# Patient Record
Sex: Male | Born: 1955 | Race: White | Hispanic: No | Smoking: Never smoker
Health system: Southern US, Community
[De-identification: ages and names within clinical notes are randomized; demographics above are authoritative.]

## PROBLEM LIST (undated history)

## (undated) DIAGNOSIS — N401 Enlarged prostate with lower urinary tract symptoms: Secondary | ICD-10-CM

## (undated) DIAGNOSIS — I2510 Atherosclerotic heart disease of native coronary artery without angina pectoris: Secondary | ICD-10-CM

## (undated) DIAGNOSIS — Z87898 Personal history of other specified conditions: Secondary | ICD-10-CM

## (undated) DIAGNOSIS — E119 Type 2 diabetes mellitus without complications: Secondary | ICD-10-CM

## (undated) DIAGNOSIS — I240 Acute coronary thrombosis not resulting in myocardial infarction: Secondary | ICD-10-CM

## (undated) DIAGNOSIS — E785 Hyperlipidemia, unspecified: Secondary | ICD-10-CM

## (undated) DIAGNOSIS — I3139 Other pericardial effusion (noninflammatory): Secondary | ICD-10-CM

## (undated) DIAGNOSIS — D649 Anemia, unspecified: Secondary | ICD-10-CM

## (undated) DIAGNOSIS — Z973 Presence of spectacles and contact lenses: Secondary | ICD-10-CM

## (undated) DIAGNOSIS — I2699 Other pulmonary embolism without acute cor pulmonale: Secondary | ICD-10-CM

## (undated) DIAGNOSIS — R351 Nocturia: Secondary | ICD-10-CM

## (undated) DIAGNOSIS — N189 Chronic kidney disease, unspecified: Secondary | ICD-10-CM

## (undated) DIAGNOSIS — I313 Pericardial effusion (noninflammatory): Secondary | ICD-10-CM

## (undated) DIAGNOSIS — I4891 Unspecified atrial fibrillation: Secondary | ICD-10-CM

## (undated) DIAGNOSIS — K900 Celiac disease: Secondary | ICD-10-CM

## (undated) DIAGNOSIS — I82409 Acute embolism and thrombosis of unspecified deep veins of unspecified lower extremity: Secondary | ICD-10-CM

## (undated) DIAGNOSIS — I10 Essential (primary) hypertension: Secondary | ICD-10-CM

## (undated) DIAGNOSIS — G4730 Sleep apnea, unspecified: Secondary | ICD-10-CM

## (undated) DIAGNOSIS — D689 Coagulation defect, unspecified: Secondary | ICD-10-CM

## (undated) DIAGNOSIS — N289 Disorder of kidney and ureter, unspecified: Secondary | ICD-10-CM

## (undated) DIAGNOSIS — K589 Irritable bowel syndrome without diarrhea: Secondary | ICD-10-CM

## (undated) DIAGNOSIS — E7800 Pure hypercholesterolemia, unspecified: Secondary | ICD-10-CM

## (undated) DIAGNOSIS — I480 Paroxysmal atrial fibrillation: Secondary | ICD-10-CM

## (undated) HISTORY — DX: Type 2 diabetes mellitus without complications: E11.9

## (undated) HISTORY — PX: BYPASS GRAFT: SHX909

## (undated) HISTORY — DX: Coagulation defect, unspecified: D68.9

## (undated) HISTORY — DX: Pure hypercholesterolemia, unspecified: E78.00

## (undated) HISTORY — PX: MELANOMA EXCISION: SHX5266

## (undated) HISTORY — DX: Essential (primary) hypertension: I10

## (undated) HISTORY — DX: Paroxysmal atrial fibrillation: I48.0

## (undated) HISTORY — DX: Anemia, unspecified: D64.9

## (undated) HISTORY — PX: HX HERNIA REPAIR: SHX51

## (undated) HISTORY — PX: HX LIPOMA RESECTION: SHX23

## (undated) HISTORY — DX: Pericardial effusion (noninflammatory): I31.3

## (undated) HISTORY — DX: Unspecified atrial fibrillation: I48.91

## (undated) HISTORY — PX: HX HEART CATHETERIZATION: SHX148

## (undated) HISTORY — DX: Benign prostatic hyperplasia with lower urinary tract symptoms: R35.1

## (undated) HISTORY — PX: HX CORONARY ARTERY BYPASS GRAFT: SHX141

## (undated) HISTORY — DX: Celiac disease: K90.0

## (undated) HISTORY — DX: Sleep apnea, unspecified: G47.30

## (undated) HISTORY — DX: Acute embolism and thrombosis of unspecified deep veins of unspecified lower extremity: I82.409

## (undated) HISTORY — DX: Other pericardial effusion (noninflammatory): I31.39

## (undated) HISTORY — DX: Hyperlipidemia, unspecified: E78.5

## (undated) HISTORY — DX: Other pulmonary embolism without acute cor pulmonale: I26.99

## (undated) HISTORY — DX: Chronic kidney disease, unspecified: N18.9

## (undated) HISTORY — DX: Nocturia: N40.1

## (undated) HISTORY — DX: Acute coronary thrombosis not resulting in myocardial infarction: I24.0

## (undated) HISTORY — PX: HX OTHER: 2100001105

## (undated) HISTORY — PX: HX GALL BLADDER SURGERY/CHOLE: SHX55

## (undated) HISTORY — PX: CORONARY ARTERY BYPASS GRAFT: SHX141

## (undated) HISTORY — PX: CHOLECYSTECTOMY: SHX55

## (undated) SURGERY — CORONARY ANGIOGRAPHY W/LEFT HEART CATH W/WO LVG

---

## 2009-04-21 ENCOUNTER — Other Ambulatory Visit: Payer: Self-pay

## 2009-04-22 LAB — HISTORICAL SURGICAL PATHOLOGY SPECIMEN

## 2011-07-22 ENCOUNTER — Ambulatory Visit (HOSPITAL_COMMUNITY): Payer: Self-pay | Admitting: Family Medicine

## 2011-07-22 ENCOUNTER — Other Ambulatory Visit: Payer: Self-pay

## 2011-07-26 LAB — HISTORICAL SURGICAL PATHOLOGY SPECIMEN

## 2012-10-10 ENCOUNTER — Other Ambulatory Visit: Payer: Self-pay

## 2012-10-13 LAB — HISTORICAL SURGICAL PATHOLOGY SPECIMEN

## 2013-06-21 HISTORY — PX: SKIN CANCER EXCISION: SHX779

## 2015-12-16 HISTORY — PX: HX HEART SURGERY: 2100001149

## 2015-12-26 DIAGNOSIS — I2510 Atherosclerotic heart disease of native coronary artery without angina pectoris: Secondary | ICD-10-CM | POA: Insufficient documentation

## 2015-12-26 DIAGNOSIS — I8290 Acute embolism and thrombosis of unspecified vein: Secondary | ICD-10-CM | POA: Insufficient documentation

## 2015-12-26 DIAGNOSIS — K900 Celiac disease: Secondary | ICD-10-CM | POA: Insufficient documentation

## 2015-12-30 DIAGNOSIS — G8918 Other acute postprocedural pain: Secondary | ICD-10-CM | POA: Insufficient documentation

## 2015-12-30 HISTORY — PX: CORONARY ARTERY BYPASS GRAFT: SHX141

## 2015-12-30 HISTORY — DX: Other acute postprocedural pain: G89.18

## 2015-12-31 DIAGNOSIS — J9811 Atelectasis: Secondary | ICD-10-CM

## 2015-12-31 HISTORY — DX: Atelectasis: J98.11

## 2016-01-02 DIAGNOSIS — M1A9XX0 Chronic gout, unspecified, without tophus (tophi): Secondary | ICD-10-CM | POA: Insufficient documentation

## 2016-01-02 DIAGNOSIS — N400 Enlarged prostate without lower urinary tract symptoms: Secondary | ICD-10-CM | POA: Insufficient documentation

## 2016-01-02 HISTORY — DX: Chronic gout, unspecified, without tophus (tophi): M1A.9XX0

## 2016-01-13 ENCOUNTER — Inpatient Hospital Stay (EMERGENCY_DEPARTMENT_HOSPITAL)
Admission: EM | Admit: 2016-01-13 | Discharge: 2016-01-13 | Disposition: A | Discharge status: Home / Self Care | Payer: BC Managed Care – PPO

## 2016-01-13 DIAGNOSIS — I10 Essential (primary) hypertension: Secondary | ICD-10-CM

## 2016-01-13 DIAGNOSIS — R42 Dizziness and giddiness: Secondary | ICD-10-CM

## 2016-01-13 DIAGNOSIS — E860 Dehydration: Secondary | ICD-10-CM

## 2016-01-13 HISTORY — PX: OTHER SURGICAL HISTORY: SHX170

## 2016-01-16 DIAGNOSIS — I313 Pericardial effusion (noninflammatory): Secondary | ICD-10-CM | POA: Insufficient documentation

## 2016-01-16 DIAGNOSIS — I3139 Other pericardial effusion (noninflammatory): Secondary | ICD-10-CM | POA: Insufficient documentation

## 2016-01-16 DIAGNOSIS — I10 Essential (primary) hypertension: Secondary | ICD-10-CM | POA: Insufficient documentation

## 2016-09-19 ENCOUNTER — Ambulatory Visit
Admission: RE | Admit: 2016-09-19 | Discharge: 2016-09-19 | Disposition: A | Discharge status: Home / Self Care | Payer: BC Managed Care – PPO | Source: Ambulatory Visit | Attending: Cardiovascular Disease | Admitting: Cardiovascular Disease

## 2016-09-19 ENCOUNTER — Ambulatory Visit (HOSPITAL_BASED_OUTPATIENT_CLINIC_OR_DEPARTMENT_OTHER): Payer: BC Managed Care – PPO

## 2016-09-19 ENCOUNTER — Ambulatory Visit (INDEPENDENT_AMBULATORY_CARE_PROVIDER_SITE_OTHER): Payer: Self-pay | Admitting: Cardiovascular Disease

## 2016-09-19 DIAGNOSIS — R0602 Shortness of breath: Secondary | ICD-10-CM | POA: Insufficient documentation

## 2016-09-19 DIAGNOSIS — R5383 Other fatigue: Secondary | ICD-10-CM

## 2016-09-19 DIAGNOSIS — Z86711 Personal history of pulmonary embolism: Secondary | ICD-10-CM

## 2016-09-19 DIAGNOSIS — E611 Iron deficiency: Secondary | ICD-10-CM

## 2016-09-19 LAB — CBC
HCT: 44.5 % (ref 42.0–52.0)
HGB: 14.9 g/dL (ref 14.0–18.0)
MCH: 27.8 pg (ref 27.0–31.0)
MCHC: 33.5 g/dL (ref 33.0–37.0)
MCV: 83 fL (ref 80.0–94.0)
PLATELETS: 167 x10ˆ3/uL (ref 130–400)
RBC: 5.37 10*6/uL (ref 4.70–6.10)
RDW: 20.5 % — ABNORMAL HIGH (ref 11.5–14.5)
WBC: 5.6 x10ˆ3/uL (ref 4.8–10.8)

## 2016-09-19 LAB — COMPREHENSIVE METABOLIC PANEL, NON-FASTING
ALBUMIN: 4.3 g/dL (ref 3.6–4.8)
ALKALINE PHOSPHATASE: 59 U/L (ref 38–126)
ALT (SGPT): 45 U/L (ref 3–45)
ANION GAP: 12 mmol/L
AST (SGOT): 31 U/L (ref 7–56)
AST (SGOT): 31 U/L (ref 7–56)
BILIRUBIN TOTAL: <0.1 mg/dL — ABNORMAL LOW (ref 0.2–1.3)
BUN/CREA RATIO: 13
BUN: 18 mg/dL (ref 9–21)
CALCIUM: 9.7 mg/dL (ref 8.5–10.3)
CHLORIDE: 107 mmol/L (ref 101–111)
CO2 TOTAL: 26 mmol/L (ref 22–31)
CREATININE: 1.4 mg/dL (ref 0.70–1.40)
ESTIMATED GFR: 52 mL/min/1.73mˆ2 — ABNORMAL LOW (ref >=60)
GLUCOSE: 93 mg/dL (ref 68–99)
POTASSIUM: 4.8 mmol/L (ref 3.6–5.0)
PROTEIN TOTAL: 6.9 g/dL (ref 6.2–8.0)
SODIUM: 145 mmol/L (ref 137–145)

## 2016-09-19 LAB — IRON: IRON: 84 ug/dL (ref 50–181)

## 2016-09-19 LAB — D-DIMER: D-DIMER: >200 ng/mL DDU

## 2016-09-19 LAB — MAGNESIUM: MAGNESIUM: 1.9 mg/dL (ref 1.7–2.2)

## 2016-09-19 NOTE — Telephone Encounter (Signed)
Spouse called states pt has increased shortness of breath, fatigue, and exhaustion. He mowed the neighbor's yard and could not finish. That is new for him.  I spoke with Dr Cornelia Copa. He gave me orders for labs and chest x-ray. Spouse notified.

## 2016-09-20 ENCOUNTER — Ambulatory Visit (INDEPENDENT_AMBULATORY_CARE_PROVIDER_SITE_OTHER): Payer: BC Managed Care – PPO

## 2016-09-20 DIAGNOSIS — I2699 Other pulmonary embolism without acute cor pulmonale: Secondary | ICD-10-CM

## 2016-09-20 NOTE — Progress Notes (Signed)
[facility]  [address]  [place] [address]  Phone: [phone]  Fax: [phone]      Name: Dustin Webster  MRN: [id number]  DOB: Aug 20, 1956      Today's Date:09/20/2016    FlowSheet Documentation:    Anticoag Indication: Peripheral Artery embolism       Date INR drawn: 09/20/16   Current warfarin dosage: 10 mg qd x 8 mg  tu-th-sat       PT: XS   INR: 2.9   New warfarin dosage: SAME       Follow up/ Next visit: 1 MONTH   Initials: [facility]/LPN   Goal INR: 2.0 - 3.0                 Comments: LAB RESULTS REVIEWS WITH LEE ANN RN. [place], LPN

## 2016-10-20 ENCOUNTER — Ambulatory Visit (INDEPENDENT_AMBULATORY_CARE_PROVIDER_SITE_OTHER): Payer: BC Managed Care – PPO

## 2016-10-20 DIAGNOSIS — I739 Peripheral vascular disease, unspecified: Secondary | ICD-10-CM

## 2016-10-20 NOTE — Progress Notes (Signed)
[facility]  [address]  [place] [address]  Phone: [phone]  Fax: [phone]      Name: ARCHIE SHEA  MRN: [id number]  DOB: 1956-06-21      Today's Date:10/20/2016    FlowSheet Documentation:    Anticoag Indication: Peripheral Artery embolism       Date INR drawn: 10/20/16   Current warfarin dosage: 10 mg qd x 8 mg tu-th-sat           INR: 3.6   New warfarin dosage: 2 mg   Comments: WILL GO BACK TO 500 MG   Follow up/ Next visit: 3 weeks   Initials: [facility]/LPN   Goal INR: 2.0 -3.0                 Comments:       Vicente Serene, LPN

## 2016-11-10 ENCOUNTER — Ambulatory Visit (INDEPENDENT_AMBULATORY_CARE_PROVIDER_SITE_OTHER): Payer: BC Managed Care – PPO

## 2016-11-10 DIAGNOSIS — Z7901 Long term (current) use of anticoagulants: Secondary | ICD-10-CM

## 2016-11-10 DIAGNOSIS — I82629 Acute embolism and thrombosis of deep veins of unspecified upper extremity: Secondary | ICD-10-CM

## 2016-11-10 DIAGNOSIS — I8290 Acute embolism and thrombosis of unspecified vein: Secondary | ICD-10-CM

## 2016-12-12 ENCOUNTER — Ambulatory Visit (INDEPENDENT_AMBULATORY_CARE_PROVIDER_SITE_OTHER): Payer: BC Managed Care – PPO

## 2016-12-12 DIAGNOSIS — I739 Peripheral vascular disease, unspecified: Secondary | ICD-10-CM

## 2016-12-12 NOTE — Progress Notes (Signed)
[facility]  [address]  [place] [address]  Phone: [phone]  Fax: [phone]      Name: Dustin Webster  MRN: [id number]  DOB: 05-23-1956      Today's Date:12/12/2016    FlowSheet Documentation:    Anticoag Indication: Peripheral Artery embolism       Date INR drawn: 12/12/16   Current warfarin dosage: 10 MG QD X 8 MG TU-TH-SAT           INR: 3.0   New warfarin dosage: SAME       Follow up/ Next visit: 1 MONTH   Initials: [facility]/LPN   Goal INR: 4.6-9.9                 Comments:       Vicente Serene, LPN

## 2017-01-10 ENCOUNTER — Encounter (INDEPENDENT_AMBULATORY_CARE_PROVIDER_SITE_OTHER): Payer: Self-pay

## 2017-01-12 ENCOUNTER — Encounter (INDEPENDENT_AMBULATORY_CARE_PROVIDER_SITE_OTHER): Payer: BC Managed Care – PPO | Admitting: Cardiovascular Disease

## 2017-01-14 ENCOUNTER — Encounter (INDEPENDENT_AMBULATORY_CARE_PROVIDER_SITE_OTHER): Payer: Self-pay | Admitting: Cardiovascular Disease

## 2017-01-14 DIAGNOSIS — I2699 Other pulmonary embolism without acute cor pulmonale: Secondary | ICD-10-CM

## 2017-01-14 DIAGNOSIS — I2510 Atherosclerotic heart disease of native coronary artery without angina pectoris: Secondary | ICD-10-CM

## 2017-01-14 DIAGNOSIS — I313 Pericardial effusion (noninflammatory): Secondary | ICD-10-CM

## 2017-01-14 DIAGNOSIS — I4891 Unspecified atrial fibrillation: Secondary | ICD-10-CM

## 2017-01-14 DIAGNOSIS — I3139 Other pericardial effusion (noninflammatory): Secondary | ICD-10-CM

## 2017-01-14 DIAGNOSIS — I959 Hypotension, unspecified: Secondary | ICD-10-CM

## 2017-01-14 DIAGNOSIS — I10 Essential (primary) hypertension: Secondary | ICD-10-CM

## 2017-01-14 DIAGNOSIS — E782 Mixed hyperlipidemia: Secondary | ICD-10-CM | POA: Insufficient documentation

## 2017-01-16 ENCOUNTER — Other Ambulatory Visit (HOSPITAL_BASED_OUTPATIENT_CLINIC_OR_DEPARTMENT_OTHER): Payer: Self-pay | Admitting: UROLOGY

## 2017-01-16 ENCOUNTER — Encounter (INDEPENDENT_AMBULATORY_CARE_PROVIDER_SITE_OTHER): Payer: Self-pay | Admitting: Cardiovascular Disease

## 2017-01-16 ENCOUNTER — Ambulatory Visit (INDEPENDENT_AMBULATORY_CARE_PROVIDER_SITE_OTHER): Payer: BC Managed Care – PPO | Admitting: Cardiovascular Disease

## 2017-01-16 ENCOUNTER — Encounter (HOSPITAL_BASED_OUTPATIENT_CLINIC_OR_DEPARTMENT_OTHER): Payer: Self-pay | Admitting: UROLOGY

## 2017-01-16 ENCOUNTER — Other Ambulatory Visit (INDEPENDENT_AMBULATORY_CARE_PROVIDER_SITE_OTHER): Payer: Self-pay | Admitting: Cardiovascular Disease

## 2017-01-16 ENCOUNTER — Other Ambulatory Visit (INDEPENDENT_AMBULATORY_CARE_PROVIDER_SITE_OTHER): Payer: Self-pay

## 2017-01-16 VITALS — BP 122/90 | HR 72 | Resp 18 | Ht 75.0 in | Wt 245.0 lb

## 2017-01-16 DIAGNOSIS — I2699 Other pulmonary embolism without acute cor pulmonale: Secondary | ICD-10-CM

## 2017-01-16 DIAGNOSIS — Z6830 Body mass index (BMI) 30.0-30.9, adult: Secondary | ICD-10-CM

## 2017-01-16 DIAGNOSIS — I119 Hypertensive heart disease without heart failure: Secondary | ICD-10-CM

## 2017-01-16 DIAGNOSIS — I2510 Atherosclerotic heart disease of native coronary artery without angina pectoris: Secondary | ICD-10-CM

## 2017-01-16 DIAGNOSIS — N401 Enlarged prostate with lower urinary tract symptoms: Principal | ICD-10-CM

## 2017-01-16 DIAGNOSIS — E782 Mixed hyperlipidemia: Secondary | ICD-10-CM

## 2017-01-16 DIAGNOSIS — I10 Essential (primary) hypertension: Secondary | ICD-10-CM

## 2017-01-16 DIAGNOSIS — R351 Nocturia: Secondary | ICD-10-CM

## 2017-01-16 DIAGNOSIS — Z01818 Encounter for other preprocedural examination: Secondary | ICD-10-CM

## 2017-01-16 MED ORDER — AMLODIPINE 5 MG TABLET
5.0000 mg | ORAL_TABLET | Freq: Every day | ORAL | 4 refills | Status: DC
Start: 2017-01-16 — End: 2017-01-18

## 2017-01-16 NOTE — Progress Notes (Signed)
[facility]  [address]  [place] [address]  Phone: [phone]  Fax: [phone]    Encounter Date: 01/16/2017    Patient ID:  Dustin Webster  HUO:[id number]    DOB: [birth year]  Age: 61 y.o. male    Subjective:     Chief Complaint   Patient presents with   . Other     HPI:  History provided by patient.  This is a 61 year old white male who has a history of coronary artery disease status post coronary artery bypass grafting.  Following the surgery the patient was not doing well and had an echocardiogram in our office.  The patient was found to have a large hematoma that was impinging on his left atrium.  Patient was sent back to [facility] where he had surgery and had to have evacuation of the hematoma.  Patient's Coumadin was held at that time.  Patient then developed a pulmonary embolism.  Patient had an IVC filter placed by Dr. Hardie Lora.  The patient has been doing fairly well recently.  He does complain of increased fatigue.  He states this is actually been present since surgery was performed.  He does deny any recent angina, shortness of breath, palpitations, orthopnea, or PND.  Patient has declined statin therapy.  He states he may be open to injectable lipid-lowering agent.                                                              Current Outpatient Prescriptions   Medication Sig   . allopurinol (ZYLOPRIM) 100 mg Oral Tablet Once a day   . ascorbic acid, vitamin C, (VITAMIN C) 500 mg Oral Tablet 0.5 Tabs Twice daily   . aspirin (ECOTRIN) 81 mg Oral Tablet, Delayed Release (E.C.) Once a day   . Calcium Carbonate (OS-CAL) 650 mg calcium (1,625 mg) Oral Tablet Once a day   . ferrous sulfate (IRON) 325 mg (65 mg iron) Oral Tablet Twice daily   . KRILL OIL ORAL Once a day   . LUTEIN ORAL Once a day   . Magnesium 250 mg Oral Tablet Once a day   . metoprolol (LOPRESSOR) 25 mg Oral Tablet Twice daily   . multivitamin Oral Tablet Once a day   . pantoprazole (PROTONIX)  40 mg Oral Tablet, Delayed Release (E.C.) Once a day   . UBIDECARENONE (COQ-10 ORAL) Once a day   . warfarin (COUMADIN) 5 mg Oral Tablet Once a day As directed     Allergies   Allergen Reactions   . Flomax [Tamsulosin]    . Statins-Hmg-Coa Reductase Inhibitors      Past Medical History:   Diagnosis Date   . Anemia    . Atrial fibrillation ([place])    . Hypercholesterolemia    . Hypertension    . Pericardial effusion    . Pulmonary emboli [facility])                Family Medical History     None            Social History   Substance Use Topics   . Smoking status: Never Smoker   . Smokeless tobacco: Never Used   . Alcohol use Yes      Comment: rare  Review of Systems   Constitutional: Positive for fatigue. Negative for activity change, appetite change, chills and unexpected weight change.   HENT: Negative for ear pain, hearing loss and tinnitus.    Eyes: Negative for pain, redness and visual disturbance.   Respiratory: Negative for cough, chest tightness, shortness of breath, wheezing and stridor.    Cardiovascular: Negative for chest pain, palpitations and leg swelling.   Gastrointestinal: Negative for abdominal distention, abdominal pain, blood in stool, constipation, diarrhea, nausea and vomiting.   Endocrine: Negative for cold intolerance, heat intolerance, polydipsia, polyphagia and polyuria.   Genitourinary: Negative for difficulty urinating, dysuria, frequency and hematuria.   Musculoskeletal: Negative for arthralgias, back pain, myalgias and neck pain.   Neurological: Negative for dizziness, tremors, seizures, syncope, speech difficulty, light-headedness, numbness and headaches.   Psychiatric/Behavioral: Negative for agitation, confusion, dysphoric mood, hallucinations and sleep disturbance. The patient is not nervous/anxious and is not hyperactive.      Objective:   Vitals: BP 122/90 Comment: right  Pulse 72  Resp 18  Ht 1.905 m (6' 3" )  Wt 111.1 kg (245 lb)  BMI 30.62 kg/m2    Physical Exam    Constitutional: He is oriented to person, place, and time. He appears well-developed and well-nourished. No distress.   HENT:   Head: Normocephalic and atraumatic.   Right Ear: External ear normal.   Left Ear: External ear normal.   Eyes: Pupils are equal, round, and reactive to light. No scleral icterus.   Neck: Normal range of motion. Neck supple. No JVD present. No tracheal deviation present. No thyromegaly present.   Cardiovascular: Normal rate, regular rhythm, normal heart sounds and intact distal pulses.  Exam reveals no gallop and no friction rub.    No murmur heard.  Pulmonary/Chest: Effort normal and breath sounds normal. No stridor. No respiratory distress. He has no wheezes. He has no rales. He exhibits no tenderness.   Abdominal: Soft. He exhibits no distension and no mass. There is no tenderness. There is no rebound and no guarding.   Musculoskeletal: Normal range of motion. He exhibits no edema, tenderness or deformity.   Lymphadenopathy:     He has no cervical adenopathy.   Neurological: He is alert and oriented to person, place, and time. He has normal reflexes. No cranial nerve deficit. He exhibits normal muscle tone. Coordination normal.   Skin: Skin is warm and dry. No rash noted. He is not diaphoretic. No erythema. No pallor.   Psychiatric: He has a normal mood and affect.     Assessment & Plan:     ENCOUNTER DIAGNOSES     ICD-10-CM   1. Atherosclerosis of native coronary artery of native heart without angina pectoris I25.10   2. Essential hypertension I10   3. Mixed hyperlipidemia E78.2   4. Pulmonary emboli (HCC) I26.99       1.  Coronary artery disease status post coronary artery bypass graft:  The patient is doing well from this standpoint.  Patient does have a significant amount of fatigue which she has had present since surgery.  He was placed on beta-blocker at that time.  I am going to wean him off of his beta-blocker and we will place him on calcium channel blocker with Norvasc 5 mg  daily I have given him instructions on weaning off of the beta-blocker.  The patient declined statin therapy.  He states he may be open to a injectable lipid lowering agent if his insurance will pay for this.  Going  to have Leann look into this for him.    2.  History of pulmonary emboli:  Patient does have an IVC filter.  I discussed this with Dr. Hardie Lora.  Patient will have the IVC filter removed tomorrow.  The patient will hold his Coumadin tonight.  We have given him a box of Eliquis that he can take tomorrow evening for protection.  He will then take a booster dose of his Coumadin.    3.  Hyperlipidemia:  Again patient is open to possibly taking injectable lipid-lowering agent.  We are going to check with his insurance.    4.  Hypertension:  Patient will be weaned off of beta-blocker and we placed on Norvasc 5 mg daily.    We will see him back in 6 months sooner if needed.        Landry Corporal, MD

## 2017-01-17 ENCOUNTER — Encounter (HOSPITAL_COMMUNITY)
Admission: RE | Disposition: A | Discharge status: Home / Self Care | Payer: Self-pay | Source: Ambulatory Visit | Attending: Interventional Cardiology

## 2017-01-17 ENCOUNTER — Encounter (HOSPITAL_COMMUNITY): Payer: BC Managed Care – PPO | Admitting: Interventional Cardiology

## 2017-01-17 ENCOUNTER — Other Ambulatory Visit (INDEPENDENT_AMBULATORY_CARE_PROVIDER_SITE_OTHER): Payer: Self-pay

## 2017-01-17 ENCOUNTER — Inpatient Hospital Stay
Admission: RE | Admit: 2017-01-17 | Discharge: 2017-01-17 | Disposition: A | Discharge status: Home / Self Care | Payer: BC Managed Care – PPO | Source: Ambulatory Visit | Attending: Interventional Cardiology | Admitting: Interventional Cardiology

## 2017-01-17 DIAGNOSIS — Z95828 Presence of other vascular implants and grafts: Secondary | ICD-10-CM

## 2017-01-17 DIAGNOSIS — Z7982 Long term (current) use of aspirin: Secondary | ICD-10-CM | POA: Insufficient documentation

## 2017-01-17 DIAGNOSIS — I2782 Chronic pulmonary embolism: Secondary | ICD-10-CM

## 2017-01-17 DIAGNOSIS — I2510 Atherosclerotic heart disease of native coronary artery without angina pectoris: Secondary | ICD-10-CM | POA: Insufficient documentation

## 2017-01-17 DIAGNOSIS — Z951 Presence of aortocoronary bypass graft: Secondary | ICD-10-CM | POA: Insufficient documentation

## 2017-01-17 DIAGNOSIS — T790XXA Air embolism (traumatic), initial encounter: Secondary | ICD-10-CM

## 2017-01-17 DIAGNOSIS — E782 Mixed hyperlipidemia: Secondary | ICD-10-CM | POA: Insufficient documentation

## 2017-01-17 DIAGNOSIS — Z86711 Personal history of pulmonary embolism: Secondary | ICD-10-CM | POA: Insufficient documentation

## 2017-01-17 DIAGNOSIS — Z7901 Long term (current) use of anticoagulants: Secondary | ICD-10-CM | POA: Insufficient documentation

## 2017-01-17 DIAGNOSIS — I4891 Unspecified atrial fibrillation: Secondary | ICD-10-CM | POA: Insufficient documentation

## 2017-01-17 DIAGNOSIS — D649 Anemia, unspecified: Secondary | ICD-10-CM | POA: Insufficient documentation

## 2017-01-17 DIAGNOSIS — E119 Type 2 diabetes mellitus without complications: Secondary | ICD-10-CM | POA: Insufficient documentation

## 2017-01-17 DIAGNOSIS — Z4589 Encounter for adjustment and management of other implanted devices: Secondary | ICD-10-CM | POA: Insufficient documentation

## 2017-01-17 LAB — URINALYSIS, MACROSCOPIC
BILIRUBIN: NOT DETECTED mg/dL
BLOOD: NOT DETECTED mg/dL
GLUCOSE: NOT DETECTED mg/dL
KETONES: NOT DETECTED mg/dL
KETONES: NOT DETECTED mg/dL
LEUKOCYTES: NOT DETECTED WBCs/uL
NITRITE: NOT DETECTED
PH: 5 — ABNORMAL LOW (ref 5.0–8.0)
PROTEIN: NOT DETECTED mg/dL
SPECIFIC GRAVITY: 1.021 (ref 1.005–1.030)

## 2017-01-17 LAB — BASIC METABOLIC PANEL
ANION GAP: 11 mmol/L
BUN/CREA RATIO: 12
BUN: 13 mg/dL (ref 9–21)
CALCIUM: 9.9 mg/dL (ref 8.5–10.3)
CHLORIDE: 111 mmol/L (ref 101–111)
CO2 TOTAL: 24 mmol/L (ref 22–31)
CREATININE: 1.1 mg/dL (ref 0.70–1.40)
ESTIMATED GFR: 68 mL/min/1.73m?2 (ref >=60)
GLUCOSE: 114 mg/dL — ABNORMAL HIGH (ref 68–99)
POTASSIUM: 4.6 mmol/L (ref 3.6–5.0)
SODIUM: 146 mmol/L — ABNORMAL HIGH (ref 137–145)

## 2017-01-17 LAB — CBC
HCT: 47.7 % (ref 42.0–52.0)
HGB: 15.9 g/dL (ref 14.0–18.0)
MCH: 29.1 pg (ref 27.0–31.0)
MCHC: 33.3 g/dL (ref 33.0–37.0)
MCV: 87.1 fL (ref 80.0–94.0)
PLATELETS: 189 x10ˆ3/uL (ref 130–400)
RBC: 5.47 x10ˆ6/uL (ref 4.70–6.10)
RDW: 14.5 % (ref 11.5–14.5)
WBC: 6.8 x10ˆ3/uL (ref 4.8–10.8)

## 2017-01-17 LAB — PT/INR
INR: 1.58 (ref <=4.00)
PROTHROMBIN TIME: 18.2 s — ABNORMAL HIGH (ref 9.5–12.9)

## 2017-01-17 LAB — PTT (PARTIAL THROMBOPLASTIN TIME): APTT: 35.5 s (ref 26.0–39.0)

## 2017-01-17 SURGERY — CARD IVC FILTER REMOVAL

## 2017-01-17 MED ORDER — LIDOCAINE HCL 20 MG/ML (2 %) INJECTION SOLUTION
Freq: Once | INTRAMUSCULAR | Status: DC | PRN
Start: 2017-01-17 — End: 2017-01-17
  Administered 2017-01-17 (×2): 20 mL via INTRADERMAL

## 2017-01-17 MED ORDER — MIDAZOLAM 1 MG/ML INJECTION SOLUTION
INTRAMUSCULAR | Status: AC
Start: 2017-01-17 — End: 2017-01-17
  Filled 2017-01-17: qty 2

## 2017-01-17 MED ORDER — MIDAZOLAM 1 MG/ML INJECTION SOLUTION
Freq: Once | INTRAMUSCULAR | Status: DC | PRN
Start: 2017-01-17 — End: 2017-01-17
  Administered 2017-01-17: 1 mg via INTRAVENOUS
  Administered 2017-01-17: 2 mg via INTRAVENOUS

## 2017-01-17 MED ORDER — LIDOCAINE HCL 20 MG/ML (2 %) INJECTION SOLUTION
INTRAMUSCULAR | Status: AC
Start: 2017-01-17 — End: 2017-01-17
  Filled 2017-01-17: qty 20

## 2017-01-17 MED ORDER — HEPARIN (PORCINE) (PF) 2,000 UNIT/1,000 ML IN 0.9 % SODIUM CHLORIDE IV
Freq: Once | INTRAVENOUS | Status: DC | PRN
Start: 2017-01-17 — End: 2017-01-17
  Administered 2017-01-17: 2000 [IU]

## 2017-01-17 MED ORDER — SODIUM CHLORIDE 0.9 % INTRAVENOUS SOLUTION
INTRAVENOUS | Status: DC | PRN
Start: 2017-01-17 — End: 2017-01-17
  Administered 2017-01-17: 75 mL/h via INTRAVENOUS

## 2017-01-17 MED ORDER — HEPARIN (PORCINE) (PF) 2,000 UNIT/1,000 ML IN 0.9 % SODIUM CHLORIDE IV
INTRAVENOUS | Status: AC
Start: 2017-01-17 — End: 2017-01-17
  Filled 2017-01-17: qty 1000

## 2017-01-17 MED ORDER — IOPAMIDOL 370 MG IODINE/ML (76 %) INTRAVENOUS SOLUTION
Freq: Once | INTRAVENOUS | Status: DC | PRN
Start: 2017-01-17 — End: 2017-01-17
  Administered 2017-01-17: 11:00:00 15 mL

## 2017-01-17 SURGICAL SUPPLY — 19 items
20MM SNARE LOOP ×2 IMPLANT
CONV USE ITEM 338050 - PACK SURG CSTM CTH LB LF (CUSTOM TRAYS & PACK) ×1
GW INQWR .035IN 150CM FIX COR FINGER STRG PTFE VAS 3MM RAD STD J STRL LF (WIRE) ×1 IMPLANT
GW INQWR .035IN 150CM FIX COR_NGR STRG STD SHFT PTFE VAS 3 (WIRE) ×1
KIT SNARE BARD 20MM 2 SHEATH RTRVL KINK RST RADOPQ NITINOL (BALLOON) ×1 IMPLANT
KIT SNARE BARD 20MM 2 SHTH RTR_VL KINK RST RADOPQ NITINOL (BALLOON) ×1
NEEDLE 7CM 18GA WO BASEPLATE P_ROC PERC DISP TW VAS ACCESS (NEEDLES & SYRINGE SUPPLIES) ×2 IMPLANT
PACK HEART CATH_DYNJ38683A 6EA/CS (CUSTOM TRAYS & PACK) ×2
PACK SURG CSTM CTH LB LF (CUSTOM TRAYS & PACK) ×1 IMPLANT
SHEATH GD 10CM 8FR PINN .038IN COR SS SNAPON DIL LOCK KINK RST SMOOTH TRNS SPRG COIL GW 2.5CM (GUIDING) ×1 IMPLANT
SHEATH INTROD 10CM 6FR PINN COR SS HDRPH PTFE SNAPON DIL LOCK KINK RST SMOOTH TRNS 2.5CM ACPT .038IN (GUIDING) ×1 IMPLANT
SHEATH SUPER 6FR 11CM .035_RSS602 10EA/BX (GUIDING) ×1
SHEATH SUPER 8FR 11CM .035_RSS802 10EA/BX (GUIDING) ×1
SHIELD RAD 17X12IN X-DRP .25MM ABS XRY CTOUT LEADFREE OVRWRAP 35X35IN STRL DISP (PROTECTIVE PRODUCTS/GARMENTS) ×1 IMPLANT
SHIELD RAD 17X12IN X-DRP .25MM_ABS XRY CTOUT LEADFREE (PROTECTIVE PRODUCTS/GARMENTS) ×1
SOL IV 0.9% NACL 500ML PLASTIC CONTAINR VIAFLEX LF (SOLUTIONS) ×1 IMPLANT
SOLUTION IV NS INJ 500CC_2B1323Q 24/CS (SOLUTIONS) ×1
STOPCOCK ANGIO 400 PSI NAMIC 3W ROT ADPR LFT PORT STRL LF (WIRE) ×1 IMPLANT
STOPCOCK ANGIO 400 PSI NAMIC 3_W ROT ADPR LFT PORT STRL (WIRE) ×1

## 2017-01-17 NOTE — Nurses Notes (Signed)
Patient received back to cath lab holding; dressing to right side of neck intact and free of signs of bleeding; vital signs stable; patient drinking fluids; no c/o pain/needs at this time; will continue to monitor

## 2017-01-17 NOTE — Nurses Notes (Signed)
Blood drawn and collected, sent to Lab.

## 2017-01-17 NOTE — Nurses Notes (Signed)
Patient's IV removed and patient walked out to car by Ephriam Jenkins, RN  01/17/2017, 12:06

## 2017-01-17 NOTE — Nurses Notes (Signed)
Patient given discharge instructions by K Zaccheus Edmister RN.

## 2017-01-17 NOTE — Nurses Notes (Signed)
Pulses bilateral DP & PT 2+

## 2017-01-18 MED ORDER — AMLODIPINE 5 MG TABLET
5.0000 mg | ORAL_TABLET | Freq: Every day | ORAL | 4 refills | Status: DC
Start: 2017-01-18 — End: 2017-01-20

## 2017-01-20 ENCOUNTER — Other Ambulatory Visit (INDEPENDENT_AMBULATORY_CARE_PROVIDER_SITE_OTHER): Payer: Self-pay

## 2017-01-20 MED ORDER — AMLODIPINE 5 MG TABLET
5.0000 mg | ORAL_TABLET | Freq: Every day | ORAL | 3 refills | Status: DC
Start: 2017-01-20 — End: 2017-02-20

## 2017-01-23 ENCOUNTER — Ambulatory Visit (INDEPENDENT_AMBULATORY_CARE_PROVIDER_SITE_OTHER): Payer: BC Managed Care – PPO

## 2017-01-23 DIAGNOSIS — Z7901 Long term (current) use of anticoagulants: Secondary | ICD-10-CM

## 2017-01-23 DIAGNOSIS — I2699 Other pulmonary embolism without acute cor pulmonale: Secondary | ICD-10-CM

## 2017-01-24 ENCOUNTER — Encounter (INDEPENDENT_AMBULATORY_CARE_PROVIDER_SITE_OTHER): Payer: BC Managed Care – PPO

## 2017-01-26 ENCOUNTER — Ambulatory Visit (INDEPENDENT_AMBULATORY_CARE_PROVIDER_SITE_OTHER): Payer: Self-pay

## 2017-01-26 NOTE — Telephone Encounter (Signed)
Faxed records to company

## 2017-01-30 ENCOUNTER — Ambulatory Visit (INDEPENDENT_AMBULATORY_CARE_PROVIDER_SITE_OTHER): Payer: BC Managed Care – PPO

## 2017-01-30 DIAGNOSIS — Z7901 Long term (current) use of anticoagulants: Secondary | ICD-10-CM

## 2017-01-30 DIAGNOSIS — I739 Peripheral vascular disease, unspecified: Secondary | ICD-10-CM

## 2017-01-31 ENCOUNTER — Encounter (HOSPITAL_BASED_OUTPATIENT_CLINIC_OR_DEPARTMENT_OTHER): Payer: Self-pay | Admitting: UROLOGY

## 2017-02-20 ENCOUNTER — Ambulatory Visit (INDEPENDENT_AMBULATORY_CARE_PROVIDER_SITE_OTHER): Payer: Self-pay

## 2017-02-20 DIAGNOSIS — I10 Essential (primary) hypertension: Secondary | ICD-10-CM

## 2017-02-20 NOTE — Telephone Encounter (Signed)
Pt called states b/p is higher since changing medication.  132/90  138/98  140/87.  I spoke with Dr Cornelia Copa. Increase Norvasc to 10 mg daily.  Verbalized understanding.

## 2017-02-22 ENCOUNTER — Other Ambulatory Visit (INDEPENDENT_AMBULATORY_CARE_PROVIDER_SITE_OTHER): Payer: Self-pay

## 2017-02-22 DIAGNOSIS — I10 Essential (primary) hypertension: Secondary | ICD-10-CM

## 2017-02-22 MED ORDER — AMLODIPINE 10 MG TABLET
10.0000 mg | ORAL_TABLET | Freq: Every day | ORAL | 4 refills | Status: DC
Start: 2017-02-22 — End: 2018-05-11

## 2017-02-23 ENCOUNTER — Ambulatory Visit (INDEPENDENT_AMBULATORY_CARE_PROVIDER_SITE_OTHER): Payer: BC Managed Care – PPO

## 2017-02-23 DIAGNOSIS — Z7901 Long term (current) use of anticoagulants: Secondary | ICD-10-CM

## 2017-02-23 DIAGNOSIS — I2699 Other pulmonary embolism without acute cor pulmonale: Secondary | ICD-10-CM

## 2017-02-24 ENCOUNTER — Encounter (INDEPENDENT_AMBULATORY_CARE_PROVIDER_SITE_OTHER): Payer: BC Managed Care – PPO

## 2017-03-13 ENCOUNTER — Ambulatory Visit (INDEPENDENT_AMBULATORY_CARE_PROVIDER_SITE_OTHER): Payer: BC Managed Care – PPO

## 2017-03-13 DIAGNOSIS — Z7901 Long term (current) use of anticoagulants: Secondary | ICD-10-CM

## 2017-03-13 DIAGNOSIS — I2699 Other pulmonary embolism without acute cor pulmonale: Secondary | ICD-10-CM

## 2017-04-03 ENCOUNTER — Ambulatory Visit (INDEPENDENT_AMBULATORY_CARE_PROVIDER_SITE_OTHER): Payer: BC Managed Care – PPO

## 2017-04-03 DIAGNOSIS — I2699 Other pulmonary embolism without acute cor pulmonale: Secondary | ICD-10-CM

## 2017-04-03 DIAGNOSIS — Z7901 Long term (current) use of anticoagulants: Secondary | ICD-10-CM

## 2017-05-04 ENCOUNTER — Ambulatory Visit (INDEPENDENT_AMBULATORY_CARE_PROVIDER_SITE_OTHER): Payer: BC Managed Care – PPO

## 2017-05-04 DIAGNOSIS — I2699 Other pulmonary embolism without acute cor pulmonale: Secondary | ICD-10-CM

## 2017-05-04 DIAGNOSIS — Z7901 Long term (current) use of anticoagulants: Secondary | ICD-10-CM

## 2017-05-19 ENCOUNTER — Ambulatory Visit (INDEPENDENT_AMBULATORY_CARE_PROVIDER_SITE_OTHER): Payer: BC Managed Care – PPO

## 2017-05-19 DIAGNOSIS — I2699 Other pulmonary embolism without acute cor pulmonale: Secondary | ICD-10-CM

## 2017-05-19 DIAGNOSIS — Z7901 Long term (current) use of anticoagulants: Secondary | ICD-10-CM

## 2017-05-26 ENCOUNTER — Ambulatory Visit (INDEPENDENT_AMBULATORY_CARE_PROVIDER_SITE_OTHER): Payer: BC Managed Care – PPO

## 2017-05-26 DIAGNOSIS — Z7901 Long term (current) use of anticoagulants: Secondary | ICD-10-CM

## 2017-05-26 DIAGNOSIS — I2699 Other pulmonary embolism without acute cor pulmonale: Secondary | ICD-10-CM

## 2017-06-16 ENCOUNTER — Encounter (INDEPENDENT_AMBULATORY_CARE_PROVIDER_SITE_OTHER): Payer: Self-pay

## 2017-06-21 ENCOUNTER — Encounter (INDEPENDENT_AMBULATORY_CARE_PROVIDER_SITE_OTHER): Payer: Self-pay

## 2017-06-26 ENCOUNTER — Ambulatory Visit (INDEPENDENT_AMBULATORY_CARE_PROVIDER_SITE_OTHER): Payer: BC Managed Care – PPO

## 2017-06-26 DIAGNOSIS — I2699 Other pulmonary embolism without acute cor pulmonale: Secondary | ICD-10-CM

## 2017-06-26 DIAGNOSIS — Z7901 Long term (current) use of anticoagulants: Secondary | ICD-10-CM

## 2017-07-21 ENCOUNTER — Encounter (INDEPENDENT_AMBULATORY_CARE_PROVIDER_SITE_OTHER): Payer: Self-pay | Admitting: Cardiovascular Disease

## 2017-08-09 ENCOUNTER — Encounter (INDEPENDENT_AMBULATORY_CARE_PROVIDER_SITE_OTHER): Payer: Self-pay | Admitting: Cardiovascular Disease

## 2017-08-10 ENCOUNTER — Ambulatory Visit (INDEPENDENT_AMBULATORY_CARE_PROVIDER_SITE_OTHER): Payer: BC Managed Care – PPO | Admitting: Cardiovascular Disease

## 2017-08-10 ENCOUNTER — Encounter (INDEPENDENT_AMBULATORY_CARE_PROVIDER_SITE_OTHER): Payer: Self-pay | Admitting: Cardiovascular Disease

## 2017-08-10 VITALS — BP 138/88 | HR 68 | Ht 74.6 in | Wt 234.2 lb

## 2017-08-10 DIAGNOSIS — I10 Essential (primary) hypertension: Secondary | ICD-10-CM

## 2017-08-10 DIAGNOSIS — E782 Mixed hyperlipidemia: Secondary | ICD-10-CM

## 2017-08-10 DIAGNOSIS — I2699 Other pulmonary embolism without acute cor pulmonale: Secondary | ICD-10-CM

## 2017-08-10 DIAGNOSIS — Z6829 Body mass index (BMI) 29.0-29.9, adult: Secondary | ICD-10-CM

## 2017-08-10 DIAGNOSIS — I2510 Atherosclerotic heart disease of native coronary artery without angina pectoris: Principal | ICD-10-CM

## 2017-08-10 MED ORDER — TAMSULOSIN 0.4 MG CAPSULE
0.4000 mg | ORAL_CAPSULE | Freq: Every evening | ORAL | 5 refills | Status: DC
Start: 2017-08-10 — End: 2017-08-11

## 2017-08-10 MED ORDER — FENOFIBRATE NANOCRYSTALLIZED 145 MG TABLET
145.0000 mg | ORAL_TABLET | Freq: Every morning | ORAL | 5 refills | Status: DC
Start: 2017-08-10 — End: 2017-08-11

## 2017-08-10 NOTE — Progress Notes (Signed)
[facility]  [place] [address]  Phone: [phone]  Fax: [phone]    Encounter Date: 08/10/2017    Patient ID:  Dustin Webster  HYI:[id number]    DOB: Mar 13, 1956  Age: 61 y.o. male    Subjective:     Chief Complaint   Patient presents with   . Annual Exam     HPI:  History provided by patient.  This is a 61 year old white male who I follow in the office.  Patient has a history of coronary artery disease status post coronary artery bypass grafting.  The patient following this had a complication of a hematoma impinging on the left atrium.  Patient was sent back to [facility] and had urgent surgery.  The patient's Coumadin was stopped and the patient developed a pulmonary embolism.  He did have a IVC filter placed.  This has since been removed and the patient is on Coumadin currently.  He does deny any bleeding with the Coumadin.  The patient is having a lot of issues with have any tamponade Miller night to urinate.  He was on Flomax prior to his surgical procedure was asking go back on this.  I had stopped his beta-blocker due to overwhelming fatigue any feels great improvement after stopping this.  The patient does deny any chest pain, shortness of breath, palpitations, orthopnea, or PND.      Current Outpatient Prescriptions   Medication Sig   . allopurinol (ZYLOPRIM) 100 mg Oral Tablet Once a day   . amLODIPine (NORVASC) 10 mg Oral Tablet Take 1 Tab (10 mg total) by mouth Once a day   . ascorbic acid, vitamin C, (VITAMIN C) 500 mg Oral Tablet 0.5 Tabs Twice daily   . aspirin (ECOTRIN) 81 mg Oral Tablet, Delayed Release (E.C.) Once a day   . Calcium Carbonate (OS-CAL) 650 mg calcium (1,625 mg) Oral Tablet Once a day   . ferrous sulfate (IRON) 325 mg (65 mg iron) Oral Tablet Twice daily   . Fluocinonide (LIDEX) 0.05 % Gel As directed   . hydrocortisone 2.5 % Cream As directed   . KRILL OIL ORAL Once a day   . LUTEIN ORAL Once a day   .  Magnesium 250 mg Oral Tablet Once a day   . melatonin 1 mg Oral Tablet As directed   . multivitamin Oral Tablet Once a day   . pantoprazole (PROTONIX) 40 mg Oral Tablet, Delayed Release (E.C.) Once a day   . UBIDECARENONE (COQ-10 ORAL) Once a day   . warfarin (COUMADIN) 5 mg Oral Tablet Once a day As directed     Allergies   Allergen Reactions   . Flomax [Tamsulosin]    . Statins-Hmg-Coa Reductase Inhibitors      Past Medical History:   Diagnosis Date   . Acute lower UTI    . Anemia    . Anemia    . Atrial fibrillation (CMS HCC)    . BPH associated with nocturia    . CKD (chronic kidney disease)    . DVT (deep venous thrombosis) (CMS HCC)    . Hypercholesterolemia    . Hyperlipidemia    . Hypertension    . Hypertrophy of prostate    . Nocturia    . Pericardial effusion    . Pulmonary emboli (CMS [facility])          Past Surgical History:   Procedure Laterality Date   . HX CHOLECYSTECTOMY     .  HX HEART SURGERY  12/2015   . HX HERNIA REPAIR           Family Medical History:     Problem Relation (Age of Onset)    Emphysema Father    Heart Attack General Family Hx    Heart Disease Mother    Hypertension Mother    Stroke Mother            Social History   Substance Use Topics   . Smoking status: Never Smoker   . Smokeless tobacco: Never Used   . Alcohol use Yes      Comment: rare       Review of Systems   Constitutional: Negative for activity change, appetite change, chills, fatigue and unexpected weight change.   HENT: Negative for ear pain, hearing loss and tinnitus.    Eyes: Negative for pain, redness and visual disturbance.   Respiratory: Negative for cough, chest tightness, shortness of breath, wheezing and stridor.    Cardiovascular: Negative for chest pain, palpitations and leg swelling.   Gastrointestinal: Negative for abdominal distention, abdominal pain, blood in stool, constipation, diarrhea, nausea and vomiting.   Endocrine: Positive for polyuria. Negative for cold intolerance, heat intolerance, polydipsia and  polyphagia.   Genitourinary: Negative for difficulty urinating, dysuria, frequency and hematuria.   Musculoskeletal: Negative for arthralgias, back pain, myalgias and neck pain.   Neurological: Negative for dizziness, tremors, seizures, syncope, speech difficulty, light-headedness, numbness and headaches.   Psychiatric/Behavioral: Negative for agitation, confusion, dysphoric mood, hallucinations and sleep disturbance. The patient is not nervous/anxious and is not hyperactive.      Objective:   Vitals: BP 138/88 Comment: right arm  Pulse 68  Ht 1.895 m (6' 2.6")  Wt 106.2 kg (234 lb 3.2 oz)  HC 18 cm (7.09")  BMI 29.59 kg/m2    Physical Exam   Constitutional: He is oriented to person, place, and time. He appears well-developed and well-nourished. No distress.   HENT:   Head: Normocephalic and atraumatic.   Right Ear: External ear normal.   Left Ear: External ear normal.   Eyes: Pupils are equal, round, and reactive to light. No scleral icterus.   Neck: Normal range of motion. Neck supple. No JVD present. No tracheal deviation present. No thyromegaly present.   Cardiovascular: Normal rate, regular rhythm, normal heart sounds and intact distal pulses.  Exam reveals no gallop and no friction rub.    No murmur heard.  Pulmonary/Chest: Effort normal and breath sounds normal. No stridor. No respiratory distress. He has no wheezes. He has no rales. He exhibits no tenderness.   Abdominal: Soft. He exhibits no distension and no mass. There is no tenderness. There is no rebound and no guarding.   Musculoskeletal: Normal range of motion. He exhibits no edema, tenderness or deformity.   Lymphadenopathy:     He has no cervical adenopathy.   Neurological: He is alert and oriented to person, place, and time. He has normal reflexes. No cranial nerve deficit. He exhibits normal muscle tone. Coordination normal.   Skin: Skin is warm and dry. No rash noted. He is not diaphoretic. No erythema. No pallor.   Psychiatric: He has a  normal mood and affect.     Assessment & Plan:     ENCOUNTER DIAGNOSES     ICD-10-CM   1. Atherosclerosis of native coronary artery of native heart without angina pectoris I25.10   2. Pulmonary emboli (CMS HCC) I26.99   3. Mixed hyperlipidemia E78.2  4. Essential hypertension I10       1.  Coronary artery disease status post coronary artery bypass graft:  The patient is doing well at this time with no significant angina.  He will continue with risk factor modification and medical therapy.  The patient has had elevation in his liver function test the past and also is unable to tolerate statins.  We had attempted to get him injectable medication but have been unsuccessful.  He does have elevated triglycerides and is willing to try tricor.  We will check his Coumadin in approximately 1 week due to the interaction with the Coumadin.    2.  History of pulmonary emboli:  Patient continue on Coumadin with a goal INR of 2-3.    3.  Hyperlipidemia:  As above    4.  Hypertension:  Patient continue on amlodipine I will decrease this down to 5 mg daily.  He would like to restart his Flomax 0.4 mg daily.    We will see the patient back in 6 months sooner if needed.        Landry Corporal, MD

## 2017-08-11 ENCOUNTER — Other Ambulatory Visit (INDEPENDENT_AMBULATORY_CARE_PROVIDER_SITE_OTHER): Payer: Self-pay

## 2017-08-11 MED ORDER — FENOFIBRATE NANOCRYSTALLIZED 145 MG TABLET
145.0000 mg | ORAL_TABLET | Freq: Every morning | ORAL | 3 refills | Status: DC
Start: 2017-08-11 — End: 2018-06-11

## 2017-08-11 MED ORDER — TAMSULOSIN 0.4 MG CAPSULE
0.4000 mg | ORAL_CAPSULE | Freq: Every evening | ORAL | 3 refills | Status: DC
Start: 2017-08-11 — End: 2017-08-17

## 2017-08-14 ENCOUNTER — Ambulatory Visit (INDEPENDENT_AMBULATORY_CARE_PROVIDER_SITE_OTHER): Payer: Self-pay

## 2017-08-17 ENCOUNTER — Other Ambulatory Visit (INDEPENDENT_AMBULATORY_CARE_PROVIDER_SITE_OTHER): Payer: Self-pay | Admitting: Cardiovascular Disease

## 2017-08-18 MED ORDER — TAMSULOSIN 0.4 MG CAPSULE
0.4000 mg | ORAL_CAPSULE | Freq: Every evening | ORAL | 3 refills | Status: DC
Start: 2017-08-18 — End: 2018-01-08

## 2017-08-22 ENCOUNTER — Ambulatory Visit (INDEPENDENT_AMBULATORY_CARE_PROVIDER_SITE_OTHER): Payer: BC Managed Care – PPO

## 2017-08-22 DIAGNOSIS — I2699 Other pulmonary embolism without acute cor pulmonale: Secondary | ICD-10-CM

## 2017-08-22 DIAGNOSIS — Z7901 Long term (current) use of anticoagulants: Secondary | ICD-10-CM

## 2017-08-22 NOTE — Nurses Notes (Signed)
FLOMAX 0.4 MG 1 PO QD 30 WITH 2 REFILLS CALLED TO LOCAL PHARMACY. THIS IS EXPERIMENTAL, PT HAS AGREED TO TRY. [facility]N

## 2017-09-11 ENCOUNTER — Encounter (INDEPENDENT_AMBULATORY_CARE_PROVIDER_SITE_OTHER): Payer: Self-pay

## 2017-09-22 ENCOUNTER — Ambulatory Visit (INDEPENDENT_AMBULATORY_CARE_PROVIDER_SITE_OTHER): Payer: BC Managed Care – PPO

## 2017-09-22 DIAGNOSIS — I2699 Other pulmonary embolism without acute cor pulmonale: Secondary | ICD-10-CM

## 2017-09-22 DIAGNOSIS — Z7901 Long term (current) use of anticoagulants: Secondary | ICD-10-CM

## 2017-10-04 ENCOUNTER — Ambulatory Visit (INDEPENDENT_AMBULATORY_CARE_PROVIDER_SITE_OTHER): Payer: BC Managed Care – PPO

## 2017-10-04 DIAGNOSIS — Z86711 Personal history of pulmonary embolism: Principal | ICD-10-CM

## 2017-10-04 DIAGNOSIS — Z7901 Long term (current) use of anticoagulants: Secondary | ICD-10-CM

## 2017-11-01 ENCOUNTER — Ambulatory Visit (INDEPENDENT_AMBULATORY_CARE_PROVIDER_SITE_OTHER): Payer: BC Managed Care – PPO

## 2017-11-01 DIAGNOSIS — I4891 Unspecified atrial fibrillation: Secondary | ICD-10-CM

## 2017-11-01 DIAGNOSIS — Z7901 Long term (current) use of anticoagulants: Secondary | ICD-10-CM

## 2017-11-16 ENCOUNTER — Ambulatory Visit (INDEPENDENT_AMBULATORY_CARE_PROVIDER_SITE_OTHER): Payer: BC Managed Care – PPO

## 2017-11-16 DIAGNOSIS — I2699 Other pulmonary embolism without acute cor pulmonale: Secondary | ICD-10-CM

## 2017-11-16 DIAGNOSIS — Z7901 Long term (current) use of anticoagulants: Secondary | ICD-10-CM

## 2017-11-24 ENCOUNTER — Ambulatory Visit (INDEPENDENT_AMBULATORY_CARE_PROVIDER_SITE_OTHER): Payer: BC Managed Care – PPO

## 2017-11-24 DIAGNOSIS — Z7901 Long term (current) use of anticoagulants: Secondary | ICD-10-CM

## 2017-11-24 DIAGNOSIS — Z0000 Encounter for general adult medical examination without abnormal findings: Secondary | ICD-10-CM

## 2017-12-07 ENCOUNTER — Ambulatory Visit (INDEPENDENT_AMBULATORY_CARE_PROVIDER_SITE_OTHER): Payer: BC Managed Care – PPO

## 2017-12-07 DIAGNOSIS — I2699 Other pulmonary embolism without acute cor pulmonale: Secondary | ICD-10-CM

## 2017-12-28 ENCOUNTER — Encounter (HOSPITAL_BASED_OUTPATIENT_CLINIC_OR_DEPARTMENT_OTHER): Payer: Self-pay

## 2017-12-29 ENCOUNTER — Ambulatory Visit (INDEPENDENT_AMBULATORY_CARE_PROVIDER_SITE_OTHER): Payer: BC Managed Care – PPO

## 2017-12-29 DIAGNOSIS — I2699 Other pulmonary embolism without acute cor pulmonale: Secondary | ICD-10-CM

## 2018-01-08 ENCOUNTER — Other Ambulatory Visit (HOSPITAL_BASED_OUTPATIENT_CLINIC_OR_DEPARTMENT_OTHER): Payer: Self-pay | Admitting: Cardiovascular Disease

## 2018-01-09 MED ORDER — TAMSULOSIN 0.4 MG CAPSULE
0.4000 mg | ORAL_CAPSULE | Freq: Every evening | ORAL | 3 refills | Status: DC
Start: 2018-01-09 — End: 2018-06-28

## 2018-01-26 ENCOUNTER — Encounter (HOSPITAL_BASED_OUTPATIENT_CLINIC_OR_DEPARTMENT_OTHER): Payer: Self-pay

## 2018-02-09 ENCOUNTER — Encounter (HOSPITAL_BASED_OUTPATIENT_CLINIC_OR_DEPARTMENT_OTHER): Payer: Self-pay | Admitting: Nurse Practitioner

## 2018-02-09 ENCOUNTER — Ambulatory Visit (INDEPENDENT_AMBULATORY_CARE_PROVIDER_SITE_OTHER): Payer: BC Managed Care – PPO | Admitting: Nurse Practitioner

## 2018-02-09 ENCOUNTER — Encounter (HOSPITAL_BASED_OUTPATIENT_CLINIC_OR_DEPARTMENT_OTHER): Payer: Self-pay | Admitting: Cardiovascular Disease

## 2018-02-09 VITALS — BP 120/84 | HR 76 | Resp 12 | Ht 74.75 in | Wt 231.8 lb

## 2018-02-09 DIAGNOSIS — I2510 Atherosclerotic heart disease of native coronary artery without angina pectoris: Secondary | ICD-10-CM

## 2018-02-09 DIAGNOSIS — R5383 Other fatigue: Secondary | ICD-10-CM

## 2018-02-09 DIAGNOSIS — I2699 Other pulmonary embolism without acute cor pulmonale: Secondary | ICD-10-CM

## 2018-02-09 DIAGNOSIS — I10 Essential (primary) hypertension: Secondary | ICD-10-CM

## 2018-02-09 DIAGNOSIS — I4891 Unspecified atrial fibrillation: Secondary | ICD-10-CM

## 2018-02-09 DIAGNOSIS — E782 Mixed hyperlipidemia: Secondary | ICD-10-CM

## 2018-02-09 DIAGNOSIS — I313 Pericardial effusion (noninflammatory): Secondary | ICD-10-CM

## 2018-02-09 DIAGNOSIS — I3139 Other pericardial effusion (noninflammatory): Secondary | ICD-10-CM

## 2018-02-09 MED ORDER — NITROGLYCERIN 0.4 MG SUBLINGUAL TABLET
0.4000 mg | SUBLINGUAL_TABLET | SUBLINGUAL | 3 refills | Status: DC | PRN
Start: 2018-02-09 — End: 2018-02-12

## 2018-02-09 NOTE — Patient Instructions (Addendum)
Continue same meds    NTG as needed for chest pain    If recurrent cp and no relief with ntg go to ED    If cp and relieves with NTG then contact office discuss    Consider renal artery ultrasound    Pt to forward lipid profile for review  If LDL still high will again try approval for [place]

## 2018-02-09 NOTE — Progress Notes (Signed)
[place]  [address] [place]  [place] [address]  [phone]    Date:   02/09/2018  Name: Dustin Webster  Age: 62 y.o.    REASON FOR VISIT:   Annual Exam    HISTORY OF PRESENTING ILLNESS:    Dustin Webster is a 62 y.o. male with history of coronary artery disease status post CABG x4 approximately 2 years ago.  He did develop postoperative atrial fibrillation was placed on anticoagulation and then postoperatively the patient developed a pericardial effusion creating tamponade symptoms and return to [facility] for evacuation of the pericardial fluid.  Unfortunately thereafter the patient was found to have PE and placed back on anticoagulation and remains on Coumadin.  He additionally has a history of hypertension and hyperlipidemia.  He was last seen here in the office in September of 2018 at which time he was stable from a cardiovascular standpoint.  He has been unable to take statin therapy in the past due to intolerance and myalgias.  He was previously denied Repatha.  Recently he has been seen in [place] for acute renal insufficiency undergoing a series of tests.  He question today if his amlodipine could be contributing to his renal insufficiency.  He recently became more active around his home and while he was working outside developed 1 episode of left chest discomfort which resolved spontaneously.  The patient states that this was un similar to his prior anginal symptoms which he described as a midsternal throbbing sensation prior to his CABG.  He did not admit to any relieving factors to the pain.        Current Outpatient Medications   Medication Sig   . allopurinol (ZYLOPRIM) 100 mg Oral Tablet Once a day   . amLODIPine (NORVASC) 10 mg Oral Tablet Take 1 Tab (10 mg total) by mouth Once a day   . ascorbic acid, vitamin C, (VITAMIN C) 500 mg Oral Tablet 0.5 Tabs Twice daily   . aspirin (ECOTRIN) 81 mg Oral Tablet, Delayed Release (E.C.) Once a day   . Calcium  Carbonate (OS-CAL) 650 mg calcium (1,625 mg) Oral Tablet Once a day   . citalopram (CELEXA) 10 mg Oral Tablet Take 10 mg by mouth Once a day   . fenofibrate nanocrystallized (TRICOR) 145 mg Oral Tablet Take 1 Tab (145 mg total) by mouth Every morning with breakfast (Patient not taking: Reported on 02/09/2018)   . ferrous sulfate (IRON) 325 mg (65 mg iron) Oral Tablet Twice daily   . Fluocinonide (LIDEX) 0.05 % Gel As directed   . hydrocortisone 2.5 % Cream As directed   . KRILL OIL ORAL Once a day   . LUTEIN ORAL Once a day   . Magnesium 250 mg Oral Tablet Once a day   . melatonin 1 mg Oral Tablet As directed   . multivitamin Oral Tablet Once a day   . nitroGLYCERIN (NITROSTAT) 0.4 mg Sublingual Tablet, Sublingual 1 Tab (0.4 mg total) by Sublingual route Every 5 minutes as needed for Chest pain for 3 doses over 15 minutes   . raNITIdine (ZANTAC) 150 mg Oral Tablet Take 150 mg by mouth Twice daily   . tamsulosin (FLOMAX) 0.4 mg Oral Capsule Take 1 Cap (0.4 mg total) by mouth Every evening after dinner   . UBIDECARENONE (COQ-10 ORAL) Once a day   . warfarin (COUMADIN) 5 mg Oral Tablet Once a day As directed     Allergies   Allergen Reactions   .  Flomax [Tamsulosin]    . Statins-Hmg-Coa Reductase Inhibitors      Past Medical History:   Diagnosis Date   . Acute lower UTI    . Anemia    . Anemia    . Atrial fibrillation (CMS HCC)    . BPH associated with nocturia    . CKD (chronic kidney disease)    . DVT (deep venous thrombosis) (CMS HCC)    . Hypercholesterolemia    . Hyperlipidemia    . Hypertension    . Hypertrophy of prostate    . Nocturia    . Pericardial effusion    . Pulmonary emboli (CMS [facility])          Past Surgical History:   Procedure Laterality Date   . HX CHOLECYSTECTOMY     . HX HEART SURGERY  12/2015   . HX HERNIA REPAIR           Family Medical History:     Problem Relation (Age of Onset)    Emphysema Father    Heart Attack General Family Hx    Heart Disease Mother    Hypertension Mother    Stroke Mother             Social History     Socioeconomic History   . Marital status: Married     Spouse name: Not on file   . Number of children: Not on file   . Years of education: Not on file   . Highest education level: Not on file   Occupational History   . Not on file   Social Needs   . Financial resource strain: Not on file   . Food insecurity:     Worry: Not on file     Inability: Not on file   . Transportation needs:     Medical: Not on file     Non-medical: Not on file   Tobacco Use   . Smoking status: Never Smoker   . Smokeless tobacco: Never Used   Substance and Sexual Activity   . Alcohol use: Yes     Comment: rare   . Drug use: Not on file   . Sexual activity: Not on file   Lifestyle   . Physical activity:     Days per week: Not on file     Minutes per session: Not on file   . Stress: Not on file   Relationships   . Social connections:     Talks on phone: Not on file     Gets together: Not on file     Attends religious service: Not on file     Active member of club or organization: Not on file     Attends meetings of clubs or organizations: Not on file     Relationship status: Not on file   . Intimate partner violence:     Fear of current or ex partner: Not on file     Emotionally abused: Not on file     Physically abused: Not on file     Forced sexual activity: Not on file   Other Topics Concern   . Not on file   Social History Narrative   . Not on file       REVIEW OF SYSTEMS:  Constitutional: No fevers, chills, malaise or abnormal weight loss.    HENT: No tinnitus, vertigo, hearing loss, ear pain, sinus drainage, nasal congestion or throat pain.  Eyes: No vision changes, diplopia, drainage,  pain  Respiratory: No cough, shortness of breath, wheezing  Cardiovascular: No chest pain, PND, orthopnea, palpitations, lower extremity edema.    Gastrointestinal: No abdominal pain, nausea, vomiting, diarrhea, constipation, melana or hematochezia   Endocrine: No polyuria, polydipsia, heat or cold intolerance.  Genitourinary: No  dysuria, urgency, frequency, hematuria, nocturia.  Musculoskeletal: No myalgias, arthralgias.  Skin: No rashes, suspicious lesions.  Neurological: No headaches, weakness, paresthesias  Hematological:  No bleeding, spontaneous bruising.  Psychiatric/Behavioral: No confusion, agitation, hallucinations, paranoia, delusions.    All other systems reviewed and are negative except as noted in [place].    PHYSICAL EXAMINATION:     Vitals reviewed. BP 120/84   Pulse 76   Resp 12   Ht 1.899 m (6' 2.75")   Wt 105.1 kg (231 lb 12.8 oz)   SpO2 98%   BMI 29.17 kg/m       Vitals Filed       02/09/2018 1138             BP:  120/84    Pulse:  76    Resp:  12          Constitutional: He is oriented to person, place, and time.  He appears well-developed and well-nourished.   HEENT:  Normocephalic, atraumatic.  TM's clear and intact bilaterally.  Canals clear.  Nose clear with drainage.  Normal appearing nasal mucosa.  Posterior pharynx clear without lesions or exudate. Mucous membranes moist without lesions.   Neck: Supple. Thyroid normal without enlargement or palpable nodules.  Trachea midline.  Cardiovascular: Normal rate and rhythm without murmurs, rubs or gallops.  No JVD.  No bruits.  No peripheral edema is noted.  Pulmonary/Chest: Lungs clear without wheezes, rales or rhonchi.  No chest wall tenderness or deformity.   Abdominal: Soft. Nontender, nondistended with normal bowel sounds in all quadrants. No hepatosplenomegaly. No guarding, rebound , abnormal masses.  Rectal exam deferred.  Musculoskeletal: Full and painless ROM in all joint groups.  Neurological: Alert and oriented x 4.  Cranial nerves 2-12 intact without defecits.  BUE's/BLE's.  Strength and sensation intact in all extremeties.   Skin: Skin is warm and dry. No rash or suspicious skin lesions noted.  Psychiatric:  Normal mood and affect. Speech is normal and behavior is normal. Judgment and thought content normal. Cognition and memory are normal.  Vascular:  Peripheral lower extremity pulses well felt.    ASSESSMENT:    ENCOUNTER DIAGNOSES     ICD-10-CM   1. Pulmonary emboli (CMS HCC) I26.99   2. Atrial fibrillation, unspecified type (CMS HCC) I48.91   3. Atherosclerosis of native coronary artery of native heart without angina pectoris I25.10   4. Mixed hyperlipidemia E78.2   5. Essential hypertension I10   6. Pericardial effusion I31.3   7. Fatigue, unspecified type R53.83         PLAN:  Mr. Thum is a 62 year old white male with history of CABG at [facility] followed by pericardial effusion requiring evacuation and thereafter developing PE requiring anticoagulation.    CAD-no recurrent anginal symptoms.  Patient recently did have 1 episode of left chest discomfort which was un similar to his prior angina which was felt to be more musculoskeletal in nature after working outside.  He was given nitroglycerin sublingual for recurrent anginal symptoms.  He was advised if his anginal symptoms occur to take nitroglycerin as prescribed and if unrelieved return to the emergency department.  He was also advised that should he develop any anginal  symptoms requiring nitroglycerin periodically to contact our office at which time ischemic evaluation ie.  Stress test would be considered.    Hypertension-controlled    Hyperlipidemia-continue tricor though currently being held due to renal testing due to acute renal insufficiency.  Patient intolerant of statin therapy in the past.  Advise patient to forward most recent lipid profile and attempt to get approval for Repatha would be reconsidered.    Acute renal insufficiency-follows in [place].  Patient was advised to possibly undergo a renal artery duplex to evaluate for renal artery stenosis given his history of coronary disease.    Paroxysmal AFib-maintaining sinus rhythm by clinical exam.  Continue Coumadin and report any signs or symptoms of bleeding to the office.    History of pulmonary emboli continue  anticoagulation.  Orders Placed This Encounter   . nitroGLYCERIN (NITROSTAT) 0.4 mg Sublingual Tablet, Sublingual       Return in about 3 months (around 05/12/2018) for Dr Cornelia Copa.    Electronically signed by     Cristal Deer, APRN  02/09/2018, 16:44

## 2018-02-12 ENCOUNTER — Other Ambulatory Visit (HOSPITAL_BASED_OUTPATIENT_CLINIC_OR_DEPARTMENT_OTHER): Payer: Self-pay | Admitting: Interventional Cardiology

## 2018-02-13 MED ORDER — NITROGLYCERIN 0.4 MG SUBLINGUAL TABLET
0.4000 mg | SUBLINGUAL_TABLET | SUBLINGUAL | 3 refills | Status: DC | PRN
Start: 2018-02-13 — End: 2020-07-23

## 2018-03-06 ENCOUNTER — Ambulatory Visit (INDEPENDENT_AMBULATORY_CARE_PROVIDER_SITE_OTHER): Payer: BC Managed Care – PPO

## 2018-03-06 DIAGNOSIS — I4891 Unspecified atrial fibrillation: Secondary | ICD-10-CM

## 2018-03-06 DIAGNOSIS — I2699 Other pulmonary embolism without acute cor pulmonale: Secondary | ICD-10-CM

## 2018-03-07 ENCOUNTER — Other Ambulatory Visit (HOSPITAL_BASED_OUTPATIENT_CLINIC_OR_DEPARTMENT_OTHER): Payer: BC Managed Care – PPO

## 2018-03-07 ENCOUNTER — Ambulatory Visit: Payer: BC Managed Care – PPO | Attending: UROLOGY | Admitting: UROLOGY

## 2018-03-07 ENCOUNTER — Encounter (HOSPITAL_BASED_OUTPATIENT_CLINIC_OR_DEPARTMENT_OTHER): Payer: Self-pay | Admitting: UROLOGY

## 2018-03-07 VITALS — BP 126/68 | Ht 75.0 in | Wt 228.8 lb

## 2018-03-07 DIAGNOSIS — N401 Enlarged prostate with lower urinary tract symptoms: Principal | ICD-10-CM

## 2018-03-07 DIAGNOSIS — I2699 Other pulmonary embolism without acute cor pulmonale: Secondary | ICD-10-CM

## 2018-03-07 DIAGNOSIS — R351 Nocturia: Secondary | ICD-10-CM

## 2018-03-07 DIAGNOSIS — N138 Other obstructive and reflux uropathy: Secondary | ICD-10-CM

## 2018-03-07 LAB — URINALYSIS, MACROSCOPIC
BILIRUBIN: NOT DETECTED mg/dL
BLOOD: NOT DETECTED mg/dL
GLUCOSE: NOT DETECTED mg/dL
KETONES: NOT DETECTED mg/dL
LEUKOCYTES: NOT DETECTED WBCs/uL
NITRITE: NOT DETECTED
PH: 6 (ref 5.0–8.0)
PROTEIN: NOT DETECTED mg/dL
SPECIFIC GRAVITY: 1.02 (ref 1.005–1.030)
UROBILINOGEN: 0.2 mg/dL

## 2018-03-07 NOTE — H&P (Signed)
[facility]  [address]  [place] [address]  [phone]    Date: 03/07/2018  Name: Dustin Webster  Age: 62 y.o.      Chief of Complaint:   Chief Complaint   Patient presents with   . Benign Prostatic Hypertrophy     Patient presents in office today for BPH. PSA was 0.220 on 11/29/17. Patient states he has trouble with dribbling frequency and urgency. He has some trouble with weak stream, nocturia x4, he had trouble at one time with flomax causing low BP but is taking it now and is not very effective.        Dustin Webster is a 62 y.o. male  who presents to the practice today for follow-up on his history of BPH and prostatism.  Since I saw him last he has had multiple medical problems including open heart surgery, pulmonary embolism, and a prolonged hospitalization.  He now complains of increasing prostatism with nocturia 4-6 times per night.  He also complains of weak stream hesitancy.  He is on Flomax but continues to have significant symptoms.        Patient Active Problem List    Diagnosis Date Noted   . BPH with obstruction/lower urinary tract symptoms 03/07/2018   . Pulmonary emboli (CMS HCC) 01/14/2017   . Mixed hyperlipidemia 01/14/2017   . Essential hypertension 01/14/2017   . Atherosclerotic heart disease of native coronary artery without angina pectoris 01/14/2017   . Pericardial effusion 01/14/2017   . Hypotension 01/14/2017         Past Medical History:   Diagnosis Date   . Acute lower UTI    . Anemia    . Anemia    . Atrial fibrillation (CMS HCC)    . BPH associated with nocturia    . CKD (chronic kidney disease)    . DVT (deep venous thrombosis) (CMS HCC)    . Hypercholesterolemia    . Hyperlipidemia    . Hypertension    . Hypertrophy of prostate    . Nocturia    . Pericardial effusion    . Pulmonary emboli (CMS [facility])          Past Surgical History:   Procedure Laterality Date   . HX CHOLECYSTECTOMY     . HX HEART SURGERY  12/2015   . HX HERNIA REPAIR                    Allergies   Allergen Reactions   . Statins-Hmg-Coa Reductase Inhibitors      Current Outpatient Medications   Medication Sig Dispense Refill   . allopurinol (ZYLOPRIM) 100 mg Oral Tablet Once a day     . amLODIPine (NORVASC) 10 mg Oral Tablet Take 1 Tab (10 mg total) by mouth Once a day 90 Tab 4   . ascorbic acid, vitamin C, (VITAMIN C) 500 mg Oral Tablet 0.5 Tabs Twice daily     . aspirin (ECOTRIN) 81 mg Oral Tablet, Delayed Release (E.C.) Once a day     . Calcium Carbonate (OS-CAL) 650 mg calcium (1,625 mg) Oral Tablet Once a day     . citalopram (CELEXA) 10 mg Oral Tablet Take 10 mg by mouth Once a day     . fenofibrate nanocrystallized (TRICOR) 145 mg Oral Tablet Take 1 Tab (145 mg total) by mouth Every morning with breakfast 90 Tab 3   . ferrous sulfate (IRON) 325 mg (65 mg iron)  Oral Tablet Twice daily     . Fluocinonide (LIDEX) 0.05 % Gel As directed     . hydrocortisone 2.5 % Cream As directed     . KRILL OIL ORAL Once a day     . krill-om-3-dha-epa-phospho-ast (KRILL OIL) 1,000-170-50-80 mg Oral Capsule Take by mouth     . LUTEIN ORAL Once a day     . Magnesium 250 mg Oral Tablet Once a day     . melatonin 1 mg Oral Tablet As directed     . multivitamin (CENTRUM SILVER) 400-250 mcg Oral Tablet, Chewable Take 1 Tab by mouth Once a day     . multivitamin Oral Tablet Once a day     . nitroGLYCERIN (NITROSTAT) 0.4 mg Sublingual Tablet, Sublingual 1 Tab (0.4 mg total) by Sublingual route Every 5 minutes as needed for Chest pain for 3 doses over 15 minutes 25 Tab 3   . pantoprazole (PROTONIX) 20 mg Oral Tablet, Delayed Release (E.C.) Take 40 mg by mouth Every morning before breakfast     . tamsulosin (FLOMAX) 0.4 mg Oral Capsule Take 1 Cap (0.4 mg total) by mouth Every evening after dinner 90 Cap 3   . UBIDECARENONE (COQ-10 ORAL) Once a day     . warfarin (COUMADIN) 5 mg Oral Tablet Once a day As directed       No current facility-administered medications for this visit.       Family Medical History:      Problem Relation (Age of Onset)    Emphysema Father    Heart Attack General Family Hx    Heart Disease Mother    Hypertension (High Blood Pressure) Mother    Stroke Mother            Social History     Socioeconomic History   . Marital status: Married     Spouse name: Not on file   . Number of children: Not on file   . Years of education: Not on file   . Highest education level: Not on file   Occupational History   . Not on file   Social Needs   . Financial resource strain: Not on file   . Food insecurity:     Worry: Not on file     Inability: Not on file   . Transportation needs:     Medical: Not on file     Non-medical: Not on file   Tobacco Use   . Smoking status: Never Smoker   . Smokeless tobacco: Never Used   Substance and Sexual Activity   . Alcohol use: Not Currently     Comment: rare   . Drug use: Not on file   . Sexual activity: Not on file   Lifestyle   . Physical activity:     Days per week: Not on file     Minutes per session: Not on file   . Stress: Not on file   Relationships   . Social connections:     Talks on phone: Not on file     Gets together: Not on file     Attends religious service: Not on file     Active member of club or organization: Not on file     Attends meetings of clubs or organizations: Not on file     Relationship status: Not on file   . Intimate partner violence:     Fear of current or ex partner: Not on file  Emotionally abused: Not on file     Physically abused: Not on file     Forced sexual activity: Not on file   Other Topics Concern   . Not on file   Social History Narrative   . Not on file        ROS: A 10 point review of systems is negative except for the HPI.    OBJECTIVE:   BP 126/68   Ht 1.905 m (6\' 3" )   Wt 103.8 kg (228 lb 12.8 oz)   BMI 28.60 kg/m        Body mass index is 28.6 kg/m.     Exam:    GENERAL: Alert and Oriented, No Apparent Distress  SKIN: Clear  HEENT: NC/AT  NECK: Supple  ABDOMEN: Soft, NT, Non-distended, No Masses  EXTERNAL GENITALIA: Both  testicles appear normal with no masses, Penis has no lesions  ANORECTAL EXAM:  Prostate 2+ symmetric nontender no nodules  EXTREMITIES: No clubbing, cyanosis, or edema    Lab Results   Component Value Date/Time    COLOR Yellow 03/07/2018 07:51 AM    SPECGRAVUR 1.020 03/07/2018 07:51 AM    PHURINE 6.0 03/07/2018 07:51 AM    PROTEIN Not Detected 03/07/2018 07:51 AM    GLUCOSE Not Detected 03/07/2018 07:51 AM    KETONES Not Detected 03/07/2018 07:51 AM    UROBILINOGEN 0.2  03/07/2018 07:51 AM    LEUKOCYTES Not Detected 03/07/2018 07:51 AM    NITRITE Not Detected 03/07/2018 07:51 AM    WBC 6.8 01/17/2017 08:57 AM    RBC 5.47 01/17/2017 08:57 AM    BILIRUBIN Not Detected 03/07/2018 07:51 AM    HGBURINE Not Detected 03/07/2018 07:51 AM          ASSESSMENT & PLAN:       ICD-10-CM    1. Nocturia R35.1    2. BPH with obstruction/lower urinary tract symptoms N40.1     N13.8    3. Pulmonary emboli (CMS HCC) I26.99      No follow-ups on file.    Schedule flexible cystoscopy-local.  Please give the patient a handout on TUMT.         This note may have been fully or partially generated using MModal Fluency Direct system, and there may be some incorrect words, spellings, and punctuation that were not noted in checking the note before saving.    Graylon Gunning, MD  03/07/2018, 08:48

## 2018-03-07 NOTE — Nursing Note (Signed)
Flex Cysto, Local scheduled for 03/12/18 @ [facility]. Preop instructions explained and given to pt. Instructed pt to please call if he has any question or concern. Pt v/u.--Deatra Canter

## 2018-03-12 DIAGNOSIS — N138 Other obstructive and reflux uropathy: Secondary | ICD-10-CM

## 2018-03-12 DIAGNOSIS — N3289 Other specified disorders of bladder: Secondary | ICD-10-CM

## 2018-03-12 DIAGNOSIS — N401 Enlarged prostate with lower urinary tract symptoms: Secondary | ICD-10-CM

## 2018-03-13 ENCOUNTER — Ambulatory Visit (HOSPITAL_BASED_OUTPATIENT_CLINIC_OR_DEPARTMENT_OTHER): Payer: Self-pay | Admitting: UROLOGY

## 2018-03-13 NOTE — Telephone Encounter (Signed)
Pam- Please schedule patient for TUMT -- 4.0 cm per Dr Leandro Reasoner. Thanks!! -- Ignacia Palma, RN

## 2018-03-21 ENCOUNTER — Ambulatory Visit (HOSPITAL_BASED_OUTPATIENT_CLINIC_OR_DEPARTMENT_OTHER): Payer: Self-pay | Admitting: UROLOGY

## 2018-03-21 DIAGNOSIS — N401 Enlarged prostate with lower urinary tract symptoms: Principal | ICD-10-CM

## 2018-03-21 DIAGNOSIS — N138 Other obstructive and reflux uropathy: Secondary | ICD-10-CM

## 2018-03-21 NOTE — Nursing Note (Signed)
Patient wife called our office regarding [facility]. Scheduled 05/24/18 @ 8:30am ( requested July). Appointment for instructions and Rx's scheduled 05/10/18@11am . 3-5 cm catheter in stock. No precert needed / office procedure. Voices understanding  Orie Fisherman, LPN.

## 2018-03-21 NOTE — Telephone Encounter (Signed)
Please sign order for [facility] / TUMT scheduled 05/24/18. Thank you  Orie Fisherman, LPN.

## 2018-04-05 ENCOUNTER — Ambulatory Visit (INDEPENDENT_AMBULATORY_CARE_PROVIDER_SITE_OTHER): Payer: BC Managed Care – PPO

## 2018-04-05 DIAGNOSIS — I2699 Other pulmonary embolism without acute cor pulmonale: Secondary | ICD-10-CM

## 2018-04-26 ENCOUNTER — Encounter (HOSPITAL_BASED_OUTPATIENT_CLINIC_OR_DEPARTMENT_OTHER): Payer: Self-pay

## 2018-04-30 ENCOUNTER — Ambulatory Visit (INDEPENDENT_AMBULATORY_CARE_PROVIDER_SITE_OTHER): Payer: BC Managed Care – PPO

## 2018-04-30 DIAGNOSIS — I2699 Other pulmonary embolism without acute cor pulmonale: Secondary | ICD-10-CM

## 2018-05-03 ENCOUNTER — Encounter (INDEPENDENT_AMBULATORY_CARE_PROVIDER_SITE_OTHER): Payer: Self-pay | Admitting: Family Medicine

## 2018-05-04 ENCOUNTER — Other Ambulatory Visit (INDEPENDENT_AMBULATORY_CARE_PROVIDER_SITE_OTHER): Payer: Self-pay

## 2018-05-04 ENCOUNTER — Ambulatory Visit (HOSPITAL_BASED_OUTPATIENT_CLINIC_OR_DEPARTMENT_OTHER): Payer: Self-pay | Admitting: Cardiovascular Disease

## 2018-05-04 ENCOUNTER — Ambulatory Visit (INDEPENDENT_AMBULATORY_CARE_PROVIDER_SITE_OTHER): Payer: BC Managed Care – PPO | Admitting: Family Medicine

## 2018-05-04 ENCOUNTER — Other Ambulatory Visit (INDEPENDENT_AMBULATORY_CARE_PROVIDER_SITE_OTHER): Payer: BC Managed Care – PPO

## 2018-05-04 ENCOUNTER — Encounter (INDEPENDENT_AMBULATORY_CARE_PROVIDER_SITE_OTHER): Payer: Self-pay | Admitting: Family Medicine

## 2018-05-04 VITALS — BP 122/66 | HR 76 | Temp 98.1°F | Resp 20 | Ht 75.0 in | Wt 225.0 lb

## 2018-05-04 DIAGNOSIS — I2699 Other pulmonary embolism without acute cor pulmonale: Secondary | ICD-10-CM

## 2018-05-04 DIAGNOSIS — R079 Chest pain, unspecified: Secondary | ICD-10-CM

## 2018-05-04 DIAGNOSIS — I10 Essential (primary) hypertension: Secondary | ICD-10-CM

## 2018-05-04 DIAGNOSIS — I2510 Atherosclerotic heart disease of native coronary artery without angina pectoris: Secondary | ICD-10-CM

## 2018-05-04 DIAGNOSIS — E782 Mixed hyperlipidemia: Secondary | ICD-10-CM

## 2018-05-04 DIAGNOSIS — K900 Celiac disease: Secondary | ICD-10-CM

## 2018-05-04 DIAGNOSIS — Z6828 Body mass index (BMI) 28.0-28.9, adult: Secondary | ICD-10-CM

## 2018-05-04 DIAGNOSIS — M1A9XX0 Chronic gout, unspecified, without tophus (tophi): Secondary | ICD-10-CM

## 2018-05-04 LAB — COMPREHENSIVE METABOLIC PNL, FASTING
ALBUMIN: 4.8 g/dL (ref 3.5–5.0)
ALKALINE PHOSPHATASE: 47 U/L (ref 38–126)
ALT (SGPT): 28 U/L (ref 21–72)
ANION GAP: 11 mmol/L
AST (SGOT): 33 U/L (ref 17–59)
BILIRUBIN TOTAL: 0.5 mg/dL (ref 0.2–5.0)
BUN/CREA RATIO: 17
BUN: 25 mg/dL — ABNORMAL HIGH (ref 9–20)
CALCIUM: 9.9 mg/dL (ref 8.4–10.2)
CHLORIDE: 105 mmol/L (ref 98–107)
CO2 TOTAL: 25 mmol/L (ref 22–30)
CREATININE: 1.48 mg/dL — ABNORMAL HIGH (ref 0.66–1.25)
ESTIMATED GFR: 48 mL/min/1.73mˆ2 — ABNORMAL LOW (ref >60)
GLUCOSE: 101 mg/dL (ref 74–106)
POTASSIUM: 4.8 mmol/L (ref 3.5–5.1)
PROTEIN TOTAL: 7.5 g/dL (ref 6.3–8.2)
PROTEIN TOTAL: 7.5 g/dL (ref 6.3–8.2)
SODIUM: 141 mmol/L (ref 137–145)

## 2018-05-04 LAB — CBC WITH DIFF
BASOPHIL #: 0.06 x10ˆ3/uL (ref 0.00–0.10)
BASOPHIL %: 2 % — ABNORMAL HIGH (ref 0–1)
EOSINOPHIL #: 0.23 x10ˆ3/uL (ref 0.00–0.40)
EOSINOPHIL %: 6 % (ref 0–6)
HCT: 41.5 % (ref 40.7–52.3)
HGB: 13.3 g/dL (ref 12.5–16.9)
HGB: 13.3 g/dL (ref 12.5–16.9)
LYMPHOCYTE #: 1.5 x10ˆ3/uL (ref 1.00–3.70)
LYMPHOCYTE %: 37 % (ref 14–45)
MCH: 27.9 pg (ref 27.0–33.4)
MCHC: 32 g/dL (ref 31.0–37.0)
MCV: 87 fL (ref 84.1–99.3)
MONOCYTE #: 0.34 x10ˆ3/uL (ref 0.00–0.70)
MONOCYTE %: 8 % (ref 0–14)
NEUTROPHIL #: 1.98 x10ˆ3/uL (ref 1.50–7.00)
NEUTROPHIL %: 48 % (ref 43–80)
PLATELETS: 219 x10?3/uL (ref 129–381)
RBC: 4.77 x10ˆ6/uL (ref 4.09–5.89)
RDW: 15.2 % (ref 10.8–15.2)
RDW: 15.2 % (ref 10.8–15.2)
WBC: 4.1 x10?3/uL (ref 2.6–9.8)

## 2018-05-04 LAB — MAGNESIUM: MAGNESIUM: 2.2 mg/dL (ref 1.9–2.3)

## 2018-05-04 LAB — LIPID PANEL
CHOLESTEROL: 164 mg/dL (ref 0–199)
HDL CHOL: 43 mg/dL (ref 40–60)
LDL CALC: 98 mg/dL (ref 0–130)
TRIGLYCERIDES: 115 mg/dL (ref 0–149)
VLDL CALC: 23 mg/dL

## 2018-05-04 LAB — CREATINE KINASE (CK), TOTAL, SERUM OR PLASMA: CREATINE KINASE: 225 U/L — ABNORMAL HIGH (ref 55–170)

## 2018-05-04 LAB — THYROID STIMULATING HORMONE WITH FREE T4 REFLEX: TSH: 2.2 u[IU]/mL (ref 0.465–4.680)

## 2018-05-04 NOTE — Telephone Encounter (Addendum)
Returned call to wife, had left message asking for call to discuss patient. Reviewed chart- patient did see Dr. Julien Girt today with complaints of consistent chest pain. Was unable to speak directly to wife, but left voice message. Instructed on use of nitroglycerin, to go to ER for continued chest pain after 3 nitro tablets. Will order stress test d/t continued chest pain & patient's history of CAD. Patient does have follow up with Dr. Cornelia Copa on 6/27. Wife to call back with any further questions or concerns.     Pt returned call and states he is able to walk on treadmill for stress test. Dionisio Paschal, RN  05/04/2018, 15:58

## 2018-05-04 NOTE — Progress Notes (Signed)
Subjective:     Patient ID:  Dustin Webster is an 62 y.o. male   Chief Complaint:    Chief Complaint   Patient presents with   . Epigastric Pain     Pt reports new chest pain.  Onset 2 weeks ago.  More belching than usual and indigestion.  Has cardiology appt next week.     Patient here for acute visit.  Patient has been seen in the last 3 years    States 2-3 weeks ago was working on some outside products.  Began having chest pain in states has been pretty consistent since.  No real heartburn with the but more bloating and gas.  Belching relieves the pain.  Known kidney disease and will be seen nephrologist in [place] again soon.  Known celiac disease as well.  Had been on Protonix for some time but this stopped and replaced with Zantac due to kidney disease.  States can tell not nearly as effective as Protonix but understands why he cannot take.  Continues on anticoagulation.  However also history of coronary artery disease and bypass surgery with complications after.  Also history of a PE but remains on anticoagulation.  States chest feel sore sometimes sore to the touch sometimes on the left side sometimes on the right.  Not worsening with eating and has had his gallbladder out in the past.  However states mowing the grass can make it worse at times as well and rest makes better.  At beginning of visit is very nonchalant about this but near end of visit admits this has made him an easy.  Has routine visit with Cardiology next week had not thought about calling them.  Had thought about going to the ER but then things really most likely GI in nature.  Pain and discomfort not as severe today as it was a few days ago.  Does continue to watch his gluten free diet very closely.  Blood pressure has been controlled.  No cough or hemoptysis.  Again history of PE but remains on anticoagulation.  Is fasting if labs are needed today.  Denies any vomiting melena or frank rectal bleeding.  HPI  Past Medical History:      Diagnosis Date   . Anemia    . Atrial fibrillation (CMS HCC)    . BPH associated with nocturia    . CKD (chronic kidney disease)    . DVT (deep venous thrombosis) (CMS HCC)    . Nocturia    . Pericardial effusion    . Pulmonary emboli (CMS [facility])      Past Surgical History:   Procedure Laterality Date   . CARD INFERIOR VENA CAVA FILTER REMOVAL N/A 01/17/2017    Performed by [name], MD at [facility] [place]   . HX CHOLECYSTECTOMY     . HX HEART SURGERY  12/2015   . HX HERNIA REPAIR     . SKIN CANCER EXCISION      melanoma/ear/[facility]     Current Outpatient Medications   Medication Sig   . allopurinol (ZYLOPRIM) 100 mg Oral Tablet Once a day   . amLODIPine (NORVASC) 10 mg Oral Tablet Take 1 Tab (10 mg total) by mouth Once a day   . ascorbic acid, vitamin C, (VITAMIN C) 500 mg Oral Tablet 0.5 Tabs Twice daily   . aspirin (ECOTRIN) 81 mg Oral Tablet, Delayed Release (E.C.) Once a day   . Calcium Carbonate (OS-CAL) 650 mg calcium (1,625 mg) Oral  Tablet Once a day   . citalopram (CELEXA) 10 mg Oral Tablet Take 10 mg by mouth Once a day   . fenofibrate nanocrystallized (TRICOR) 145 mg Oral Tablet Take 1 Tab (145 mg total) by mouth Every morning with breakfast   . ferrous sulfate (IRON) 325 mg (65 mg iron) Oral Tablet Twice daily   . JANTOVEN 4 mg Oral Tablet Take 1 Tab by mouth Once a day   . KRILL OIL ORAL Once a day   . LUTEIN ORAL Once a day   . Magnesium 250 mg Oral Tablet Once a day   . melatonin 1 mg Oral Tablet As directed   . multivitamin (CENTRUM SILVER) 400-250 mcg Oral Tablet, Chewable Take 1 Tab by mouth Once a day   . multivitamin Oral Tablet Once a day   . nitroGLYCERIN (NITROSTAT) 0.4 mg Sublingual Tablet, Sublingual 1 Tab (0.4 mg total) by Sublingual route Every 5 minutes as needed for Chest pain for 3 doses over 15 minutes   . pantoprazole (PROTONIX) 20 mg Oral Tablet, Delayed Release (E.C.) Take 40 mg by mouth Every morning before breakfast   . raNITIdine (ZANTAC) 150 mg Oral Tablet Take 1 Tab by  mouth Twice daily   . tamsulosin (FLOMAX) 0.4 mg Oral Capsule Take 1 Cap (0.4 mg total) by mouth Every evening after dinner   . UBIDECARENONE (COQ-10 ORAL) Once a day   . warfarin (COUMADIN) 5 mg Oral Tablet Once a day As directed     Allergies   Allergen Reactions   . Gluten      Celiac disease     Family Medical History:     Problem Relation (Age of Onset)    Emphysema Father    Heart Attack General Family Hx    Heart Disease Mother    Hypertension (High Blood Pressure) Mother    Stroke Mother          Review of Systems   Constitutional: Positive for diaphoresis and fatigue. Negative for fever and unexpected weight change.   Respiratory: Positive for chest tightness and shortness of breath. Negative for apnea and wheezing.    Cardiovascular: Positive for chest pain. Negative for palpitations and leg swelling.   Gastrointestinal: Positive for abdominal pain and nausea.   Genitourinary: Negative for dysuria.   Musculoskeletal: Negative for myalgias.   Neurological: Negative for syncope, light-headedness and headaches.   Psychiatric/Behavioral: Negative for dysphoric mood.     Objective:   Physical Exam   Constitutional: He is oriented to person, place, and time. No distress.   Neck: Normal range of motion. Neck supple. No JVD present. No thyromegaly present.   Cardiovascular: Normal rate and regular rhythm.   No murmur heard.  Pulmonary/Chest: Effort normal and breath sounds normal. He exhibits tenderness.   Abdominal: Soft. Bowel sounds are normal. There is tenderness.   More lower abd not midepigastric    Musculoskeletal: He exhibits no edema or tenderness.   Lymphadenopathy:     He has no cervical adenopathy.   Neurological: He is alert and oriented to person, place, and time. Coordination normal.   Psychiatric: He has a normal mood and affect.   Vitals reviewed.    BP 122/66 (Site: Left, Patient Position: Sitting, Cuff Size: Adult)   Pulse 76   Temp 36.7 C (98.1 F) (Tympanic)   Resp 20   Ht 1.905 m (6\' 3" )    Wt 102.1 kg (225 lb)   SpO2 98%   BMI 28.12 kg/m  Assessment & Plan:         ICD-10-CM    1. Hypertension, unspecified type I10    2. Mixed hyperlipidemia E78.2 LIPID PANEL     COMPREHENSIVE METABOLIC PNL, FASTING     CREATINE KINASE (CK), TOTAL, SERUM     THYROID STIMULATING HORMONE WITH FREE T4 REFLEX   3. Celiac disease K90.0    4. Chronic gout M1A.9XX0    5. BMI 28.0-28.9,adult Z68.28    6. Chest pain, unspecified type R07.9 CBC/DIFF     MAGNESIUM     ECG w Interp (Clinic Only)   7. CAD (coronary artery disease), native coronary artery I25.10    8. Pulmonary emboli (CMS HCC) I26.99      Chest pain uncertain etiology multiple concerns EKG reviewed home overall unremarkable without any acute ischemic changes.  May be GI related with change of Protonix to Zantac however with severity of previous coronary artery disease did discuss emergency room.  Patient unsure if he will go.  Check fasting labs.  Patient will call Cardiology as well if does not go to emergency room.  If worsens agrees to definitely go to ER.  If cardiac negative discussed even with renal disease may have to reconsider Protonix even at lower doses or every other day for GI protection.  Continue on anticoagulation with history of PE and continue gluten free diet for celiac.  Follow-up after either hospitalization or cardiology visit.

## 2018-05-07 ENCOUNTER — Ambulatory Visit (HOSPITAL_BASED_OUTPATIENT_CLINIC_OR_DEPARTMENT_OTHER): Payer: Self-pay

## 2018-05-07 ENCOUNTER — Other Ambulatory Visit: Payer: Self-pay

## 2018-05-07 ENCOUNTER — Observation Stay (HOSPITAL_COMMUNITY): Payer: BC Managed Care – PPO

## 2018-05-07 ENCOUNTER — Inpatient Hospital Stay
Admission: EM | Admit: 2018-05-07 | Discharge: 2018-05-11 | DRG: 287 | Disposition: A | Discharge status: Home / Self Care | Payer: BC Managed Care – PPO | Attending: Internal Medicine | Admitting: Internal Medicine

## 2018-05-07 ENCOUNTER — Encounter (HOSPITAL_COMMUNITY): Payer: Self-pay | Admitting: NURSE PRACTITIONER

## 2018-05-07 ENCOUNTER — Emergency Department (HOSPITAL_COMMUNITY): Payer: BC Managed Care – PPO | Admitting: Radiology

## 2018-05-07 ENCOUNTER — Inpatient Hospital Stay (EMERGENCY_DEPARTMENT_HOSPITAL): Payer: BC Managed Care – PPO | Admitting: Internal Medicine

## 2018-05-07 DIAGNOSIS — M1A9XX0 Chronic gout, unspecified, without tophus (tophi): Secondary | ICD-10-CM | POA: Diagnosis present

## 2018-05-07 DIAGNOSIS — R351 Nocturia: Secondary | ICD-10-CM | POA: Diagnosis present

## 2018-05-07 DIAGNOSIS — R079 Chest pain, unspecified: Secondary | ICD-10-CM

## 2018-05-07 DIAGNOSIS — Z79899 Other long term (current) drug therapy: Secondary | ICD-10-CM

## 2018-05-07 DIAGNOSIS — I081 Rheumatic disorders of both mitral and tricuspid valves: Secondary | ICD-10-CM | POA: Diagnosis present

## 2018-05-07 DIAGNOSIS — Z85820 Personal history of malignant melanoma of skin: Secondary | ICD-10-CM

## 2018-05-07 DIAGNOSIS — I959 Hypotension, unspecified: Secondary | ICD-10-CM | POA: Diagnosis present

## 2018-05-07 DIAGNOSIS — I200 Unstable angina: Secondary | ICD-10-CM | POA: Diagnosis present

## 2018-05-07 DIAGNOSIS — I129 Hypertensive chronic kidney disease with stage 1 through stage 4 chronic kidney disease, or unspecified chronic kidney disease: Secondary | ICD-10-CM | POA: Diagnosis present

## 2018-05-07 DIAGNOSIS — N183 Chronic kidney disease, stage 3 (moderate): Secondary | ICD-10-CM | POA: Diagnosis present

## 2018-05-07 DIAGNOSIS — Z7982 Long term (current) use of aspirin: Secondary | ICD-10-CM

## 2018-05-07 DIAGNOSIS — I25110 Atherosclerotic heart disease of native coronary artery with unstable angina pectoris: Secondary | ICD-10-CM

## 2018-05-07 DIAGNOSIS — R0789 Other chest pain: Secondary | ICD-10-CM

## 2018-05-07 DIAGNOSIS — I10 Essential (primary) hypertension: Secondary | ICD-10-CM | POA: Diagnosis present

## 2018-05-07 DIAGNOSIS — Z86711 Personal history of pulmonary embolism: Secondary | ICD-10-CM

## 2018-05-07 DIAGNOSIS — Z7901 Long term (current) use of anticoagulants: Secondary | ICD-10-CM

## 2018-05-07 DIAGNOSIS — I82409 Acute embolism and thrombosis of unspecified deep veins of unspecified lower extremity: Secondary | ICD-10-CM

## 2018-05-07 DIAGNOSIS — I2510 Atherosclerotic heart disease of native coronary artery without angina pectoris: Principal | ICD-10-CM

## 2018-05-07 DIAGNOSIS — I2699 Other pulmonary embolism without acute cor pulmonale: Secondary | ICD-10-CM | POA: Diagnosis present

## 2018-05-07 DIAGNOSIS — Z538 Procedure and treatment not carried out for other reasons: Secondary | ICD-10-CM | POA: Diagnosis not present

## 2018-05-07 DIAGNOSIS — D649 Anemia, unspecified: Secondary | ICD-10-CM | POA: Diagnosis present

## 2018-05-07 DIAGNOSIS — E782 Mixed hyperlipidemia: Secondary | ICD-10-CM | POA: Diagnosis present

## 2018-05-07 DIAGNOSIS — Z951 Presence of aortocoronary bypass graft: Secondary | ICD-10-CM

## 2018-05-07 DIAGNOSIS — D6489 Other specified anemias: Secondary | ICD-10-CM | POA: Diagnosis present

## 2018-05-07 DIAGNOSIS — N400 Enlarged prostate without lower urinary tract symptoms: Secondary | ICD-10-CM | POA: Diagnosis present

## 2018-05-07 DIAGNOSIS — K900 Celiac disease: Secondary | ICD-10-CM | POA: Diagnosis present

## 2018-05-07 DIAGNOSIS — Z86718 Personal history of other venous thrombosis and embolism: Secondary | ICD-10-CM

## 2018-05-07 DIAGNOSIS — K219 Gastro-esophageal reflux disease without esophagitis: Secondary | ICD-10-CM | POA: Diagnosis present

## 2018-05-07 DIAGNOSIS — I4891 Unspecified atrial fibrillation: Secondary | ICD-10-CM | POA: Diagnosis present

## 2018-05-07 DIAGNOSIS — Z9049 Acquired absence of other specified parts of digestive tract: Secondary | ICD-10-CM

## 2018-05-07 DIAGNOSIS — N401 Enlarged prostate with lower urinary tract symptoms: Secondary | ICD-10-CM | POA: Diagnosis present

## 2018-05-07 DIAGNOSIS — B348 Other viral infections of unspecified site: Secondary | ICD-10-CM | POA: Diagnosis present

## 2018-05-07 DIAGNOSIS — R0600 Dyspnea, unspecified: Secondary | ICD-10-CM

## 2018-05-07 DIAGNOSIS — E785 Hyperlipidemia, unspecified: Secondary | ICD-10-CM | POA: Diagnosis present

## 2018-05-07 HISTORY — DX: Chest pain, unspecified: R07.9

## 2018-05-07 HISTORY — DX: Atherosclerotic heart disease of native coronary artery without angina pectoris: I25.10

## 2018-05-07 LAB — CBC WITH DIFF
BASOPHIL #: 0.1 x10?3/uL (ref 0.00–1.00)
BASOPHIL #: 0.1 x10ˆ3/uL (ref 0.00–1.00)
BASOPHIL %: 1 % (ref 0–1)
EOSINOPHIL #: 0.3 x10ˆ3/uL (ref 0.00–0.40)
EOSINOPHIL %: 6 % — ABNORMAL HIGH (ref 1–5)
HCT: 41.7 % — ABNORMAL LOW (ref 42.0–52.0)
HGB: 13.5 g/dL — ABNORMAL LOW (ref 14.0–18.0)
LYMPHOCYTE #: 1.5 x10ˆ3/uL (ref 1.00–4.00)
LYMPHOCYTE %: 28 % (ref 25–40)
LYMPHOCYTE %: 28 % (ref 25–40)
MCH: 28.6 pg (ref 27.0–31.0)
MCHC: 32.3 g/dL — ABNORMAL LOW (ref 33.0–37.0)
MCHC: 32.3 g/dL — ABNORMAL LOW (ref 33.0–37.0)
MCV: 88.6 fL (ref 80.0–94.0)
MONOCYTE #: 0.5 x10ˆ3/uL (ref 0.10–0.80)
MONOCYTE %: 9 % (ref 4–10)
NEUTROPHIL #: 2.9 x10ˆ3/uL (ref 1.80–7.00)
NEUTROPHIL %: 55 % (ref 50–73)
PLATELETS: 210 x10ˆ3/uL (ref 130–400)
RBC: 4.71 x10ˆ6/uL (ref 4.70–6.10)
RDW: 15.6 % — ABNORMAL HIGH (ref 11.5–14.5)
WBC: 5.2 x10?3/uL (ref 4.8–10.8)
WBC: 5.2 x10ˆ3/uL (ref 4.8–10.8)

## 2018-05-07 LAB — PT/INR
INR: 2.22 (ref <=4.00)
PROTHROMBIN TIME: 25.7 s — ABNORMAL HIGH (ref 9.7–13.6)

## 2018-05-07 LAB — ECG 12 LEAD
Atrial Rate: 81 {beats}/min
Atrial Rate: 55 {beats}/min
Calculated P Axis: 24 degrees
Calculated R Axis: -11 degrees
Calculated R Axis: -14 degrees
Calculated T Axis: 55 degrees
Calculated T Axis: 53 degrees
PR Interval: 166 ms
PR Interval: 188 ms
PR Interval: 188 ms
QRS Duration: 86 ms
QRS Duration: 86 ms
QT Interval: 370 ms
QT Interval: 420 ms
QTC Calculation: 429 ms
QTC Calculation: 401 ms
Ventricular rate: 81 {beats}/min
Ventricular rate: 55 {beats}/min

## 2018-05-07 LAB — COMPREHENSIVE METABOLIC PANEL, NON-FASTING
ALBUMIN: 4.8 g/dL (ref 3.6–4.8)
ALKALINE PHOSPHATASE: 45 U/L (ref 38–126)
ALT (SGPT): 26 U/L (ref 3–45)
ANION GAP: 12 mmol/L
AST (SGOT): 29 U/L (ref 7–56)
BILIRUBIN TOTAL: 0.4 mg/dL (ref 0.2–1.3)
BUN/CREA RATIO: 13
BUN: 20 mg/dL (ref 9–21)
CALCIUM: 10 mg/dL (ref 8.5–10.3)
CHLORIDE: 109 mmol/L (ref 101–111)
CO2 TOTAL: 24 mmol/L (ref 22–31)
CREATININE: 1.5 mg/dL — ABNORMAL HIGH (ref 0.70–1.40)
CREATININE: 1.5 mg/dL — ABNORMAL HIGH (ref 0.70–1.40)
ESTIMATED GFR: 47 mL/min/1.73mˆ2 — ABNORMAL LOW (ref >=60)
GLUCOSE: 87 mg/dL (ref 68–99)
POTASSIUM: 4.7 mmol/L (ref 3.6–5.0)
PROTEIN TOTAL: 7.8 g/dL (ref 6.2–8.0)
SODIUM: 145 mmol/L (ref 137–145)

## 2018-05-07 LAB — TROPONIN-I
TROPONIN I: <0.01 ng/mL (ref 0.00–0.03)
TROPONIN I: <0.01 ng/mL (ref 0.00–0.03)
TROPONIN I: <0.01 ng/mL (ref 0.00–0.03)

## 2018-05-07 LAB — LDL CHOLESTEROL, DIRECT: LDL DIRECT: 91 mg/dL (ref 60–129)

## 2018-05-07 LAB — PTT (PARTIAL THROMBOPLASTIN TIME): APTT: 44.8 s — ABNORMAL HIGH (ref 26.0–39.0)

## 2018-05-07 LAB — D-DIMER: D-DIMER: <200 ng/mL

## 2018-05-07 MED ORDER — FERROUS SULFATE 325 MG (65 MG IRON) TABLET
325.00 mg | ORAL_TABLET | Freq: Two times a day (BID) | ORAL | Status: DC
Start: 2018-05-07 — End: 2018-05-11
  Administered 2018-05-07 – 2018-05-11 (×8): 325 mg via ORAL
  Filled 2018-05-07 (×8): qty 1

## 2018-05-07 MED ORDER — FAMOTIDINE 20 MG TABLET
20.00 mg | ORAL_TABLET | Freq: Two times a day (BID) | ORAL | Status: DC
Start: 2018-05-07 — End: 2018-05-07

## 2018-05-07 MED ORDER — WARFARIN 5 MG TABLET
10.00 mg | ORAL_TABLET | Freq: Once | ORAL | Status: AC
Start: 2018-05-07 — End: 2018-05-07
  Administered 2018-05-07: 10 mg via ORAL
  Filled 2018-05-07: qty 2

## 2018-05-07 MED ORDER — TAMSULOSIN 0.4 MG CAPSULE
0.40 mg | ORAL_CAPSULE | Freq: Every evening | ORAL | Status: DC
Start: 2018-05-07 — End: 2018-05-11
  Administered 2018-05-07 – 2018-05-10 (×4): 0.4 mg via ORAL
  Filled 2018-05-07 (×4): qty 1

## 2018-05-07 MED ORDER — ASPIRIN 81 MG TABLET,DELAYED RELEASE
81.0000 mg | DELAYED_RELEASE_TABLET | Freq: Every day | ORAL | Status: DC
Start: 2018-05-07 — End: 2018-05-11
  Administered 2018-05-07: 17:00:00 0 mg via ORAL
  Administered 2018-05-09 – 2018-05-11 (×3): 81 mg via ORAL
  Filled 2018-05-07 (×5): qty 1

## 2018-05-07 MED ORDER — CITALOPRAM 10 MG TABLET
10.0000 mg | ORAL_TABLET | Freq: Every day | ORAL | Status: DC
Start: 2018-05-07 — End: 2018-05-11
  Administered 2018-05-07: 0 mg via ORAL
  Administered 2018-05-08 – 2018-05-11 (×4): 10 mg via ORAL
  Filled 2018-05-07 (×5): qty 1

## 2018-05-07 MED ORDER — MAGNESIUM HYDROXIDE 400 MG/5 ML ORAL SUSPENSION
15.0000 mL | Freq: Every day | ORAL | Status: DC | PRN
Start: 2018-05-07 — End: 2018-05-11

## 2018-05-07 MED ORDER — ONDANSETRON HCL (PF) 4 MG/2 ML INJECTION SOLUTION
4.0000 mg | Freq: Four times a day (QID) | INTRAMUSCULAR | Status: DC | PRN
Start: 2018-05-07 — End: 2018-05-11

## 2018-05-07 MED ORDER — SODIUM CHLORIDE 0.9 % (FLUSH) INJECTION SYRINGE
3.0000 mL | INJECTION | INTRAMUSCULAR | Status: DC | PRN
Start: 2018-05-07 — End: 2018-05-11

## 2018-05-07 MED ORDER — WARFARIN 5 MG TABLET
5.00 mg | ORAL_TABLET | Freq: Every evening | ORAL | Status: DC
Start: 2018-05-07 — End: 2018-05-07

## 2018-05-07 MED ORDER — SODIUM CHLORIDE 0.9 % (FLUSH) INJECTION SYRINGE
3.0000 mL | INJECTION | INTRAMUSCULAR | Status: DC | PRN
Start: 2018-05-07 — End: 2018-05-07

## 2018-05-07 MED ORDER — SODIUM CHLORIDE 0.9 % (FLUSH) INJECTION SYRINGE
3.0000 mL | INJECTION | Freq: Three times a day (TID) | INTRAMUSCULAR | Status: DC
Start: 2018-05-07 — End: 2018-05-11
  Administered 2018-05-07 (×2): 0 mL
  Administered 2018-05-08: 3 mL
  Administered 2018-05-08: 0 mL
  Administered 2018-05-09: 3 mL
  Administered 2018-05-09 – 2018-05-11 (×6): 0 mL

## 2018-05-07 MED ORDER — MULTIVITAMIN-IRON 9 MG-FOLIC ACID 400 MCG-CALCIUM AND MINERALS TABLET
1.0000 | ORAL_TABLET | Freq: Every day | ORAL | Status: DC
Start: 2018-05-07 — End: 2018-05-11
  Administered 2018-05-07: 0 via ORAL
  Administered 2018-05-08 – 2018-05-11 (×4): 1 via ORAL
  Filled 2018-05-07 (×5): qty 1

## 2018-05-07 MED ORDER — NITROGLYCERIN 0.4 MG SUBLINGUAL TABLET
0.40 mg | SUBLINGUAL_TABLET | SUBLINGUAL | Status: DC | PRN
Start: 2018-05-07 — End: 2018-05-11

## 2018-05-07 MED ORDER — FAMOTIDINE 20 MG TABLET
20.00 mg | ORAL_TABLET | Freq: Two times a day (BID) | ORAL | Status: DC
Start: 2018-05-07 — End: 2018-05-11
  Administered 2018-05-07 – 2018-05-10 (×5): 20 mg via ORAL
  Filled 2018-05-07 (×8): qty 1

## 2018-05-07 MED ORDER — SODIUM CHLORIDE 0.9 % (FLUSH) INJECTION SYRINGE
3.0000 mL | INJECTION | Freq: Three times a day (TID) | INTRAMUSCULAR | Status: DC
Start: 2018-05-07 — End: 2018-05-07
  Administered 2018-05-07: 0 mL

## 2018-05-07 MED ORDER — IPRATROPIUM 0.5 MG-ALBUTEROL 3 MG (2.5 MG BASE)/3 ML NEBULIZATION SOLN
3.0000 mL | INHALATION_SOLUTION | RESPIRATORY_TRACT | Status: DC | PRN
Start: 2018-05-07 — End: 2018-05-11

## 2018-05-07 MED ORDER — FENOFIBRATE MICRONIZED 134 MG CAPSULE
134.0000 mg | ORAL_CAPSULE | Freq: Every day | ORAL | Status: DC
Start: 2018-05-07 — End: 2018-05-11
  Administered 2018-05-07: 0 mg via ORAL
  Administered 2018-05-08 – 2018-05-11 (×4): 134 mg via ORAL
  Filled 2018-05-07 (×5): qty 1

## 2018-05-07 MED ORDER — WARFARIN - PHARMACIST TO DOSE PER PROTOCOL
Freq: Every day | Status: DC | PRN
Start: 2018-05-07 — End: 2018-05-08

## 2018-05-07 MED ORDER — CALCIUM 600 MG (AS CALCIUM CARBONATE 1,500 MG) TABLET
600.0000 mg | ORAL_TABLET | Freq: Every day | ORAL | Status: DC
Start: 2018-05-07 — End: 2018-05-11
  Administered 2018-05-07 – 2018-05-11 (×5): 600 mg via ORAL
  Filled 2018-05-07 (×5): qty 1

## 2018-05-07 MED ORDER — ACETAMINOPHEN 500 MG TABLET
500.0000 mg | ORAL_TABLET | Freq: Three times a day (TID) | ORAL | Status: DC
Start: 2018-05-07 — End: 2018-05-11
  Administered 2018-05-07: 500 mg via ORAL
  Administered 2018-05-07 – 2018-05-08 (×3): 0 mg via ORAL
  Administered 2018-05-08 – 2018-05-10 (×7): 500 mg via ORAL
  Administered 2018-05-11 (×2): 0 mg via ORAL
  Filled 2018-05-07 (×12): qty 1

## 2018-05-07 MED ORDER — AMLODIPINE 10 MG TABLET
10.0000 mg | ORAL_TABLET | Freq: Every day | ORAL | Status: DC
Start: 2018-05-07 — End: 2018-05-08
  Administered 2018-05-07: 0 mg via ORAL
  Administered 2018-05-08: 10 mg via ORAL
  Filled 2018-05-07 (×2): qty 1

## 2018-05-07 MED ORDER — ASPIRIN 81 MG CHEWABLE TABLET
324.0000 mg | CHEWABLE_TABLET | ORAL | Status: AC
Start: 2018-05-07 — End: 2018-05-07
  Administered 2018-05-07: 243 mg via ORAL
  Filled 2018-05-07: qty 4

## 2018-05-07 NOTE — ED Triage Notes (Signed)
Hx of cabg. Pt reports recent mid sternal CP x "few weeks". Called physician and was told to take nitro x 3 for relief and pt pain did not subside completely. Pt advised to follow up in ED. Recent medication change for GERD. Pt reports nonradiating pain at this time but does report recent exacerbation of GERD s/s. Aox3 in triage. Denies SOB.

## 2018-05-07 NOTE — Care Plan (Signed)
Problem: Adult Inpatient Plan of Care  Goal: Plan of Care Review  Outcome: Ongoing (see interventions/notes)  Goal: Patient-Specific Goal (Individualization)  Outcome: Ongoing (see interventions/notes)  Goal: Absence of Hospital-Acquired Illness or Injury  Outcome: Ongoing (see interventions/notes)  Goal: Optimal Comfort and Wellbeing  Outcome: Ongoing (see interventions/notes)  Goal: Rounds/Family Conference  Outcome: Ongoing (see interventions/notes)     Problem: Adjustment to Illness (Acute Coronary Syndrome)  Goal: Optimal Adaptation to Illness  Outcome: Ongoing (see interventions/notes)     Problem: Arrhythmia (Acute Coronary Syndrome)  Goal: Normalized Cardiac Rhythm  Outcome: Ongoing (see interventions/notes)     Problem: Pain (Acute Coronary Syndrome)  Goal: Absence of Cardiac-Related Pain  Outcome: Ongoing (see interventions/notes)     Problem: Tissue Perfusion (Acute Coronary Syndrome)  Goal: Adequate Tissue Perfusion  Outcome: Ongoing (see interventions/notes)     Problem: Pain Acute  Goal: Optimal Pain Control  Outcome: Ongoing (see interventions/notes)

## 2018-05-07 NOTE — Nurses Notes (Signed)
Sarah, NP notified that Coumadin was the only medication that was not correct in the home med list. Correct dose and frequency now listed under the home med list. She states she will take a look at it.

## 2018-05-07 NOTE — Nurses Notes (Signed)
Pt is sitting up in bed alert and oriented x4, respirations easy on room air. Describes mid chest pain 2/10 that's not really gone away since coming in to the ED. States pain is tolerable and denies any other symptoms at this time. For further details please see flow sheets.

## 2018-05-07 NOTE — H&P (Signed)
Dustin Webster  H&P    [name] 62 y.o. male A11/A11   Date of Service: 05/07/2018    Date of Admission:  05/07/2018   PCP: [name], MD Code Status:No Order       Chief Complaint:  Chest pain  HPI: Dustin Webster is a 62 y.o., White male who presents with PMH significant for CAD/CABG, HTN, HLD, CKD III, PE/DVT on warfarin, chronic anemia.  He presented to ER with complaints of chest pain.  Patient states he has had persistent chest pain for about 2-3 weeks.  Initially thought it was GI related in nature.  His protonix was changed to Zantac 3 months ago by his nephrologist.  He saw his PCP 05/04/2018 with complaints of chest and epigastric pain and increased belching.  Patient states last 2 days he has taken 3 nitroglycerin each day with no relief of his chest pain.  Pain is worse with activity and some better with rest however is constant in nature.  States pain increases and decreases in intensity.  For the most part pain level is 3/10  and described as a pressure feeling in the substernal chest area.  Pain does radiate to the left and right side of his chest.  Denies radiation to arm, neck, back or jaw.  Denies associated shortness of breath or diaphoresis however patient's wife states he seems more short of breath with activity.  He reports dry cough but no fever or chills.  Denies nausea, vomiting, melena.  Reports chronic diarrhea in relation to his celiac disease.  Patient does have history of PE/DVT and takes warfarin. First occurrence 10 years ago with recurrence 2.5 years ago after bypass surgery.  He has had IVC filter in the past that was removed 01/2017.  In ER patient had EKG that was normal sinus rhythm.  First troponin was negative.  He was admitted to hospitalist service for further workup and management.      ROS:   A 10 point organ ROS obtained and negative except for pertinent positives noted in the HPI.      PMHx:    Past Medical History:   Diagnosis Date   . Anemia       . Atrial fibrillation (CMS HCC)    . BPH associated with nocturia    . CAD (coronary artery disease)    . CKD (chronic kidney disease)    . DVT (deep venous thrombosis) (CMS HCC)    . Nocturia    . Pericardial effusion    . Pulmonary emboli (CMS [facility])         PSHx:   Past Surgical History:   Procedure Laterality Date   . HX CHOLECYSTECTOMY     . HX CORONARY ARTERY BYPASS GRAFT     . HX HEART SURGERY  12/2015   . HX HERNIA REPAIR     . SKIN CANCER EXCISION      melanoma/ear/[facility]          Allergies:    Allergies   Allergen Reactions   . Gluten      Celiac disease    Social History  Social History     Tobacco Use   . Smoking status: Never Smoker   . Smokeless tobacco: Never Used   Substance Use Topics   . Alcohol use: Not Currently     Comment: rare   . Drug use: Not on file       Family History  Family Medical  History:     Problem Relation (Age of Onset)    Emphysema Father    Heart Attack General Family Hx    Heart Disease Mother    Hypertension (High Blood Pressure) Mother    Stroke Mother               Home Meds:      Prior to Admission medications    Medication Sig Start Date End Date Taking? Authorizing Provider   allopurinol (ZYLOPRIM) 100 mg Oral Tablet Once a day    Provider, Historical   amLODIPine (NORVASC) 10 mg Oral Tablet Take 1 Tab (10 mg total) by mouth Once a day 02/22/17   Landry Corporal, MD   ascorbic acid, vitamin C, (VITAMIN C) 500 mg Oral Tablet 0.5 Tabs Twice daily    Provider, Historical   aspirin (ECOTRIN) 81 mg Oral Tablet, Delayed Release (E.C.) Once a day    Provider, Historical   Calcium Carbonate (OS-CAL) 650 mg calcium (1,625 mg) Oral Tablet Once a day    Provider, Historical   citalopram (CELEXA) 10 mg Oral Tablet Take 10 mg by mouth Once a day    Provider, Historical   fenofibrate nanocrystallized (TRICOR) 145 mg Oral Tablet Take 1 Tab (145 mg total) by mouth Every morning with breakfast 08/11/17   Landry Corporal, MD   ferrous sulfate (IRON) 325 mg (65 mg iron) Oral Tablet Twice daily     Provider, Historical   JANTOVEN 4 mg Oral Tablet Take 1 Tab by mouth Once a day 03/30/18   Provider, Historical   KRILL OIL ORAL Once a day    Provider, Historical   LUTEIN ORAL Once a day    Provider, Historical   Magnesium 250 mg Oral Tablet Once a day    Provider, Historical   melatonin 1 mg Oral Tablet As directed    Provider, Historical   multivitamin (CENTRUM SILVER) 400-250 mcg Oral Tablet, Chewable Take 1 Tab by mouth Once a day    Provider, Historical   multivitamin Oral Tablet Once a day    Provider, Historical   nitroGLYCERIN (NITROSTAT) 0.4 mg Sublingual Tablet, Sublingual 1 Tab (0.4 mg total) by Sublingual route Every 5 minutes as needed for Chest pain for 3 doses over 15 minutes 02/13/18 02/13/19  [name], MD   pantoprazole (PROTONIX) 20 mg Oral Tablet, Delayed Release (E.C.) Take 40 mg by mouth Every morning before breakfast    Provider, Historical   raNITIdine (ZANTAC) 150 mg Oral Tablet Take 1 Tab by mouth Twice daily 03/12/18   Provider, Historical   tamsulosin (FLOMAX) 0.4 mg Oral Capsule Take 1 Cap (0.4 mg total) by mouth Every evening after dinner 01/09/18   Landry Corporal, MD   UBIDECARENONE (COQ-10 ORAL) Once a day    Provider, Historical   warfarin (COUMADIN) 5 mg Oral Tablet Once a day As directed    Provider, Historical            CBC Results Differential Results   Recent Labs     05/07/18  1303   WBC 5.2   HGB 13.5*   HCT 41.7*   PLTCNT 210      Recent Labs     05/07/18  1303   LYMPHOCYTES 28   MONOCYTES 9   EOSINOPHIL 6*   PMNABS 2.90   LYMPHSABS 1.50   MONOSABS 0.50   EOSABS 0.30          BMP Results Other Chemistries Results  Recent Labs     05/07/18  1303   SODIUM 145   POTASSIUM 4.7   CHLORIDE 109   CO2 24   BUN 20   CREATININE 1.50*   GFR 47*   GLUCOSENF 87        Recent Labs     05/07/18  1303   CALCIUM 10.0   ALBUMIN 4.8        Liver/Pancreas Enzyme Results ABG results   Recent Labs     05/07/18  1303   TOTBILIRUBIN 0.4   AST 29   ALT 26   ALKPHOS 45   TOTALPROTEIN 7.8   ALBUMIN 4.8         No results found for this encounter   Cardiac Results    Coag Results   Recent Labs     05/07/18  1228   UHCEASTTROPI <0.01      Recent Labs     05/07/18  1303   PROTHROMTME 25.7*   INR 2.22   APTT 44.8*          Lipid Panel Toxicology   No results for input(s): CHOLESTEROL, TRIG, HDLCHOL, LDLCHOL in the last 72 hours. No results for input(s): ETHANOL, UAMPTN, UBARBN, [place], [place], [place], [place], [place], [place], PROPOXYPHEUR in the last 72 hours.         UA Cultures   No results for input(s): CHARACTER, COLOR, SPECGRAVUR, PHURINE, GLUCOSE, KETONES, UROBILINOGEN, LEUKOCYTES, NITRITE, EPITHCEL, BACTERIA, MUCOUS, AMORPCRY, AMORPHSED, CASTS, BLOOD in the last 72 hours.    Invalid input(s): PROTIEN     No results found for any visits on 05/07/18 (from the past 96 hour(s)).       Other Micro pathology       No results found for this visit on 05/07/18.                      Imaging:  Xr Ap Mobile Chest    Result Date: 05/07/2018  Male, 62 years old. XR AP MOBILE CHEST performed on 05/07/2018 1:26 PM. REASON FOR EXAM:  CHEST PAIN TECHNIQUE: 1 views/1 images submitted for interpretation. COMPARISON:  09/19/2016 FINDINGS:  The patient is status post sternotomy. The heart is top normal as to size. The pulmonary vascularity is within normal limits. The lungs are expanded and no focal consolidative process is seen in the lungs. No pleural effusion. No pneumothorax.     1. No acute cardiopulmonary process is seen. Radiologist location ID: [id number]         Inpatient Medications:    Current Facility-Administered Medications:  NS flush syringe 3 mL Intracatheter Q8HRS   NS flush syringe 3 mL Intracatheter Q1H PRN      ______________________________________________________________________    PHYSICAL EXAM:  VITALS: BP 120/78   Pulse 65   Temp 36.8 C (98.2 F)   Resp 15   Ht 1.905 m (6\' 3" )   Wt 101.6 kg (224 lb)   SpO2 96%   BMI 28.00 kg/m         GENERAL:   Pt is a pleasant, well-nourished, well-developed 62 y.o. male  who is resting comfortably in bed in NAD. Appears stated age  HENT:  head normocephalic, symmetrical facies. No rhinorrhea. Oropharyngeal mucous membranes are moist w/o erythema/exudates   Eyes: EOM intact. PERRLA. Sclera non-icteric, non-injected.  NECK: supple. No masses  CV: RRR. No murmurs, rubs, gallops. No BLE edema,  LUNGS: CTAB, No rhonchi, rales, wheezes.  GI: (+) BS in all 4 quadrants. soft,  NT/ND. No rigidity/ guarding/ rebound.   MSK: full AROM. No joint effusions/ swelling/ deformities. Gait not assessed at this time.  SKIN: No significant lesions, rashes, ecchymoses noted. Chest pain reproducible over the substernal area  NEURO: CN grossly intact. Sensation intact b/l. No focal deficits.  PSYCH: A&Ox4. Mood, behavior and affect normal w/ Intact judgement & insight       Assessments:  1. Chest pain, atypical  2. Hx CAD/CABG  3. 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 CKD stage III at baseline  5. GERD  6. Chronic anemia, stable    Plan:   Patient will be admitted to hospitalists service for c/o CP. Will trend troponin/EKG.  Pain worse with exertion. Not relieved with  Nitroglycerin.  Seems atypical in nature.  Pain is reproducible over the sternal area.  Will check D-dimer and V/Q scan to rule out acute PE.  INR therapeutic.  Consult Cardiology for further evaluation and recommendations.   Cont ASA, tricor. Unable to tolerate statin in past d/t myalgias. Not on beta blocker 2/2 fatigue per cardiology.   Cont coumadin. INR 2.22. Monitor daily INR.   Creatinine 1.5 which is at baseline.  Monitor daily BMP    Other chronic medical problems stable. Cont home medications    DVT prophylaxis: Coumadin    All critical aspects of this history and physical and assessment/plan was discussed in detail with Dr. Shawn Stall, and she does agree with the stated above.   Meta Hatchet, APRN,NP-C    This is a 63 year old white male who comes in with chief complaint of chest pain that has been persistent and constant for 2-3 weeks.   It changes in intensity.  He states that recently his Protonix was changed to Zantac and since then he has had some increased reflux.  Because the pain was consistent he tried 3 sublingual nitroglycerin yesterday.  There was no change in the pain.  The pain is worse with ambulation.  It is substernal and radiates to both sides of his chest.  His chest is tender with palpation.  There is no nausea vomiting or shortness of breath associated with the chest pain.  He has had a dry cough for about a week and a sore throat that started yesterday.  He has no fever chills.  His chronic diarrhea secondary is celiac disease.  He states his last stress test was 2 and half years ago.  He sees Dr. Cornelia Copa.  His medications include aspirin Norvasc Celexa iron try cord Zantac Flomax and Coumadin.  He has a past medical history of coronary artery disease with CABG and he has had 2 PEs and 1 DVT.  He had an IVC filter and this has since been removed.  His chronic kidney disease and anemia.  Labs were reviewed.  We did a stat D-dimer and it was less than 200.  He has no calf swelling.  INR is 2.22.  BUN and creatinine 20 and 1.5 and this appears to be stable.  LFTs within normal limits troponins negative x2 CK is 225 EKG x2 by my view shows no acute ST changes.  Lungs are clear to auscultation bilaterally heart is regular rate and rhythm without any murmurs rubs or gallops.  Chest is tender in the lower sternum and the left anterior chest wall centrally in the right anterior chest wall more so centrally and anteriorly.  Abdomen is soft nontender nondistended.  Extremities no edema.  No calf tenderness or swelling.  Assessment plan 1 coronary artery  disease with history of CABG to acute chest pain that is atypical 3 chronic kidney disease that is stable for history of DVT and PE.  This does not appear to be cardiac in origin since it seems quite atypical and has been going on for 2-3 weeks constant with 2-troponins and EKG negative x2.   Patient is concerned however about his heart and that is why he came here.  Apparently his primary care physician was going to get a stress test as an outpatient.  We did a stat D-dimer this was less than 200.  Will do ultrasounds of his lower extremities and if these are negative I doubt the patient has a PE.  Patient states this does not sound like his PE.  We will go ahead and order a stress test in the morning and ask cardiologist to see the patient.  We will draw 1 more set of troponins and EKGs.  Will place him on telemetry.The patient was seen in conjunction with the nurse Markus Daft, NP.  We discussed history, physical exam, assessment and plan. I reviewed her orders and agree with them as written.    Petra Kuba, MD  This note may have been partially generated using M[employer] Fluency Direct system, and there may be some incorrect words, spellings, and punctuation that were not noted in checking the note before saving.

## 2018-05-07 NOTE — Telephone Encounter (Signed)
Wife states pt's chest pressure/indigestion feeling persists even after 3 nitro tablets taken yesterday. Stress testing was ordered last week as stat order- awaiting prior authorization approval. Encouraged wife and patient to go to nearest ER for further evaluation as nitro was not effective in relief and unknown when stress test may be scheduled. Wife verbalizes understanding and is encouraged to call office with any further questions or concerns. Dionisio Paschal, RN  05/07/2018, 10:32

## 2018-05-07 NOTE — ED Resident Handoff Note (Signed)
Emergency Department  Provider Note  HPI - 05/07/2018    Name: Willeen Cass  Age and Gender: 62 y.o. male  Attending: Dr. Marvel Plan    PCP: [name], MD    HPI:  Dustin Webster is a 62 y.o. male  with a past medical history significant for CAD status post CABG 2 years ago, history of PEs on Coumadin, hyperlipidemia, hypertension who presents emergency department for chest pain of 2 weeks.  Patient states that he called his cardiologist's office suggested trying 3 nitros relieve the pain.  Patient states that did relieve pain ill when they called back at the cardiologist's office by September emergency department.  Patient states that it started while he was doing strenuous outside but bothers him even at rest now.  He describes the pain as burning chest pain that goes up to 10/10 on exertion with no radiation.  He states he has no additional symptoms but wife, however, states that he seems more short of breath.  He sees Dr. Cornelia Copa  as cardiologist and had his CABG done in [place]    History provided by:     Review of Systems:    Constitutional: No fever, chills  Skin: No rashes or lesions  HENT: No sore throat, ear pain, or difficulty swallowing  Eyes: No vision changes, redness, discharge  Cardio: No chest pain, palpitations   Respiratory: No cough, wheezing or SOB  GI:  No nausea or vomiting. No diarrhea or constipation. No abdominal pain  GU:  No dysuria, hematuria, polyuria  MSK: No joint pain.  No neck or back pain  Neuro: No numbness, tingling, or weakness.  No headache  All other systems reviewed and are negative, unless commented on in the HPI.       Below information reviewed with patient:   Current Outpatient Medications   Medication Sig   . allopurinol (ZYLOPRIM) 100 mg Oral Tablet Once a day   . amLODIPine (NORVASC) 10 mg Oral Tablet Take 1 Tab (10 mg total) by mouth Once a day   . ascorbic acid, vitamin C, (VITAMIN C) 500 mg Oral Tablet 0.5 Tabs Twice daily   . aspirin (ECOTRIN) 81 mg Oral  Tablet, Delayed Release (E.C.) Once a day   . Calcium Carbonate (OS-CAL) 650 mg calcium (1,625 mg) Oral Tablet Once a day   . citalopram (CELEXA) 10 mg Oral Tablet Take 10 mg by mouth Once a day   . fenofibrate nanocrystallized (TRICOR) 145 mg Oral Tablet Take 1 Tab (145 mg total) by mouth Every morning with breakfast   . ferrous sulfate (IRON) 325 mg (65 mg iron) Oral Tablet Twice daily   . JANTOVEN 4 mg Oral Tablet Take 1 Tab by mouth Once a day   . KRILL OIL ORAL Once a day   . LUTEIN ORAL Once a day   . Magnesium 250 mg Oral Tablet Once a day   . melatonin 1 mg Oral Tablet As directed   . multivitamin (CENTRUM SILVER) 400-250 mcg Oral Tablet, Chewable Take 1 Tab by mouth Once a day   . multivitamin Oral Tablet Once a day   . nitroGLYCERIN (NITROSTAT) 0.4 mg Sublingual Tablet, Sublingual 1 Tab (0.4 mg total) by Sublingual route Every 5 minutes as needed for Chest pain for 3 doses over 15 minutes   . pantoprazole (PROTONIX) 20 mg Oral Tablet, Delayed Release (E.C.) Take 40 mg by mouth Every morning before breakfast   . raNITIdine (ZANTAC) 150 mg Oral Tablet Take  1 Tab by mouth Twice daily   . tamsulosin (FLOMAX) 0.4 mg Oral Capsule Take 1 Cap (0.4 mg total) by mouth Every evening after dinner   . UBIDECARENONE (COQ-10 ORAL) Once a day   . warfarin (COUMADIN) 5 mg Oral Tablet Once a day As directed       Allergies   Allergen Reactions   . Gluten      Celiac disease          Past Medical History:  Past Medical History:   Diagnosis Date   . Anemia    . Atrial fibrillation (CMS HCC)    . BPH associated with nocturia    . CKD (chronic kidney disease)    . DVT (deep venous thrombosis) (CMS HCC)    . Nocturia    . Pericardial effusion    . Pulmonary emboli (CMS [facility])        Past Surgical History:  Past Surgical History:   Procedure Laterality Date   . Hx cholecystectomy     . Hx heart surgery  12/2015   . Hx hernia repair     . Skin cancer excision         Social History:  Social History     Tobacco Use   . Smoking  status: Never Smoker   . Smokeless tobacco: Never Used   Substance Use Topics   . Alcohol use: Not Currently     Comment: rare   . Drug use: Not on file     Social History     Substance and Sexual Activity   Drug Use Not on file       Family History:  Family History   Problem Relation Age of Onset   . Heart Disease Mother    . Hypertension (High Blood Pressure) Mother    . Stroke Mother    . Emphysema Father    . Heart Attack General Family Hx          Old records reviewed.      Objective:  Nursing notes reviewed    Filed Vitals:    05/07/18 1245 05/07/18 1300 05/07/18 1301 05/07/18 1330   BP: 125/83 118/73  120/78   Pulse: 75 72 71 65   Resp: 14 15 15 15    Temp:       SpO2: 100% 96% 99% 96%       Physical Exam  Nursing note and vitals reviewed.  Vital signs reviewed as above.     Constitutional: Pt is well-developed and well-nourished.   Head: Normocephalic and atraumatic.   Eyes: Conjunctivae are normal. Pupils are equal, round, and reactive to light. EOM are intact  Neck: Soft, supple, full range of motion.  Cardiovascular: RRR.  No Murmurs/rubs/gallops. Distal pulses present and equal bilaterally.  Pulmonary/Chest: Normal BS BL with no distress. No audible wheezes or crackles are noted.  Abdominal: Soft, nontender, nondistended.  No rebound, guarding, or masses.  Musculoskeletal: Normal range of motion. No deformities.  Exhibits no edema and no tenderness.   Neurological: CNs 2-12 grossly intact.  No focal deficits noted.  Skin: Warm and dry. No rash or lesions  Psychiatric: Patient has a normal mood and affect.     Work-up:  Orders Placed This Encounter   . XR AP MOBILE CHEST   . TROPONIN-I (SERIAL TROPONINS)   . CBC/DIFF   . COMPREHENSIVE METABOLIC PANEL, NON-FASTING   . LDL CHOLESTEROL, DIRECT   . PT/INR   . PTT (PARTIAL THROMBOPLASTIN TIME)   .  CBC WITH DIFF   . OXYGEN PROTOCOL   . ECG 12 LEAD- TIME WITH TROPONINS   . INSERT & MAINTAIN PERIPHERAL IV ACCESS   . NS flush syringe   . NS flush syringe   .  aspirin chewable tablet 324 mg        Labs:  Results for orders placed or performed during the hospital encounter of 05/07/18 (from the past 24 hour(s))   TROPONIN-I (SERIAL TROPONINS)   Result Value Ref Range    TROPONIN I <0.01 0.00 - 0.03 ng/mL   CBC/DIFF    Narrative    The following orders were created for panel order CBC/DIFF.  Procedure                               Abnormality         Status                     ---------                               -----------         ------                     CBC WITH JKKX[[id number]]                Abnormal            Final result                 Please view results for these tests on the individual orders.   COMPREHENSIVE METABOLIC PANEL, NON-FASTING   Result Value Ref Range    SODIUM 145 137 - 145 mmol/L    POTASSIUM 4.7 3.6 - 5.0 mmol/L    CHLORIDE 109 101 - 111 mmol/L    CO2 TOTAL 24 22 - 31 mmol/L    ANION GAP 12 mmol/L    BUN 20 9 - 21 mg/dL    CREATININE 1.50 (H) 0.70 - 1.40 mg/dL    BUN/CREA RATIO 13     ESTIMATED GFR 47 (L) >=60 mL/min/1.48m^2    ALBUMIN 4.8 3.6 - 4.8 g/dL    CALCIUM 10.0 8.5 - 10.3 mg/dL    GLUCOSE 87 68 - 99 mg/dL    ALKALINE PHOSPHATASE 45 38 - 126 U/L    ALT (SGPT) 26 3 - 45 U/L    AST (SGOT) 29 7 - 56 U/L    BILIRUBIN TOTAL 0.4 0.2 - 1.3 mg/dL    PROTEIN TOTAL 7.8 6.2 - 8.0 g/dL   LDL CHOLESTEROL, DIRECT   Result Value Ref Range    LDL DIRECT 91 60 - 129 mg/dL   PT/INR   Result Value Ref Range    PROTHROMBIN TIME 25.7 (H) 9.7 - 13.6 seconds    INR 2.22 <=4.00   PTT (PARTIAL THROMBOPLASTIN TIME)   Result Value Ref Range    APTT 44.8 (H) 26.0 - 39.0 seconds   CBC WITH DIFF   Result Value Ref Range    WBC 5.2 4.8 - 10.8 x10^3/uL    RBC 4.71 4.70 - 6.10 x10^6/uL    HGB 13.5 (L) 14.0 - 18.0 g/dL    HCT 41.7 (L) 42.0 - 52.0 %    MCV 88.6 80.0 - 94.0 fL    MCH 28.6 27.0 - 31.0 pg  MCHC 32.3 (L) 33.0 - 37.0 g/dL    RDW 15.6 (H) 11.5 - 14.5 %    PLATELETS 210 130 - 400 x10^3/uL    NEUTROPHIL % 55 50 - 73 %    LYMPHOCYTE % 28 25 - 40 %    MONOCYTE  % 9 4 - 10 %    EOSINOPHIL % 6 (H) 1 - 5 %    BASOPHIL % 1 0 - 1 %    NEUTROPHIL # 2.90 1.80 - 7.00 x10^3/uL    LYMPHOCYTE # 1.50 1.00 - 4.00 x10^3/uL    MONOCYTE # 0.50 0.10 - 0.80 x10^3/uL    EOSINOPHIL # 0.30 0.00 - 0.40 x10^3/uL    BASOPHIL # 0.10 0.00 - 1.00 x10^3/uL       Imaging:     Results for orders placed or performed during the hospital encounter of 05/07/18 (from the past 72 hour(s))   XR AP MOBILE CHEST     Status: None    Narrative    Male, 62 years old.    XR AP MOBILE CHEST performed on 05/07/2018 1:26 PM.    REASON FOR EXAM:  CHEST PAIN    TECHNIQUE: 1 views/1 images submitted for interpretation.    COMPARISON:  09/19/2016    FINDINGS:  The patient is status post sternotomy. The heart is top normal  as to size. The pulmonary vascularity is within normal limits.    The lungs are expanded and no focal consolidative process is seen in the  lungs. No pleural effusion. No pneumothorax.      Impression    1. No acute cardiopulmonary process is seen.        Radiologist location ID: [id number]         Abnormal Lab results:  Labs Reviewed   COMPREHENSIVE METABOLIC PANEL, NON-FASTING - Abnormal; Notable for the following components:       Result Value    CREATININE 1.50 (*)     ESTIMATED GFR 47 (*)     All other components within normal limits   PT/INR - Abnormal; Notable for the following components:    PROTHROMBIN TIME 25.7 (*)     All other components within normal limits   PTT (PARTIAL THROMBOPLASTIN TIME) - Abnormal; Notable for the following components:    APTT 44.8 (*)     All other components within normal limits   CBC WITH DIFF - Abnormal; Notable for the following components:    HGB 13.5 (*)     HCT 41.7 (*)     MCHC 32.3 (*)     RDW 15.6 (*)     EOSINOPHIL % 6 (*)     All other components within normal limits   TROPONIN-I - Normal   LDL CHOLESTEROL, DIRECT - Normal   CBC/DIFF    Narrative:     The following orders were created for panel order CBC/DIFF.  Procedure                                Abnormality         Status                     ---------                               -----------         ------  CBC WITH FAOZ[[id number]]                Abnormal            Final result                 Please view results for these tests on the individual orders.   TROPONIN-I   TROPONIN-I         Date/Time ECG Read: 05/07/28  Was sinus rhythm with evidence of acute ischemia    Plan: Appropriate labs and imaging ordered. Medical Records reviewed.    MDM:   During the patient's stay in the emergency department, the above listed imaging and/or labs were performed to assist with medical decision making and were reviewed by myself when available for review.          Pt remained stable throughout the emergency department course.      Consults:     Dr. Shawn Stall was consulted agreed to accept the patient under her service.    Impression: Chest pain  Disposition: Admitted     Patient will be admitted to the hospitalist service for further evaluation and management.     14:04: Care of patient will be transferred to Dr. Shawn Stall at this time.  They were made aware of history/physical, relevant labs/imaging and pending studies. The following is still pending at time of Transfer of Care:   serial EKGs and troponin    Christella Noa, MD  05/07/2018, 14:04    Has seen the patient in conjunction with the resident and agree with above-listed findings.  At this time patient is hemodynamically stable does have a long cardiac history will be admitted for further testing and evaluation.    Karie Kirks, MD  05/11/2018, 08:58

## 2018-05-08 ENCOUNTER — Observation Stay (HOSPITAL_COMMUNITY): Payer: BC Managed Care – PPO

## 2018-05-08 ENCOUNTER — Encounter (HOSPITAL_COMMUNITY)
Admission: EM | Disposition: A | Discharge status: Home / Self Care | Payer: Self-pay | Source: Home / Self Care | Attending: Internal Medicine

## 2018-05-08 ENCOUNTER — Inpatient Hospital Stay (HOSPITAL_COMMUNITY): Payer: BC Managed Care – PPO

## 2018-05-08 ENCOUNTER — Observation Stay (HOSPITAL_BASED_OUTPATIENT_CLINIC_OR_DEPARTMENT_OTHER): Payer: BC Managed Care – PPO

## 2018-05-08 DIAGNOSIS — I2510 Atherosclerotic heart disease of native coronary artery without angina pectoris: Secondary | ICD-10-CM

## 2018-05-08 DIAGNOSIS — R0600 Dyspnea, unspecified: Secondary | ICD-10-CM

## 2018-05-08 DIAGNOSIS — I361 Nonrheumatic tricuspid (valve) insufficiency: Secondary | ICD-10-CM

## 2018-05-08 DIAGNOSIS — I340 Nonrheumatic mitral (valve) insufficiency: Secondary | ICD-10-CM

## 2018-05-08 DIAGNOSIS — R6889 Other general symptoms and signs: Secondary | ICD-10-CM

## 2018-05-08 DIAGNOSIS — R079 Chest pain, unspecified: Secondary | ICD-10-CM

## 2018-05-08 DIAGNOSIS — I10 Essential (primary) hypertension: Secondary | ICD-10-CM

## 2018-05-08 DIAGNOSIS — Z951 Presence of aortocoronary bypass graft: Secondary | ICD-10-CM

## 2018-05-08 LAB — CBC WITH DIFF
BASOPHIL #: 0.1 x10ˆ3/uL (ref 0.00–1.00)
BASOPHIL %: 1 % (ref 0–1)
EOSINOPHIL #: 0.3 x10ˆ3/uL (ref 0.00–0.40)
EOSINOPHIL %: 6 % — ABNORMAL HIGH (ref 1–5)
HCT: 39.2 % — ABNORMAL LOW (ref 42.0–52.0)
HGB: 12.8 g/dL — ABNORMAL LOW (ref 14.0–18.0)
LYMPHOCYTE #: 1.9 x10ˆ3/uL (ref 1.00–4.00)
LYMPHOCYTE %: 38 % (ref 25–40)
MCH: 28.9 pg (ref 27.0–31.0)
MCHC: 32.7 g/dL — ABNORMAL LOW (ref 33.0–37.0)
MCV: 88.3 fL (ref 80.0–94.0)
MONOCYTE #: 0.6 x10ˆ3/uL (ref 0.10–0.80)
MONOCYTE %: 12 % — ABNORMAL HIGH (ref 4–10)
NEUTROPHIL #: 2.1 x10ˆ3/uL (ref 1.80–7.00)
NEUTROPHIL %: 42 % — ABNORMAL LOW (ref 50–73)
PLATELETS: 186 x10ˆ3/uL (ref 130–400)
RBC: 4.43 x10ˆ6/uL — ABNORMAL LOW (ref 4.70–6.10)
RDW: 15.7 % — ABNORMAL HIGH (ref 11.5–14.5)
WBC: 5 x10ˆ3/uL (ref 4.8–10.8)

## 2018-05-08 LAB — COMPREHENSIVE METABOLIC PANEL, NON-FASTING
ALBUMIN: 4.3 g/dL (ref 3.6–4.8)
ALKALINE PHOSPHATASE: 39 U/L (ref 38–126)
ALT (SGPT): 26 U/L (ref 3–45)
ANION GAP: 6 mmol/L
AST (SGOT): 27 U/L (ref 7–56)
BILIRUBIN TOTAL: 0.5 mg/dL (ref 0.2–1.3)
BUN/CREA RATIO: 11
BUN: 18 mg/dL (ref 9–21)
CALCIUM: 9.9 mg/dL (ref 8.5–10.3)
CHLORIDE: 108 mmol/L (ref 101–111)
CO2 TOTAL: 28 mmol/L (ref 22–31)
CREATININE: 1.6 mg/dL — ABNORMAL HIGH (ref 0.70–1.40)
ESTIMATED GFR: 44 mL/min/1.73mˆ2 — ABNORMAL LOW (ref >=60)
GLUCOSE: 95 mg/dL (ref 68–99)
POTASSIUM: 4.4 mmol/L (ref 3.6–5.0)
PROTEIN TOTAL: 7 g/dL (ref 6.2–8.0)
SODIUM: 142 mmol/L (ref 137–145)

## 2018-05-08 LAB — MAGNESIUM: MAGNESIUM: 2 mg/dL (ref 1.7–2.2)

## 2018-05-08 LAB — PT/INR
INR: 2.56 (ref <=4.00)
PROTHROMBIN TIME: 29.7 s — ABNORMAL HIGH (ref 9.7–13.6)

## 2018-05-08 SURGERY — CORS & LHC W/BYPASS GRAFTS W/WO LVG

## 2018-05-08 MED ORDER — LIDOCAINE HCL 20 MG/ML (2 %) INJECTION SOLUTION
INTRAMUSCULAR | Status: AC
Start: 2018-05-08 — End: 2018-05-08
  Filled 2018-05-08: qty 20

## 2018-05-08 MED ORDER — HEPARIN (PORCINE) (PF) 2,000 UNIT/1,000 ML IN 0.9 % SODIUM CHLORIDE IV
INTRAVENOUS | Status: AC
Start: 2018-05-08 — End: 2018-05-08
  Filled 2018-05-08: qty 2000

## 2018-05-08 MED ORDER — PHENYLEPHRINE 10 MG/ML INJECTION SOLUTION
INTRAMUSCULAR | Status: AC
Start: 2018-05-08 — End: 2018-05-08
  Filled 2018-05-08: qty 1

## 2018-05-08 MED ORDER — SODIUM CHLORIDE 0.9 % INJECTION SOLUTION
2.0000 mL | INTRAVENOUS | Status: AC
Start: 2018-05-08 — End: 2018-05-08
  Administered 2018-05-08: 2 mL via INTRAVENOUS

## 2018-05-08 MED ORDER — WARFARIN 5 MG TABLET
8.00 mg | ORAL_TABLET | Freq: Once | ORAL | Status: DC
Start: 2018-05-08 — End: 2018-05-08

## 2018-05-08 MED ORDER — SODIUM CHLORIDE 0.9 % INTRAVENOUS SOLUTION
INTRAVENOUS | Status: DC
Start: 2018-05-08 — End: 2018-05-09

## 2018-05-08 MED ADMIN — bacitracin 500 unit/gram topical ointment: ORAL | @ 11:00:00 | NDC 00168001135

## 2018-05-08 MED ADMIN — THROMBIN 5,000 UNITS IN NS: ORAL | @ 11:00:00 | NDC 43825060641

## 2018-05-08 MED ADMIN — sodium chloride 0.9 % (flush) injection syringe: ORAL | @ 11:00:00

## 2018-05-08 MED ADMIN — heparin (porcine) 1,000 unit/mL injection solution: @ 06:00:00

## 2018-05-08 NOTE — Consults (Signed)
Cardiology Consult Note  [facility]    Name: Dustin Webster                       Date of Birth: 1956-09-27   MRN:  [id number]                         Date of visit: 05/07/2018     PCP: [name], MD     Reason for Consult: Chest Pain      HOPI:  Dustin Webster is a 62 y.o. year old male who presents with complaints of chest pain.  The patient states he has noticed off and on chest pain for the last 2-3 weeks.  He feels that this is a little bit worse whenever he is exerting himself.  The pain is in the center of his chest with no significant radiation.  The patient states that he feels a pressure in his chest constantly but again appears a little worse when he exerts himself.  The patient denies any increased shortness of breath.  The patient does have a history of coronary artery disease status post coronary artery bypass grafting.  He had a LIMA to the LAD, saphenous vein graft to the OM/ramus/PDA.  Following the bypass the patient had a large hematoma and was urgently transferred to [facility] where he had this extracted.  The patient's anticoagulation was on hold and during this time he developed another pulmonary embolism.  The patient has been stable for some time.  The patient underwent a exercise treadmill stress test today which is blood pressure decreased significantly.  The patient was lightheaded and dizzy with this.  He did not pass out completely.  He did developed 6/10 chest pain at peak exercise.        Past Medical History:   Diagnosis Date   . Anemia    . Atrial fibrillation (CMS HCC)    . BPH associated with nocturia    . CAD (coronary artery disease)    . CKD (chronic kidney disease)    . DVT (deep venous thrombosis) (CMS HCC)    . Nocturia    . Pericardial effusion    . Pulmonary emboli (CMS [facility])            Past Surgical History:   Procedure Laterality Date   . HX CHOLECYSTECTOMY     . HX CORONARY ARTERY BYPASS GRAFT     . HX HEART SURGERY   12/2015   . HX HERNIA REPAIR     . SKIN CANCER EXCISION      melanoma/ear/[facility]           No current outpatient medications on file.       Allergies   Allergen Reactions   . Gluten      Celiac disease       Family Medical History:     Problem Relation (Age of Onset)    Emphysema Father    Heart Attack General Family Hx    Heart Disease Mother    Hypertension (High Blood Pressure) Mother    Stroke Mother              Social History     Tobacco Use   . Smoking status: Never Smoker   . Smokeless tobacco: Never Used   Substance Use Topics   . Alcohol use: Not Currently     Comment: rare  REVIEW OF SYSTEMS:   Constitutional: No fevers, chills, malaise or abnormal weight loss.    HENT: No tinnitus, vertigo, hearing loss, ear pain, sinus drainage, nasal congestion or throat pain.  Eyes: No vision changes, diplopia, drainage, pain  Cardiac:  + chest pain, no shortness of breath, no palpitations, no orthopnea, or PND  Respiratory: No cough, shortness of breath, wheezing  Gastrointestinal: No abdominal pain, nausea, vomiting, diarrhea, constipation, melana or hematochezia   Endocrine: No polyuria, polydipsia, heat or cold intolerance.  Genitourinary: No dysuria, urgency, frequency, hematuria, nocturia.  Musculoskeletal: No myalgias, arthralgias.  Skin: No rashes, suspicious lesions.  Neurological: No headaches, weakness, paresthesias  Hematological:  No bleeding, spontaneous bruising.  Psychiatric/Behavioral: No confusion, agitation, hallucinations, paranoia, delusions.    All other systems reviewed and are negative.    General Examination:     Vitals reviewed. BP 124/80   Pulse 80   Temp 36.7 C (98 F)   Resp 18   Ht 1.905 m (6' 3" )   Wt 99.5 kg (219 lb 6.4 oz)   SpO2 98%   BMI 27.42 kg/m       Constitutional: He is oriented to person, place, and time.  He appears well-developed and well-nourished.   HEENT:  Normocephalic, atraumatic.  TM's clear and intact bilaterally.  Canals clear.  Nose clear with drainage.   Normal appearing nasal mucosa.  Posterior pharynx clear without lesions or exudate. Mucous membranes moist without lesions.   Neck: Supple. Thyroid normal without enlargement or palpable nodules.  Trachea midline.  Cardiovascular: Normal rate and rhythm without murmurs, rubs or gallops.  No JVD.  No bruits.  No peripheral edema is noted.  Pulmonary/Chest: Lungs clear without wheezes, rales or rhonchi.  No chest wall tenderness or deformity.   Abdominal: Soft. Nontender, nondistended with normal bowel sounds in all quadrants. No hepatosplenomegaly. No guarding, rebound , abnormal masses.  Rectal exam deferred.  Musculoskeletal: Full and painless ROM in all joint groups.  Neurological: Alert and oriented x 4.  Cranial nerves 2-12 intact without defecits.  BUE's/BLE's.  Strength and sensation intact in all extremeties.   Skin: Skin is warm and dry. No rash or suspicious skin lesions noted.  Psychiatric:  Normal mood and affect. Speech is normal and behavior is normal. Judgment and thought content normal. Cognition and memory are normal.     Labs:     Results for orders placed or performed during the hospital encounter of 05/07/18 (from the past 24 hour(s))   TROPONIN-I (SERIAL TROPONINS)   Result Value Ref Range    TROPONIN I <0.01 0.00 - 0.03 ng/mL   TROPONIN-I (SERIAL TROPONINS)   Result Value Ref Range    TROPONIN I <0.01 0.00 - 0.03 ng/mL   TROPONIN-I (SERIAL TROPONINS)   Result Value Ref Range    TROPONIN I <0.01 0.00 - 0.03 ng/mL   CBC/DIFF    Narrative    The following orders were created for panel order CBC/DIFF.  Procedure                               Abnormality         Status                     ---------                               -----------         ------  CBC WITH ESPQ[[id number]]                Abnormal            Final result                 Please view results for these tests on the individual orders.   COMPREHENSIVE METABOLIC PANEL, NON-FASTING   Result Value Ref Range    SODIUM  145 137 - 145 mmol/L    POTASSIUM 4.7 3.6 - 5.0 mmol/L    CHLORIDE 109 101 - 111 mmol/L    CO2 TOTAL 24 22 - 31 mmol/L    ANION GAP 12 mmol/L    BUN 20 9 - 21 mg/dL    CREATININE 1.50 (H) 0.70 - 1.40 mg/dL    BUN/CREA RATIO 13     ESTIMATED GFR 47 (L) >=60 mL/min/1.61m2    ALBUMIN 4.8 3.6 - 4.8 g/dL    CALCIUM 10.0 8.5 - 10.3 mg/dL    GLUCOSE 87 68 - 99 mg/dL    ALKALINE PHOSPHATASE 45 38 - 126 U/L    ALT (SGPT) 26 3 - 45 U/L    AST (SGOT) 29 7 - 56 U/L    BILIRUBIN TOTAL 0.4 0.2 - 1.3 mg/dL    PROTEIN TOTAL 7.8 6.2 - 8.0 g/dL   LDL CHOLESTEROL, DIRECT   Result Value Ref Range    LDL DIRECT 91 60 - 129 mg/dL   PT/INR   Result Value Ref Range    PROTHROMBIN TIME 25.7 (H) 9.7 - 13.6 seconds    INR 2.22 <=4.00   PTT (PARTIAL THROMBOPLASTIN TIME)   Result Value Ref Range    APTT 44.8 (H) 26.0 - 39.0 seconds   CBC WITH DIFF   Result Value Ref Range    WBC 5.2 4.8 - 10.8 x10^3/uL    RBC 4.71 4.70 - 6.10 x10^6/uL    HGB 13.5 (L) 14.0 - 18.0 g/dL    HCT 41.7 (L) 42.0 - 52.0 %    MCV 88.6 80.0 - 94.0 fL    MCH 28.6 27.0 - 31.0 pg    MCHC 32.3 (L) 33.0 - 37.0 g/dL    RDW 15.6 (H) 11.5 - 14.5 %    PLATELETS 210 130 - 400 x10^3/uL    NEUTROPHIL % 55 50 - 73 %    LYMPHOCYTE % 28 25 - 40 %    MONOCYTE % 9 4 - 10 %    EOSINOPHIL % 6 (H) 1 - 5 %    BASOPHIL % 1 0 - 1 %    NEUTROPHIL # 2.90 1.80 - 7.00 x10^3/uL    LYMPHOCYTE # 1.50 1.00 - 4.00 x10^3/uL    MONOCYTE # 0.50 0.10 - 0.80 x10^3/uL    EOSINOPHIL # 0.30 0.00 - 0.40 x10^3/uL    BASOPHIL # 0.10 0.00 - 1.00 x10^3/uL   D-DIMER   Result Value Ref Range    D-DIMER <200 ng/mL DDU   CBC/DIFF    Narrative    The following orders were created for panel order CBC/DIFF.  Procedure                               Abnormality         Status                     ---------                               -----------         ------  CBC WITH DIFF[[id number]]                Abnormal            Final result                 Please view results for these tests on the individual  orders.   COMPREHENSIVE METABOLIC PANEL, NON-FASTING   Result Value Ref Range    SODIUM 142 137 - 145 mmol/L    POTASSIUM 4.4 3.6 - 5.0 mmol/L    CHLORIDE 108 101 - 111 mmol/L    CO2 TOTAL 28 22 - 31 mmol/L    ANION GAP 6 mmol/L    BUN 18 9 - 21 mg/dL    CREATININE 1.60 (H) 0.70 - 1.40 mg/dL    BUN/CREA RATIO 11     ESTIMATED GFR 44 (L) >=60 mL/min/1.53m2    ALBUMIN 4.3 3.6 - 4.8 g/dL    CALCIUM 9.9 8.5 - 10.3 mg/dL    GLUCOSE 95 68 - 99 mg/dL    ALKALINE PHOSPHATASE 39 38 - 126 U/L    ALT (SGPT) 26 3 - 45 U/L    AST (SGOT) 27 7 - 56 U/L    BILIRUBIN TOTAL 0.5 0.2 - 1.3 mg/dL    PROTEIN TOTAL 7.0 6.2 - 8.0 g/dL   MAGNESIUM   Result Value Ref Range    MAGNESIUM 2.0 1.7 - 2.2 mg/dL   PT/INR   Result Value Ref Range    PROTHROMBIN TIME 29.7 (H) 9.7 - 13.6 seconds    INR 2.56 <=4.00   CBC WITH DIFF   Result Value Ref Range    WBC 5.0 4.8 - 10.8 x10^3/uL    RBC 4.43 (L) 4.70 - 6.10 x10^6/uL    HGB 12.8 (L) 14.0 - 18.0 g/dL    HCT 39.2 (L) 42.0 - 52.0 %    MCV 88.3 80.0 - 94.0 fL    MCH 28.9 27.0 - 31.0 pg    MCHC 32.7 (L) 33.0 - 37.0 g/dL    RDW 15.7 (H) 11.5 - 14.5 %    PLATELETS 186 130 - 400 x10^3/uL    NEUTROPHIL % 42 (L) 50 - 73 %    LYMPHOCYTE % 38 25 - 40 %    MONOCYTE % 12 (H) 4 - 10 %    EOSINOPHIL % 6 (H) 1 - 5 %    BASOPHIL % 1 0 - 1 %    NEUTROPHIL # 2.10 1.80 - 7.00 x10^3/uL    LYMPHOCYTE # 1.90 1.00 - 4.00 x10^3/uL    MONOCYTE # 0.60 0.10 - 0.80 x10^3/uL    EOSINOPHIL # 0.30 0.00 - 0.40 x10^3/uL    BASOPHIL # 0.10 0.00 - 1.00 x10^3/uL           Assessment:  1. Atypical chest pain  2. Coronary artery disease status post coronary artery bypass grafting  3. Abnormal blood pressure response to exercise    Plan:  I was going to take the patient urgently for left heart catheterization with graft study although the patient is on Coumadin.  At this time the patient has received a fluid bolus and his blood pressure improved from 60 systolic up to 1510Csystolic.  He is feeling improvement with no chest pain  currently.  At this point time we would like to wait until his INR is decreased.  I will hold his Coumadin and we will start heparin when his INR is less than 2. We  will do the catheterization when the patient's INR is less than 1.5.  Patient have 2D echocardiogram performed today we will go ahead get his nuclear images to get more information.  I will continue to follow along with you.      Thank you very much for involving me in the care of this patient.  Please feel free to call me if I can be of any further assistance.

## 2018-05-08 NOTE — Addendum Note (Signed)
Addended by: Dionisio Paschal E on: 05/08/2018 09:28 AM     Modules accepted: Orders

## 2018-05-08 NOTE — Progress Notes (Signed)
[facility]  HOSPITALIST PROGRESS NOTE      Dustin Webster                     62 y.o. male 319/A    Date of service: 05/08/2018      Date of Admission:  05/07/2018   Code Status: Full Code      Assessments:  1. Chest pain, atypical  2. Hypotensive episode  3. Upper respiratory symptoms  4. Hx CAD s/p CABG  5. 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 CKD stage III at baseline  7. GERD  8. Chronic anemia, stable      Plan:   Patient underwent cardiac stress test today then had significant hypotension with SBP into the 60s, came up with a 350 cc normal saline bolus while down in nuclear med.  Cardiology wanted to take patient for emergent heart catheterization, however due to patient having elevated INR 2/2 his Coumadin use, it was recommended to hold his warfarin and begin heparin drip once INR is below 2. Will continue monitoring PT/INR daily.  Continue aspirin, Lofibra, nitroglycerin p.r.n.    Hold patient's amlodipine 2/2 hypotensive episode.  Will begin normal saline fluids 75 cc/hour, vital signs q.4 hours.   Will check respiratory viral panel and throat culture.   All other comorbidities stable at this time.  Trend daily CBC, BMP, magnesium in INR per above.    Subjective / Interval history:  This is 62 y.o.  White male who presents with PMH significant for CAD/CABG, HTN, HLD, CKD III, PE/DVT on warfarin s/p IVC filter removal 01/2017, chronic anemia who presents for chest pain.  Of note, patient's Protonix was recently changed to Zantac by nephrologist and patient was seen by his PCP on 06/21 with complaints of chest and epigastric pain as well as increased belching.  Patient's symptoms continued therefore he came to hospital and was admitted for further workup and management.  Patient underwent cardio stress test on 05/08/2018 and Cardiology is following.    Patient seen and evaluated independently of Dr. Shawn Stall at bedside.  Patient explains the after his stress test, he began feeling extremely  dizzy and blood pressure was checked which was very low and he was given fluids.  He states currently his chest pain is located substernally, rated 1/10 and described as a pressure.  He also endorses a sore throat and dry cough for 2 and half weeks.  He denies rhinorrhea.  He further denies fever, chills, N/V/D, associated shortness of breath or abdominal pain.  He also states dizziness has subsided.       PHYSICAL EXAM:  VITALS: BP 111/68   Pulse 80   Temp 36.7 C (98 F)   Resp 17   Ht 1.905 m (6\' 3" )   Wt 99.5 kg (219 lb 6.4 oz)   SpO2 98%   BMI 27.42 kg/m     GENERAL:   Pt is a pleasant male who appears stated age and is sitting up in bed in no acute distress.   Constitutional: appears in good health.  Eyes: Conjunctiva clear.  ENT: ENMT without erythema or injection, mucous membranes moist.  Neck: no thyromegaly, trachea midline.   Respiratory: Clear to auscultation bilaterally.   Cardiovascular: regular rate and rhythm.  Gastrointestinal: Soft, non-tender.  Genitourinary: Deferred  Musculoskeletal: AROM WNL of bilateral UE and LE.  Integumentary:  Skin warm and dry.  Psych: A&Ox3      DVT prophylaxis:  Holding  2/2 upcoming heart catheterization, once INR is below 2, heparinize  Disposition planning:  Home      CBC Results Differential Results   Recent Labs     05/07/18  1303 05/08/18  0313   WBC 5.2 5.0   HGB 13.5* 12.8*   HCT 41.7* 39.2*   PLTCNT 210 186      Recent Labs     05/07/18  1303 05/08/18  0313   LYMPHOCYTES 28 38   MONOCYTES 9 12*   EOSINOPHIL 6* 6*   PMNABS 2.90 2.10   LYMPHSABS 1.50 1.90   MONOSABS 0.50 0.60   EOSABS 0.30 0.30          BMP Results Other Chemistries Results   Recent Labs     05/07/18  1303 05/08/18  0314   SODIUM 145 142   POTASSIUM 4.7 4.4   CHLORIDE 109 108   CO2 24 28   BUN 20 18   CREATININE 1.50* 1.60*   GFR 47* 44*   GLUCOSENF 87 95        Recent Labs     05/07/18  1303 05/08/18  0314   CALCIUM 10.0 9.9   ALBUMIN 4.8 4.3   MAGNESIUM  --  2.0        Liver/Pancreas  Enzyme Results ABG results   Recent Labs     05/08/18  0314   TOTBILIRUBIN 0.5   AST 27   ALT 26   ALKPHOS 39   TOTALPROTEIN 7.0   ALBUMIN 4.3        No results found for this encounter   Cardiac Results    Coag Results   Recent Labs     05/07/18  1228 05/07/18  1511 05/07/18  1914   UHCEASTTROPI <0.01 <0.01 <0.01      Recent Labs     05/07/18  1303 05/08/18  0313   PROTHROMTME 25.7* 29.7*   INR 2.22 2.56   APTT 44.8*  --           Lipid Panel Toxicology   No results for input(s): CHOLESTEROL, TRIG, HDLCHOL, LDLCHOL in the last 72 hours. No results for input(s): ETHANOL, UAMPTN, UBARBN, [place], [place], [place], [place], [place], [place], PROPOXYPHEUR in the last 72 hours.         UA Cultures   No results for input(s): CHARACTER, COLOR, SPECGRAVUR, PHURINE, GLUCOSE, KETONES, UROBILINOGEN, LEUKOCYTES, NITRITE, EPITHCEL, BACTERIA, MUCOUS, AMORPCRY, AMORPHSED, CASTS, BLOOD in the last 72 hours.    Invalid input(s): PROTIEN     No results found for any visits on 05/07/18 (from the past 96 hour(s)).       Other Micro pathology       No results found for this visit on 05/07/18.           Pending Labs and Procedures     Order Current Status    TRANSTHORACIC ECHOCARDIOGRAM - ADULT In process                 Imaging:  Results for orders placed or performed during the hospital encounter of 05/07/18 (from the past 72 hour(s))   XR AP MOBILE CHEST     Status: None    Narrative    Male, 62 years old.    XR AP MOBILE CHEST performed on 05/07/2018 1:26 PM.    REASON FOR EXAM:  CHEST PAIN    TECHNIQUE: 1 views/1 images submitted for interpretation.    COMPARISON:  09/19/2016    FINDINGS:  The patient is status post sternotomy. The heart is top normal  as to size. The pulmonary vascularity is within normal limits.    The lungs are expanded and no focal consolidative process is seen in the  lungs. No pleural effusion. No pneumothorax.      Impression    1. No acute cardiopulmonary process is seen.        Radiologist location ID: [id number]                Inpatient Medications:    Current Facility-Administered Medications:  acetaminophen (TYLENOL) tablet 500 mg Oral 3x/day   aspirin (ECOTRIN) enteric coated tablet 81 mg 81 mg Oral Daily   calcium carbonate 1500mg  (600mg  of elemental calcium) tablet 600 mg Oral Daily   citalopram (CELEXA) tablet 10 mg Oral Daily   famotidine (PEPCID) tablet 20 mg Oral 2x/day   fenofibrate micronized (LOFIBRA) capsule 134 mg Oral Daily   ferrous sulfate 325 mg (elemental IRON 65 mg) tablet 325 mg Oral 2x/day   ipratropium-albuterol 0.5 mg-3 mg(2.5 mg base)/3 mL Solution for Nebulization 3 mL Nebulization Q4H PRN   magnesium hydroxide (MILK OF MAGNESIA) 400mg  per 69mL oral liquid 15 mL Oral Daily PRN   multivitamin-iron-folic acid-calcium-minerals (THERA-M PLUS) tablet 1 Tab Oral Daily   nitroGLYCERIN (NITROSTAT) sublingual tablet 0.4 mg Sublingual Q5 Min PRN   NS flush syringe 3 mL Intracatheter Q8HRS   NS flush syringe 3 mL Intracatheter Q1H PRN   NS premix infusion  Intravenous Continuous   ondansetron (ZOFRAN) 2 mg/mL injection 4 mg Intravenous Q6H PRN   tamsulosin (FLOMAX) capsule 0.4 mg Oral Daily after Dinner      ______________________________________________________________________      Brock Bad, PA-C    This patient was seen and evaluated independently of the attending physician. All aspects of the care plan were discussed in detail with her/him and she/he is in agreement with the care plan as stated above.    This note may have been partially generated using MModal Fluency Direct system, and there may be some incorrect words, spellings, and punctuation that were not noted in checking the note before saving.    Events after the stress test noted.  After the stress test the patient's blood pressure dropped into the 60 see any got very lightheaded and dizzy.  He did not pass out.  He did receive a fluid bolus and his blood pressure improved.  Dr. Cornelia Copa is going to do a left heart catheterization once his INR is less  than 1.5.  We are holding his Coumadin.  We are holding his Norvasc.  Will start him on some IV fluids.  The patient did complain of a sore throat that he states he has had for a couple of days.  Will do culture and a viral panel.  When I saw him he did not have any chest pain or shortness of breath.  His chest was still tender with palpation anteriorly.  His wife states that he had been doing some heavy lifting with some pavers in his yd.  Lungs were clear to auscultation bilaterally.  Heart was regular rate and rhythm.  Will wait the left heart catheterization.  Patient states this feels like the same event before his CABG in the past.I have seen the patient in conjunction with the physician assistant, Brock Bad, [place].  We have discussed the history, physical exam, assessment and plan. I reviewed the orders and agree with them as written.    Petra Kuba, MD  This note may have been partially generated using MModal Fluency Direct system, and there may be some incorrect words, spellings, and punctuation that were not noted in checking the note before saving.

## 2018-05-08 NOTE — Care Plan (Signed)
Problem: Adult Inpatient Plan of Care  Goal: Plan of Care Review  Outcome: Ongoing (see interventions/notes)  Goal: Patient-Specific Goal (Individualization)  Outcome: Ongoing (see interventions/notes)  Goal: Absence of Hospital-Acquired Illness or Injury  Outcome: Ongoing (see interventions/notes)  Goal: Optimal Comfort and Wellbeing  Outcome: Ongoing (see interventions/notes)  Goal: Rounds/Family Conference  Outcome: Ongoing (see interventions/notes)     Problem: Adjustment to Illness (Acute Coronary Syndrome)  Goal: Optimal Adaptation to Illness  Outcome: Ongoing (see interventions/notes)     Problem: Arrhythmia (Acute Coronary Syndrome)  Goal: Normalized Cardiac Rhythm  Outcome: Ongoing (see interventions/notes)     Problem: Pain (Acute Coronary Syndrome)  Goal: Absence of Cardiac-Related Pain  Outcome: Ongoing (see interventions/notes)     Problem: Tissue Perfusion (Acute Coronary Syndrome)  Goal: Adequate Tissue Perfusion  Outcome: Ongoing (see interventions/notes)     Problem: Pain Acute  Goal: Optimal Pain Control  Outcome: Ongoing (see interventions/notes)    Patient remains alert and oriented.  Telemetry monitored.  Medication administered per order.  Patients vitals signs remain stable.  Skin dry.  Patient has been free of falls.  Patients mood has been appropriate.  Education done per patient care plan.

## 2018-05-08 NOTE — Care Plan (Signed)
At this time pt is lying in bed resting quietly with eyes closed, respirations easy on room air. No s/sx of pain or distress noted, will continue to monitor. For further details please see flow sheets.       Problem: Adult Inpatient Plan of Care  Goal: Plan of Care Review  Outcome: Ongoing (see interventions/notes)  Goal: Patient-Specific Goal (Individualization)  Outcome: Ongoing (see interventions/notes)  Goal: Absence of Hospital-Acquired Illness or Injury  Outcome: Ongoing (see interventions/notes)  Goal: Optimal Comfort and Wellbeing  Outcome: Ongoing (see interventions/notes)  Goal: Rounds/Family Conference  Outcome: Ongoing (see interventions/notes)     Problem: Adjustment to Illness (Acute Coronary Syndrome)  Goal: Optimal Adaptation to Illness  Outcome: Ongoing (see interventions/notes)     Problem: Arrhythmia (Acute Coronary Syndrome)  Goal: Normalized Cardiac Rhythm  Outcome: Ongoing (see interventions/notes)     Problem: Pain (Acute Coronary Syndrome)  Goal: Absence of Cardiac-Related Pain  Outcome: Ongoing (see interventions/notes)     Problem: Tissue Perfusion (Acute Coronary Syndrome)  Goal: Adequate Tissue Perfusion  Outcome: Ongoing (see interventions/notes)     Problem: Pain Acute  Goal: Optimal Pain Control  Outcome: Ongoing (see interventions/notes)

## 2018-05-09 DIAGNOSIS — I25810 Atherosclerosis of coronary artery bypass graft(s) without angina pectoris: Secondary | ICD-10-CM

## 2018-05-09 DIAGNOSIS — Z7901 Long term (current) use of anticoagulants: Secondary | ICD-10-CM

## 2018-05-09 DIAGNOSIS — R079 Chest pain, unspecified: Secondary | ICD-10-CM

## 2018-05-09 LAB — MAGNESIUM: MAGNESIUM: 2 mg/dL (ref 1.7–2.2)

## 2018-05-09 LAB — BASIC METABOLIC PANEL, FASTING
ANION GAP: 7 mmol/L
BUN/CREA RATIO: 16
BUN: 23 mg/dL — ABNORMAL HIGH (ref 9–21)
CALCIUM: 9.3 mg/dL (ref 8.5–10.3)
CHLORIDE: 109 mmol/L (ref 101–111)
CO2 TOTAL: 26 mmol/L (ref 22–31)
CREATININE: 1.4 mg/dL (ref 0.70–1.40)
ESTIMATED GFR: 51 mL/min/1.73mˆ2 — ABNORMAL LOW (ref >=60)
GLUCOSE: 92 mg/dL (ref 68–99)
POTASSIUM: 4.4 mmol/L (ref 3.6–5.0)
SODIUM: 142 mmol/L (ref 137–145)

## 2018-05-09 LAB — EXTENDED RESPIRATORY VIRUS PANEL
ADENOVIRUS ARRAY: NOT DETECTED
BORDETELLA PERTUSSIS ARRAY: NOT DETECTED
CORONAVIRUS HKU1: NOT DETECTED
CORONAVIRUS NL63: NOT DETECTED
INFLUENZA B ARRAY: NOT DETECTED
MYCOPLASMA PNEUMONIAE ARRAY: NOT DETECTED
RHINOVIRUS/ENTEROVIRUS ARRAY: DETECTED — AB
RSV ARRAY: NOT DETECTED

## 2018-05-09 LAB — RESPIRATORY VIRUS PANEL RAPID WITH REFLEX TO EXTENDED BIOFIRE PCR PANEL
INFLUENZA A ANTIGEN: NOT DETECTED
INFLUENZA B ANTIGEN: NOT DETECTED

## 2018-05-09 LAB — CBC WITH DIFF
BASOPHIL #: 0.1 x10ˆ3/uL (ref 0.00–1.00)
BASOPHIL %: 1 % (ref 0–1)
EOSINOPHIL #: 0.3 x10ˆ3/uL (ref 0.00–0.40)
EOSINOPHIL %: 6 % — ABNORMAL HIGH (ref 1–5)
HCT: 40 % — ABNORMAL LOW (ref 42.0–52.0)
HGB: 12.9 g/dL — ABNORMAL LOW (ref 14.0–18.0)
LYMPHOCYTE #: 1.7 x10ˆ3/uL (ref 1.00–4.00)
LYMPHOCYTE %: 34 % (ref 25–40)
MCH: 28.7 pg (ref 27.0–31.0)
MCH: 28.7 pg (ref 27.0–31.0)
MCHC: 32.2 g/dL — ABNORMAL LOW (ref 33.0–37.0)
MCV: 89 fL (ref 80.0–94.0)
MONOCYTE #: 0.6 x10ˆ3/uL (ref 0.10–0.80)
MONOCYTE %: 13 % — ABNORMAL HIGH (ref 4–10)
NEUTROPHIL #: 2.4 x10ˆ3/uL (ref 1.80–7.00)
NEUTROPHIL %: 47 % — ABNORMAL LOW (ref 50–73)
PLATELETS: 193 x10ˆ3/uL (ref 130–400)
RBC: 4.49 x10ˆ6/uL — ABNORMAL LOW (ref 4.70–6.10)
RDW: 15.3 % — ABNORMAL HIGH (ref 11.5–14.5)
WBC: 5.1 x10ˆ3/uL (ref 4.8–10.8)

## 2018-05-09 LAB — RESPIRATORY VIRUS PANEL
CHLAMYDOPHILA PNEUMONIAE ARRAY: NOT DETECTED
CORONAVIRUS 229E: NOT DETECTED
CORONAVIRUS OC43: NOT DETECTED
INFLUENZA A: NOT DETECTED
METAPNEUMOVIRUS ARRAY: NOT DETECTED
PARAINFLUENZA 1 ARRAY: NOT DETECTED
PARAINFLUENZA 2 ARRAY: NOT DETECTED
PARAINFLUENZA 3 ARRAY: NOT DETECTED
PARAINFLUENZA 4 ARRAY: NOT DETECTED

## 2018-05-09 LAB — RESPIRATORY VIRUS PANEL BY BIOFIRE FILM ARRAY
CORONAVIRUS NL63: NOT DETECTED
METAPNEUMOVIRUS ARRAY: NOT DETECTED

## 2018-05-09 LAB — PT/INR
INR: 2.47 (ref <=4.00)
PROTHROMBIN TIME: 28.7 s — ABNORMAL HIGH (ref 9.7–13.6)

## 2018-05-09 LAB — RAPID THROAT SCREEN, STREPTOCOCCUS, WITH REFLEX: THROAT RAPID SCREEN, STREPTOCOCCUS: NOT DETECTED

## 2018-05-09 MED ORDER — ALLOPURINOL 100 MG TABLET
100.0000 mg | ORAL_TABLET | Freq: Every day | ORAL | Status: DC
Start: 2018-05-09 — End: 2018-05-11
  Administered 2018-05-09 – 2018-05-11 (×4): 100 mg via ORAL
  Filled 2018-05-09 (×3): qty 1

## 2018-05-09 MED ORDER — PHENOL 1.4 % MUCOSAL AEROSOL SPRAY
1.00 | INHALATION_SPRAY | Status: DC | PRN
Start: 2018-05-09 — End: 2018-05-11
  Administered 2018-05-09 – 2018-05-10 (×2): 1 via OROMUCOSAL
  Filled 2018-05-09: qty 177

## 2018-05-09 MED ORDER — FLUTICASONE PROPIONATE 50 MCG/ACTUATION NASAL SPRAY,SUSPENSION
1.00 | Freq: Two times a day (BID) | NASAL | Status: DC
Start: 2018-05-09 — End: 2018-05-11
  Administered 2018-05-09 – 2018-05-11 (×6): 1 via NASAL
  Filled 2018-05-09: qty 16

## 2018-05-09 MED ORDER — LORATADINE 10 MG TABLET
10.0000 mg | ORAL_TABLET | Freq: Every day | ORAL | Status: DC
Start: 2018-05-09 — End: 2018-05-11
  Administered 2018-05-09 – 2018-05-11 (×3): 10 mg via ORAL
  Filled 2018-05-09 (×3): qty 1

## 2018-05-09 MED ORDER — SODIUM CHLORIDE 0.9 % INTRAVENOUS SOLUTION
INTRAVENOUS | Status: DC
Start: 2018-05-09 — End: 2018-05-11

## 2018-05-09 MED ADMIN — sulfamethoxazole 200 mg-trimethoprim 40 mg/5 mL oral suspension: NASAL

## 2018-05-09 MED ADMIN — dextrose 5 % and lactated ringers intravenous solution: ORAL | @ 10:00:00

## 2018-05-09 MED ADMIN — dextrose 5 % and 0.45 % sodium chloride intravenous solution: ORAL | @ 16:00:00 | NDC 00338008504

## 2018-05-09 NOTE — Nurses Notes (Signed)
ASSESSMENT COMPLETED VIA FLOWSHEET.  DENIES COMPLAINTS OR WANTS.  NO S/S OF DISTRESS NOTED.  PLEASANT.  ALERT AND ORIENTED X 4.  CALL LIGHT IN REACH.  MOVES ALL EXTREMITIES.  FAMILY IN ROOM.

## 2018-05-09 NOTE — Care Plan (Signed)
Problem: Adult Inpatient Plan of Care  Goal: Plan of Care Review  Outcome: Ongoing (see interventions/notes)  Goal: Patient-Specific Goal (Individualization)  Outcome: Ongoing (see interventions/notes)  Goal: Absence of Hospital-Acquired Illness or Injury  Outcome: Ongoing (see interventions/notes)  Goal: Optimal Comfort and Wellbeing  Outcome: Ongoing (see interventions/notes)  Goal: Rounds/Family Conference  Outcome: Ongoing (see interventions/notes)     Problem: Adjustment to Illness (Acute Coronary Syndrome)  Goal: Optimal Adaptation to Illness  Outcome: Ongoing (see interventions/notes)     Problem: Arrhythmia (Acute Coronary Syndrome)  Goal: Normalized Cardiac Rhythm  Outcome: Ongoing (see interventions/notes)     Problem: Pain (Acute Coronary Syndrome)  Goal: Absence of Cardiac-Related Pain  Outcome: Ongoing (see interventions/notes)     Problem: Tissue Perfusion (Acute Coronary Syndrome)  Goal: Adequate Tissue Perfusion  Outcome: Ongoing (see interventions/notes)     Problem: Pain Acute  Goal: Optimal Pain Control  Outcome: Ongoing (see interventions/notes)   LYING IN BED.  DENIES COMPLAINTS OR WANTS.  NO S/S OF DISTRESS NOTED.  CALL LIGHT IN REACH.  SIDERAILS UP X 2.  PT ALERT AND ORIENTED X 4.  EXPLAINED TO PT TO NOT EAT BREAKFAST IN A.M FOR THE POSSIBILITY OF CARDIAC CATH.  PT AND WIFE VERBALIZED UNDERSTANDING.

## 2018-05-09 NOTE — Care Plan (Signed)
At this time pt is lying in bed resting quietly with eyes closed, respirations easy on room air. No s/sx of pain or distress noted, will continue to monitor. For further details please see flow sheets.      Problem: Adult Inpatient Plan of Care  Goal: Plan of Care Review  Outcome: Ongoing (see interventions/notes)  Goal: Patient-Specific Goal (Individualization)  Outcome: Ongoing (see interventions/notes)  Goal: Absence of Hospital-Acquired Illness or Injury  Outcome: Ongoing (see interventions/notes)  Goal: Optimal Comfort and Wellbeing  Outcome: Ongoing (see interventions/notes)  Goal: Rounds/Family Conference  Outcome: Ongoing (see interventions/notes)     Problem: Adjustment to Illness (Acute Coronary Syndrome)  Goal: Optimal Adaptation to Illness  Outcome: Ongoing (see interventions/notes)     Problem: Arrhythmia (Acute Coronary Syndrome)  Goal: Normalized Cardiac Rhythm  Outcome: Ongoing (see interventions/notes)     Problem: Pain (Acute Coronary Syndrome)  Goal: Absence of Cardiac-Related Pain  Outcome: Ongoing (see interventions/notes)     Problem: Tissue Perfusion (Acute Coronary Syndrome)  Goal: Adequate Tissue Perfusion  Outcome: Ongoing (see interventions/notes)     Problem: Pain Acute  Goal: Optimal Pain Control  Outcome: Ongoing (see interventions/notes)

## 2018-05-09 NOTE — Nurses Notes (Signed)
Pt lying semi fowler sin bed alert and oriented x4, respirations easy on room air. Denies any pain. Pt given supplies and instructions for surgical bath, denies any needs at this time. For further details please see flow sheets.

## 2018-05-09 NOTE — Progress Notes (Signed)
[name]  Cardiology Inpatient Progress Note        Dustin Webster, Dustin Webster  Date of Admission:  05/07/2018  Date of service: 05/09/2018  Date of Birth:  05/25/1956  MRN:  [id number]    Hospital Day:  LOS: 1 day   Subjective:  This is a 62 year old white male who has a history of coronary artery disease and is status post coronary artery bypass grafting.  The patient did undergo stress testing yesterday in which she had a hypotensive response to exercise.  The patient is going to be having a left heart catheterization when his INR is decreased.    Objective:     Vital Signs:  Temp (24hrs) Max:37 C (98.6 F)      Temperature: 36.2 C (97.1 F) (05/09/18 0826)  BP (Non-Invasive): 101/67 (05/09/18 0826)  MAP (Non-Invasive): 87 mmHG (05/07/18 1330)  Heart Rate: 63 (05/09/18 0826)  Respiratory Rate: 18 (05/09/18 0826)  Pain Score (Numeric, Faces): 0 (05/09/18 0000)  SpO2: 98 % (05/09/18 0826)    Physical Exam:  General: no acute distress and alert  Eyes: Conjunctiva clear, PERRLA  HENT:ENT without erythema or injection, mucouse membranes moist., Nose without erythema.  No oral lesions.   Neck: No JVD, no carotid bruit. and no adenopathy  Lungs: clear to auscultation bilaterally.  and normal percussion bilaterally  Cardiovascular:    Heart regular rate and rhythm, S1, S2 normal and no murmurs  Abdomen: soft, non-tender, bowel sounds normal, non-distended, no hepatosplenomegaly and no masses  Extremities: No edema or cyanosis, no ulcers, gangrene or trophic changes  Skin: Skin warm and dry, No rashes and No lesions  Neurologic: grossly normal, normal mental status, sensory, motor, cranial nerves, reflexes, coordination, and gait and mental status: alert, oriented, thought content appropriate  Lymphatics: no lymphadenopathy  Psychiatric: normal affect, behavior, memory, thought content, judgement, and speech.                Current Medications:    Current Facility-Administered Medications:  acetaminophen  (TYLENOL) tablet 500 mg Oral 3x/day   aspirin (ECOTRIN) enteric coated tablet 81 mg 81 mg Oral Daily   calcium carbonate 1530m (6072mof elemental calcium) tablet 600 mg Oral Daily   citalopram (CELEXA) tablet 10 mg Oral Daily   famotidine (PEPCID) tablet 20 mg Oral 2x/day   fenofibrate micronized (LOFIBRA) capsule 134 mg Oral Daily   ferrous sulfate 325 mg (elemental IRON 65 mg) tablet 325 mg Oral 2x/day   fluticasone (FLONASE) 50 mcg per spray nasal spray 1 Spray Each Nostril 2x/day   ipratropium-albuterol 0.5 mg-3 mg(2.5 mg base)/3 mL Solution for Nebulization 3 mL Nebulization Q4H PRN   loratadine (CLARITIN) tablet 10 mg Oral Daily   magnesium hydroxide (MILK OF MAGNESIA) 40076mer 5mL85mal liquid 15 mL Oral Daily PRN   multivitamin-iron-folic acid-calcium-minerals (THERA-M PLUS) tablet 1 Tab Oral Daily   nitroGLYCERIN (NITROSTAT) sublingual tablet 0.4 mg Sublingual Q5 Min PRN   NS flush syringe 3 mL Intracatheter Q8HRS   NS flush syringe 3 mL Intracatheter Q1H PRN   NS premix infusion  Intravenous Continuous   ondansetron (ZOFRAN) 2 mg/mL injection 4 mg Intravenous Q6H PRN   tamsulosin (FLOMAX) capsule 0.4 mg Oral Daily after Dinner       Allergies   Allergen Reactions   . Gluten      Celiac disease         Assessment/ Plan:  1. Chest pain:  The patient did have a stress  test performed yesterday which she had a hypotensive response to exercise.  We are going to be doing a left heart catheterization prior to the patient being discharged.  The patient is currently off of his Coumadin and heparin will need to be started when the INR is less than 2.0.  We will consider doing the catheterization soon after this.  The patient will continue to be monitor closely on telemetry.              Landry Corporal, MD 05/09/2018, 09:50

## 2018-05-09 NOTE — Nurses Notes (Signed)
Pt reports a dry irritated throat causing him to cough frequently. Requesting throat lozenge. Notified Dr. Charolotte Capuchin, new order received.

## 2018-05-09 NOTE — Nurses Notes (Signed)
Pt sitting up in bed alert and oriented x4, respirations easy on room air. Denies any pain or needs at this time. For further details please see flow sheets.

## 2018-05-09 NOTE — Nurses Notes (Signed)
ASSESSMENT COMPLETED VIA FLOWSHEET.  DENIES COMPLAINTS OR WANTS.  NO S/S OF DISTRESS NOTED.  PLEASANT.  ALERT AND ORIENTED X 4.  CALL LIGHT IN REACH.  MOVES ALL EXTREMITIES.

## 2018-05-09 NOTE — Progress Notes (Signed)
[facility]  HOSPITALIST PROGRESS NOTE      Dustin Webster                     62 y.o. male 319/A    Date of service: 05/09/2018      Date of Admission:  05/07/2018   Code Status: Full Code      Assessments:  1. Chest pain, atypical  2. Hypotensive episode  3. Upper respiratory symptoms  4. Hx CAD s/p CABG  5. 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 CKD stage III at baseline  7. GERD  8. Chronic anemia, stable      Plan:   Patient underwent cardiac stress test on 06/25, however had significant hypotension with SBP into the 60s, came up with a 350 cc normal saline bolus while down in nuclear med.  Patient is awaiting INR to go below 2, then begin heparin drip, then heart catheterization thereafter.  Continue close monitoring on telemetry, daily PT INR.  Continue aspirin, Lofibra, nitroglycerin p.r.n.    Patient's home amlodipine being held, while on normal saline fluids 75 cc/hour, blood pressure has been stable and mildly soft.  Continue close monitoring with vital signs q.4 hours.   Awaiting respiratory viral panel and throat culture to be done.  Will begin Flonase and Claritin.   All other comorbidities stable at this time.  Trend daily CBC, BMP, magnesium in INR per above.    Subjective / Interval history:  This is 62 y.o.  White male who presents with PMH significant for CAD/CABG, HTN, HLD, CKD III, PE/DVT on warfarin s/p IVC filter removal 01/2017, chronic anemia who presents for chest pain.  Of note, patient's Protonix was recently changed to Zantac by nephrologist and patient was seen by his PCP on 06/21 with complaints of chest and epigastric pain as well as increased belching.  Patient's symptoms continued therefore he came to hospital and was admitted for further workup and management.  Patient underwent cardio stress test on 05/08/2018 where patient had significant hypotensive episode after stress test and cardiologist wished to take patient for emergent heart catheterization, however patient  has been on Coumadin and INR was above 2 therefore patient was brought to floor to await INR to trend down prior to heart catheterization.  Cardiology is following.    Patient seen and evaluated independently of Dr. Shawn Stall at bedside.  Patient states that he feels good today, chest pain has completely eased up he states.  Patient aware that they are waiting for his INR to go down.  He states his nose feels "stingy"and continued sore throat.  He further denies fever, chills, N/V/D, associated shortness of breath or abdominal pain.         PHYSICAL EXAM:  VITALS: BP 101/67   Pulse 63   Temp 36.2 C (97.1 F)   Resp 18   Ht 1.905 m (6\' 3" )   Wt 99.5 kg (219 lb 6.4 oz)   SpO2 98%   BMI 27.42 kg/m     GENERAL:   Pt is a pleasant male who appears stated age and is sitting up in bed in no acute distress.   Constitutional: appears in good health.  Eyes: Conjunctiva clear.  ENT: ENMT without erythema or injection, mucous membranes moist.  Neck: no thyromegaly, trachea midline.   Respiratory: Clear to auscultation bilaterally.   Cardiovascular: regular rate and rhythm.  Gastrointestinal: Soft, non-tender.  Genitourinary: Deferred  Musculoskeletal: AROM WNL of bilateral UE  and LE.  Integumentary:  Skin warm and dry.  Psych: A&Ox3      DVT prophylaxis:  Holding 2/2 upcoming heart catheterization, once INR is below 2, heparinize  Disposition planning:  Home      CBC Results Differential Results   Recent Labs     05/08/18  0313 05/09/18  0425   WBC 5.0 5.1   HGB 12.8* 12.9*   HCT 39.2* 40.0*   PLTCNT 186 193      Recent Labs     05/08/18  0313 05/09/18  0425   LYMPHOCYTES 38 34   MONOCYTES 12* 13*   EOSINOPHIL 6* 6*   PMNABS 2.10 2.40   LYMPHSABS 1.90 1.70   MONOSABS 0.60 0.60   EOSABS 0.30 0.30          BMP Results Other Chemistries Results   Recent Labs     05/08/18  0314 05/09/18  0425   SODIUM 142 142   POTASSIUM 4.4 4.4   CHLORIDE 108 109   CO2 28 26   BUN 18 23*   CREATININE 1.60* 1.40   GFR 44* 51*   GLUCOSENF 95  92        Recent Labs     05/07/18  1303 05/08/18  0314 05/09/18  0425   CALCIUM 10.0 9.9 9.3   ALBUMIN 4.8 4.3  --    MAGNESIUM  --  2.0 2.0        Liver/Pancreas Enzyme Results ABG results   Recent Labs     05/08/18  0314   TOTBILIRUBIN 0.5   AST 27   ALT 26   ALKPHOS 39   TOTALPROTEIN 7.0   ALBUMIN 4.3        No results found for this encounter   Cardiac Results    Coag Results   Recent Labs     05/07/18  1228 05/07/18  1511 05/07/18  1914   UHCEASTTROPI <0.01 <0.01 <0.01      Recent Labs     05/07/18  1303 05/08/18  0313 05/09/18  0425   PROTHROMTME 25.7* 29.7* 28.7*   INR 2.22 2.56 2.47   APTT 44.8*  --   --           Lipid Panel Toxicology   No results for input(s): CHOLESTEROL, TRIG, HDLCHOL, LDLCHOL in the last 72 hours. No results for input(s): ETHANOL, UAMPTN, UBARBN, [place], [place], [place], [place], [place], [place], PROPOXYPHEUR in the last 72 hours.         UA Cultures   No results for input(s): CHARACTER, COLOR, SPECGRAVUR, PHURINE, GLUCOSE, KETONES, UROBILINOGEN, LEUKOCYTES, NITRITE, EPITHCEL, BACTERIA, MUCOUS, AMORPCRY, AMORPHSED, CASTS, BLOOD in the last 72 hours.    Invalid input(s): PROTIEN     No results found for any visits on 05/07/18 (from the past 96 hour(s)).       Other Micro pathology       No results found for this visit on 05/07/18.           Pending Labs and Procedures     Order Current Status    TRANSTHORACIC ECHOCARDIOGRAM - ADULT In process                 Imaging:  Results for orders placed or performed during the hospital encounter of 05/07/18 (from the past 72 hour(s))   XR AP MOBILE CHEST     Status: None    Narrative    Male, 62 years old.    XR AP  MOBILE CHEST performed on 05/07/2018 1:26 PM.    REASON FOR EXAM:  CHEST PAIN    TECHNIQUE: 1 views/1 images submitted for interpretation.    COMPARISON:  09/19/2016    FINDINGS:  The patient is status post sternotomy. The heart is top normal  as to size. The pulmonary vascularity is within normal limits.    The lungs are expanded and no  focal consolidative process is seen in the  lungs. No pleural effusion. No pneumothorax.      Impression    1. No acute cardiopulmonary process is seen.        Radiologist location ID: [id number]           Inpatient Medications:    Current Facility-Administered Medications:  acetaminophen (TYLENOL) tablet 500 mg Oral 3x/day   aspirin (ECOTRIN) enteric coated tablet 81 mg 81 mg Oral Daily   calcium carbonate 1500mg  (600mg  of elemental calcium) tablet 600 mg Oral Daily   citalopram (CELEXA) tablet 10 mg Oral Daily   famotidine (PEPCID) tablet 20 mg Oral 2x/day   fenofibrate micronized (LOFIBRA) capsule 134 mg Oral Daily   ferrous sulfate 325 mg (elemental IRON 65 mg) tablet 325 mg Oral 2x/day   fluticasone (FLONASE) 50 mcg per spray nasal spray 1 Spray Each Nostril 2x/day   ipratropium-albuterol 0.5 mg-3 mg(2.5 mg base)/3 mL Solution for Nebulization 3 mL Nebulization Q4H PRN   loratadine (CLARITIN) tablet 10 mg Oral Daily   magnesium hydroxide (MILK OF MAGNESIA) 400mg  per 66mL oral liquid 15 mL Oral Daily PRN   multivitamin-iron-folic acid-calcium-minerals (THERA-M PLUS) tablet 1 Tab Oral Daily   nitroGLYCERIN (NITROSTAT) sublingual tablet 0.4 mg Sublingual Q5 Min PRN   NS flush syringe 3 mL Intracatheter Q8HRS   NS flush syringe 3 mL Intracatheter Q1H PRN   NS premix infusion  Intravenous Continuous   ondansetron (ZOFRAN) 2 mg/mL injection 4 mg Intravenous Q6H PRN   tamsulosin (FLOMAX) capsule 0.4 mg Oral Daily after Dinner      ______________________________________________________________________      Brock Bad, PA-C    This patient was seen and evaluated independently of the attending physician. All aspects of the care plan were discussed in detail with her/him and she/he is in agreement with the care plan as stated above.    This note may have been partially generated using MModal Fluency Direct system, and there may be some incorrect words, spellings, and punctuation that were not noted in checking the note  before saving.    Patient denies any chest pain or shortness of breath today.  Does complain of some nasal congestion and cough.  Denies any nausea or vomiting.  Blood pressures improved.  Creatinine is now at baseline.  Creatinine in November of 2027 was 1.4 and creatinine today is 1.4.  INR is 2.47.  Echo is pending.  Lungs were clear to auscultation bilaterally.  Heart was regular rate and rhythm.  Abdomen was soft nontender.  Holding Coumadin.  If INR less than 2 cardiology will do left heart catheterization tomorrow.

## 2018-05-10 ENCOUNTER — Encounter (HOSPITAL_BASED_OUTPATIENT_CLINIC_OR_DEPARTMENT_OTHER): Payer: Self-pay | Admitting: Cardiovascular Disease

## 2018-05-10 ENCOUNTER — Ambulatory Visit (HOSPITAL_BASED_OUTPATIENT_CLINIC_OR_DEPARTMENT_OTHER): Payer: Self-pay

## 2018-05-10 DIAGNOSIS — R9439 Abnormal result of other cardiovascular function study: Secondary | ICD-10-CM

## 2018-05-10 LAB — TYPE AND SCREEN
ABO/RH(D): A POS
ANTIBODY SCREEN: NEGATIVE

## 2018-05-10 LAB — PT/INR
INR: 1.79 (ref <=4.00)
INR: 1.66 (ref <=4.00)
INR: 1.65 (ref <=4.00)
PROTHROMBIN TIME: 20.8 s — ABNORMAL HIGH (ref 9.7–13.6)
PROTHROMBIN TIME: 19.3 s — ABNORMAL HIGH (ref 9.7–13.6)
PROTHROMBIN TIME: 19.1 s — ABNORMAL HIGH (ref 9.7–13.6)

## 2018-05-10 LAB — BASIC METABOLIC PANEL, FASTING
ANION GAP: 9 mmol/L
BUN/CREA RATIO: 15
BUN: 21 mg/dL (ref 9–21)
CALCIUM: 9.6 mg/dL (ref 8.5–10.3)
CHLORIDE: 109 mmol/L (ref 101–111)
CHLORIDE: 109 mmol/L (ref 101–111)
CO2 TOTAL: 25 mmol/L (ref 22–31)
CREATININE: 1.4 mg/dL (ref 0.70–1.40)
ESTIMATED GFR: 51 mL/min/1.73mˆ2 — ABNORMAL LOW (ref >=60)
GLUCOSE: 92 mg/dL (ref 68–99)
POTASSIUM: 4.5 mmol/L (ref 3.6–5.0)
SODIUM: 143 mmol/L (ref 137–145)

## 2018-05-10 LAB — CBC WITH DIFF
BASOPHIL #: 0 x10ˆ3/uL (ref 0.00–1.00)
BASOPHIL %: 1 % (ref 0–1)
EOSINOPHIL #: 0.4 x10ˆ3/uL (ref 0.00–0.40)
EOSINOPHIL %: 6 % — ABNORMAL HIGH (ref 1–5)
HCT: 38.7 % — ABNORMAL LOW (ref 42.0–52.0)
HGB: 12.5 g/dL — ABNORMAL LOW (ref 14.0–18.0)
LYMPHOCYTE #: 1.4 10*3/uL (ref 1.00–4.00)
LYMPHOCYTE %: 24 % — ABNORMAL LOW (ref 25–40)
MCH: 28.7 pg (ref 27.0–31.0)
MCHC: 32.3 g/dL — ABNORMAL LOW (ref 33.0–37.0)
MCV: 88.9 fL (ref 80.0–94.0)
MONOCYTE #: 0.6 10*3/uL (ref 0.10–0.80)
MONOCYTE %: 11 % — ABNORMAL HIGH (ref 4–10)
NEUTROPHIL #: 3.5 x10ˆ3/uL (ref 1.80–7.00)
NEUTROPHIL %: 59 % (ref 50–73)
PLATELETS: 194 x10ˆ3/uL (ref 130–400)
RBC: 4.36 x10ˆ6/uL — ABNORMAL LOW (ref 4.70–6.10)
RDW: 15.3 % — ABNORMAL HIGH (ref 11.5–14.5)
WBC: 5.9 10*3/uL (ref 4.8–10.8)

## 2018-05-10 LAB — PTT (PARTIAL THROMBOPLASTIN TIME)
APTT: 33.4 s (ref 26.0–39.0)
APTT: 106.2 s — ABNORMAL HIGH (ref 26.0–39.0)
APTT: 106.2 seconds — ABNORMAL HIGH (ref 26.0–39.0)

## 2018-05-10 LAB — MAGNESIUM: MAGNESIUM: 2 mg/dL (ref 1.7–2.2)

## 2018-05-10 MED ORDER — DEXTROMETHORPHAN-GUAIFENESIN 10 MG-100 MG/5 ML ORAL SYRUP
10.00 mL | ORAL_SOLUTION | ORAL | Status: DC | PRN
Start: 2018-05-10 — End: 2018-05-11
  Administered 2018-05-10: 10 mL via ORAL
  Filled 2018-05-10: qty 10

## 2018-05-10 MED ORDER — HEPARIN (PORCINE) 25,000 UNIT/250 ML (100 UNIT/ML) IN DEXTROSE 5 % IV
12.0000 [IU]/kg/h | INTRAVENOUS | Status: DC
Start: 2018-05-10 — End: 2018-05-10

## 2018-05-10 MED ORDER — GUAIFENESIN ER 600 MG TABLET, EXTENDED RELEASE 12 HR
600.00 mg | EXTENDED_RELEASE_TABLET | Freq: Two times a day (BID) | ORAL | Status: DC
Start: 2018-05-10 — End: 2018-05-11
  Administered 2018-05-10 – 2018-05-11 (×4): 600 mg via ORAL
  Filled 2018-05-10 (×3): qty 1

## 2018-05-10 MED ORDER — DIAZEPAM 5 MG TABLET
5.0000 mg | ORAL_TABLET | ORAL | Status: DC
Start: 2018-05-11 — End: 2018-05-11
  Administered 2018-05-11: 5 mg via ORAL
  Filled 2018-05-10: qty 1

## 2018-05-10 MED ORDER — HEPARIN (PORCINE) 5,000 UNITS/ML BOLUS
60.0000 [IU]/kg | Freq: Once | INTRAMUSCULAR | Status: DC
Start: 2018-05-10 — End: 2018-05-10

## 2018-05-10 MED ORDER — DIPHENHYDRAMINE 25 MG CAPSULE
25.0000 mg | ORAL_CAPSULE | ORAL | Status: DC
Start: 2018-05-11 — End: 2018-05-11
  Administered 2018-05-11: 25 mg via ORAL
  Filled 2018-05-10: qty 1

## 2018-05-10 MED ORDER — HEPARIN (PORCINE) 25,000 UNIT/250 ML (100 UNIT/ML) IN DEXTROSE 5 % IV
11.0500 [IU]/kg/h | INTRAVENOUS | Status: DC
Start: 2018-05-10 — End: 2018-05-11
  Administered 2018-05-10: 8.05 [IU]/kg/h via INTRAVENOUS
  Administered 2018-05-10: 11.05 [IU]/kg/h via INTRAVENOUS
  Administered 2018-05-11: 9.03 [IU]/kg/h via INTRAVENOUS
  Filled 2018-05-10 (×4): qty 250

## 2018-05-10 MED ORDER — SODIUM CHLORIDE 0.45 % INTRAVENOUS SOLUTION
INTRAVENOUS | Status: DC
Start: 2018-05-11 — End: 2018-05-11

## 2018-05-10 MED ORDER — SODIUM CHLORIDE 0.9 % (FLUSH) INJECTION SYRINGE
3.0000 mL | INJECTION | Freq: Three times a day (TID) | INTRAMUSCULAR | Status: DC
Start: 2018-05-10 — End: 2018-05-11
  Administered 2018-05-10 (×2): 0 mL

## 2018-05-10 MED ORDER — HEPARIN (PORCINE) 5,000 UNITS/ML BOLUS
55.0000 [IU]/kg | Freq: Once | INTRAMUSCULAR | Status: AC
Start: 2018-05-10 — End: 2018-05-10
  Administered 2018-05-10: 5000 [IU] via INTRAVENOUS
  Filled 2018-05-10: qty 1

## 2018-05-10 MED ORDER — SODIUM CHLORIDE 0.9 % (FLUSH) INJECTION SYRINGE
3.0000 mL | INJECTION | INTRAMUSCULAR | Status: DC | PRN
Start: 2018-05-10 — End: 2018-05-11

## 2018-05-10 MED ADMIN — cyanocobalamin (vit B-12) 1,000 mcg/mL injection solution: ORAL | @ 09:00:00

## 2018-05-10 MED ADMIN — sodium chloride 0.9 % intravenous solution: INTRAVENOUS | @ 16:00:00 | NDC 00338004904

## 2018-05-10 MED ADMIN — lactated Ringers intravenous solution: @ 22:00:00 | NDC 00264775000

## 2018-05-10 MED ADMIN — potassium chloride ER 20 mEq tablet,extended release(part/cryst): @ 22:00:00

## 2018-05-10 NOTE — Progress Notes (Signed)
[place]  Cardiology Inpatient Progress Note        Dustin Webster, Dustin Webster  Date of Admission:  05/07/2018  Date of service: 05/10/2018  Date of Birth:  [birth year]  MRN:  [id number]    Hospital Day:  LOS: 2 days   Subjective:  Patient had no significant issues overnight.  He denied chest pain, shortness of breath, palpitations, orthopnea, or PND.    Objective:     Vital Signs:  Temp (24hrs) Max:36.9 C (98.4 F)      Temperature: 36.4 C (97.6 F) (05/10/18 0740)  BP (Non-Invasive): 101/65 (05/10/18 0740)  MAP (Non-Invasive): 87 mmHG (05/07/18 1330)  Heart Rate: 61 (05/10/18 0740)  Respiratory Rate: 17 (05/10/18 0740)  Pain Score (Numeric, Faces): 0 (05/10/18 0815)  SpO2: 97 % (05/10/18 0740)    Physical Exam:  General: no acute distress and alert  Eyes: Conjunctiva clear, PERRLA  HENT:ENT without erythema or injection, mucouse membranes moist., Nose without erythema.  No oral lesions.   Neck: No JVD, no carotid bruit. and no adenopathy  Lungs: clear to auscultation bilaterally.  and normal percussion bilaterally  Cardiovascular:    Heart regular rate and rhythm, S1, S2 normal and no murmurs  Abdomen: soft, non-tender, bowel sounds normal, non-distended, no hepatosplenomegaly and no masses  Extremities: No edema or cyanosis, no ulcers, gangrene or trophic changes  Skin: Skin warm and dry, No rashes and No lesions  Neurologic: grossly normal, normal mental status, sensory, motor, cranial nerves, reflexes, coordination, and gait and mental status: alert, oriented, thought content appropriate  Lymphatics: no lymphadenopathy  Psychiatric: normal affect, behavior, memory, thought content, judgement, and speech.                Current Medications:    Current Facility-Administered Medications:  acetaminophen (TYLENOL) tablet 500 mg Oral 3x/day   allopurinol (ZYLOPRIM) tablet 100 mg Oral Daily   aspirin (ECOTRIN) enteric coated tablet 81 mg 81 mg Oral Daily   calcium carbonate 1568m (6036mof elemental  calcium) tablet 600 mg Oral Daily   citalopram (CELEXA) tablet 10 mg Oral Daily   dextromethorphan-guaiFENesin (ROBITUSSIN DM) 10-1001mer 5mL74mal liquid 10 mL Oral Q4H PRN   famotidine (PEPCID) tablet 20 mg Oral 2x/day   fenofibrate micronized (LOFIBRA) capsule 134 mg Oral Daily   ferrous sulfate 325 mg (elemental IRON 65 mg) tablet 325 mg Oral 2x/day   fluticasone (FLONASE) 50 mcg per spray nasal spray 1 Spray Each Nostril 2x/day   guaiFENesin (MUCINEX) extended release tablet 600 mg Oral 2x/day   heparin 25,000 units in D5W 250 mL infusion 11.05 Units/kg/hr (Adjusted) Intravenous Continuous   ipratropium-albuterol 0.5 mg-3 mg(2.5 mg base)/3 mL Solution for Nebulization 3 mL Nebulization Q4H PRN   loratadine (CLARITIN) tablet 10 mg Oral Daily   magnesium hydroxide (MILK OF MAGNESIA) 400mg75m 5mL o60m liquid 15 mL Oral Daily PRN   multivitamin-iron-folic acid-calcium-minerals (THERA-M PLUS) tablet 1 Tab Oral Daily   nitroGLYCERIN (NITROSTAT) sublingual tablet 0.4 mg Sublingual Q5 Min PRN   NS flush syringe 3 mL Intracatheter Q8HRS   NS flush syringe 3 mL Intracatheter Q1H PRN   NS premix infusion  Intravenous Continuous   ondansetron (ZOFRAN) 2 mg/mL injection 4 mg Intravenous Q6H PRN   phenol (CHLORASEPTIC) 1.4% oromucosal spray 1 Spray Mouth/Throat Q4H PRN   tamsulosin (FLOMAX) capsule 0.4 mg Oral Daily after Dinner       Allergies   Allergen Reactions   . Gluten  Celiac disease         Assessment/ Plan:  1. Abnormal blood pressure response to exercise with chest pain:  Patient will be scheduled for left heart catheterization.  Patient's INR was still to elevated today.  The patient was apprised of the risks and benefits of the procedure and is agreeable to proceed.  Further recommendations will be based on the catheterization findings.  Following the procedure the patient will need bridging with his anticoagulation.  Could consider Lovenox at discharge restart his Coumadin with close follow-up in the  Coumadin Clinic.              Landry Corporal, MD 05/10/2018, 13:32

## 2018-05-10 NOTE — Progress Notes (Signed)
[facility]  HOSPITALIST PROGRESS NOTE      Dustin Webster                     62 y.o. male 319/A    Date of service: 05/10/2018      Date of Admission:  05/07/2018   Code Status: Full Code      Assessments:  1. Chest pain, atypical  2. Hypotensive episode, resolved  3. Upper respiratory symptoms/positive for rhino virus  4. Hx CAD s/p CABG  5. 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 CKD stage III at baseline  7. GERD  8. Chronic anemia, stable  9. Abnormal stress test      Plan:   Patient underwent cardiac stress test on 06/25, however had significant hypotension with SBP into the 60s, came up with a 350 cc normal saline bolus while down in nuclear med.  Patient's INR is now 1.79.  He is on heparin drip. Continue close monitoring on telemetry, daily PT INR.  Continue aspirin, Lofibra, nitroglycerin p.r.n. Await left heart catheterization   Patient's home amlodipine being held, while on normal saline fluids 55 cc/hour, blood pressure has been stable and mildly soft.  Continue close monitoring with vital signs q.4 hours.   Throat culture negative.  Respiratory panel positive for rhino virus.  Continue  begin Flonase and Claritin.  Add Mucinex and Robitussin p.r.n.   All other comorbidities stable at this time.  Trend daily CBC, BMP, magnesium in INR per above.   Hemoglobin stable   Creatinine 1.4.  This is at his baseline.   Echo with low normal systolic function mild mitral regurg mild tricuspid regurg.    Subjective / Interval history:  This is 62 y.o.  White male who presents with PMH significant for CAD/CABG, HTN, HLD, CKD III, PE/DVT on warfarin s/p IVC filter removal 01/2017, chronic anemia who presents for chest pain.  Of note, patient's Protonix was recently changed to Zantac by nephrologist and patient was seen by his PCP on 06/21 with complaints of chest and epigastric pain as well as increased belching.  Patient's symptoms continued therefore he came to hospital and was admitted for further  workup and management.  Patient underwent cardio stress test on 05/08/2018 where patient had significant hypotensive episode after stress test and cardiologist wished to take patient for emergent heart catheterization, however patient has been on Coumadin and INR was above 2 therefore patient was brought to floor to await INR to trend down prior to heart catheterization.  Cardiology is following.    Today patient's INR is down to 1.79.  He denies any chest pain or shortness of breath.  He does complain of some sinus congestion as well as a cough.  Telemetry is normal sinus rhythm by my view.  He denies any nausea vomiting abdominal pain.  The patient had a bowel movement yesterday.      PHYSICAL EXAM:  VITALS: BP 101/65   Pulse 61   Temp 36.4 C (97.6 F)   Resp 17   Ht 1.905 m (6\' 3" )   Wt 99.5 kg (219 lb 6.4 oz)   SpO2 97%   BMI 27.42 kg/m     GENERAL:   Pt is a pleasant male who appears stated age and is sitting up in bed in no acute distress.   Constitutional: appears in good health.  Respiratory: Clear to auscultation bilaterally.   Cardiovascular: regular rate and rhythm.  Telemetry normal sinus rhythm  by my view  Gastrointestinal: Soft, non-tender.  Musculoskeletal: AROM WNL of bilateral UE and LE.  No lower extremity edema  Integumentary:  Skin warm and dry.  Psych: A&Ox3      DVT prophylaxis:  Now on heparin drip  Disposition planning:  Home      CBC Results Differential Results   Recent Labs     05/09/18  0425 05/10/18  0511   WBC 5.1 5.9   HGB 12.9* 12.5*   HCT 40.0* 38.7*   PLTCNT 193 194      Recent Labs     05/09/18  0425 05/10/18  0511   LYMPHOCYTES 34 24*   MONOCYTES 13* 11*   EOSINOPHIL 6* 6*   PMNABS 2.40 3.50   LYMPHSABS 1.70 1.40   MONOSABS 0.60 0.60   EOSABS 0.30 0.40          BMP Results Other Chemistries Results   Recent Labs     05/09/18  0425 05/10/18  0511   SODIUM 142 143   POTASSIUM 4.4 4.5   CHLORIDE 109 109   CO2 26 25   BUN 23* 21   CREATININE 1.40 1.40   GFR 51* 51*      GLUCOSENF 92 92        Recent Labs     05/07/18  1303  05/08/18  0314 05/09/18  0425 05/10/18  0511   CALCIUM 10.0  --  9.9 9.3 9.6   ALBUMIN 4.8  --  4.3  --   --    MAGNESIUM  --    < > 2.0 2.0 2.0    < > = values in this interval not displayed.        Liver/Pancreas Enzyme Results ABG results   Recent Labs     05/08/18  0314   TOTBILIRUBIN 0.5   AST 27   ALT 26   ALKPHOS 39   TOTALPROTEIN 7.0   ALBUMIN 4.3        No results found for this encounter   Cardiac Results    Coag Results   Recent Labs     05/07/18  1228 05/07/18  1511 05/07/18  1914   UHCEASTTROPI <0.01 <0.01 <0.01      Recent Labs     05/07/18  1303 05/08/18  0313 05/09/18  0425 05/10/18  0511 05/10/18  0628   PROTHROMTME 25.7* 29.7* 28.7* 20.8*  --    INR 2.22 2.56 2.47 1.79  --    APTT 44.8*  --   --   --  33.4          Lipid Panel Toxicology   No results for input(s): CHOLESTEROL, TRIG, HDLCHOL, LDLCHOL in the last 72 hours. No results for input(s): ETHANOL, UAMPTN, UBARBN, [place], [place], [place], [place], [place], [place], PROPOXYPHEUR in the last 72 hours.         UA Cultures   No results for input(s): CHARACTER, COLOR, SPECGRAVUR, PHURINE, GLUCOSE, KETONES, UROBILINOGEN, LEUKOCYTES, NITRITE, EPITHCEL, BACTERIA, MUCOUS, AMORPCRY, AMORPHSED, CASTS, BLOOD in the last 72 hours.    Invalid input(s): [facility] Encounter on 05/07/18 (from the past 96 hour(s))   RESPIRATORY VIRUS PANEL RAPID WITH REFLEX TO EXTENDED BIOFIRE PCR PANEL    Collection Time: 05/09/18  1:20 PM   Culture Result Status    INFLUENZA A ANTIGEN Not Detected Final    INFLUENZA B ANTIGEN Not Detected Final    RSV ANTIGEN Not Performed Final   RAPID THROAT  SCREEN, STREPTOCOCCUS, WITH REFLEX    Collection Time: 05/09/18  1:20 PM   Culture Result Status    THROAT RAPID SCREEN, STREPTOCOCCUS Not Detected Final   RESPIRATORY VIRUS PANEL BY BIOFIRE FILM ARRAY    Collection Time: 05/09/18  1:20 PM   Culture Result Status    ADENOVIRUS ARRAY Not Detected Final    CORONAVIRUS 229E Not  Detected Final    CORONAVIRUS HKU1 Not Detected Final    CORONAVIRUS NL63 Not Detected Final    CORONAVIRUS OC43 Not Detected Final    METAPNEUMOVIRUS ARRAY Not Detected Final    RHINOVIRUS/ENTEROVIRUS ARRAY Detected (A) Final    INFLUENZA A Not Detected Final    INFLUENZA B ARRAY Not Detected Final    PARAINFLUENZA 1 ARRAY Not Detected Final    PARAINFLUENZA 2 ARRAY Not Detected Final    PARAINFLUENZA 3 ARRAY Not Detected Final    PARAINFLUENZA 4 ARRAY Not Detected Final    RSV ARRAY Not Detected Final    BORDETELLA PERTUSSIS ARRAY Not Detected Final    CHLAMYDOPHILA PNEUMONIAE ARRAY Not Detected Final    MYCOPLASMA PNEUMONIAE ARRAY Not Detected Final          Other Micro pathology       No results found for this visit on 05/07/18.           Pending Labs and Procedures     Order Current Status    TRANSTHORACIC ECHOCARDIOGRAM - ADULT In process                 Imaging:  Results for orders placed or performed during the hospital encounter of 05/07/18 (from the past 72 hour(s))   XR AP MOBILE CHEST     Status: None    Narrative    Male, 62 years old.    XR AP MOBILE CHEST performed on 05/07/2018 1:26 PM.    REASON FOR EXAM:  CHEST PAIN    TECHNIQUE: 1 views/1 images submitted for interpretation.    COMPARISON:  09/19/2016    FINDINGS:  The patient is status post sternotomy. The heart is top normal  as to size. The pulmonary vascularity is within normal limits.    The lungs are expanded and no focal consolidative process is seen in the  lungs. No pleural effusion. No pneumothorax.      Impression    1. No acute cardiopulmonary process is seen.        Radiologist location ID: [id number]           Inpatient Medications:    Current Facility-Administered Medications:  acetaminophen (TYLENOL) tablet 500 mg Oral 3x/day   allopurinol (ZYLOPRIM) tablet 100 mg Oral Daily   aspirin (ECOTRIN) enteric coated tablet 81 mg 81 mg Oral Daily   calcium carbonate 1500mg  (600mg  of elemental calcium) tablet 600 mg Oral Daily   citalopram  (CELEXA) tablet 10 mg Oral Daily   famotidine (PEPCID) tablet 20 mg Oral 2x/day   fenofibrate micronized (LOFIBRA) capsule 134 mg Oral Daily   ferrous sulfate 325 mg (elemental IRON 65 mg) tablet 325 mg Oral 2x/day   fluticasone (FLONASE) 50 mcg per spray nasal spray 1 Spray Each Nostril 2x/day   heparin 25,000 units in D5W 250 mL infusion 11.05 Units/kg/hr (Adjusted) Intravenous Continuous   ipratropium-albuterol 0.5 mg-3 mg(2.5 mg base)/3 mL Solution for Nebulization 3 mL Nebulization Q4H PRN   loratadine (CLARITIN) tablet 10 mg Oral Daily   magnesium hydroxide (MILK OF MAGNESIA) 400mg  per 64mL oral liquid 15 mL  Oral Daily PRN   multivitamin-iron-folic acid-calcium-minerals (THERA-M PLUS) tablet 1 Tab Oral Daily   nitroGLYCERIN (NITROSTAT) sublingual tablet 0.4 mg Sublingual Q5 Min PRN   NS flush syringe 3 mL Intracatheter Q8HRS   NS flush syringe 3 mL Intracatheter Q1H PRN   NS premix infusion  Intravenous Continuous   ondansetron (ZOFRAN) 2 mg/mL injection 4 mg Intravenous Q6H PRN   phenol (CHLORASEPTIC) 1.4% oromucosal spray 1 Spray Mouth/Throat Q4H PRN   tamsulosin (FLOMAX) capsule 0.4 mg Oral Daily after Dinner      ______________________________________________________________________    Petra Kuba, MD

## 2018-05-10 NOTE — Nurses Notes (Signed)
MISSA CULVER P.A NOTIFIED OF RESPIRATORY VIRUS PANEL RESULTS.  NO ORDER OBTAINED.

## 2018-05-10 NOTE — Nurses Notes (Signed)
ASSESSMENT COMPLETED VIA FLOWSHEET.  DENIES COMPLAINTS OR WANTS.  NO S/S OF DISTRESS NOTED.  PLEASANT.  ALERT AND ORIENTED X 4.  CALL LIGHT IN REACH.  MOVES ALL EXTREMITIES.

## 2018-05-10 NOTE — Care Plan (Signed)
Problem: Adult Inpatient Plan of Care  Goal: Plan of Care Review  Outcome: Ongoing (see interventions/notes)  Goal: Patient-Specific Goal (Individualization)  Outcome: Ongoing (see interventions/notes)  Goal: Absence of Hospital-Acquired Illness or Injury  Outcome: Ongoing (see interventions/notes)  Goal: Optimal Comfort and Wellbeing  Outcome: Ongoing (see interventions/notes)  Goal: Rounds/Family Conference  Outcome: Ongoing (see interventions/notes)     Problem: Adjustment to Illness (Acute Coronary Syndrome)  Goal: Optimal Adaptation to Illness  Outcome: Ongoing (see interventions/notes)     Problem: Arrhythmia (Acute Coronary Syndrome)  Goal: Normalized Cardiac Rhythm  Outcome: Ongoing (see interventions/notes)     Problem: Pain (Acute Coronary Syndrome)  Goal: Absence of Cardiac-Related Pain  Outcome: Ongoing (see interventions/notes)     Problem: Tissue Perfusion (Acute Coronary Syndrome)  Goal: Adequate Tissue Perfusion  Outcome: Ongoing (see interventions/notes)     Problem: Pain Acute  Goal: Optimal Pain Control  Outcome: Ongoing (see interventions/notes)   LYING IN BED.  DENIES COMPLAINTS OR WANTS.  NO S/S OF DISTRESS NOTED.  CALL LIGHT IN REACH.  SIDERAILS UP X 2.  PT ALERT AND ORIENTED X 4.

## 2018-05-10 NOTE — Nurses Notes (Signed)
ASSESSMENT COMPLETED VIA FLOWSHEET.  DENIES COMPLAINTS OR WANTS.  NO S/S OF DISTRESS NOTED.  PLEASANT.  ALERT AND ORIENTED X 4.  CALL LIGHT IN REACH.  MOVES ALL EXTREMITIES.  PT WAITING TO FIND OUT IF HAVING CARDIAC CATH TODAY.

## 2018-05-10 NOTE — Care Plan (Signed)
At this time pt is lying in bed resting quietly with eyes closed, respirations easy on room air. No s/sx of pain or distress noted, will continue to monitor. For further details please see flow sheets.       Problem: Adult Inpatient Plan of Care  Goal: Plan of Care Review  Outcome: Ongoing (see interventions/notes)  Goal: Patient-Specific Goal (Individualization)  Outcome: Ongoing (see interventions/notes)  Goal: Absence of Hospital-Acquired Illness or Injury  Outcome: Ongoing (see interventions/notes)  Goal: Optimal Comfort and Wellbeing  Outcome: Ongoing (see interventions/notes)  Goal: Rounds/Family Conference  Outcome: Ongoing (see interventions/notes)     Problem: Adjustment to Illness (Acute Coronary Syndrome)  Goal: Optimal Adaptation to Illness  Outcome: Ongoing (see interventions/notes)     Problem: Arrhythmia (Acute Coronary Syndrome)  Goal: Normalized Cardiac Rhythm  Outcome: Ongoing (see interventions/notes)     Problem: Pain (Acute Coronary Syndrome)  Goal: Absence of Cardiac-Related Pain  Outcome: Ongoing (see interventions/notes)     Problem: Tissue Perfusion (Acute Coronary Syndrome)  Goal: Adequate Tissue Perfusion  Outcome: Ongoing (see interventions/notes)     Problem: Pain Acute  Goal: Optimal Pain Control  Outcome: Ongoing (see interventions/notes)

## 2018-05-10 NOTE — Nurses Notes (Signed)
Notified Dr. Cornelia Copa regarding this morning's PT/INR results. New order received.

## 2018-05-11 ENCOUNTER — Encounter (HOSPITAL_COMMUNITY)
Admission: EM | Disposition: A | Discharge status: Home / Self Care | Payer: Self-pay | Source: Home / Self Care | Attending: Internal Medicine

## 2018-05-11 ENCOUNTER — Telehealth (INDEPENDENT_AMBULATORY_CARE_PROVIDER_SITE_OTHER): Payer: Self-pay | Admitting: Family Medicine

## 2018-05-11 DIAGNOSIS — I959 Hypotension, unspecified: Secondary | ICD-10-CM

## 2018-05-11 DIAGNOSIS — Z86718 Personal history of other venous thrombosis and embolism: Secondary | ICD-10-CM

## 2018-05-11 DIAGNOSIS — J069 Acute upper respiratory infection, unspecified: Secondary | ICD-10-CM

## 2018-05-11 DIAGNOSIS — B348 Other viral infections of unspecified site: Secondary | ICD-10-CM

## 2018-05-11 LAB — CBC
HCT: 38.3 % — ABNORMAL LOW (ref 42.0–52.0)
HGB: 12.4 g/dL — ABNORMAL LOW (ref 14.0–18.0)
MCH: 28.7 pg (ref 27.0–31.0)
MCHC: 32.3 g/dL — ABNORMAL LOW (ref 33.0–37.0)
MCV: 88.9 fL (ref 80.0–94.0)
PLATELETS: 182 x10ˆ3/uL (ref 130–400)
RBC: 4.31 10*6/uL — ABNORMAL LOW (ref 4.70–6.10)
RDW: 15.5 % — ABNORMAL HIGH (ref 11.5–14.5)
WBC: 4.2 x10ˆ3/uL — ABNORMAL LOW (ref 4.8–10.8)

## 2018-05-11 LAB — BASIC METABOLIC PANEL, FASTING
ANION GAP: 9 mmol/L
BUN/CREA RATIO: 12
BUN: 18 mg/dL (ref 9–21)
CALCIUM: 9.6 mg/dL (ref 8.5–10.3)
CHLORIDE: 108 mmol/L (ref 101–111)
CO2 TOTAL: 26 mmol/L (ref 22–31)
CREATININE: 1.5 mg/dL — ABNORMAL HIGH (ref 0.70–1.40)
ESTIMATED GFR: 47 mL/min/1.73mˆ2 — ABNORMAL LOW (ref >=60)
GLUCOSE: 89 mg/dL (ref 68–99)
POTASSIUM: 4.2 mmol/L (ref 3.6–5.0)
SODIUM: 143 mmol/L (ref 137–145)

## 2018-05-11 LAB — MAGNESIUM: MAGNESIUM: 2 mg/dL (ref 1.7–2.2)

## 2018-05-11 LAB — PT/INR
INR: 1.41 (ref <=4.00)
PROTHROMBIN TIME: 16.3 s — ABNORMAL HIGH (ref 9.7–13.6)

## 2018-05-11 LAB — INVASIVE CARDIOLOGY PROCEDURE: Cath EF Estimated: 60 %

## 2018-05-11 LAB — PTT (PARTIAL THROMBOPLASTIN TIME): APTT: 46.9 s — ABNORMAL HIGH (ref 26.0–39.0)

## 2018-05-11 LAB — THROAT CULTURE, BETA HEMOLYTIC STREPTOCOCCUS

## 2018-05-11 SURGERY — CORS & LHC W/BYPASS GRAFTS W/WO LVG

## 2018-05-11 MED ORDER — SODIUM CHLORIDE 0.9 % (FLUSH) INJECTION SYRINGE
3.0000 mL | INJECTION | INTRAMUSCULAR | Status: DC | PRN
Start: 2018-05-11 — End: 2018-05-11

## 2018-05-11 MED ORDER — LIDOCAINE HCL 20 MG/ML (2 %) INJECTION SOLUTION
INTRAMUSCULAR | Status: AC
Start: 2018-05-11 — End: 2018-05-11
  Filled 2018-05-11: qty 20

## 2018-05-11 MED ORDER — PHENOL 1.4 % MUCOSAL AEROSOL SPRAY
1.00 | INHALATION_SPRAY | Status: DC | PRN
Start: 2018-05-11 — End: 2018-05-23

## 2018-05-11 MED ORDER — FLUTICASONE PROPIONATE 50 MCG/ACTUATION NASAL SPRAY,SUSPENSION
1.00 | Freq: Two times a day (BID) | NASAL | 0 refills | Status: DC
Start: 2018-05-11 — End: 2018-05-23

## 2018-05-11 MED ORDER — MIDAZOLAM 1 MG/ML INJECTION SOLUTION
INTRAMUSCULAR | Status: AC
Start: 2018-05-11 — End: 2018-05-11
  Filled 2018-05-11: qty 2

## 2018-05-11 MED ORDER — IOPAMIDOL 370 MG IODINE/ML (76 %) INTRAVENOUS SOLUTION
Freq: Once | INTRAVENOUS | Status: DC | PRN
Start: 2018-05-11 — End: 2018-05-11
  Administered 2018-05-11: 08:00:00 147 mL via INTRA_ARTERIAL

## 2018-05-11 MED ORDER — SODIUM CHLORIDE 0.9 % (FLUSH) INJECTION SYRINGE
3.0000 mL | INJECTION | Freq: Three times a day (TID) | INTRAMUSCULAR | Status: DC
Start: 2018-05-11 — End: 2018-05-11

## 2018-05-11 MED ORDER — HEPARIN (PORCINE) (PF) 2,000 UNIT/1,000 ML IN 0.9 % SODIUM CHLORIDE IV
Freq: Once | INTRAVENOUS | Status: DC | PRN
Start: 2018-05-11 — End: 2018-05-11
  Administered 2018-05-11: 2000 [IU]

## 2018-05-11 MED ORDER — ENOXAPARIN 100 MG/ML SUBCUTANEOUS SYRINGE
1.00 mg/kg | INJECTION | Freq: Two times a day (BID) | SUBCUTANEOUS | 0 refills | Status: AC
Start: 2018-05-11 — End: 2018-05-18

## 2018-05-11 MED ORDER — LIDOCAINE HCL 20 MG/ML (2 %) INJECTION SOLUTION
Freq: Once | INTRAMUSCULAR | Status: DC | PRN
Start: 2018-05-11 — End: 2018-05-11
  Administered 2018-05-11: 20 mL via INTRADERMAL

## 2018-05-11 MED ORDER — ENOXAPARIN 100 MG/ML SUBCUTANEOUS SYRINGE
1.00 mg/kg | INJECTION | Freq: Two times a day (BID) | SUBCUTANEOUS | Status: DC
Start: 2018-05-11 — End: 2018-05-11
  Administered 2018-05-11: 100 mg via SUBCUTANEOUS
  Filled 2018-05-11: qty 1

## 2018-05-11 MED ORDER — GUAIFENESIN ER 600 MG TABLET, EXTENDED RELEASE 12 HR
600.00 mg | EXTENDED_RELEASE_TABLET | Freq: Two times a day (BID) | ORAL | Status: DC
Start: 2018-05-11 — End: 2018-05-23

## 2018-05-11 MED ORDER — MIDAZOLAM 1 MG/ML INJECTION SOLUTION
Freq: Once | INTRAMUSCULAR | Status: DC | PRN
Start: 2018-05-11 — End: 2018-05-11
  Administered 2018-05-11 (×2): 1 mg via INTRAVENOUS

## 2018-05-11 MED ORDER — DEXTROMETHORPHAN-GUAIFENESIN 10 MG-100 MG/5 ML ORAL SYRUP
10.00 mL | ORAL_SOLUTION | ORAL | Status: DC | PRN
Start: 2018-05-11 — End: 2018-05-23

## 2018-05-11 MED ORDER — HEPARIN (PORCINE) IN NS (PF) 2 UNIT/ML IV
INTRAVENOUS | Status: AC | PRN
Start: 2018-05-11 — End: 2018-05-11
  Administered 2018-05-11: 3 mL/h via INTRA_ARTERIAL

## 2018-05-11 MED ORDER — HEPARIN (PORCINE) (PF) 2,000 UNIT/1,000 ML IN 0.9 % SODIUM CHLORIDE IV
INTRAVENOUS | Status: AC
Start: 2018-05-11 — End: 2018-05-11
  Filled 2018-05-11: qty 2000

## 2018-05-11 MED ADMIN — sodium chloride 0.9 % intravenous solution: INTRAVENOUS | @ 08:00:00 | NDC 00338004904

## 2018-05-11 SURGICAL SUPPLY — 28 items
CATH ANGIO 6FR 145D PGTL CURVE 110CM EXPO FULL LGTH WRE BRD RBST SHAFT PERI CARDIAC TRILON (VASCULAR) ×1 IMPLANT
CATH ANGIO 6FR 145D PGTL CURVE_110CM EXPO FULL LGTH WRE BRD (VASCULAR) ×1
CATH ANGIO 6FR AL1 CURVE 100CM EXPO WRE BRD RBST SHAFT PERI TRILON (DIAGNOSTIC) ×1 IMPLANT
CATH ANGIO 6FR AL1 CURVE 100CM_EXPO WRE BRD RBST SHFT PERI (DIAGNOSTIC) ×1
CATH ANGIO 6FR FL4 CURVE 100CM EXPO WRE BRD RBST SHAFT FEM TRILON (DIAGNOSTIC) ×1 IMPLANT
CATH ANGIO 6FR FL4 CURVE 100CM_EXPO WRE BRD RBST SHFT FEM (DIAGNOSTIC) ×1
CATH ANGIO 6FR FR4 CURVE 100CM EXPO FULL LGTH WRE BRD RBST SHAFT VENTRIC TRILON (DIAGNOSTIC) ×1 IMPLANT
CATH ANGIO 6FR FR4 CURVE 100CM_EXPO FULL LGTH WRE BRD RBST (DIAGNOSTIC) ×1
CATH ANGIO 6FR MPA1 CURVE 100CM EXPO WRE BRD RBST SHAFT PERI TRILON (DIAGNOSTIC) ×1 IMPLANT
CATH ANGIO 6FR MPA1 CURVE 100C_M EXPO WRE BRD RBST SHFT PERI (DIAGNOSTIC) ×1
CONV USE ITEM 338050 - PACK SURG CSTM CTH LB LF (CUSTOM TRAYS & PACK) ×1
DEVICE CLSR ANGIOSEAL VIP 6FR 70CM HMST BIOABS INSERTION SHEATH GW CO-PLMR CLGN VAS STRL LF  DISP ×1 IMPLANT
GW INQWR .035IN 150CM FIX COR FINGER STRG PTFE VAS 3MM RAD STD J STRL LF (WIRE) ×1 IMPLANT
GW INQWR .035IN 150CM FIX COR_NGR STRG STD SHFT PTFE VAS 3 (WIRE) ×1
KIT INTVN LFT HRT (GUIDING) ×1
KIT SNGL FLUSH LINE_K0801987 15EA/BX (GUIDING) ×2 IMPLANT
KIT SURG LFT HRT STRL DISP LF (GUIDING) ×1 IMPLANT
KIT SYRG MDRD FSTN FIL TUBE 15_0ML MRK5 PRVS STRL LF DISP (NEEDLES & SYRINGE SUPPLIES) ×1
KIT SYRG MEDRAD FASTURN FIL TUBE 150ML MRK5 PRVS STRL LF  DISP (NEEDLES & SYRINGE SUPPLIES) ×1 IMPLANT
NEEDLE 7CM 18GA WO BASEPLATE P_ROC PERC DISP TW VAS ACCESS (NEEDLES & SYRINGE SUPPLIES) ×2 IMPLANT
PACK HEART CATH_DYNJ38683A 6EA/CS (CUSTOM TRAYS & PACK) ×1
PACK SURG CSTM CTH LB LF (CUSTOM TRAYS & PACK) ×1 IMPLANT
SHEATH INTROD 10CM 6FR PINN COR SS HDRPH PTFE SNAPON DIL LOCK KINK RST SMOOTH TRNS 2.5CM ACPT .038IN (GUIDING) ×1 IMPLANT
SHEATH SUPER 6FR 11CM .035_RSS602 10EA/BX (GUIDING) ×1
SHIELD RAD 17X12IN X-DRP .25MM ABS XRY CTOUT LEADFREE OVRWRAP 35X35IN STRL DISP (PROTECTIVE PRODUCTS/GARMENTS) ×1 IMPLANT
SHIELD RAD 17X12IN X-DRP .25MM_ABS XRY CTOUT LEADFREE (PROTECTIVE PRODUCTS/GARMENTS) ×1
STOPCOCK ANGIO 400 PSI NAMIC 3W ROT ADPR LFT PORT STRL LF (WIRE) ×1 IMPLANT
STOPCOCK ANGIO 400 PSI NAMIC 3_W ROT ADPR LFT PORT STRL (WIRE) ×1

## 2018-05-11 NOTE — Care Management Notes (Signed)
Per [facility], lovenox is covered with zero co-pay.

## 2018-05-11 NOTE — Discharge Summary (Signed)
[facility] SUMMARY    Name: Dustin Webster    DOB: 05/02/1956 MRN/CSN: [id number]   Admission date: 05/07/2018 Discharge date:    PCP: [name], MD Code Status: Full Code       Discharge Diagnoses:    1. Chest pain, atypical  2. Hypotensive episode, resolved  3. Upper respiratory symptoms/positive for rhino virus  4. Hx CAD s/p CABG  5. 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 CKD stage III at baseline  7. GERD  8. Chronic anemia, stable  9. Abnormal stress test      Hospital Course:     This is 62 y.o. [place] presents [place] significant for CAD/CABG, HTN, HLD, CKD III, PE/DVT on warfarin s/p IVC filter removal 01/2017, chronic anemia who presents for chest pain.  Of note, patient's Protonix was recently changed to Zantac by nephrologist and patient was seen by his PCP on 06/21 with complaints of chest and epigastric pain as well as increased belching.  Patient's symptoms continued therefore he came to hospital and was admitted for further workup and management.  Patient underwent cardio stress test on 05/08/2018 where patient had significant hypotensive episode after stress test and cardiologist wished to take patient for emergent heart catheterization, however patient has been on Coumadin and INR was above 2 therefore patient was brought to floor to await INR to trend down prior to heart catheterization.  Left heart catheterization is as follows:   Normal left ventricular systolic function   Three-vessel coronary artery disease as below   Widely patent bypass graft x4 without significant disease and with normal flow   Successful Angio-Seal of the right common femoral artery       After the catheterization Dr. Cornelia Copa said the patient could be discharged home on bridging Lovenox if the patient was agreeable.  The patient wanted to go home and was agreeable to bridging Lovenox.  He was advised to restart his Coumadin at his home dose and then get his PT INR on Monday July 1st  then daily until his INR is greater than 2.0 then as directed by Dr. Cornelia Copa or his primary care physician.  Results of PT INR 2 Dr. Cornelia Copa.  He was going to be on Lovenox subcu 1 milligram/kilogram b.i.d. Until his INR was greater than 2.0 then he is to stop his Lovenox.  During this admission the patient's blood pressure was low and his amlodipine was stopped during this admission.  He did have some URI symptoms and a respiratory panel came back positive for rhino virus.  He was started on some Flonase Claritin Mucinex and Robitussin p.r.n..  His creatinine remained at baseline.  Also of note an echocardiogram was performed with low normal systolic function mild mitral regurgitation and mild tricuspid regurgitation.  Dr. Cornelia Copa felt the patient could be discharged home from his standpoint.  Condition at discharge stable.  Time spent on discharge 40 min.    GENERAL:   Pt is a pleasant male who appears stated age and is sitting up in bed in no acute distress.   Constitutional: appears in good health.  Respiratory: Clear to auscultation bilaterally.   Cardiovascular: regular rate and rhythm.  Telemetry normal sinus rhythm by my view  Gastrointestinal: Soft, non-tender.  Musculoskeletal: AROM.  No lower extremity edema  Integumentary: Skin warm and dry.  Psych: A&Ox3          CBC Results Differential Results   Recent Labs     05/10/18  3299 05/11/18  0547   WBC 5.9 4.2*   HGB 12.5* 12.4*   HCT 38.7* 38.3*   PLTCNT 194 182      Recent Labs     05/09/18  0425 05/10/18  0511   LYMPHOCYTES 34 24*   MONOCYTES 13* 11*   EOSINOPHIL 6* 6*   PMNABS 2.40 3.50   LYMPHSABS 1.70 1.40   MONOSABS 0.60 0.60   EOSABS 0.30 0.40          BMP Results Other Chemistries Results   Recent Labs     05/10/18  0511 05/11/18  0547   SODIUM 143 143   POTASSIUM 4.5 4.2   CHLORIDE 109 108   CO2 25 26   BUN 21 18   CREATININE 1.40 1.50*   GFR 51* 47*   GLUCOSENF 92 89        Recent Labs     05/10/18  0511 05/11/18  0547   CALCIUM 9.6 9.6   MAGNESIUM  2.0 2.0        Liver/Pancreas Enzyme Results ABG results   No results for input(s): TOTBILIRUBIN, AST, ALT, ALKPHOS, GAMMAGT, TOTALPROTEIN, ALBUMIN, PREALBUMIN, LDH, AMYLASE, LIPASE, AMMONIA in the last 72 hours.     No results found for this encounter   Cardiac Results    Coag Results   No results for input(s): UHCEASTTROPI, CKMB, MBINDEX, BNP in the last 72 hours.   Recent Labs     05/10/18  0628 05/10/18  1116 05/10/18  1413 05/10/18  2340 05/11/18  0547   PROTHROMTME  --  19.3* 19.1*  --  16.3*   INR  --  1.66 1.65  --  1.41   APTT 33.4 106.2*  --  46.9*  --           Lipid Panel Toxicology   No results for input(s): CHOLESTEROL, TRIG, HDLCHOL, LDLCHOL in the last 72 hours. No results for input(s): ETHANOL, UAMPTN, UBARBN, [place], [place], [place], [place], [place], [place], PROPOXYPHEUR in the last 72 hours.         UA Cultures   No results for input(s): CHARACTER, COLOR, SPECGRAVUR, PHURINE, GLUCOSE, KETONES, UROBILINOGEN, LEUKOCYTES, NITRITE, EPITHCEL, BACTERIA, MUCOUS, AMORPCRY, AMORPHSED, CASTS, BLOOD in the last 72 hours.    Invalid input(s): [facility] Encounter on 05/07/18 (from the past 96 hour(s))   RESPIRATORY VIRUS PANEL RAPID WITH REFLEX TO EXTENDED BIOFIRE PCR PANEL    Collection Time: 05/09/18  1:20 PM   Culture Result Status    INFLUENZA A ANTIGEN Not Detected Final    INFLUENZA B ANTIGEN Not Detected Final    RSV ANTIGEN Not Performed Final   RAPID THROAT SCREEN, STREPTOCOCCUS, WITH REFLEX    Collection Time: 05/09/18  1:20 PM   Culture Result Status    THROAT RAPID SCREEN, STREPTOCOCCUS Not Detected Final   RESPIRATORY VIRUS PANEL BY BIOFIRE FILM ARRAY    Collection Time: 05/09/18  1:20 PM   Culture Result Status    ADENOVIRUS ARRAY Not Detected Final    CORONAVIRUS 229E Not Detected Final    CORONAVIRUS HKU1 Not Detected Final    CORONAVIRUS NL63 Not Detected Final    CORONAVIRUS OC43 Not Detected Final    METAPNEUMOVIRUS ARRAY Not Detected Final    RHINOVIRUS/ENTEROVIRUS ARRAY Detected (A)  Final    INFLUENZA A Not Detected Final    INFLUENZA B ARRAY Not Detected Final    PARAINFLUENZA 1 ARRAY Not Detected Final    PARAINFLUENZA 2  ARRAY Not Detected Final    PARAINFLUENZA 3 ARRAY Not Detected Final    PARAINFLUENZA 4 ARRAY Not Detected Final    RSV ARRAY Not Detected Final    BORDETELLA PERTUSSIS ARRAY Not Detected Final    CHLAMYDOPHILA PNEUMONIAE ARRAY Not Detected Final    MYCOPLASMA PNEUMONIAE ARRAY Not Detected Final   THROAT CULTURE, BETA HEMOLYTIC STREPTOCOCCUS    Collection Time: 05/09/18  1:20 PM   Culture Result Status    THROAT CULTURE No Streptococcus pyogenens (Group A) Isolated Final          Other Micro pathology       No results found for this visit on 05/07/18.               Results for orders placed during the hospital encounter of 05/07/18   TRANSTHORACIC ECHOCARDIOGRAM - ADULT 05/09/2018 10:33 AM             Imaging:  Xr Ap Mobile Chest    Result Date: 05/07/2018  Male, 62 years old. XR AP MOBILE CHEST performed on 05/07/2018 1:26 PM. REASON FOR EXAM:  CHEST PAIN TECHNIQUE: 1 views/1 images submitted for interpretation. COMPARISON:  09/19/2016 FINDINGS:  The patient is status post sternotomy. The heart is top normal as to size. The pulmonary vascularity is within normal limits. The lungs are expanded and no focal consolidative process is seen in the lungs. No pleural effusion. No pneumothorax.     1. No acute cardiopulmonary process is seen. Radiologist location ID: [id number]         Discharge Medications:     Current Discharge Medication List      START taking these medications.      Details   dextromethorphan-guaiFENesin 10-100 mg/5 mL Syrup  Commonly known as:  ROBITUSSIN DM   10 mL, Oral, EVERY 4 HOURS PRN  Refills:  0     enoxaparin 100 mg/mL Syringe  Commonly known as:  LOVENOX   1 mg/kg, Subcutaneous, EVERY 12 HOURS  Qty:  14 mL  Refills:  0     fluticasone propionate 50 mcg/actuation Spray, Suspension  Commonly known as:  FLONASE   1 Spray, Each Nostril, 2 TIMES DAILY  Qty:  1  Inhaler  Refills:  0     guaiFENesin 600 mg Tablet Extended Release 12hr  Commonly known as:  MUCINEX   600 mg, Oral, 2 TIMES DAILY  Refills:  0     phenol 1.4% 1.4 % Aerosol, Spray  Commonly known as:  CHLORASEPTIC   1 Spray, Mouth/Throat, EVERY 4 HOURS PRN  Refills:  0        CONTINUE these medications - NO CHANGES were made during your visit.      Details   allopurinol 100 mg Tablet  Commonly known as:  ZYLOPRIM   DAILY  Refills:  0     aspirin 81 mg Tablet, Delayed Release (E.C.)  Commonly known as:  ECOTRIN   DAILY  Refills:  0     Calcium Carbonate 650 mg calcium (1,625 mg) Tablet  Commonly known as:  OS-CAL   EVERY EVENING  Refills:  0     citalopram 10 mg Tablet  Commonly known as:  CELEXA   10 mg, Oral, DAILY  Refills:  0     COQ-10 ORAL   DAILY  Refills:  0     fenofibrate nanocrystallized 145 mg Tablet  Commonly known as:  TRICOR   145 mg, Oral, EVERY MORNING WITH BREAKFAST  Qty:  90 Tab  Refills:  3     Iron 325 mg (65 mg iron) Tablet  Generic drug:  ferrous sulfate   2 TIMES DAILY  Refills:  0     melatonin 1 mg Tablet   As directed   Refills:  0     multivitamin 400-250 mcg Tablet, Chewable  Commonly known as:  CENTRUM SILVER   1 Tab, Oral, DAILY  Refills:  0     multivitamin Tablet   DAILY  Refills:  0     nitroGLYCERIN 0.4 mg Tablet, Sublingual  Commonly known as:  NITROSTAT   0.4 mg, Sublingual, EVERY 5 MIN PRN, for 3 doses over 15 minutes  Qty:  25 Tab  Refills:  3     raNITIdine 150 mg Tablet  Commonly known as:  ZANTAC   1 Tab, Oral, 2 TIMES DAILY  Refills:  0     tamsulosin 0.4 mg Capsule  Commonly known as:  FLOMAX   0.4 mg, Oral, EVERY EVENING AFTER DINNER  Qty:  90 Cap  Refills:  3     VITAMIN C 500 mg Tablet  Generic drug:  ascorbic acid (vitamin C)   0.5 Tabs, 2 TIMES DAILY  Refills:  0     * warfarin 10 mg Tablet  Commonly known as:  COUMADIN   10 mg, Oral, EVERY EVENING, Sunday Monday, Wednesday, Friday   Refills:  0     * Warfarin 4 mg Tablet  Commonly known as:  COUMADIN   8 mg, Oral,  EVERY EVENING, Tues, Thursday, and saturday   Refills:  0         * This list has 2 medication(s) that are the same as other medications prescribed for you. Read the directions carefully, and ask your doctor or other care provider to review them with you.            STOP taking these medications.    amLODIPine 10 mg Tablet  Commonly known as:  NORVASC     KRILL OIL ORAL     LUTEIN ORAL     Magnesium 250 mg Tablet            Discharge Instructions:     DISCHARGE INSTRUCTION - CARDIAC DIET     Diet: CARDIAC DIET      DISCHARGE INSTRUCTION - ACTIVITY AS TOLERATED     Activity: AS TOLERATED      DISCHARGE INSTRUCTION - MISC    Restart home dose of Coumadin.  Check PT INR on Monday July 1st and then daily until INR greater than 2.0.  Then as directed by Dr. Cornelia Copa or PCP.  Continue Lovenox 1 milligram/kilogram subcu b.i.d. until INR greater than 2.0, then stop Lovenox        Discussed with the Pt the use of DME after DC, including, but not limited to the following: oxygen, nebulizers, walker, wheelchair & the assistance of home health services if ordered.     Pt is high risk for readmission due to multiple comorbidities.     Follow-up Appointments:  Follow-up Information     Landry Corporal, MD In 2 weeks.    Specialty:  CARDIOLOGY  Why:  np  Contact information:  [address]  [place] [address]  [phone]             [name], MD In 1 week.    Specialty:  FAMILY MEDICINE  Contact information:  [address]  [place] [address]  [phone]                   Referring providers can utilize [email or web address] to access their referred [facility] patient's information.

## 2018-05-11 NOTE — Progress Notes (Signed)
[facility]  HOSPITALIST PROGRESS NOTE      Dustin Webster                     62 y.o. male 319/A    Date of service: 05/11/2018      Date of Admission:  05/07/2018   Code Status: Full Code      Assessments:  1. Chest pain, atypical  2. Hypotensive episode, resolved  3. Upper respiratory symptoms/positive for rhino virus  4. Hx CAD s/p CABG  5. 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 CKD stage III at baseline  7. GERD  8. Chronic anemia, stable  9. Abnormal stress test      Plan:   Patient underwent cardiac stress test on 06/25, however had significant hypotension with SBP into the 60s, came up with a 350 cc normal saline bolus while down in nuclear med.  Labs pending this morning.  He is on heparin drip. Continue close monitoring on telemetry, daily PT INR.  Continue aspirin, Lofibra, nitroglycerin p.r.n. Await left heart catheterization.  Patient's Coumadin will need to be restarted after catheterization.   Patient's home amlodipine being held, while on normal saline fluids 55 cc/hour, blood pressure has been stable and mildly soft, 119/70 this AM.  Continue close monitoring.   Throat culture negative.  Respiratory panel positive for rhino virus.  Continue  Flonase and Claritin,  Mucinex and Robitussin p.r.n.   All other comorbidities stable at this time.  Trend daily CBC, BMP, magnesium in INR per above.   Labs pending this morning   Creatinine 1.4.  yesterday This is at his baseline.  Will follow   Echo with low normal systolic function mild mitral regurg mild tricuspid regurg.    Subjective / Interval history:  This is 62 y.o.  White male who presents with [facility] significant for CAD/CABG, HTN, HLD, CKD III, PE/DVT on warfarin s/p IVC filter removal 01/2017, chronic anemia who presents for chest pain.  Of note, patient's Protonix was recently changed to Zantac by nephrologist and patient was seen by his PCP on 06/21 with complaints of chest and epigastric pain as well as increased belching.   Patient's symptoms continued therefore he came to hospital and was admitted for further workup and management.  Patient underwent cardio stress test on 05/08/2018 where patient had significant hypotensive episode after stress test and cardiologist wished to take patient for emergent heart catheterization, however patient has been on Coumadin and INR was above 2 therefore patient was brought to floor to await INR to trend down prior to heart catheterization.  Cardiology is following.    Awaiting on labs to see if patient can go for left heart catheterization today.  He denies any chest pain or shortness of breath.   Telemetry is normal sinus rhythm by my view.  He denies any nausea vomiting abdominal pain.  The patient had a bowel movement yesterday.      PHYSICAL EXAM:  VITALS: BP 119/70   Pulse 71   Temp 36.7 C (98 F)   Resp 19   Ht 1.905 m (6\' 3" )   Wt 99.5 kg (219 lb 6.4 oz)   SpO2 98%   BMI 27.42 kg/m     GENERAL:   Pt is a pleasant male who appears stated age and is sitting up in bed in no acute distress.   Constitutional: appears in good health.  Respiratory: Clear to auscultation bilaterally.   Cardiovascular: regular rate and  rhythm.  Telemetry normal sinus rhythm by my view  Gastrointestinal: Soft, non-tender.  Musculoskeletal: AROM.  No lower extremity edema  Integumentary:  Skin warm and dry.  Psych: A&Ox3      DVT prophylaxis:  Now on heparin drip  Disposition planning:  Home      CBC Results Differential Results   Recent Labs     05/09/18  0425 05/10/18  0511   WBC 5.1 5.9   HGB 12.9* 12.5*   HCT 40.0* 38.7*   PLTCNT 193 194      Recent Labs     05/09/18  0425 05/10/18  0511   LYMPHOCYTES 34 24*   MONOCYTES 13* 11*   EOSINOPHIL 6* 6*   PMNABS 2.40 3.50   LYMPHSABS 1.70 1.40   MONOSABS 0.60 0.60   EOSABS 0.30 0.40          BMP Results Other Chemistries Results   Recent Labs     05/09/18  0425 05/10/18  0511   SODIUM 142 143   POTASSIUM 4.4 4.5   CHLORIDE 109 109   CO2 26 25   BUN 23* 21      CREATININE 1.40 1.40   GFR 51* 51*   GLUCOSENF 92 92        Recent Labs     05/09/18  0425 05/10/18  0511   CALCIUM 9.3 9.6   MAGNESIUM 2.0 2.0        Liver/Pancreas Enzyme Results ABG results   No results for input(s): TOTBILIRUBIN, AST, ALT, ALKPHOS, GAMMAGT, TOTALPROTEIN, ALBUMIN, PREALBUMIN, LDH, AMYLASE, LIPASE, AMMONIA in the last 72 hours.     No results found for this encounter   Cardiac Results    Coag Results   No results for input(s): UHCEASTTROPI, CKMB, MBINDEX, BNP in the last 72 hours.   Recent Labs     05/10/18  0511 05/10/18  0628 05/10/18  1116 05/10/18  1413 05/10/18  2340   PROTHROMTME 20.8*  --  19.3* 19.1*  --    INR 1.79  --  1.66 1.65  --    APTT  --  33.4 106.2*  --  46.9*          Lipid Panel Toxicology   No results for input(s): CHOLESTEROL, TRIG, HDLCHOL, LDLCHOL in the last 72 hours. No results for input(s): ETHANOL, UAMPTN, UBARBN, [place], [place], [place], [place], [place], [place], PROPOXYPHEUR in the last 72 hours.         UA Cultures   No results for input(s): CHARACTER, COLOR, SPECGRAVUR, PHURINE, GLUCOSE, KETONES, UROBILINOGEN, LEUKOCYTES, NITRITE, EPITHCEL, BACTERIA, MUCOUS, AMORPCRY, AMORPHSED, CASTS, BLOOD in the last 72 hours.    Invalid input(s): [facility] Encounter on 05/07/18 (from the past 96 hour(s))   RESPIRATORY VIRUS PANEL RAPID WITH REFLEX TO EXTENDED BIOFIRE PCR PANEL    Collection Time: 05/09/18  1:20 PM   Culture Result Status    INFLUENZA A ANTIGEN Not Detected Final    INFLUENZA B ANTIGEN Not Detected Final    RSV ANTIGEN Not Performed Final   RAPID THROAT SCREEN, STREPTOCOCCUS, WITH REFLEX    Collection Time: 05/09/18  1:20 PM   Culture Result Status    THROAT RAPID SCREEN, STREPTOCOCCUS Not Detected Final   RESPIRATORY VIRUS PANEL BY BIOFIRE FILM ARRAY    Collection Time: 05/09/18  1:20 PM   Culture Result Status    ADENOVIRUS ARRAY Not Detected Final    CORONAVIRUS 229E Not Detected Final  CORONAVIRUS HKU1 Not Detected Final    CORONAVIRUS NL63 Not  Detected Final    CORONAVIRUS OC43 Not Detected Final    METAPNEUMOVIRUS ARRAY Not Detected Final    RHINOVIRUS/ENTEROVIRUS ARRAY Detected (A) Final    INFLUENZA A Not Detected Final    INFLUENZA B ARRAY Not Detected Final    PARAINFLUENZA 1 ARRAY Not Detected Final    PARAINFLUENZA 2 ARRAY Not Detected Final    PARAINFLUENZA 3 ARRAY Not Detected Final    PARAINFLUENZA 4 ARRAY Not Detected Final    RSV ARRAY Not Detected Final    BORDETELLA PERTUSSIS ARRAY Not Detected Final    CHLAMYDOPHILA PNEUMONIAE ARRAY Not Detected Final    MYCOPLASMA PNEUMONIAE ARRAY Not Detected Final          Other Micro pathology       No results found for this visit on 05/07/18.           Pending Labs and Procedures     Order Current Status    TRANSTHORACIC ECHOCARDIOGRAM - ADULT In process                 Imaging:         Inpatient Medications:    Current Facility-Administered Medications:  1/2 NS premix infusion  Intravenous Continuous   acetaminophen (TYLENOL) tablet 500 mg Oral 3x/day   allopurinol (ZYLOPRIM) tablet 100 mg Oral Daily   aspirin (ECOTRIN) enteric coated tablet 81 mg 81 mg Oral Daily   calcium carbonate 1500mg  (600mg  of elemental calcium) tablet 600 mg Oral Daily   citalopram (CELEXA) tablet 10 mg Oral Daily   dextromethorphan-guaiFENesin (ROBITUSSIN DM) 10-100mg  per 59mL oral liquid 10 mL Oral Q4H PRN   diazePAM (VALIUM) tablet 5 mg Oral See Comment   diphenhydrAMINE (BENADRYL) capsule 25 mg Oral See Comment   famotidine (PEPCID) tablet 20 mg Oral 2x/day   fenofibrate micronized (LOFIBRA) capsule 134 mg Oral Daily   ferrous sulfate 325 mg (elemental IRON 65 mg) tablet 325 mg Oral 2x/day   fluticasone (FLONASE) 50 mcg per spray nasal spray 1 Spray Each Nostril 2x/day   guaiFENesin (MUCINEX) extended release tablet 600 mg Oral 2x/day   heparin 25,000 units in D5W 250 mL infusion 11.05 Units/kg/hr (Adjusted) Intravenous Continuous   ipratropium-albuterol 0.5 mg-3 mg(2.5 mg base)/3 mL Solution for Nebulization 3 mL  Nebulization Q4H PRN   loratadine (CLARITIN) tablet 10 mg Oral Daily   magnesium hydroxide (MILK OF MAGNESIA) 400mg  per 2mL oral liquid 15 mL Oral Daily PRN   multivitamin-iron-folic acid-calcium-minerals (THERA-M PLUS) tablet 1 Tab Oral Daily   nitroGLYCERIN (NITROSTAT) sublingual tablet 0.4 mg Sublingual Q5 Min PRN   NS flush syringe 3 mL Intracatheter Q8HRS   NS flush syringe 3 mL Intracatheter Q1H PRN   NS flush syringe 3 mL Intracatheter Q8HRS   NS flush syringe 3 mL Intracatheter Q1H PRN   NS premix infusion  Intravenous Continuous   ondansetron (ZOFRAN) 2 mg/mL injection 4 mg Intravenous Q6H PRN   phenol (CHLORASEPTIC) 1.4% oromucosal spray 1 Spray Mouth/Throat Q4H PRN   tamsulosin (FLOMAX) capsule 0.4 mg Oral Daily after Dinner      ______________________________________________________________________    Petra Kuba, MD

## 2018-05-11 NOTE — Care Plan (Signed)
Pt has had no c/o chest pain all night.has been npo since midnight for cath in am. Heparincont to infuse w/o difficulty. Did not awaken at this hour  Problem: Adult Inpatient Plan of Care  Goal: Plan of Care Review  Outcome: Ongoing (see interventions/notes)  Goal: Patient-Specific Goal (Individualization)  Outcome: Ongoing (see interventions/notes)  Goal: Absence of Hospital-Acquired Illness or Injury  Outcome: Ongoing (see interventions/notes)  Goal: Optimal Comfort and Wellbeing  Outcome: Ongoing (see interventions/notes)  Goal: Rounds/Family Conference  Outcome: Ongoing (see interventions/notes)     Problem: Adjustment to Illness (Acute Coronary Syndrome)  Goal: Optimal Adaptation to Illness  Outcome: Ongoing (see interventions/notes)     Problem: Arrhythmia (Acute Coronary Syndrome)  Goal: Normalized Cardiac Rhythm  Outcome: Ongoing (see interventions/notes)     Problem: Pain (Acute Coronary Syndrome)  Goal: Absence of Cardiac-Related Pain  Outcome: Ongoing (see interventions/notes)     Problem: Tissue Perfusion (Acute Coronary Syndrome)  Goal: Adequate Tissue Perfusion  Outcome: Ongoing (see interventions/notes)     Problem: Pain Acute  Goal: Optimal Pain Control  Outcome: Ongoing (see interventions/notes)

## 2018-05-11 NOTE — Nurses Notes (Signed)
Patient discharged to home.AVS reviewed with patient/care giver. A written copy of the AVS and discharge instructions was given to the patient/care giver. Questions sufficiently answered as needed. Patient/care giver encouraged to follow up with PCP as indicated. In the event of an emergency, patient/care giver instructed to call 911 or go to the nearest emergency room.

## 2018-05-14 ENCOUNTER — Ambulatory Visit (INDEPENDENT_AMBULATORY_CARE_PROVIDER_SITE_OTHER): Payer: BC Managed Care – PPO

## 2018-05-14 DIAGNOSIS — I2699 Other pulmonary embolism without acute cor pulmonale: Secondary | ICD-10-CM

## 2018-05-15 ENCOUNTER — Ambulatory Visit (INDEPENDENT_AMBULATORY_CARE_PROVIDER_SITE_OTHER): Payer: BC Managed Care – PPO

## 2018-05-15 ENCOUNTER — Ambulatory Visit: Payer: BC Managed Care – PPO

## 2018-05-15 DIAGNOSIS — N401 Enlarged prostate with lower urinary tract symptoms: Principal | ICD-10-CM

## 2018-05-15 DIAGNOSIS — I2699 Other pulmonary embolism without acute cor pulmonale: Secondary | ICD-10-CM

## 2018-05-15 DIAGNOSIS — N138 Other obstructive and reflux uropathy: Secondary | ICD-10-CM

## 2018-05-15 NOTE — Nursing Note (Signed)
Patient of Dr Leandro Reasoner presents today for Instructions and Rx's for Urologix scheduled 05/24/18. 3-5 cm catheter in stock. Voices understanding to all instructions. Orie Fisherman, LPN.

## 2018-05-18 ENCOUNTER — Ambulatory Visit (INDEPENDENT_AMBULATORY_CARE_PROVIDER_SITE_OTHER): Payer: BC Managed Care – PPO

## 2018-05-18 DIAGNOSIS — I2699 Other pulmonary embolism without acute cor pulmonale: Secondary | ICD-10-CM

## 2018-05-23 ENCOUNTER — Encounter (INDEPENDENT_AMBULATORY_CARE_PROVIDER_SITE_OTHER): Payer: Self-pay | Admitting: Family Medicine

## 2018-05-23 ENCOUNTER — Ambulatory Visit (INDEPENDENT_AMBULATORY_CARE_PROVIDER_SITE_OTHER): Payer: BC Managed Care – PPO | Admitting: Family Medicine

## 2018-05-23 ENCOUNTER — Inpatient Hospital Stay (INDEPENDENT_AMBULATORY_CARE_PROVIDER_SITE_OTHER): Payer: Self-pay | Admitting: Family Medicine

## 2018-05-23 VITALS — BP 124/70 | HR 71 | Temp 98.1°F | Ht 75.0 in | Wt 225.0 lb

## 2018-05-23 DIAGNOSIS — F329 Major depressive disorder, single episode, unspecified: Secondary | ICD-10-CM | POA: Insufficient documentation

## 2018-05-23 DIAGNOSIS — Z09 Encounter for follow-up examination after completed treatment for conditions other than malignant neoplasm: Secondary | ICD-10-CM

## 2018-05-23 DIAGNOSIS — G4762 Sleep related leg cramps: Secondary | ICD-10-CM | POA: Insufficient documentation

## 2018-05-23 DIAGNOSIS — R079 Chest pain, unspecified: Secondary | ICD-10-CM

## 2018-05-23 DIAGNOSIS — I10 Essential (primary) hypertension: Secondary | ICD-10-CM

## 2018-05-23 DIAGNOSIS — C4321 Malignant melanoma of right ear and external auricular canal: Secondary | ICD-10-CM

## 2018-05-23 DIAGNOSIS — K644 Residual hemorrhoidal skin tags: Secondary | ICD-10-CM | POA: Insufficient documentation

## 2018-05-23 DIAGNOSIS — E119 Type 2 diabetes mellitus without complications: Secondary | ICD-10-CM

## 2018-05-23 DIAGNOSIS — G4700 Insomnia, unspecified: Secondary | ICD-10-CM

## 2018-05-23 DIAGNOSIS — K219 Gastro-esophageal reflux disease without esophagitis: Secondary | ICD-10-CM

## 2018-05-23 DIAGNOSIS — Z86718 Personal history of other venous thrombosis and embolism: Secondary | ICD-10-CM | POA: Insufficient documentation

## 2018-05-23 DIAGNOSIS — F32A Depression, unspecified: Secondary | ICD-10-CM

## 2018-05-23 DIAGNOSIS — G4701 Insomnia due to medical condition: Secondary | ICD-10-CM | POA: Insufficient documentation

## 2018-05-23 DIAGNOSIS — I25110 Atherosclerotic heart disease of native coronary artery with unstable angina pectoris: Secondary | ICD-10-CM

## 2018-05-23 DIAGNOSIS — E782 Mixed hyperlipidemia: Secondary | ICD-10-CM

## 2018-05-23 DIAGNOSIS — Z87898 Personal history of other specified conditions: Secondary | ICD-10-CM

## 2018-05-23 DIAGNOSIS — N400 Enlarged prostate without lower urinary tract symptoms: Secondary | ICD-10-CM | POA: Insufficient documentation

## 2018-05-23 DIAGNOSIS — I2699 Other pulmonary embolism without acute cor pulmonale: Secondary | ICD-10-CM

## 2018-05-23 DIAGNOSIS — J309 Allergic rhinitis, unspecified: Secondary | ICD-10-CM | POA: Insufficient documentation

## 2018-05-23 DIAGNOSIS — Z9889 Other specified postprocedural states: Secondary | ICD-10-CM | POA: Insufficient documentation

## 2018-05-23 DIAGNOSIS — F321 Major depressive disorder, single episode, moderate: Secondary | ICD-10-CM | POA: Insufficient documentation

## 2018-05-23 HISTORY — DX: Malignant melanoma of right ear and external auricular canal: C43.21

## 2018-05-23 HISTORY — DX: Sleep related leg cramps: G47.62

## 2018-05-23 HISTORY — DX: Depression, unspecified: F32.A

## 2018-05-23 HISTORY — DX: Type 2 diabetes mellitus without complications: E11.9

## 2018-05-23 HISTORY — DX: Insomnia, unspecified: G47.00

## 2018-05-23 HISTORY — DX: Gastro-esophageal reflux disease without esophagitis: K21.9

## 2018-05-24 ENCOUNTER — Ambulatory Visit: Payer: BC Managed Care – PPO

## 2018-05-24 DIAGNOSIS — N138 Other obstructive and reflux uropathy: Secondary | ICD-10-CM

## 2018-05-24 DIAGNOSIS — N401 Enlarged prostate with lower urinary tract symptoms: Secondary | ICD-10-CM

## 2018-05-24 HISTORY — PX: TRANSURETHRAL MICROWAVE THERAPY: SUR1394

## 2018-05-24 NOTE — Nursing Note (Signed)
Patient of Dr Leandro Reasoner presents today for Urologix / TUMT  Patient tolerated procedure with minimal c/o discomfort. Left our office with his wife. No distress noted  Orie Fisherman, LPN.

## 2018-05-24 NOTE — Progress Notes (Signed)
Subjective:     Patient ID:  Dustin Webster is an 62 y.o. male   Chief Complaint:    Chief Complaint   Patient presents with   . Hospital Discharge Transition     Patient recently admitted to [facility] due to chest pain. Patient was taken off amlodipine due to low BP.  Patient denies any additional episodes of chest pain. Patient continues taking Coumadin for 4-way bypass and history of PE. Patient continues following up with Cardiologist Dr. Cornelia Copa.   . Hypertension     Patient admits having low blood pressure mostly in the morning. He states he has orthostatic like lightheadedness/faint feeling. Patient states he gets "tinglingness".   . Benign Prostatic Hypertrophy     Dr. Leandro Reasoner has scheduled prostate procedure tomorrow.    Patient here for hospital follow-up.  See last visit.  States did go to the hospital from office.  Did speak with Cardiology and was told to try nitroglycerin.  Chest pain worsened few days later and was admitted with possible unstable angina.  Had stress testing in the hospital with hypotensive response.  Remained in the hospital while INR normalized then had heart catheterization.  Results noted bypass vessels open and previous disease stable.  Was discharged home.  Known GERD and Protonix stops due to renal disease.  Had been on Zantac but in hospital was placed on Pepcid and states this worked better.  Blood pressure has been stable since coming home.  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  Has BPH.  Some of these hypotensive episodes and orthostatic hypotension felt to be related to Flomax.  Having surgery soon in hopes can stop the Flomax.    HPI  Past Medical History:   Diagnosis Date   . Anemia    . Atrial fibrillation (CMS HCC)    . BPH associated with nocturia    . CAD (coronary artery disease)    . Chronic depression 05/23/2018   . CKD (chronic kidney disease)    . Diet-controlled diabetes mellitus (CMS [place]) 05/23/2018   . DVT (deep venous thrombosis) (CMS HCC)     . Gastroesophageal reflux disease without esophagitis 05/23/2018   . Insomnia 05/23/2018   . 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 Pericardial effusion    . Pulmonary emboli (CMS [facility])      Past Surgical History:   Procedure Laterality Date   . CARD INFERIOR VENA CAVA FILTER REMOVAL N/A 01/17/2017    Performed by [name], MD at [place]   . CORS & LHC W/BYPASS GRAFTS W/WO LVG N/A 05/11/2018    Performed by [name], MD at [place]   . HX CHOLECYSTECTOMY     . HX CORONARY ARTERY BYPASS GRAFT     . HX HEART SURGERY  12/2015   . HX HERNIA REPAIR      Inguinal    . HX LIPOMA RESECTION      Chest   . OTHER SURGICAL HISTORY Left 01/2016    Evacuation of hematoma -Left Atrium  at [facility]    . SKIN CANCER EXCISION  06/21/2013    melanoma/ear/[facility]     Current Outpatient Medications   Medication Sig   . allopurinol (ZYLOPRIM) 100 mg Oral Tablet Once a day   . ascorbic acid, vitamin C, (VITAMIN C) 500 mg Oral Tablet 0.5 Tabs Twice daily   . aspirin (ECOTRIN) 81 mg Oral Tablet, Delayed Release (  E.C.) Once a day   . Calcium Carbonate (OS-CAL) 650 mg calcium (1,625 mg) Oral Tablet Every evening    . ciprofloxacin HCl (CIPRO) 500 mg Oral Tablet TAKE 1 TABLET TWICE A DAY FOR FIVE DAYS START ON 05/23/18   . citalopram (CELEXA) 10 mg Oral Tablet Take 10 mg by mouth Once a day   . diazePAM (VALIUM) 10 mg Oral Tablet TAKE 1 TABLET AN HOUR PRIOR TO PROCEDURE ON 05/24/18 WITH FOOD   . fenofibrate nanocrystallized (TRICOR) 145 mg Oral Tablet Take 1 Tab (145 mg total) by mouth Every morning with breakfast   . HYDROcodone-acetaminophen (NORCO) 5-325 mg Oral Tablet TAKE 1 HOUR PRIOR TO PROCEDURE ON 05/24/18 WITH FOOD   . hyoscyamine sulfate (LEVSIN/SL) 0.125 mg Sublingual Tablet, Sublingual BRING MEDICATION TO THE OFFICE ON DAY OF PROCEDURE   . melatonin 1 mg Oral Tablet As directed   . multivitamin (CENTRUM SILVER) 400-250 mcg Oral Tablet, Chewable Take 1 Tab by mouth Once a day   . nitroGLYCERIN  (NITROSTAT) 0.4 mg Sublingual Tablet, Sublingual 1 Tab (0.4 mg total) by Sublingual route Every 5 minutes as needed for Chest pain for 3 doses over 15 minutes   . raNITIdine (ZANTAC) 150 mg Oral Tablet Take 1 Tab by mouth Twice daily   . tamsulosin (FLOMAX) 0.4 mg Oral Capsule Take 1 Cap (0.4 mg total) by mouth Every evening after dinner   . UBIDECARENONE (COQ-10 ORAL) Once a day   . warfarin (COUMADIN) 10 mg Oral Tablet Take 10 mg by mouth Every evening Sunday Monday, Wednesday, Friday   . Warfarin (COUMADIN) 4 mg Oral Tablet Take 8 mg by mouth Every evening Tues, Thursday, and saturday     Allergies   Allergen Reactions   . Gluten      Celiac disease     Family Medical History:     Problem Relation (Age of Onset)    Emphysema Father    Heart Attack General Family Hx    Heart Disease Mother    Hypertension (High Blood Pressure) Mother    Stroke Mother          Review of Systems   Constitutional: Negative for fatigue and unexpected weight change.   Respiratory: Negative for cough, chest tightness and shortness of breath.    Cardiovascular: Negative for palpitations and leg swelling.   Gastrointestinal: Negative for abdominal pain and blood in stool.   Musculoskeletal: Negative for arthralgias and myalgias.   Neurological: Negative for dizziness and numbness.   Psychiatric/Behavioral: Negative for dysphoric mood.     Objective:   Physical Exam   Constitutional: He is oriented to person, place, and time. No distress.   Neck: Neck supple. No JVD present. No thyromegaly present.   Cardiovascular: Normal rate and regular rhythm.   No murmur heard.  Pulmonary/Chest: Effort normal and breath sounds normal. He has no wheezes. He has no rales.   Abdominal: Soft. Bowel sounds are normal. He exhibits no mass. There is no tenderness.   Musculoskeletal: He exhibits no edema.   Neurological: He is alert and oriented to person, place, and time. Coordination normal.   Psychiatric: He has a normal mood and affect.   Vitals  reviewed.    BP 124/70 (Site: Right, Patient Position: Sitting, Cuff Size: Adult Large)   Pulse 71   Temp 36.7 C (98.1 F) (Tympanic)   Ht 1.905 m (6\' 3" )   Wt 102.1 kg (225 lb)   SpO2 97%   BMI 28.12 kg/m  Assessment & Plan:       ICD-10-CM    1. Hospital discharge follow-up Z09    2. Essential hypertension I10    3. Chest pain, unspecified type R07.9    4. Coronary artery disease involving native coronary artery of native heart with unstable angina pectoris (CMS HCC) I25.110    5. Pulmonary emboli (CMS HCC) I26.99    6. Mixed hyperlipidemia E78.2    7. Benign prostatic hyperplasia, unspecified whether lower urinary tract symptoms present N40.0        Chest pain resolved heart catheterization negative    Known coronary artery disease stable on recent catheterization continue with Cardiology and risk factor modification    Her current DVT and PE continue Coumadin    Hyperlipidemia LDL stable continue with high-dose statin    History of celiac disease doing well with gluten free diet.    GERD will switch to Pepcid and a monitor symptoms    BPH with concern significant hypotensive episodes of Flomax continue with Urology and upcoming procedure.

## 2018-05-24 NOTE — Procedures (Signed)
PROCEDURE: Signature verified on consent.  The patient was placed in the supine position. The microwave delivery system catheter was then introduced into the bladder per  Dr Leandro Reasoner ( TC [id number] ). Proper orientation of the rectal thermo sensing unit was noted and confirmed by proper temperature response. A catheter holder was used to maintain the microwave delivery system and rectal thermo sending unit in proper position. The units were connected to the system. The calibration process was satisfactorily completed.  Vital Signs monitored BP 110/78, P 72, R 14 with no significant change during procedure .  Levsin SL 0.125 mg 2 tabs given at start of procedure. The treatment was started and when the treatment parameters were met, timing was started and completed after 30 minutes with mds ranging 35-36. After a 5 minute cool down period, the microwave delivery system and the rectal thermo sensing unit were removed. An 9 French Foley Catheter was introduced into the bladder and retained. A drainage bag was attached. Patient instructed on foley care and removal 05/29/18 @ 7am . Patient to call our office or go to the E.R. if problems with urination occur. Voices understanding.     DISCHARGE INSTRUCTIONS:  The patient was instructed on activity resumption, the use of over the counter pain medications and was advised to call for hematuria, passing of clots, fever greater than 101, inability to void or an blockage of the catheter.    RETURN VISIT:  Patient instructed to return in a month - appointment scheduled 06/27/18 @ 3:30pm . Patient to continue all medications as directed. Voices understanding to all instructions .   Orie Fisherman, LPN.

## 2018-05-28 ENCOUNTER — Encounter (HOSPITAL_BASED_OUTPATIENT_CLINIC_OR_DEPARTMENT_OTHER): Payer: Self-pay

## 2018-05-29 ENCOUNTER — Encounter (HOSPITAL_BASED_OUTPATIENT_CLINIC_OR_DEPARTMENT_OTHER): Payer: Self-pay

## 2018-05-29 ENCOUNTER — Encounter (INDEPENDENT_AMBULATORY_CARE_PROVIDER_SITE_OTHER): Payer: Self-pay | Admitting: Family Medicine

## 2018-05-29 ENCOUNTER — Ambulatory Visit (INDEPENDENT_AMBULATORY_CARE_PROVIDER_SITE_OTHER): Payer: BC Managed Care – PPO

## 2018-05-29 DIAGNOSIS — I2699 Other pulmonary embolism without acute cor pulmonale: Secondary | ICD-10-CM

## 2018-06-11 ENCOUNTER — Other Ambulatory Visit (INDEPENDENT_AMBULATORY_CARE_PROVIDER_SITE_OTHER): Payer: Self-pay | Admitting: Cardiovascular Disease

## 2018-06-12 MED ORDER — FENOFIBRATE NANOCRYSTALLIZED 145 MG TABLET
145.0000 mg | ORAL_TABLET | Freq: Every morning | ORAL | 3 refills | Status: DC
Start: 2018-06-12 — End: 2018-06-28

## 2018-06-15 ENCOUNTER — Encounter (HOSPITAL_BASED_OUTPATIENT_CLINIC_OR_DEPARTMENT_OTHER): Payer: Self-pay

## 2018-06-19 ENCOUNTER — Other Ambulatory Visit (HOSPITAL_BASED_OUTPATIENT_CLINIC_OR_DEPARTMENT_OTHER): Payer: Self-pay | Admitting: UROLOGY

## 2018-06-19 DIAGNOSIS — N138 Other obstructive and reflux uropathy: Secondary | ICD-10-CM

## 2018-06-19 DIAGNOSIS — N401 Enlarged prostate with lower urinary tract symptoms: Principal | ICD-10-CM

## 2018-06-27 ENCOUNTER — Encounter (HOSPITAL_BASED_OUTPATIENT_CLINIC_OR_DEPARTMENT_OTHER): Payer: Self-pay | Admitting: UROLOGY

## 2018-06-27 NOTE — Telephone Encounter (Signed)
A user error has taken place: staff member opened encounter in error..

## 2018-06-28 ENCOUNTER — Ambulatory Visit (INDEPENDENT_AMBULATORY_CARE_PROVIDER_SITE_OTHER): Payer: BC Managed Care – PPO | Admitting: Cardiovascular Disease

## 2018-06-28 ENCOUNTER — Encounter (HOSPITAL_BASED_OUTPATIENT_CLINIC_OR_DEPARTMENT_OTHER): Payer: Self-pay | Admitting: Cardiovascular Disease

## 2018-06-28 VITALS — BP 138/100 | HR 73 | Resp 12 | Ht 74.5 in | Wt 227.0 lb

## 2018-06-28 DIAGNOSIS — I10 Essential (primary) hypertension: Secondary | ICD-10-CM

## 2018-06-28 DIAGNOSIS — I3139 Other pericardial effusion (noninflammatory): Secondary | ICD-10-CM

## 2018-06-28 DIAGNOSIS — I313 Pericardial effusion (noninflammatory): Secondary | ICD-10-CM

## 2018-06-28 DIAGNOSIS — I2699 Other pulmonary embolism without acute cor pulmonale: Secondary | ICD-10-CM

## 2018-06-28 DIAGNOSIS — Z86718 Personal history of other venous thrombosis and embolism: Secondary | ICD-10-CM

## 2018-06-28 DIAGNOSIS — E782 Mixed hyperlipidemia: Secondary | ICD-10-CM

## 2018-06-28 DIAGNOSIS — I25110 Atherosclerotic heart disease of native coronary artery with unstable angina pectoris: Secondary | ICD-10-CM

## 2018-06-28 MED ORDER — AMLODIPINE 10 MG TABLET
10.0000 mg | ORAL_TABLET | Freq: Every day | ORAL | 3 refills | Status: DC
Start: 2018-06-28 — End: 2019-08-19

## 2018-06-28 NOTE — Progress Notes (Signed)
[facility] MED[facility] OFFICE BUILDING D  [place]  [place] [address]  Phone: [phone]  Fax: [phone]    Encounter Date: 06/28/2018    Patient ID:  Dustin Webster  IEP:[id number]    DOB: [birth year]  Age: 61 y.o. male    Subjective:     Chief Complaint   Patient presents with   . Follow Up     HPI   This is a 62 year old white male who I follow in the office.  I had seen the patient recently in the hospital he was having extensive dizziness and lightheaded sensation.  He was having some orthostatic hypotension.  The patient underwent a stress test and had a hypotensive response therefore he was taken urgently for left heart catheterization.  The patient was noted to have patent grafts and medical therapy was recommended.  Appears that the patient was having reaction of hypotension due to his Flomax.  He has stopped the Flomax and now his blood pressure has gone up some.  He has taken intermittent amlodipine to bring his blood pressure down.  The patient states he does feel great deal better.  He did have a prostate surgery performed recently and thinks that everything is going better.      Current Outpatient Medications   Medication Sig   . allopurinol (ZYLOPRIM) 100 mg Oral Tablet Once a day   . amLODIPine (NORVASC) 10 mg Oral Tablet Take 1 Tab (10 mg total) by mouth Once a day   . ascorbic acid, vitamin C, (VITAMIN C) 500 mg Oral Tablet 0.5 Tabs Twice daily   . aspirin (ECOTRIN) 81 mg Oral Tablet, Delayed Release (E.C.) Once a day   . Calcium Carbonate (OS-CAL) 650 mg calcium (1,625 mg) Oral Tablet Every evening    . citalopram (CELEXA) 10 mg Oral Tablet Take 10 mg by mouth Once a day   . fenofibrate nanocrystallized (TRICOR) 145 mg Oral Tablet Take 145 mg by mouth Every morning with breakfast   . ferrous sulfate (FERATAB) 324 mg (65 mg iron) Oral Tablet, Delayed Release (E.C.) Take 324 mg by mouth Twice daily   . hyoscyamine sulfate (LEVSIN/SL) 0.125 mg Sublingual Tablet, Sublingual  BRING MEDICATION TO THE OFFICE ON DAY OF PROCEDURE   . KRILL OIL ORAL Take by mouth   . melatonin 1 mg Oral Tablet As directed   . multivitamin (CENTRUM SILVER) 400-250 mcg Oral Tablet, Chewable Take 1 Tab by mouth Once a day   . nitroGLYCERIN (NITROSTAT) 0.4 mg Sublingual Tablet, Sublingual 1 Tab (0.4 mg total) by Sublingual route Every 5 minutes as needed for Chest pain for 3 doses over 15 minutes   . raNITIdine (ZANTAC) 150 mg Oral Tablet Take 1 Tab by mouth Twice daily   . UBIDECARENONE (COQ-10 ORAL) Once a day   . warfarin (COUMADIN) 10 mg Oral Tablet Take 10 mg by mouth Every evening Sunday Monday, Wednesday, Friday   . Warfarin (COUMADIN) 4 mg Oral Tablet Take 8 mg by mouth Every evening Tues, Thursday, and saturday     Allergies   Allergen Reactions   . Gluten      Celiac disease     Past Medical History:   Diagnosis Date   . Anemia    . Atrial fibrillation (CMS HCC)    . BPH associated with nocturia    . CAD (coronary artery disease)    . Chronic depression 05/23/2018   . CKD (chronic kidney disease)    . Diet-controlled diabetes mellitus (  CMS [place]) 05/23/2018   . DVT (deep venous thrombosis) (CMS HCC)    . Gastroesophageal reflux disease without esophagitis 05/23/2018   . Insomnia 05/23/2018   . Malignant melanoma of right ear (CMS [place]) 05/23/2018   . Pericardial effusion    . Pulmonary emboli (CMS [facility])          Past Surgical History:   Procedure Laterality Date   . HX CHOLECYSTECTOMY     . HX CORONARY ARTERY BYPASS GRAFT     . HX HEART SURGERY  12/2015   . HX HERNIA REPAIR      Inguinal    . HX LIPOMA RESECTION      Chest   . OTHER SURGICAL HISTORY Left 01/2016    Evacuation of hematoma -Left Atrium  at [facility]    . SKIN CANCER EXCISION  06/21/2013    melanoma/ear/[facility]   . TURP VAPORIZATION           Family Medical History:     Problem Relation (Age of Onset)    Emphysema Father    Heart Attack General Family Hx    Heart Disease Mother    Hypertension (High Blood Pressure) Mother    Stroke Mother            Social  History     Tobacco Use   . Smoking status: Never Smoker   . Smokeless tobacco: Never Used   Substance Use Topics   . Alcohol use: Not Currently     Frequency: Monthly or less     Comment: rare   . Drug use: Never       Review of Systems   Constitutional: Negative for activity change, appetite change, chills, diaphoresis, fatigue, fever and unexpected weight change.   HENT: Negative for congestion, dental problem, drooling, ear discharge, ear pain, facial swelling, hearing loss, mouth sores, nosebleeds, postnasal drip, rhinorrhea, sinus pressure, sinus pain, sneezing, sore throat, tinnitus, trouble swallowing and voice change.    Eyes: Negative for photophobia, pain, discharge, redness, itching and visual disturbance.   Respiratory: Negative for apnea, cough, choking, chest tightness, shortness of breath, wheezing and stridor.    Cardiovascular: Negative for chest pain, palpitations and leg swelling.   Gastrointestinal: Negative for abdominal distention, abdominal pain, anal bleeding, blood in stool, constipation, diarrhea, nausea, rectal pain and vomiting.   Endocrine: Negative for cold intolerance, heat intolerance, polydipsia, polyphagia and polyuria.   Genitourinary: Negative for decreased urine volume, difficulty urinating, discharge, dysuria, enuresis, flank pain, frequency, genital sores, hematuria, penile pain, penile swelling, scrotal swelling, testicular pain and urgency.   Musculoskeletal: Negative for arthralgias, back pain, gait problem, joint swelling, myalgias, neck pain and neck stiffness.   Skin: Negative.  Negative for color change, pallor, rash and wound.   Allergic/Immunologic: Negative for environmental allergies, food allergies and immunocompromised state.   Neurological: Negative for dizziness, tremors, seizures, syncope, facial asymmetry, speech difficulty, light-headedness, numbness and headaches.   Hematological: Negative for adenopathy. Does not bruise/bleed easily.      Psychiatric/Behavioral: Negative for agitation, behavioral problems, confusion, decreased concentration, dysphoric mood, hallucinations, self-injury, sleep disturbance and suicidal ideas. The patient is not nervous/anxious and is not hyperactive.      Objective:   Vitals: BP (!) 138/100   Pulse 73   Resp 12   Ht 1.892 m (6' 2.5")   Wt 103 kg (227 lb)   SpO2 97%   BMI 28.76 kg/m         Physical Exam   Constitutional:  He is oriented to person, place, and time. No distress.   HENT:   Head: Normocephalic and atraumatic.   Eyes: Pupils are equal, round, and reactive to light. Conjunctivae are normal.   Neck: Normal range of motion. Neck supple.   Cardiovascular: Normal rate, regular rhythm and normal heart sounds. Exam reveals no gallop and no friction rub.   No murmur heard.  Pulmonary/Chest: Effort normal and breath sounds normal. No respiratory distress. He has no wheezes. He has no rales. He exhibits no tenderness.   Abdominal: Soft. Bowel sounds are normal. He exhibits no distension. There is no tenderness. There is no rebound.   Musculoskeletal: Normal range of motion. He exhibits no edema.   Neurological: He is alert and oriented to person, place, and time.   Skin: Skin is warm and dry. He is not diaphoretic.   Psychiatric: Judgment normal.       Assessment & Plan:     ENCOUNTER DIAGNOSES     ICD-10-CM   1. Coronary artery disease involving native coronary artery of native heart with unstable angina pectoris (CMS HCC) I25.110   2. History of DVT of lower extremity Z86.718   3. Essential hypertension I10   4. Mixed hyperlipidemia E78.2   5. Pericardial effusion I31.3   6. Pulmonary emboli (CMS HCC) I26.99       Orders Placed This Encounter   . amLODIPine (NORVASC) 10 mg Oral Tablet    1. Coronary artery disease status post coronary artery bypass grafting:  Again the patient had hypotension on the treadmill was taken for urgent left heart catheterization and his grafts were patent.  The patient will  continue with risk factor modification and medical therapy.    2. History of DVT and PE, recurrent:  Patient will continue on Coumadin with a goal INR of 2-3.    3. Hypertension:  The patient's blood pressure is creeping up.  We will start the patient on amlodipine 5 mg daily.  I have told the patient if this is not bring his blood pressure down that he can go up to 10 mg daily.    4. Hyperlipidemia:  The patient is unable to tolerate statin therapy.  And the patient was denied [place]atha.    We will see the patient back in 6 months sooner if needed.    Return in about 6 months (around 12/29/2018).    Landry Corporal, MD

## 2018-07-06 ENCOUNTER — Encounter (INDEPENDENT_AMBULATORY_CARE_PROVIDER_SITE_OTHER): Payer: Self-pay | Admitting: Family Medicine

## 2018-07-11 ENCOUNTER — Ambulatory Visit (HOSPITAL_BASED_OUTPATIENT_CLINIC_OR_DEPARTMENT_OTHER): Payer: BC Managed Care – PPO

## 2018-07-11 ENCOUNTER — Ambulatory Visit: Payer: BC Managed Care – PPO | Attending: UROLOGY | Admitting: UROLOGY

## 2018-07-11 ENCOUNTER — Encounter (HOSPITAL_BASED_OUTPATIENT_CLINIC_OR_DEPARTMENT_OTHER): Payer: Self-pay | Admitting: UROLOGY

## 2018-07-11 VITALS — BP 126/82 | Ht 75.0 in | Wt 227.6 lb

## 2018-07-11 DIAGNOSIS — N401 Enlarged prostate with lower urinary tract symptoms: Secondary | ICD-10-CM

## 2018-07-11 DIAGNOSIS — N138 Other obstructive and reflux uropathy: Principal | ICD-10-CM

## 2018-07-11 DIAGNOSIS — N529 Male erectile dysfunction, unspecified: Secondary | ICD-10-CM

## 2018-07-11 DIAGNOSIS — Z9889 Other specified postprocedural states: Secondary | ICD-10-CM

## 2018-07-11 DIAGNOSIS — R351 Nocturia: Secondary | ICD-10-CM

## 2018-07-11 LAB — URINALYSIS, MACROSCOPIC
BILIRUBIN: NOT DETECTED mg/dL
BLOOD: NOT DETECTED mg/dL
GLUCOSE: NOT DETECTED mg/dL
KETONES: NOT DETECTED mg/dL
LEUKOCYTES: NOT DETECTED WBCs/uL
NITRITE: NOT DETECTED
PH: 6 (ref 5.0–8.0)
SPECIFIC GRAVITY: 1.015 (ref 1.005–1.030)
UROBILINOGEN: 0.2 mg/dL

## 2018-07-11 MED ORDER — SILDENAFIL (PULMONARY HYPERTENSION) 20 MG TABLET
ORAL_TABLET | ORAL | 5 refills | Status: DC
Start: 2018-07-11 — End: 2018-09-10

## 2018-07-11 NOTE — H&P (Signed)
[facility]  [place]  [place] [address]  [phone]    Date: 07/11/2018  Name: Dustin Webster  Age: 62 y.o.      Chief of Complaint:   Chief Complaint   Patient presents with   . Benign Prostatic Hypertrophy     Patient presents in office today for yearly follow up from TUMT done 05/24/18. Patient states his symptoms have improved. He still has some nocturia 2-3x but he has a much stronger urine stream. He also has been having trouble with erections for about 6 months.        Dustin Webster is a 62 y.o. male  who presents to the practice today for follow-up after TUMT.  He reports his stream is significantly better and he is off Flomax.  He does complain of some chronic erectile dysfunction.  He does use Cialis and Viagra in the past with some improvement.  He reports that he has never use nitroglycerin since his cardiac catheterization.        Patient Active Problem List    Diagnosis Date Noted   . BPH with obstruction/lower urinary tract symptoms 07/11/2018   . Hypertrophy of prostate 05/23/2018   . Malignant melanoma of right ear (CMS [facility]) 05/23/2018   . Diet-controlled diabetes mellitus (CMS [facility]) 05/23/2018   . Gastroesophageal reflux disease without esophagitis 05/23/2018   . Chronic depression 05/23/2018   . History of DVT of lower extremity 05/23/2018   . External hemorrhoid 05/23/2018   . Leg cramps, sleep related 05/23/2018   . Insomnia 05/23/2018   . Allergic rhinitis 05/23/2018   . History of open heart surgery 05/23/2018   . Unstable angina (CMS HCC) 05/07/2018   . Chest pain 05/07/2018   . Anemia    . Pulmonary emboli (CMS HCC) 01/14/2017   . Mixed hyperlipidemia 01/14/2017   . HTN (hypertension) 01/16/2016   . Pericardial effusion 01/16/2016   . Benign prostatic hyperplasia 01/02/2016   . Chronic gout 01/02/2016   . CAD (coronary artery disease), native coronary artery 12/26/2015   . Celiac disease 12/26/2015   . VTE (venous thromboembolism) 12/26/2015                  Past Medical History:   Diagnosis Date   . Anemia    . Atrial fibrillation (CMS HCC)    . BPH associated with nocturia    . CAD (coronary artery disease)    . Chronic depression 05/23/2018   . CKD (chronic kidney disease)    . Diet-controlled diabetes mellitus (CMS [facility]) 05/23/2018   . DVT (deep venous thrombosis) (CMS HCC)    . Gastroesophageal reflux disease without esophagitis 05/23/2018   . Insomnia 05/23/2018   . Malignant melanoma of right ear (CMS [facility]) 05/23/2018   . Pericardial effusion    . Pulmonary emboli (CMS [facility])          Past Surgical History:   Procedure Laterality Date   . HX CHOLECYSTECTOMY     . HX CORONARY ARTERY BYPASS GRAFT     . HX HEART SURGERY  12/2015   . HX HERNIA REPAIR      Inguinal    . HX LIPOMA RESECTION      Chest   . OTHER SURGICAL HISTORY Left 01/2016    Evacuation of hematoma -Left Atrium  at [facility]    . SKIN CANCER EXCISION  06/21/2013    melanoma/ear/[facility]   . TRANSURETHRAL MICROWAVE THERAPY  05/24/2018    Dr Leandro Reasoner   . TURP VAPORIZATION           Allergies   Allergen Reactions   . Gluten      Celiac disease     Current Outpatient Medications   Medication Sig Dispense Refill   . allopurinol (ZYLOPRIM) 100 mg Oral Tablet Once a day     . amLODIPine (NORVASC) 10 mg Oral Tablet Take 1 Tab (10 mg total) by mouth Once a day 90 Tab 3   . ascorbic acid, vitamin C, (VITAMIN C) 500 mg Oral Tablet 0.5 Tabs Twice daily     . aspirin (ECOTRIN) 81 mg Oral Tablet, Delayed Release (E.C.) Once a day     . Calcium Carbonate (OS-CAL) 650 mg calcium (1,625 mg) Oral Tablet Every evening      . citalopram (CELEXA) 10 mg Oral Tablet Take 10 mg by mouth Once a day     . fenofibrate nanocrystallized (TRICOR) 145 mg Oral Tablet Take 145 mg by mouth Every morning with breakfast     . ferrous sulfate (FERATAB) 324 mg (65 mg iron) Oral Tablet, Delayed Release (E.C.) Take 324 mg by mouth Twice daily     . hyoscyamine sulfate (LEVSIN/SL) 0.125 mg Sublingual Tablet, Sublingual BRING MEDICATION TO THE OFFICE ON DAY  OF PROCEDURE  0   . KRILL OIL ORAL Take by mouth     . melatonin 1 mg Oral Tablet As directed     . multivitamin (CENTRUM SILVER) 400-250 mcg Oral Tablet, Chewable Take 1 Tab by mouth Once a day     . nitroGLYCERIN (NITROSTAT) 0.4 mg Sublingual Tablet, Sublingual 1 Tab (0.4 mg total) by Sublingual route Every 5 minutes as needed for Chest pain for 3 doses over 15 minutes 25 Tab 3   . raNITIdine (ZANTAC) 150 mg Oral Tablet Take 1 Tab by mouth Twice daily     . sildenafil, antihypertensive, (REVATIO) 20 mg Oral Tablet 2 to 5 tabs po q day prn 50 Tab 5   . UBIDECARENONE (COQ-10 ORAL) Once a day     . warfarin (COUMADIN) 10 mg Oral Tablet Take 10 mg by mouth Every evening Sunday Monday, Wednesday, Friday     . Warfarin (COUMADIN) 4 mg Oral Tablet Take 8 mg by mouth Every evening Tues, Thursday, and saturday       No current facility-administered medications for this visit.       Family Medical History:     Problem Relation (Age of Onset)    Emphysema Father    Heart Attack General Family Hx    Heart Disease Mother    Hypertension (High Blood Pressure) Mother    Stroke Mother            Social History     Socioeconomic History   . Marital status: Married     Spouse name: Levada Dy   . Number of children: 1   . Years of education: Not on file   . Highest education level: Not on file   Occupational History   . 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 Financial resource strain: Not on file   . Food insecurity:     Worry: Not on file     Inability: Not on file   . Transportation needs:     Medical: Not on file     Non-medical: Not on file   Tobacco Use   . Smoking  status: Never Smoker   . Smokeless tobacco: Never Used   Substance and Sexual Activity   . Alcohol use: Not Currently     Frequency: Monthly or less     Comment: rare   . Drug use: Never   . Sexual activity: Not on file   Lifestyle   . Physical activity:     Days per week: Not on file     Minutes per session: Not on file   .  Stress: Not on file   Relationships   . Social connections:     Talks on phone: Not on file     Gets together: Not on file     Attends religious service: Not on file     Active member of club or organization: Not on file     Attends meetings of clubs or organizations: Not on file     Relationship status: Not on file   . Intimate partner violence:     Fear of current or ex partner: Not on file     Emotionally abused: Not on file     Physically abused: Not on file     Forced sexual activity: Not on file   Other Topics Concern   . Not on file   Social History Narrative   . Not on file        ROS: A 10 point review of systems is negative except for the HPI.    OBJECTIVE:   BP 126/82   Ht 1.905 m (6\' 3" )   Wt 103.2 kg (227 lb 9.6 oz)   BMI 28.45 kg/m        Body mass index is 28.45 kg/m.     Exam:    GENERAL: Alert and Oriented, No Apparent Distress  SKIN: Clear  HEENT: NC/AT  NECK: Supple  ABDOMEN: Soft, NT, Non-distended, No Masses  EXTERNAL GENITALIA: Both testicles appear normal with no masses, Penis has no lesions  ANORECTAL EXAM:  Deferred  EXTREMITIES: No clubbing, cyanosis, or edema    Lab Results   Component Value Date/Time    COLOR Yellow 03/07/2018 07:51 AM    SPECGRAVUR 1.020 03/07/2018 07:51 AM    PHURINE 6.0 03/07/2018 07:51 AM    PROTEIN Not Detected 03/07/2018 07:51 AM    GLUCOSE Not Detected 03/07/2018 07:51 AM    KETONES Not Detected 03/07/2018 07:51 AM    UROBILINOGEN 0.2  03/07/2018 07:51 AM    LEUKOCYTES Not Detected 03/07/2018 07:51 AM    NITRITE Not Detected 03/07/2018 07:51 AM    WBC 4.2 (L) 05/11/2018 05:47 AM    RBC 4.31 (L) 05/11/2018 05:47 AM    BILIRUBIN Not Detected 03/07/2018 07:51 AM    HGBURINE Not Detected 03/07/2018 07:51 AM          ASSESSMENT & PLAN:       ICD-10-CM    1. Nocturia R35.1    2. BPH with obstruction/lower urinary tract symptoms N40.1     N13.8    3. Erectile dysfunction, unspecified erectile dysfunction type N52.9      Return in about 6 months (around  01/11/2019).    I gave him prescription for sildenafil 20 mg 2-5 tabs p.o. Q.day p.r.n..         This note may have been fully or partially generated using MModal Fluency Direct system, and there may be some incorrect words, spellings, and punctuation that were not noted in checking the note before saving.    Graylon Gunning,  MD  07/11/2018, 11:20

## 2018-07-30 ENCOUNTER — Ambulatory Visit (INDEPENDENT_AMBULATORY_CARE_PROVIDER_SITE_OTHER): Payer: BC Managed Care – PPO

## 2018-07-30 DIAGNOSIS — I2699 Other pulmonary embolism without acute cor pulmonale: Secondary | ICD-10-CM

## 2018-08-13 ENCOUNTER — Other Ambulatory Visit (INDEPENDENT_AMBULATORY_CARE_PROVIDER_SITE_OTHER): Payer: Self-pay | Admitting: Family Medicine

## 2018-08-28 ENCOUNTER — Other Ambulatory Visit (INDEPENDENT_AMBULATORY_CARE_PROVIDER_SITE_OTHER): Payer: Self-pay | Admitting: Family Medicine

## 2018-08-30 ENCOUNTER — Ambulatory Visit (INDEPENDENT_AMBULATORY_CARE_PROVIDER_SITE_OTHER): Payer: BC Managed Care – PPO

## 2018-08-30 DIAGNOSIS — I2699 Other pulmonary embolism without acute cor pulmonale: Secondary | ICD-10-CM

## 2018-09-05 ENCOUNTER — Encounter (INDEPENDENT_AMBULATORY_CARE_PROVIDER_SITE_OTHER): Payer: Self-pay | Admitting: Family Medicine

## 2018-09-10 ENCOUNTER — Other Ambulatory Visit (INDEPENDENT_AMBULATORY_CARE_PROVIDER_SITE_OTHER): Payer: BC Managed Care – PPO

## 2018-09-10 ENCOUNTER — Ambulatory Visit (INDEPENDENT_AMBULATORY_CARE_PROVIDER_SITE_OTHER): Payer: BC Managed Care – PPO | Admitting: Family Medicine

## 2018-09-10 VITALS — BP 124/84 | HR 64 | Temp 97.2°F | Ht 74.5 in | Wt 230.0 lb

## 2018-09-10 DIAGNOSIS — Z6829 Body mass index (BMI) 29.0-29.9, adult: Secondary | ICD-10-CM

## 2018-09-10 DIAGNOSIS — K219 Gastro-esophageal reflux disease without esophagitis: Secondary | ICD-10-CM

## 2018-09-10 DIAGNOSIS — E782 Mixed hyperlipidemia: Secondary | ICD-10-CM

## 2018-09-10 DIAGNOSIS — I25110 Atherosclerotic heart disease of native coronary artery with unstable angina pectoris: Secondary | ICD-10-CM

## 2018-09-10 DIAGNOSIS — I2699 Other pulmonary embolism without acute cor pulmonale: Secondary | ICD-10-CM

## 2018-09-10 DIAGNOSIS — E119 Type 2 diabetes mellitus without complications: Secondary | ICD-10-CM

## 2018-09-10 DIAGNOSIS — I10 Essential (primary) hypertension: Secondary | ICD-10-CM

## 2018-09-10 LAB — LIPID PANEL
CHOLESTEROL: 141 mg/dL (ref 0–199)
HDL CHOL: 40 mg/dL (ref 40–60)
LDL CALC: 81 mg/dL (ref 0–130)
LDL CALC: 81 mg/dL (ref 0–130)
TRIGLYCERIDES: 102 mg/dL (ref 0–149)
VLDL CALC: 20 mg/dL

## 2018-09-10 LAB — CBC WITH DIFF
BASOPHIL #: 0.03 x10ˆ3/uL (ref 0.00–0.10)
BASOPHIL %: 1 % (ref 0–1)
EOSINOPHIL #: 0.22 x10ˆ3/uL (ref 0.00–0.40)
EOSINOPHIL %: 6 % (ref 0–6)
HCT: 41.7 % (ref 40.7–52.3)
HGB: 13.3 g/dL (ref 12.5–16.9)
LYMPHOCYTE #: 1.24 x10ˆ3/uL (ref 1.00–3.70)
LYMPHOCYTE %: 33 % (ref 14–45)
MCH: 27.8 pg (ref 27.0–33.4)
MCHC: 31.9 g/dL (ref 31.0–37.0)
MCV: 87.2 fL (ref 84.1–99.3)
MONOCYTE #: 0.39 x10ˆ3/uL (ref 0.00–0.70)
MONOCYTE %: 10 % (ref 0–14)
NEUTROPHIL #: 1.87 x10ˆ3/uL (ref 1.50–7.00)
NEUTROPHIL %: 50 % (ref 43–80)
PLATELETS: 215 x10ˆ3/uL (ref 129–381)
RBC: 4.78 x10ˆ6/uL (ref 4.09–5.89)
RDW: 15.5 % — ABNORMAL HIGH (ref 10.8–15.2)
WBC: 3.8 10*3/uL (ref 2.6–9.8)

## 2018-09-10 LAB — THYROID STIMULATING HORMONE (SENSITIVE TSH): TSH: 1.62 u[IU]/mL (ref 0.465–4.680)

## 2018-09-10 LAB — COMPREHENSIVE METABOLIC PNL, FASTING
ALBUMIN: 4.5 g/dL (ref 3.5–5.0)
ALKALINE PHOSPHATASE: 38 U/L (ref 38–126)
ALT (SGPT): 36 U/L (ref 21–72)
ANION GAP: 9 mmol/L
AST (SGOT): 28 U/L (ref 17–59)
BILIRUBIN TOTAL: 0.3 mg/dL (ref 0.2–5.0)
BUN/CREA RATIO: 16
BUN: 23 mg/dL — ABNORMAL HIGH (ref 9–20)
CALCIUM: 9.8 mg/dL (ref 8.4–10.2)
CHLORIDE: 106 mmol/L (ref 98–107)
CO2 TOTAL: 26 mmol/L (ref 22–30)
CREATININE: 1.4 mg/dL — ABNORMAL HIGH (ref 0.66–1.25)
ESTIMATED GFR: 51 mL/min/1.73mˆ2 — ABNORMAL LOW (ref >60)
GLUCOSE: 103 mg/dL (ref 74–106)
POTASSIUM: 4.6 mmol/L (ref 3.5–5.1)
PROTEIN TOTAL: 7.4 g/dL (ref 6.3–8.2)
SODIUM: 141 mmol/L (ref 137–145)

## 2018-09-10 LAB — HGA1C (HEMOGLOBIN A1C WITH EST AVG GLUCOSE)
ESTIMATED AVERAGE GLUCOSE: 126 mg/dL
HEMOGLOBIN A1C: 6 % (ref 4.3–6.1)

## 2018-09-10 LAB — MAGNESIUM: MAGNESIUM: 2.1 mg/dL (ref 1.9–2.3)

## 2018-09-10 MED ORDER — FENOFIBRATE NANOCRYSTALLIZED 145 MG TABLET
145.00 mg | ORAL_TABLET | Freq: Every morning | ORAL | 1 refills | Status: DC
Start: 2018-09-10 — End: 2018-09-13

## 2018-09-10 NOTE — Progress Notes (Signed)
Subjective:     Patient ID:  Dustin Webster is an 62 y.o. male   Chief Complaint:    Chief Complaint   Patient presents with   . Hypertension     Patient states BP has been doing well. Patient continues taking Norvasc. Patient continues reporting lightheadedness with orthostatic changes such as getting up from a sitting or bending forward position.   . Hyperlipidemia     Patient continues taking Tricor   . Esophageal Reflux     Patient doing well with Pepcid. No breakthrough symptoms or need for watching foods. Patient asking if Pepcid affects renal values.     Patient here for routine follow-up.  Overall doing quite well.  Had been hospitalized for chest pain.  States since starting Zantac this has completely resolved.  No GI complaints no palpitations no shortness of breath.  However concern with recent issues with Zantac.  Wonders what it can be replaced with.  Needing to avoid PPI due to mild renal disease.    Hypertension has remained stable.  Will still have very brief orthostatic changes with sudden change of position but even this significantly better.  Denies any vertigo    Coronary artery disease remains stable well.  Again see above.  Was hospitalized with chest pain but found to be all GI in nature.  Previous bypasses remains stable there is no effusion or other.  Again states feels very well just back from a mission trip and remaining quite active without symptoms    Continues with tricore for hyperlipidemia.  Also watches diet closely with history of diet-controlled diabetes and celiac disease.  Doing quite well and sugars controlled.  Is due for repeat labs.    Our current PE.  Remains on chronic Coumadin for this and coronary artery disease.  INRs followed by Cardiology.  HPI  Past Medical History:   Diagnosis Date   . Anemia    . Atrial fibrillation (CMS HCC)    . BPH associated with nocturia    . CAD (coronary artery disease)    . Chronic depression 05/23/2018   . CKD (chronic kidney disease)    .  Diet-controlled diabetes mellitus (CMS [facility]) 05/23/2018   . DVT (deep venous thrombosis) (CMS HCC)    . Gastroesophageal reflux disease without esophagitis 05/23/2018   . Insomnia 05/23/2018   . Malignant melanoma of right ear (CMS [facility]) 05/23/2018   . Pericardial effusion    . Pulmonary emboli (CMS [facility])      Past Surgical History:   Procedure Laterality Date   . CARD INFERIOR VENA CAVA FILTER REMOVAL N/A 01/17/2017    Performed by [name], MD at [facility]   . CORS & LHC W/BYPASS GRAFTS W/WO LVG N/A 05/11/2018    Performed by [name], MD at [place]   . HX CHOLECYSTECTOMY     . HX CORONARY ARTERY BYPASS GRAFT     . HX HEART SURGERY  12/2015   . HX HERNIA REPAIR      Inguinal    . HX LIPOMA RESECTION      Chest   . OTHER SURGICAL HISTORY Left 01/2016    Evacuation of hematoma -Left Atrium  at [facility]    . SKIN CANCER EXCISION  06/21/2013    melanoma/ear/[facility]   . TRANSURETHRAL MICROWAVE THERAPY  05/24/2018    Dr Leandro Reasoner   . TURP VAPORIZATION       Current Outpatient Medications   Medication  Sig   . allopurinol (ZYLOPRIM) 100 mg Oral Tablet TAKE 1 TABLET DAILY   . amLODIPine (NORVASC) 10 mg Oral Tablet Take 1 Tab (10 mg total) by mouth Once a day   . ascorbic acid, vitamin C, (VITAMIN C) 500 mg Oral Tablet 0.5 Tabs Twice daily   . aspirin (ECOTRIN) 81 mg Oral Tablet, Delayed Release (E.C.) Once a day   . Calcium Carbonate (OS-CAL) 650 mg calcium (1,625 mg) Oral Tablet Every evening    . citalopram (CELEXA) 10 mg Oral Tablet Take 10 mg by mouth Once a day   . famotidine (PEPCID) 20 mg Oral Tablet Take 20 mg by mouth Twice daily   . fenofibrate nanocrystallized (TRICOR) 145 mg Oral Tablet TAKE 1 TABLET DAILY   . ferrous sulfate (FERATAB) 324 mg (65 mg iron) Oral Tablet, Delayed Release (E.C.) Take 324 mg by mouth Twice daily   . KRILL OIL ORAL Take by mouth   . LUTEIN ORAL Take by mouth   . melatonin 1 mg Oral Tablet As directed   . multivitamin (CENTRUM SILVER) 400-250 mcg Oral Tablet, Chewable  Take 1 Tab by mouth Once a day Taking different brank with no vit K   . nitroGLYCERIN (NITROSTAT) 0.4 mg Sublingual Tablet, Sublingual 1 Tab (0.4 mg total) by Sublingual route Every 5 minutes as needed for Chest pain for 3 doses over 15 minutes   . UBIDECARENONE (COQ-10 ORAL) Once a day   . warfarin (COUMADIN) 10 mg Oral Tablet Take 10 mg by mouth Every evening Sunday Monday, Wednesday, Friday   . Warfarin (COUMADIN) 4 mg Oral Tablet Take 4 mg by mouth Every evening     Allergies   Allergen Reactions   . Gluten      Celiac disease     Family Medical History:     Problem Relation (Age of Onset)    Emphysema Father    Heart Attack General Family Hx    Heart Disease Mother    Hypertension (High Blood Pressure) Mother    Stroke Mother          Review of Systems   Constitutional: Negative for fatigue, fever and unexpected weight change.   Respiratory: Negative for chest tightness and shortness of breath.    Cardiovascular: Negative for chest pain, palpitations and leg swelling.   Gastrointestinal: Negative for abdominal pain.   Musculoskeletal: Negative for arthralgias.   Neurological: Negative for dizziness and light-headedness.   Psychiatric/Behavioral: Negative for dysphoric mood.     Objective:   Physical Exam   Constitutional: He is oriented to person, place, and time. No distress.   Neck: Neck supple. No JVD present. No thyromegaly present.   Cardiovascular: Normal rate and regular rhythm.   No murmur heard.  Pulmonary/Chest: Effort normal and breath sounds normal. He has no wheezes.   Abdominal: Soft. Bowel sounds are normal. He exhibits no mass. There is no tenderness.   Musculoskeletal: He exhibits no edema.   Neurological: He is alert and oriented to person, place, and time. Coordination normal.   Psychiatric: He has a normal mood and affect.   Vitals reviewed.    BP 124/84 (Site: Right, Patient Position: Sitting, Cuff Size: Adult)   Pulse 64   Temp 36.2 C (97.2 F) (Tympanic)   Ht 1.892 m (6' 2.5")   Wt  104.3 kg (230 lb)   SpO2 99%   BMI 29.14 kg/m         Assessment & Plan:  ICD-10-CM    1. BMI 29.0-29.9,adult Z68.29    2. Mixed hyperlipidemia E78.2 LIPID PANEL     COMPREHENSIVE METABOLIC PNL, FASTING     THYROID STIMULATING HORMONE (SENSITIVE TSH)     DISCONTINUED: fenofibrate nanocrystallized (TRICOR) 145 mg Oral Tablet   3. Essential hypertension I10 CBC/DIFF   4. Coronary artery disease involving native coronary artery of native heart with unstable angina pectoris (CMS [facility]) I25.110    5. Pulmonary embolism, unspecified chronicity, unspecified pulmonary embolism type, unspecified whether acute cor pulmonale present (CMS [facility]) I26.99    6. Diet-controlled diabetes mellitus (CMS [facility]) E11.9 MAGNESIUM     HGA1C (HEMOGLOBIN A1C WITH EST AVG GLUCOSE)   7. Gastroesophageal reflux disease without esophagitis K21.9        Hyperlipidemia stable no changes continue with diet and monitor labs    Diet-controlled diabetes doing well following celiac diet continue same and monitor labs    Coronary artery disease stable continue with Cardiology    GERD doing well with H2 blocker.  Discussed changing to Pepcid and monitor symptoms    Recurrent PE stable with lifelong Coumadin continue same    Hypertension stable no change

## 2018-09-13 ENCOUNTER — Other Ambulatory Visit (INDEPENDENT_AMBULATORY_CARE_PROVIDER_SITE_OTHER): Payer: Self-pay | Admitting: Family Medicine

## 2018-09-13 DIAGNOSIS — E782 Mixed hyperlipidemia: Secondary | ICD-10-CM

## 2018-09-17 NOTE — Progress Notes (Signed)
Patient advised and reports understanding.

## 2018-09-18 ENCOUNTER — Other Ambulatory Visit (INDEPENDENT_AMBULATORY_CARE_PROVIDER_SITE_OTHER): Payer: Self-pay | Admitting: Family Medicine

## 2018-09-18 MED ORDER — CITALOPRAM 10 MG TABLET
10.0000 mg | ORAL_TABLET | Freq: Every day | ORAL | 1 refills | Status: DC
Start: 2018-09-18 — End: 2019-02-14

## 2018-09-28 ENCOUNTER — Other Ambulatory Visit (INDEPENDENT_AMBULATORY_CARE_PROVIDER_SITE_OTHER): Payer: Self-pay

## 2018-10-01 ENCOUNTER — Ambulatory Visit (INDEPENDENT_AMBULATORY_CARE_PROVIDER_SITE_OTHER): Payer: BC Managed Care – PPO

## 2018-10-01 DIAGNOSIS — Z7901 Long term (current) use of anticoagulants: Secondary | ICD-10-CM

## 2018-10-01 DIAGNOSIS — I2699 Other pulmonary embolism without acute cor pulmonale: Secondary | ICD-10-CM

## 2018-10-15 ENCOUNTER — Other Ambulatory Visit (HOSPITAL_BASED_OUTPATIENT_CLINIC_OR_DEPARTMENT_OTHER): Payer: Self-pay | Admitting: Gastroenterology

## 2018-10-18 ENCOUNTER — Other Ambulatory Visit (INDEPENDENT_AMBULATORY_CARE_PROVIDER_SITE_OTHER): Payer: Self-pay | Admitting: Family Medicine

## 2018-10-23 ENCOUNTER — Encounter (HOSPITAL_BASED_OUTPATIENT_CLINIC_OR_DEPARTMENT_OTHER): Payer: Self-pay | Admitting: UROLOGY

## 2018-10-31 ENCOUNTER — Ambulatory Visit (INDEPENDENT_AMBULATORY_CARE_PROVIDER_SITE_OTHER): Payer: BC Managed Care – PPO

## 2018-10-31 DIAGNOSIS — I2699 Other pulmonary embolism without acute cor pulmonale: Secondary | ICD-10-CM

## 2018-12-03 ENCOUNTER — Encounter (HOSPITAL_BASED_OUTPATIENT_CLINIC_OR_DEPARTMENT_OTHER): Payer: Self-pay

## 2018-12-04 ENCOUNTER — Ambulatory Visit (INDEPENDENT_AMBULATORY_CARE_PROVIDER_SITE_OTHER): Payer: BC Managed Care – PPO

## 2018-12-04 DIAGNOSIS — I2699 Other pulmonary embolism without acute cor pulmonale: Secondary | ICD-10-CM

## 2019-01-04 ENCOUNTER — Encounter (HOSPITAL_BASED_OUTPATIENT_CLINIC_OR_DEPARTMENT_OTHER): Payer: Self-pay | Admitting: Cardiovascular Disease

## 2019-01-04 ENCOUNTER — Ambulatory Visit (INDEPENDENT_AMBULATORY_CARE_PROVIDER_SITE_OTHER): Payer: BC Managed Care – PPO | Admitting: Cardiovascular Disease

## 2019-01-04 VITALS — BP 126/79 | HR 67 | Resp 16 | Ht 75.0 in | Wt 241.2 lb

## 2019-01-04 DIAGNOSIS — I25110 Atherosclerotic heart disease of native coronary artery with unstable angina pectoris: Secondary | ICD-10-CM

## 2019-01-04 DIAGNOSIS — I2699 Other pulmonary embolism without acute cor pulmonale: Secondary | ICD-10-CM

## 2019-01-04 DIAGNOSIS — I10 Essential (primary) hypertension: Secondary | ICD-10-CM

## 2019-01-04 DIAGNOSIS — Z86718 Personal history of other venous thrombosis and embolism: Secondary | ICD-10-CM

## 2019-01-04 DIAGNOSIS — E782 Mixed hyperlipidemia: Secondary | ICD-10-CM

## 2019-01-04 NOTE — Progress Notes (Signed)
CARDIOLOGY CLINIC, MEDICAL OFFICE BUILDING D  [place]  [place] [address]  Phone: [phone]  Fax: [phone]    Encounter Date: 01/04/2019    Patient ID:  Dustin Webster  DEY:[id number]    DOB: Apr 02, 1956  Age: 63 y.o. male    Subjective:     Chief Complaint   Patient presents with   . Other     HPI   This is a 63 year old white male who I follow in the office.  The patient has a history of coronary artery disease and is status post coronary artery bypass grafting.  Following the bypass the patient had a large hematoma and was urgently transferred to [facility] where he had this extracted.  The patient's anticoagulation was on hold during this time he developed another pulmonary embolism.  The patient continues on Coumadin and is doing well with this.  He does deny any bleeding issues.  The patient had an episode in which she became very hypotensive and he was taken urgently for heart catheterization which showed his grafts to be patent.  Medical therapy was recommended.  The patient states that he does get a little bit dizziness when leading down and then leaning back up.  He does deny any recent chest pain, increased shortness of breath, palpitations, orthopnea, or PND.  He has been taking all his medications as prescribed.  He states his blood pressure has been under good control.      Current Outpatient Medications   Medication Sig   . allopurinol (ZYLOPRIM) 100 mg Oral Tablet TAKE 1 TABLET DAILY   . amLODIPine (NORVASC) 10 mg Oral Tablet Take 1 Tab (10 mg total) by mouth Once a day   . ascorbic acid, vitamin C, (VITAMIN C) 500 mg Oral Tablet 0.5 Tabs Twice daily   . aspirin (ECOTRIN) 81 mg Oral Tablet, Delayed Release (E.C.) Once a day   . Calcium Carbonate (OS-CAL) 650 mg calcium (1,625 mg) Oral Tablet Every evening    . citalopram (CELEXA) 10 mg Oral Tablet Take 1 Tab (10 mg total) by mouth Once a day   . famotidine (PEPCID) 20 mg Oral Tablet Take 20 mg by mouth Twice daily   .  fenofibrate nanocrystallized (TRICOR) 145 mg Oral Tablet TAKE 1 TABLET DAILY   . ferrous sulfate (FERATAB) 324 mg (65 mg iron) Oral Tablet, Delayed Release (E.C.) Take 324 mg by mouth Twice daily   . KRILL OIL ORAL Take by mouth   . LUTEIN ORAL Take by mouth   . melatonin 1 mg Oral Tablet As directed   . multivitamin (CENTRUM SILVER) 400-250 mcg Oral Tablet, Chewable Take 1 Tab by mouth Once a day Taking different brank with no vit K   . nitroGLYCERIN (NITROSTAT) 0.4 mg Sublingual Tablet, Sublingual 1 Tab (0.4 mg total) by Sublingual route Every 5 minutes as needed for Chest pain for 3 doses over 15 minutes   . pantoprazole (PROTONIX) 40 mg Oral Tablet, Delayed Release (E.C.) TAKE 1 TABLET DAILY   . UBIDECARENONE (COQ-10 ORAL) Once a day   . warfarin (COUMADIN) 10 mg Oral Tablet Take 10 mg by mouth Every evening Sunday Monday, Wednesday, Friday   . Warfarin (COUMADIN) 4 mg Oral Tablet Take 4 mg by mouth Every evening     Allergies   Allergen Reactions   . Gluten      Celiac disease     Past Medical History:   Diagnosis Date   . Anemia    .  Atrial fibrillation (CMS HCC)    . BPH associated with nocturia    . CAD (coronary artery disease)    . Chronic depression 05/23/2018   . CKD (chronic kidney disease)    . Diet-controlled diabetes mellitus (CMS [place]) 05/23/2018   . DVT (deep venous thrombosis) (CMS HCC)    . Gastroesophageal reflux disease without esophagitis 05/23/2018   . Insomnia 05/23/2018   . 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 Pericardial effusion    . Pulmonary emboli (CMS [facility])          Past Surgical History:   Procedure Laterality Date   . HX CHOLECYSTECTOMY     . HX CORONARY ARTERY BYPASS GRAFT     . HX HEART SURGERY  12/2015   . HX HERNIA REPAIR      Inguinal    . HX LIPOMA RESECTION      Chest   . OTHER SURGICAL HISTORY Left 01/2016    Evacuation of hematoma -Left Atrium  at [facility]    . SKIN CANCER EXCISION  06/21/2013    melanoma/ear/[facility]   . TRANSURETHRAL MICROWAVE THERAPY  05/24/2018    Dr  Leandro Reasoner   . TURP VAPORIZATION           Family Medical History:     Problem Relation (Age of Onset)    Emphysema Father    Heart Attack General Family Hx    Heart Disease Mother    Hypertension (High Blood Pressure) Mother    Stroke Mother            Social History     Tobacco Use   . Smoking status: Never Smoker   . Smokeless tobacco: Never Used   Substance Use Topics   . Alcohol use: Not Currently     Frequency: Monthly or less     Comment: rare   . Drug use: Never       Review of Systems   Constitutional: Negative for activity change, appetite change, chills, diaphoresis, fatigue, fever and unexpected weight change.   HENT: Negative for congestion, dental problem, drooling, ear discharge, ear pain, facial swelling, hearing loss, mouth sores, nosebleeds, postnasal drip, rhinorrhea, sinus pressure, sinus pain, sneezing, sore throat, tinnitus, trouble swallowing and voice change.    Eyes: Negative for photophobia, pain, discharge, redness, itching and visual disturbance.   Respiratory: Negative for apnea, cough, choking, chest tightness, shortness of breath, wheezing and stridor.    Cardiovascular: Negative for chest pain, palpitations and leg swelling.   Gastrointestinal: Negative for abdominal distention, abdominal pain, anal bleeding, blood in stool, constipation, diarrhea, nausea, rectal pain and vomiting.   Endocrine: Negative for cold intolerance, heat intolerance, polydipsia, polyphagia and polyuria.   Genitourinary: Negative for decreased urine volume, difficulty urinating, discharge, dysuria, enuresis, flank pain, frequency, genital sores, hematuria, penile pain, penile swelling, scrotal swelling, testicular pain and urgency.   Musculoskeletal: Negative for arthralgias, back pain, gait problem, joint swelling, myalgias, neck pain and neck stiffness.   Skin: Negative.  Negative for color change, pallor, rash and wound.   Allergic/Immunologic: Negative for environmental allergies, food allergies and  immunocompromised state.   Neurological: Negative for dizziness, tremors, seizures, syncope, facial asymmetry, speech difficulty, light-headedness, numbness and headaches.   Hematological: Negative for adenopathy. Does not bruise/bleed easily.   Psychiatric/Behavioral: Negative for agitation, behavioral problems, confusion, decreased concentration, dysphoric mood, hallucinations, self-injury, sleep disturbance and suicidal ideas. The patient is not nervous/anxious and is not hyperactive.  Objective:   Vitals: BP 126/79 Comment: right  Pulse 67   Resp 16   Ht 1.905 m (6' 3" )   Wt 109.4 kg (241 lb 3.2 oz)   SpO2 99%   BMI 30.15 kg/m         Physical Exam  Constitutional:       General: He is not in acute distress.     Appearance: He is not diaphoretic.   HENT:      Head: Normocephalic and atraumatic.   Eyes:      Conjunctiva/sclera: Conjunctivae normal.      Pupils: Pupils are equal, round, and reactive to light.   Neck:      Musculoskeletal: Normal range of motion and neck supple.   Cardiovascular:      Rate and Rhythm: Normal rate and regular rhythm.      Heart sounds: Normal heart sounds. No murmur. No friction rub. No gallop.    Pulmonary:      Effort: Pulmonary effort is normal. No respiratory distress.      Breath sounds: Normal breath sounds. No wheezing or rales.   Chest:      Chest wall: No tenderness.   Abdominal:      General: Bowel sounds are normal. There is no distension.      Palpations: Abdomen is soft.      Tenderness: There is no abdominal tenderness. There is no rebound.   Musculoskeletal: Normal range of motion.   Skin:     General: Skin is warm and dry.   Neurological:      Mental Status: He is alert and oriented to person, place, and time.   Psychiatric:         Judgment: Judgment normal.         Assessment & Plan:     ENCOUNTER DIAGNOSES     ICD-10-CM   1. Coronary artery disease involving native coronary artery of native heart with unstable angina pectoris (CMS HCC) I25.110   2.  History of DVT of lower extremity Z86.718   3. Essential hypertension I10   4. Mixed hyperlipidemia E78.2   5. Pulmonary embolism, unspecified chronicity, unspecified pulmonary embolism type, unspecified whether acute cor pulmonale present (CMS HCC) I26.99       1. Coronary artery disease:  The patient is status post coronary artery bypass grafting.  He is doing well at this time with no significant symptoms.  He will continue with risk factor modification and medical therapy.    2. History DVT and PE:  The patient continue on Coumadin therapy.  He does deny any bleeding issues at this time.    3. Hypertension:  Patient states his blood pressure has been under good control.  He will continue on his current antihypertensive medications.    4. Hyperlipidemia:  The patient is unable to tolerate statin therapy.  He was denied Repatha.    Return in about 6 months (around 07/05/2019) for np.    Landry Corporal, MD

## 2019-01-15 ENCOUNTER — Other Ambulatory Visit (HOSPITAL_BASED_OUTPATIENT_CLINIC_OR_DEPARTMENT_OTHER): Payer: Self-pay | Admitting: UROLOGY

## 2019-01-15 DIAGNOSIS — N401 Enlarged prostate with lower urinary tract symptoms: Principal | ICD-10-CM

## 2019-01-15 DIAGNOSIS — N138 Other obstructive and reflux uropathy: Secondary | ICD-10-CM

## 2019-01-19 ENCOUNTER — Other Ambulatory Visit (INDEPENDENT_AMBULATORY_CARE_PROVIDER_SITE_OTHER): Payer: Self-pay | Admitting: Family Medicine

## 2019-01-22 ENCOUNTER — Ambulatory Visit: Payer: BC Managed Care – PPO | Attending: UROLOGY | Admitting: UROLOGY

## 2019-01-22 ENCOUNTER — Ambulatory Visit (HOSPITAL_BASED_OUTPATIENT_CLINIC_OR_DEPARTMENT_OTHER): Payer: BC Managed Care – PPO

## 2019-01-22 ENCOUNTER — Encounter (HOSPITAL_BASED_OUTPATIENT_CLINIC_OR_DEPARTMENT_OTHER): Payer: Self-pay | Admitting: UROLOGY

## 2019-01-22 ENCOUNTER — Other Ambulatory Visit: Payer: Self-pay

## 2019-01-22 VITALS — BP 128/62 | Ht 75.0 in | Wt 244.4 lb

## 2019-01-22 DIAGNOSIS — N401 Enlarged prostate with lower urinary tract symptoms: Secondary | ICD-10-CM | POA: Insufficient documentation

## 2019-01-22 DIAGNOSIS — N138 Other obstructive and reflux uropathy: Secondary | ICD-10-CM

## 2019-01-22 DIAGNOSIS — R351 Nocturia: Secondary | ICD-10-CM | POA: Insufficient documentation

## 2019-01-22 LAB — URINALYSIS, MACROSCOPIC
BILIRUBIN: NOT DETECTED mg/dL
BLOOD: NOT DETECTED mg/dL
GLUCOSE: NOT DETECTED mg/dL
KETONES: NOT DETECTED mg/dL
LEUKOCYTES: NOT DETECTED WBCs/uL
NITRITE: NOT DETECTED
PH: 5.5 (ref 5.0–8.0)
PROTEIN: NOT DETECTED mg/dL
SPECIFIC GRAVITY: 1.02 (ref 1.005–1.030)
UROBILINOGEN: 0.2 mg/dL

## 2019-01-22 LAB — PSA, DIAGNOSTIC: PSA: 0.131 ng/mL (ref 0.000–4.000)

## 2019-01-22 NOTE — H&P (Signed)
Urology Clinic, Medical Office Building C  [address]  [place] [address]  [phone]    Date: 01/22/2019  Name: Dustin Webster  Age: 63 y.o.      Chief of Complaint:   Chief Complaint   Patient presents with   . Urinary Symptoms     Patient presents today for history of BPH  with lower urinary symptoms.  Patient states he is doing better since his last visit.  Voices no complaints today       Dustin Webster is a 63 y.o. male  who presents to the practice today for follow-up on his history of BPH.  He reports overall his symptoms are significantly improved after going through [facility] last year.  Nocturia is down to 1 time per night.        Patient Active Problem List    Diagnosis Date Noted   . BPH with obstruction/lower urinary tract symptoms 07/11/2018   . Hypertrophy of prostate 05/23/2018   . Malignant melanoma of right ear (CMS [place]) 05/23/2018   . Diet-controlled diabetes mellitus (CMS [place]) 05/23/2018   . Gastroesophageal reflux disease without esophagitis 05/23/2018   . Chronic depression 05/23/2018   . History of DVT of lower extremity 05/23/2018   . External hemorrhoid 05/23/2018   . Leg cramps, sleep related 05/23/2018   . Insomnia 05/23/2018   . Allergic rhinitis 05/23/2018   . History of open heart surgery 05/23/2018   . Unstable angina (CMS HCC) 05/07/2018   . Chest pain 05/07/2018   . Anemia    . Pulmonary emboli (CMS HCC) 01/14/2017   . Mixed hyperlipidemia 01/14/2017   . HTN (hypertension) 01/16/2016   . Pericardial effusion 01/16/2016   . Benign prostatic hyperplasia 01/02/2016   . Chronic gout 01/02/2016   . CAD (coronary artery disease), native coronary artery 12/26/2015   . Celiac disease 12/26/2015   . VTE (venous thromboembolism) 12/26/2015         Past Medical History:   Diagnosis Date   . Anemia    . Atrial fibrillation (CMS HCC)    . BPH associated with nocturia    . CAD (coronary artery disease)    . Chronic depression 05/23/2018   . CKD (chronic kidney disease)    . Diet-controlled  diabetes mellitus (CMS [place]) 05/23/2018   . DVT (deep venous thrombosis) (CMS HCC)    . Gastroesophageal reflux disease without esophagitis 05/23/2018   . Insomnia 05/23/2018   . Malignant melanoma of right ear (CMS [place]) 05/23/2018   . Pericardial effusion    . Pulmonary emboli (CMS [facility])          Past Surgical History:   Procedure Laterality Date   . HX CHOLECYSTECTOMY     . HX CORONARY ARTERY BYPASS GRAFT     . HX HEART SURGERY  12/2015   . HX HERNIA REPAIR      Inguinal    . HX LIPOMA RESECTION      Chest   . OTHER SURGICAL HISTORY Left 01/2016    Evacuation of hematoma -Left Atrium  at [facility]    . SKIN CANCER EXCISION  06/21/2013    melanoma/ear/[facility]   . TRANSURETHRAL MICROWAVE THERAPY  05/24/2018    Dr Leandro Reasoner         Allergies   Allergen Reactions   . Gluten      Celiac disease     Current Outpatient Medications   Medication Sig Dispense Refill   . allopurinol (  ZYLOPRIM) 100 mg Oral Tablet TAKE 1 TABLET DAILY 90 Tab 3   . amLODIPine (NORVASC) 10 mg Oral Tablet Take 1 Tab (10 mg total) by mouth Once a day 90 Tab 3   . ascorbic acid, vitamin C, (VITAMIN C) 500 mg Oral Tablet 0.5 Tabs Twice daily     . aspirin (ECOTRIN) 81 mg Oral Tablet, Delayed Release (E.C.) Once a day     . Calcium Carbonate (OS-CAL) 650 mg calcium (1,625 mg) Oral Tablet Every evening      . citalopram (CELEXA) 10 mg Oral Tablet Take 1 Tab (10 mg total) by mouth Once a day 90 Tab 1   . famotidine (PEPCID) 20 mg Oral Tablet Take 20 mg by mouth Twice daily     . fenofibrate nanocrystallized (TRICOR) 145 mg Oral Tablet TAKE 1 TABLET DAILY 90 Tab 1   . ferrous sulfate (FERATAB) 324 mg (65 mg iron) Oral Tablet, Delayed Release (E.C.) Take 324 mg by mouth Twice daily     . KRILL OIL ORAL Take by mouth     . LUTEIN ORAL Take by mouth     . melatonin 1 mg Oral Tablet As directed     . multivitamin (CENTRUM SILVER) 400-250 mcg Oral Tablet, Chewable Take 1 Tab by mouth Once a day Taking different brank with no vit K     . nitroGLYCERIN (NITROSTAT) 0.4 mg  Sublingual Tablet, Sublingual 1 Tab (0.4 mg total) by Sublingual route Every 5 minutes as needed for Chest pain for 3 doses over 15 minutes 25 Tab 3   . pantoprazole (PROTONIX) 40 mg Oral Tablet, Delayed Release (E.C.) TAKE 1 TABLET DAILY (Patient not taking: Reported on 01/22/2019) 90 Tab 3   . UBIDECARENONE (COQ-10 ORAL) Once a day     . warfarin (COUMADIN) 10 mg Oral Tablet Take 10 mg by mouth Every evening Sunday Monday, Wednesday, Friday     . Warfarin (COUMADIN) 4 mg Oral Tablet Take 4 mg by mouth Every evening       No current facility-administered medications for this visit.       Family Medical History:     Problem Relation (Age of Onset)    Emphysema Father    Heart Attack General Family Hx    Heart Disease Mother    Hypertension (High Blood Pressure) Mother    Stroke Mother            Social History     Socioeconomic History   . Marital status: Married     Spouse name: Levada Dy   . Number of children: 1   . Years of education: Not on file   . Highest education level: Not on file   Occupational History   . 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 Financial resource strain: Not on file   . Food insecurity     Worry: Not on file     Inability: Not on file   . Transportation needs     Medical: Not on file     Non-medical: Not on file   Tobacco Use   . Smoking status: Never Smoker   . Smokeless tobacco: Never Used   Substance and Sexual Activity   . Alcohol use: Not Currently     Frequency: Monthly or less     Comment: rare   . Drug use: Never   . Sexual activity: Not on file   Lifestyle   .  Physical activity     Days per week: Not on file     Minutes per session: Not on file   . Stress: Not on file   Relationships   . Social Product manager on phone: Not on file     Gets together: Not on file     Attends religious service: Not on file     Active member of club or organization: Not on file     Attends meetings of clubs or organizations: Not on file     Relationship  status: Not on file   . Intimate partner violence     Fear of current or ex partner: Not on file     Emotionally abused: Not on file     Physically abused: Not on file     Forced sexual activity: Not on file   Other Topics Concern   . Not on file   Social History Narrative   . Not on file        ROS: A 10 point review of systems is negative except for the HPI.    OBJECTIVE:   BP 128/62   Ht 1.905 m (6\' 3" )   Wt 110.9 kg (244 lb 6.4 oz)   BMI 30.55 kg/m        Body mass index is 30.55 kg/m.     Exam:    GENERAL: Alert and Oriented, No Apparent Distress  SKIN: Clear  HEENT: NC/AT  NECK: Supple  ABDOMEN: Soft, NT, Non-distended, No Masses  EXTERNAL GENITALIA: Both testicles appear normal with no masses, Penis has no lesions  ANORECTAL EXAM:  Prostate 2+ symmetric nontender no nodules  EXTREMITIES: No clubbing, cyanosis, or edema    Lab Results   Component Value Date/Time    COLOR Yellow 01/22/2019 08:57 AM    SPECGRAVUR 1.020 01/22/2019 08:57 AM    PHURINE 5.5 01/22/2019 08:57 AM    PROTEIN Not Detected 01/22/2019 08:57 AM    GLUCOSE Not Detected 01/22/2019 08:57 AM    KETONES Not Detected 01/22/2019 08:57 AM    UROBILINOGEN 0.2  01/22/2019 08:57 AM    LEUKOCYTES Not Detected 01/22/2019 08:57 AM    NITRITE Not Detected 01/22/2019 08:57 AM    WBC 3.8 09/10/2018 11:11 AM    RBC 4.78 09/10/2018 11:11 AM    BILIRUBIN Not Detected 01/22/2019 08:57 AM    HGBURINE Not Detected 01/22/2019 08:57 AM          ASSESSMENT & PLAN:       ICD-10-CM    1. BPH with obstruction/lower urinary tract symptoms N40.1 PSA, DIAGNOSTIC    N13.8    2. Nocturia R35.1 PSA, DIAGNOSTIC     Return in about 1 year (around 01/22/2020).    We will obtain a PSA today.         This note may have been fully or partially generated using M[employer] Fluency Direct system, and there may be some incorrect words, spellings, and punctuation that were not noted in checking the note before saving.    Graylon Gunning, MD  01/22/2019, 10:05

## 2019-01-25 ENCOUNTER — Other Ambulatory Visit (HOSPITAL_BASED_OUTPATIENT_CLINIC_OR_DEPARTMENT_OTHER): Payer: Self-pay | Admitting: Cardiovascular Disease

## 2019-01-25 ENCOUNTER — Other Ambulatory Visit (INDEPENDENT_AMBULATORY_CARE_PROVIDER_SITE_OTHER): Payer: Self-pay | Admitting: Family Medicine

## 2019-01-30 ENCOUNTER — Encounter (HOSPITAL_BASED_OUTPATIENT_CLINIC_OR_DEPARTMENT_OTHER): Payer: Self-pay | Admitting: Gastroenterology

## 2019-02-01 ENCOUNTER — Other Ambulatory Visit: Payer: Self-pay

## 2019-02-01 ENCOUNTER — Ambulatory Visit (INDEPENDENT_AMBULATORY_CARE_PROVIDER_SITE_OTHER): Payer: BC Managed Care – PPO

## 2019-02-01 DIAGNOSIS — I2699 Other pulmonary embolism without acute cor pulmonale: Secondary | ICD-10-CM

## 2019-02-14 ENCOUNTER — Other Ambulatory Visit (INDEPENDENT_AMBULATORY_CARE_PROVIDER_SITE_OTHER): Payer: Self-pay | Admitting: Family Medicine

## 2019-02-14 DIAGNOSIS — E782 Mixed hyperlipidemia: Secondary | ICD-10-CM

## 2019-02-18 ENCOUNTER — Telehealth (INDEPENDENT_AMBULATORY_CARE_PROVIDER_SITE_OTHER): Payer: Self-pay | Admitting: Family Medicine

## 2019-02-18 ENCOUNTER — Encounter (INDEPENDENT_AMBULATORY_CARE_PROVIDER_SITE_OTHER): Payer: Self-pay | Admitting: Family Medicine

## 2019-02-18 ENCOUNTER — Other Ambulatory Visit: Payer: Self-pay

## 2019-02-18 ENCOUNTER — Other Ambulatory Visit (INDEPENDENT_AMBULATORY_CARE_PROVIDER_SITE_OTHER): Payer: BC Managed Care – PPO

## 2019-02-18 ENCOUNTER — Ambulatory Visit (INDEPENDENT_AMBULATORY_CARE_PROVIDER_SITE_OTHER): Payer: BC Managed Care – PPO | Admitting: Family Medicine

## 2019-02-18 VITALS — BP 122/62 | HR 85 | Temp 97.5°F | Resp 20 | Ht 75.0 in | Wt 246.0 lb

## 2019-02-18 DIAGNOSIS — E782 Mixed hyperlipidemia: Secondary | ICD-10-CM

## 2019-02-18 DIAGNOSIS — Z6830 Body mass index (BMI) 30.0-30.9, adult: Secondary | ICD-10-CM

## 2019-02-18 DIAGNOSIS — I2699 Other pulmonary embolism without acute cor pulmonale: Secondary | ICD-10-CM

## 2019-02-18 DIAGNOSIS — K219 Gastro-esophageal reflux disease without esophagitis: Secondary | ICD-10-CM

## 2019-02-18 DIAGNOSIS — S299XXA Unspecified injury of thorax, initial encounter: Secondary | ICD-10-CM

## 2019-02-18 DIAGNOSIS — K900 Celiac disease: Secondary | ICD-10-CM

## 2019-02-18 DIAGNOSIS — I25110 Atherosclerotic heart disease of native coronary artery with unstable angina pectoris: Secondary | ICD-10-CM

## 2019-02-18 DIAGNOSIS — E119 Type 2 diabetes mellitus without complications: Secondary | ICD-10-CM

## 2019-02-18 DIAGNOSIS — D649 Anemia, unspecified: Secondary | ICD-10-CM

## 2019-02-18 DIAGNOSIS — F32A Depression, unspecified: Secondary | ICD-10-CM

## 2019-02-18 DIAGNOSIS — F329 Major depressive disorder, single episode, unspecified: Secondary | ICD-10-CM

## 2019-02-18 DIAGNOSIS — I10 Essential (primary) hypertension: Secondary | ICD-10-CM

## 2019-02-18 LAB — MANUAL DIFFERENTIAL
BASOPHIL %: 1 % (ref 0–1)
BASOPHIL ABSOLUTE: 0.06 10*3/uL (ref 0.00–0.10)
EOSINOPHIL %: 7 % (ref 0–7)
EOSINOPHIL ABSOLUTE: 0.39 10*3/uL (ref 0.00–0.40)
LYMPHOCYTE %: 30 % (ref 25–40)
LYMPHOCYTE ABSOLUTE: 1.65 10*3/uL (ref 1.00–3.70)
MONOCYTE %: 9 % (ref 0–10)
MONOCYTE ABSOLUTE: 0.5 10*3/uL (ref 0.00–0.70)
NEUTROPHIL %: 53 % (ref 37–81)
NEUTROPHIL ABSOLUTE: 2.92 10*3/uL (ref 1.50–7.00)
PLATELET MORPHOLOGY COMMENT: NORMAL
WBC: 5.5 10*3/uL

## 2019-02-18 LAB — CBC WITH DIFF
HCT: 29.9 % — ABNORMAL LOW (ref 40.7–52.3)
HGB: 9.3 g/dL — ABNORMAL LOW (ref 12.5–16.9)
MCH: 28.2 pg (ref 27.0–33.4)
MCHC: 31.1 g/dL (ref 31.0–37.0)
MCV: 90.6 fL (ref 84.1–99.3)
PLATELETS: 231 10*3/uL (ref 129–381)
RBC: 3.3 10*6/uL — ABNORMAL LOW (ref 4.09–5.89)
RDW: 15.5 % — ABNORMAL HIGH (ref 10.8–15.2)
WBC: 5.5 x10ˆ3/uL (ref 2.6–9.8)

## 2019-02-18 LAB — COMPREHENSIVE METABOLIC PANEL, NON-FASTING
ALBUMIN: 4.4 g/dL (ref 3.5–5.0)
ALBUMIN: 4.4 g/dL (ref 3.5–5.0)
ALKALINE PHOSPHATASE: 43 U/L (ref 38–126)
ALT (SGPT): 29 U/L (ref <=50)
ANION GAP: 9 mmol/L
AST (SGOT): 31 U/L (ref 17–59)
BILIRUBIN TOTAL: 0.2 mg/dL (ref 0.2–5.0)
BUN/CREA RATIO: 17
BUN: 24 mg/dL — ABNORMAL HIGH (ref 9–20)
CALCIUM: 9.9 mg/dL (ref 8.4–10.2)
CHLORIDE: 104 mmol/L (ref 98–107)
CO2 TOTAL: 26 mmol/L (ref 22–30)
CREATININE: 1.41 mg/dL — ABNORMAL HIGH (ref 0.66–1.25)
ESTIMATED GFR: 52 mL/min/1.73mˆ2 — ABNORMAL LOW (ref >=60)
GLUCOSE: 176 mg/dL — ABNORMAL HIGH (ref 74–106)
POTASSIUM: 4.7 mmol/L (ref 3.5–5.1)
PROTEIN TOTAL: 7 g/dL (ref 6.3–8.2)
SODIUM: 139 mmol/L (ref 137–145)

## 2019-02-18 LAB — HGA1C (HEMOGLOBIN A1C WITH EST AVG GLUCOSE)
ESTIMATED AVERAGE GLUCOSE: 137 mg/dL
HEMOGLOBIN A1C: 6.4 % — ABNORMAL HIGH (ref 4.3–6.1)

## 2019-02-18 LAB — THYROID STIMULATING HORMONE WITH FREE T4 REFLEX: TSH: 3.25 u[IU]/mL (ref 0.465–4.680)

## 2019-02-18 LAB — VITAMIN D 25 TOTAL: VITAMIN D 25, TOTAL: 38.7 ng/mL (ref 30.00–100.00)

## 2019-02-18 LAB — MAGNESIUM: MAGNESIUM: 1.8 mg/dL — ABNORMAL LOW (ref 1.9–2.3)

## 2019-02-18 NOTE — Progress Notes (Signed)
PRIMARY CARE, [facility]  [place] [address]    History and Physical     Name: Dustin Webster MRN:  [id number]   Date: 02/18/2019 Age: 63 y.o.           PCP: [name], MD     Reason for Visit: Fall (pt fell at home 3 weeks ago and fell on right side/rib on concrete block.  No lacerations.  No shortness of breath.) and Depression (would like to discuss treatment with celexa.  c/o increase muscle weakness and cramps in legs.  More fatigue as well per pt.)    History of Present Illness  Dustin Webster is a 63 y.o. male who is being seen today for initially acute visit.  Called in this morning.  Had been working in [employer]  Fell on to the corner of concrete block with his right ribs and right arm.  Did not hit head or neck and no loss of consciousness.  Had significant pain in the rib area.  Has not had fevers or chills and no real cough.  Some pain with deep inspiration.  Was improving with ice and rest.  Then began shoveling gravel and acutely worsened again.  On Coumadin for recurrence PE so not taking anything else for pain.  States INR stable 2.9 last check with Cardiology.  Other than his ribs is not having any chest pain or palpitations.  Some shortness of breath with exertion but not worsening and thinks more related to being out of shape.  Had heart catheterization less than a year ago with patent bypass grafts.  Some worsening of dyspepsia but due to renal function is off Protonix.  States Pepcid however works better than Zantac symptoms are improving.  Known celiac disease and is very careful to avoid glutens.  Blood sugars have been fair as well but due for repeat labs.  Blood pressures have been stable and last lipids stable but has had breakfast lunch today.  Does have another visit in 3 weeks would prefer not coming back out due to recent pandemic.  States did see some improvement with Celexa on depression but caused leg cramps.  Had been on Wellbutrin in  the past as well in this cause like creams.  Last magnesium was normal greater than 2. Wonders about changing Celexa has leg cramps or becoming more bothersome at night.  Is drinking plenty of water during the day especially when working outside.  Past Medical History:   Diagnosis Date   . Anemia    . Atrial fibrillation (CMS HCC)    . BPH associated with nocturia    . CAD (coronary artery disease)    . Chronic depression 05/23/2018   . CKD (chronic kidney disease)    . Diet-controlled diabetes mellitus (CMS [place]) 05/23/2018   . DVT (deep venous thrombosis) (CMS HCC)    . Gastroesophageal reflux disease without esophagitis 05/23/2018   . Insomnia 05/23/2018   . Malignant melanoma of right ear (CMS [place]) 05/23/2018   . Pericardial effusion    . Pulmonary emboli (CMS [facility])          Past Surgical History:   Procedure Laterality Date   . HX CHOLECYSTECTOMY     . HX CORONARY ARTERY BYPASS GRAFT     . HX HEART SURGERY  12/2015   . HX HERNIA REPAIR      Inguinal    . HX LIPOMA RESECTION  Chest   . OTHER SURGICAL HISTORY Left 01/2016    Evacuation of hematoma -Left Atrium  at [facility]    . SKIN CANCER EXCISION  06/21/2013    melanoma/ear/[facility]   . TRANSURETHRAL MICROWAVE THERAPY  05/24/2018    Dr Leandro Reasoner         allopurinol (ZYLOPRIM) 100 mg Oral Tablet, TAKE 1 TABLET DAILY  amLODIPine (NORVASC) 10 mg Oral Tablet, Take 1 Tab (10 mg total) by mouth Once a day  ascorbic acid, vitamin C, (VITAMIN C) 500 mg Oral Tablet, 0.5 Tabs Twice daily  aspirin (ECOTRIN) 81 mg Oral Tablet, Delayed Release (E.C.), Once a day  Calcium Carbonate (OS-CAL) 650 mg calcium (1,625 mg) Oral Tablet, Every evening   citalopram (CELEXA) 10 mg Oral Tablet, TAKE 1 TABLET DAILY  famotidine (PEPCID) 20 mg Oral Tablet, Take 20 mg by mouth Twice daily  fenofibrate nanocrystallized (TRICOR) 145 mg Oral Tablet, TAKE 1 TABLET DAILY  ferrous sulfate (FERATAB) 324 mg (65 mg iron) Oral Tablet, Delayed Release (E.C.), Take 324 mg by mouth Twice daily  KRILL OIL ORAL, Take by  mouth  LUTEIN ORAL, Take by mouth  melatonin 1 mg Oral Tablet, As directed  multivitamin (CENTRUM SILVER) 400-250 mcg Oral Tablet, Chewable, Take 1 Tab by mouth Once a day Taking different brank with no vit K  nitroGLYCERIN (NITROSTAT) 0.4 mg Sublingual Tablet, Sublingual, 1 Tab (0.4 mg total) by Sublingual route Every 5 minutes as needed for Chest pain for 3 doses over 15 minutes  UBIDECARENONE (COQ-10 ORAL), Once a day  warfarin (COUMADIN) 10 mg Oral Tablet, Take 10 mg by mouth Every evening Sunday Monday, Wednesday, Friday  Warfarin (COUMADIN) 4 mg Oral Tablet, Take 4 mg by mouth Every evening  pantoprazole (PROTONIX) 40 mg Oral Tablet, Delayed Release (E.C.), TAKE 1 TABLET DAILY (Patient not taking: Reported on 01/22/2019)    No facility-administered medications prior to visit.     Allergies   Allergen Reactions   . Gluten      Celiac disease     Family Medical History:     Problem Relation (Age of Onset)    Emphysema Father    Heart Attack General Family Hx    Heart Disease Mother    Hypertension (High Blood Pressure) Mother    Stroke Mother            Social History     Tobacco Use   . Smoking status: Never Smoker   . Smokeless tobacco: Never Used   Substance Use Topics   . Alcohol use: Not Currently     Frequency: Monthly or less     Comment: rare   . Drug use: Never       Review of Systems  Review of Systems   Constitution: Negative for chills, fever, malaise/fatigue and weight loss.   HENT: Negative for congestion.    Cardiovascular: Positive for dyspnea on exertion. Negative for chest pain.   Respiratory: Negative for cough, sputum production and wheezing.    Musculoskeletal: Positive for stiffness. Negative for neck pain.   Gastrointestinal: Negative for abdominal pain and change in bowel habit.   Genitourinary: Negative for flank pain.        Sees urology for BPH with recent normal PSA   Neurological: Negative for dizziness.   Psychiatric/Behavioral: Positive for depression.       Physical Exam:  BP  122/62 (Site: Right, Patient Position: Sitting, Cuff Size: Adult Large)   Pulse 85   Temp 36.4 C (97.5  F) (Temporal)   Resp 20   Ht 1.905 m (6\' 3" )   Wt 112 kg (246 lb)   SpO2 97%   BMI 30.75 kg/m       Physical Exam   Constitutional: He is oriented to person, place, and time.   Neck: Neck supple. No JVD present. No thyromegaly present.   Cardiovascular: Normal rate and regular rhythm.   No murmur heard.  Pulmonary/Chest: Effort normal and breath sounds normal. He has no wheezes. He has no rales.   No bruising but is tender over the lower right ribs.  No hepatomegaly no step-off deformity or crepitus   Abdominal: Soft. Bowel sounds are normal. He exhibits no mass. There is no abdominal tenderness.   Musculoskeletal:         General: No edema.   Neurological: He is alert and oriented to person, place, and time.   Psychiatric: Affect normal.   Vitals reviewed.      Assessment/Plan:    1. Rib injury  Concern what separation versus fracture.  With Coumadin use and history of previous complications check plain films assure no fracture causing pneumothorax or hemopneumothorax.  Check CBC as well to make sure this remains stable.  States pain controlled enough with Tylenol ice and positioning.  To emergency room for acute shortness of breath worsening pain or other concerning signs and symptoms  - VITAMIN D 25 TOTAL; Future  - XR RIBS RIGHT W PA/AP CHEST; Future    2. Coronary artery disease involving native coronary artery of native heart with unstable angina pectoris (CMS HCC)  Stable continue with Cardiology  - CBC/DIFF; Future    3. Pulmonary embolism, unspecified chronicity, unspecified pulmonary embolism type, unspecified whether acute cor pulmonale present (CMS HCC)  Remain on Coumadin for this and intermittent AFib INR monitor by Cardiology    4. Essential hypertension  Stable monitor labs    5. Mixed hyperlipidemia  Had been stable continue same but not checking labs today due to consuming breakfast and  lunch  - COMPREHENSIVE METABOLIC PANEL, NON-FASTING; Future    6. Diet-controlled diabetes mellitus (CMS [place])  Had been stable continue with gluten free diet and monitor A1c  - HGA1C (HEMOGLOBIN A1C WITH EST AVG GLUCOSE); Future  - THYROID STIMULATING HORMONE WITH FREE T4 REFLEX; Future    7. Gastroesophageal reflux disease without esophagitis  Fair with Pepcid remain off Protonix due to decreased renal function.  Monitor labs today.  - MAGNESIUM; Future    8. Celiac disease  Continue with gluten free diet    9. Depression, unspecified depression type  Some benefits with Celexa but having leg creams.  Check labs 1st and if normal and rt he having same symptoms with an SNRI will consider changing Celexa to newer generation such as fibers are Trintellix if insurance will cover       Orders Placed This Encounter   . XR RIBS RIGHT W PA/AP CHEST   . CBC/DIFF   . COMPREHENSIVE METABOLIC PANEL, NON-FASTING   . MAGNESIUM   . HGA1C (HEMOGLOBIN A1C WITH EST AVG GLUCOSE)   . THYROID STIMULATING HORMONE WITH FREE T4 REFLEX   . VITAMIN D 25 TOTAL              Follow up: Return in about 6 months (around 08/20/2019), or if symptoms worsen or fail to improve.  Seek medical attention for new or worsening symptoms.  Patient has been seen in this clinic within the last 3 years.  [name], MD          This note was partially created using MModal Fluency Direct system (voice recognition software) and is inherently subject to errors including those of syntax and "sound-alike" substitutions which may escape proofreading.  In such instances, original meaning may be extrapolated by contextual derivation.

## 2019-02-18 NOTE — Telephone Encounter (Signed)
States he fell a couple of weeks ago and injured his rib (right side).  States he thought just bruised at first as it started to feel better.  Last Monday he was shoveling gravel and it seems to have worsened significantly.  [place] if needing face to face appt, or may need to come in for appt.

## 2019-02-18 NOTE — Telephone Encounter (Signed)
I do think he should be seen for x ray assure no fracture or pneumothorax etc as he has had complications with lungs in past

## 2019-02-19 ENCOUNTER — Telehealth (INDEPENDENT_AMBULATORY_CARE_PROVIDER_SITE_OTHER): Payer: Self-pay | Admitting: Family Medicine

## 2019-02-19 ENCOUNTER — Other Ambulatory Visit (INDEPENDENT_AMBULATORY_CARE_PROVIDER_SITE_OTHER): Payer: Self-pay

## 2019-02-19 ENCOUNTER — Encounter (INDEPENDENT_AMBULATORY_CARE_PROVIDER_SITE_OTHER): Payer: Self-pay

## 2019-02-19 ENCOUNTER — Other Ambulatory Visit (INDEPENDENT_AMBULATORY_CARE_PROVIDER_SITE_OTHER): Payer: Self-pay | Admitting: Family Medicine

## 2019-02-19 DIAGNOSIS — D649 Anemia, unspecified: Secondary | ICD-10-CM

## 2019-02-19 DIAGNOSIS — S299XXA Unspecified injury of thorax, initial encounter: Secondary | ICD-10-CM

## 2019-02-19 LAB — IRON: IRON: 137 ug/dL (ref 49–181)

## 2019-02-19 LAB — FERRITIN: FERRITIN: 30 ng/mL (ref 18–464)

## 2019-02-19 MED ORDER — PANTOPRAZOLE 40 MG TABLET,DELAYED RELEASE
40.0000 mg | DELAYED_RELEASE_TABLET | Freq: Every day | ORAL | 0 refills | Status: DC
Start: 2019-02-19 — End: 2019-10-29

## 2019-02-19 NOTE — Telephone Encounter (Signed)
See labs 

## 2019-02-19 NOTE — Telephone Encounter (Signed)
I have two bottles of protonics i have kept.  So do not need new prescription. Will start taking them today.

## 2019-02-19 NOTE — Telephone Encounter (Signed)
-----   Message from Ashby Dawes. [place] sent at 02/18/2019  7:30 PM EDT -----  Regarding: Test Results Question  Contact: [phone]  It looks like from my blood test results I have anemia again.  One thing I forgot to tell you at my appointment is I have been experiencing pain/irritation at the bottom of my esophagus/top of my stomach when I swallow food.   I am thinking I may have developed an ulcer.

## 2019-02-19 NOTE — Telephone Encounter (Signed)
-----   Message from [name], MD sent at 02/19/2019  8:08 AM EDT -----  Awaiting CXR/rib films - yes does appear anemic again - A1c just a little higher - continue watching sugars in diet - renal function is stable - with sx would restart Protonix 40 mg daily for 6 weeks - if able add iron and ferritin to these labs - repeat cbc in a week assure stability

## 2019-02-19 NOTE — Telephone Encounter (Signed)
Sent web message to pt; await pharmacy for protonix  02/19/2019  Hillery Aldo, MA

## 2019-02-19 NOTE — Progress Notes (Signed)
Pt aware via web   Orders placed  Pt states he has 2 bottle of Protonix at home  Added Iron and ferritin to existing specimens and cbc order placed for 1 week  [employer], [place]  02/19/2019

## 2019-02-19 NOTE — Progress Notes (Signed)
02/19/2019  Sent web message; await response  Hillery Aldo, MA  .

## 2019-02-20 ENCOUNTER — Encounter (INDEPENDENT_AMBULATORY_CARE_PROVIDER_SITE_OTHER): Payer: Self-pay

## 2019-02-20 NOTE — Progress Notes (Signed)
Pt aware via web message  Hillery Aldo, [place]    02/20/2019

## 2019-02-26 ENCOUNTER — Other Ambulatory Visit (INDEPENDENT_AMBULATORY_CARE_PROVIDER_SITE_OTHER): Payer: BC Managed Care – PPO

## 2019-02-26 ENCOUNTER — Other Ambulatory Visit: Payer: Self-pay

## 2019-02-26 DIAGNOSIS — D649 Anemia, unspecified: Secondary | ICD-10-CM

## 2019-02-26 LAB — CBC WITH DIFF
BASOPHIL #: 0.02 10*3/uL (ref 0.00–0.10)
BASOPHIL %: 1 % (ref 0–1)
EOSINOPHIL #: 0.24 10*3/uL (ref 0.00–0.40)
EOSINOPHIL %: 6 % (ref 0–6)
HCT: 33.6 % — ABNORMAL LOW (ref 40.7–52.3)
HGB: 10.2 g/dL — ABNORMAL LOW (ref 12.5–16.9)
LYMPHOCYTE #: 1.51 10*3/uL (ref 1.00–3.70)
LYMPHOCYTE %: 38 % (ref 14–45)
MCH: 27.9 pg (ref 27.0–33.4)
MCHC: 30.4 g/dL — ABNORMAL LOW (ref 31.0–37.0)
MCV: 92.1 fL (ref 84.1–99.3)
MONOCYTE #: 0.44 10*3/uL (ref 0.00–0.70)
MONOCYTE %: 11 % (ref 0–14)
NEUTROPHIL #: 1.82 10*3/uL (ref 1.50–7.00)
NEUTROPHIL %: 45 % (ref 43–80)
PLATELETS: 257 10*3/uL (ref 129–381)
RBC: 3.65 10*6/uL — ABNORMAL LOW (ref 4.09–5.89)
RDW: 16.6 % — ABNORMAL HIGH (ref 10.8–15.2)
WBC: 4 10*3/uL (ref 2.6–9.8)

## 2019-02-27 ENCOUNTER — Encounter (INDEPENDENT_AMBULATORY_CARE_PROVIDER_SITE_OTHER): Payer: Self-pay

## 2019-02-27 ENCOUNTER — Telehealth (INDEPENDENT_AMBULATORY_CARE_PROVIDER_SITE_OTHER): Payer: Self-pay | Admitting: Family Medicine

## 2019-02-27 DIAGNOSIS — D649 Anemia, unspecified: Secondary | ICD-10-CM

## 2019-02-27 NOTE — Telephone Encounter (Signed)
-----   Message from [name], MD sent at 02/26/2019  9:11 PM EDT -----  Better continue to monitor repeat cbc in a month

## 2019-02-27 NOTE — Progress Notes (Signed)
Pt aware via web message  Dustin Webster, [place]  02/27/2019

## 2019-02-27 NOTE — Telephone Encounter (Signed)
02/27/2019  Pt aware via web message  Dustin Webster, [place]

## 2019-03-05 ENCOUNTER — Encounter (HOSPITAL_BASED_OUTPATIENT_CLINIC_OR_DEPARTMENT_OTHER): Payer: Self-pay

## 2019-03-12 ENCOUNTER — Encounter (INDEPENDENT_AMBULATORY_CARE_PROVIDER_SITE_OTHER): Payer: Self-pay | Admitting: Family Medicine

## 2019-03-12 ENCOUNTER — Encounter (INDEPENDENT_AMBULATORY_CARE_PROVIDER_SITE_OTHER): Payer: Self-pay

## 2019-03-13 ENCOUNTER — Ambulatory Visit (INDEPENDENT_AMBULATORY_CARE_PROVIDER_SITE_OTHER): Payer: BC Managed Care – PPO

## 2019-03-13 ENCOUNTER — Other Ambulatory Visit: Payer: Self-pay

## 2019-03-13 ENCOUNTER — Other Ambulatory Visit (HOSPITAL_BASED_OUTPATIENT_CLINIC_OR_DEPARTMENT_OTHER): Payer: Self-pay | Admitting: Cardiovascular Disease

## 2019-03-13 DIAGNOSIS — I2699 Other pulmonary embolism without acute cor pulmonale: Secondary | ICD-10-CM

## 2019-03-13 MED ORDER — WARFARIN 4 MG TABLET
ORAL_TABLET | ORAL | 2 refills | Status: DC
Start: 2019-03-13 — End: 2019-03-15

## 2019-03-13 MED ORDER — WARFARIN 10 MG TABLET
ORAL_TABLET | ORAL | 2 refills | Status: DC
Start: 2019-03-13 — End: 2019-03-15

## 2019-03-15 ENCOUNTER — Other Ambulatory Visit (HOSPITAL_BASED_OUTPATIENT_CLINIC_OR_DEPARTMENT_OTHER): Payer: Self-pay | Admitting: Cardiovascular Disease

## 2019-03-15 MED ORDER — WARFARIN 10 MG TABLET
ORAL_TABLET | ORAL | 3 refills | Status: DC
Start: 2019-03-15 — End: 2019-11-25

## 2019-03-15 MED ORDER — WARFARIN 4 MG TABLET
ORAL_TABLET | ORAL | 3 refills | Status: DC
Start: 2019-03-15 — End: 2019-11-25

## 2019-03-28 ENCOUNTER — Ambulatory Visit (INDEPENDENT_AMBULATORY_CARE_PROVIDER_SITE_OTHER): Payer: BC Managed Care – PPO

## 2019-03-28 ENCOUNTER — Other Ambulatory Visit (INDEPENDENT_AMBULATORY_CARE_PROVIDER_SITE_OTHER): Payer: BC Managed Care – PPO

## 2019-03-28 ENCOUNTER — Other Ambulatory Visit: Payer: Self-pay

## 2019-03-28 DIAGNOSIS — D649 Anemia, unspecified: Secondary | ICD-10-CM

## 2019-03-28 DIAGNOSIS — I2699 Other pulmonary embolism without acute cor pulmonale: Principal | ICD-10-CM

## 2019-03-28 LAB — CBC WITH DIFF
BASOPHIL #: 0.08 10*3/uL (ref 0.00–0.10)
BASOPHIL %: 2 % — ABNORMAL HIGH (ref 0–1)
EOSINOPHIL #: 0.36 10*3/uL (ref 0.00–0.40)
EOSINOPHIL %: 8 % — ABNORMAL HIGH (ref 0–6)
HCT: 41.1 % (ref 40.7–52.3)
HGB: 13 g/dL (ref 12.5–16.9)
LYMPHOCYTE #: 1.59 10*3/uL (ref 1.00–3.70)
LYMPHOCYTE %: 36 % (ref 14–45)
MCH: 28.4 pg (ref 27.0–33.4)
MCHC: 31.6 g/dL (ref 31.0–37.0)
MCV: 89.9 fL (ref 84.1–99.3)
MONOCYTE #: 0.57 10*3/uL (ref 0.00–0.70)
MONOCYTE %: 13 % (ref 0–14)
NEUTROPHIL #: 1.78 10*3/uL (ref 1.50–7.00)
NEUTROPHIL %: 41 % — ABNORMAL LOW (ref 43–80)
PLATELETS: 243 10*3/uL (ref 129–381)
RBC: 4.57 10*6/uL (ref 4.09–5.89)
RDW: 14.4 % (ref 10.8–15.2)
WBC: 4.4 10*3/uL (ref 2.6–9.8)

## 2019-03-29 ENCOUNTER — Encounter (INDEPENDENT_AMBULATORY_CARE_PROVIDER_SITE_OTHER): Payer: Self-pay

## 2019-03-29 NOTE — Progress Notes (Signed)
03/29/2019  Pt aware via web message  Dustin Webster, [place]

## 2019-04-24 ENCOUNTER — Other Ambulatory Visit: Payer: Self-pay

## 2019-04-24 ENCOUNTER — Ambulatory Visit (INDEPENDENT_AMBULATORY_CARE_PROVIDER_SITE_OTHER): Payer: BC Managed Care – PPO

## 2019-04-24 DIAGNOSIS — I2699 Other pulmonary embolism without acute cor pulmonale: Secondary | ICD-10-CM

## 2019-05-24 ENCOUNTER — Other Ambulatory Visit: Payer: Self-pay

## 2019-05-27 ENCOUNTER — Other Ambulatory Visit: Payer: Self-pay

## 2019-05-27 ENCOUNTER — Ambulatory Visit (INDEPENDENT_AMBULATORY_CARE_PROVIDER_SITE_OTHER): Payer: BC Managed Care – PPO

## 2019-05-27 DIAGNOSIS — I3139 Other pericardial effusion (noninflammatory): Secondary | ICD-10-CM

## 2019-05-27 DIAGNOSIS — I313 Pericardial effusion (noninflammatory): Secondary | ICD-10-CM

## 2019-06-27 ENCOUNTER — Other Ambulatory Visit: Payer: Self-pay

## 2019-06-27 ENCOUNTER — Ambulatory Visit (INDEPENDENT_AMBULATORY_CARE_PROVIDER_SITE_OTHER): Payer: BC Managed Care – PPO

## 2019-06-27 DIAGNOSIS — Z86718 Personal history of other venous thrombosis and embolism: Secondary | ICD-10-CM

## 2019-07-05 ENCOUNTER — Encounter (HOSPITAL_BASED_OUTPATIENT_CLINIC_OR_DEPARTMENT_OTHER): Payer: Self-pay | Admitting: Nurse Practitioner

## 2019-07-30 ENCOUNTER — Encounter (HOSPITAL_BASED_OUTPATIENT_CLINIC_OR_DEPARTMENT_OTHER): Payer: Self-pay | Admitting: Nurse Practitioner

## 2019-07-31 ENCOUNTER — Encounter (HOSPITAL_BASED_OUTPATIENT_CLINIC_OR_DEPARTMENT_OTHER): Payer: Self-pay | Admitting: Nurse Practitioner

## 2019-08-14 ENCOUNTER — Other Ambulatory Visit: Payer: Self-pay

## 2019-08-14 ENCOUNTER — Ambulatory Visit (INDEPENDENT_AMBULATORY_CARE_PROVIDER_SITE_OTHER): Payer: BC Managed Care – PPO | Admitting: Nurse Practitioner

## 2019-08-14 ENCOUNTER — Encounter (HOSPITAL_BASED_OUTPATIENT_CLINIC_OR_DEPARTMENT_OTHER): Payer: Self-pay | Admitting: Nurse Practitioner

## 2019-08-14 VITALS — BP 126/88 | HR 70 | Ht 74.5 in | Wt 239.0 lb

## 2019-08-14 DIAGNOSIS — R55 Syncope and collapse: Secondary | ICD-10-CM

## 2019-08-14 DIAGNOSIS — I2699 Other pulmonary embolism without acute cor pulmonale: Secondary | ICD-10-CM

## 2019-08-14 DIAGNOSIS — Z951 Presence of aortocoronary bypass graft: Secondary | ICD-10-CM

## 2019-08-14 DIAGNOSIS — R5383 Other fatigue: Secondary | ICD-10-CM

## 2019-08-14 DIAGNOSIS — I4891 Unspecified atrial fibrillation: Secondary | ICD-10-CM

## 2019-08-14 DIAGNOSIS — I25811 Atherosclerosis of native coronary artery of transplanted heart without angina pectoris: Secondary | ICD-10-CM

## 2019-08-14 DIAGNOSIS — Z86718 Personal history of other venous thrombosis and embolism: Secondary | ICD-10-CM

## 2019-08-14 DIAGNOSIS — I10 Essential (primary) hypertension: Secondary | ICD-10-CM

## 2019-08-14 NOTE — Patient Instructions (Addendum)
Avoid caffeine    If increased dizziness call office to arrange monitor      Carotid ultrasound    NTG as needed for chest pain    KARDIA DEVICE

## 2019-08-14 NOTE — Progress Notes (Signed)
[address]  [address], [place]  [place] [address]  [phone]    Date:   08/14/2019  Name: Dustin Webster  Age: 63 y.o.    REASON FOR VISIT:   Follow Up 6 Months    HISTORY OF PRESENTING ILLNESS:    Dustin Webster is a 63 y.o. 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  The patient was last evaluated in the office in February at which time he was stable from a cardiovascular standpoint.  Today he admits to some periods of "dizziness" which occur both with increased activity and with changing positions from bending to standing straight.  He denies any chest pain similar to his prior anginal symptoms, palpitations, shortness of breath, syncope or near-syncope.  He was unaware of his postoperative atrial fibrillation when it occurred.  He does admit to some mild "twinges" in the left breast area which he states has been present since he was a teenager and was attributing to perhaps scar tissue when wrestling with his siblings.  He does admit that his dizziness does increase with increased caffeine intake.  He remains on Coumadin without any bleeding complications.    Current Outpatient Medications   Medication Sig   . allopurinol (ZYLOPRIM) 100 mg Oral Tablet TAKE 1 TABLET DAILY   . amLODIPine (NORVASC) 10 mg Oral Tablet Take 1 Tab (10 mg total) by mouth Once a day   . ascorbic acid, vitamin C, (VITAMIN C) 500 mg Oral Tablet 0.5 Tabs Twice daily   . aspirin (ECOTRIN) 81 mg Oral Tablet, Delayed Release (E.C.) Once a day   . Calcium Carbonate (OS-CAL) 650 mg calcium (1,625 mg) Oral Tablet Every evening    . citalopram (CELEXA) 10 mg Oral Tablet TAKE 1 TABLET DAILY   . famotidine (PEPCID) 20 mg Oral Tablet Take 20 mg by mouth Twice daily   . fenofibrate nanocrystallized (TRICOR) 145 mg Oral Tablet TAKE 1 TABLET DAILY   .  ferrous sulfate (FERATAB) 324 mg (65 mg iron) Oral Tablet, Delayed Release (E.C.) Take 324 mg by mouth Twice daily   . KRILL OIL ORAL Take by mouth   . LUTEIN ORAL Take by mouth   . Magnesium 250 mg Oral Tablet Take by mouth Twice daily   . melatonin 1 mg Oral Tablet As directed   . multivitamin (CENTRUM SILVER) 400-250 mcg Oral Tablet, Chewable Take 1 Tab by mouth Once a day Taking different brank with no vit K   . nitroGLYCERIN (NITROSTAT) 0.4 mg Sublingual Tablet, Sublingual 1 Tab (0.4 mg total) by Sublingual route Every 5 minutes as needed for Chest pain for 3 doses over 15 minutes   . pantoprazole (PROTONIX) 40 mg Oral Tablet, Delayed Release (E.C.) Take 1 Tab (40 mg total) by mouth Once a day (Patient not taking: Reported on 08/14/2019)   . UBIDECARENONE (COQ-10 ORAL) Once a day   . warfarin (COUMADIN) 10 mg Oral Tablet Daily or as directed   . Warfarin (COUMADIN) 4 mg Oral Tablet Daily or as directed     Allergies   Allergen Reactions   . Gluten      Celiac disease     Past Medical History:   Diagnosis Date   . Anemia    . Atrial fibrillation (CMS HCC)    . BPH associated with nocturia    . CAD (coronary artery disease)    .  Chronic depression 05/23/2018   . CKD (chronic kidney disease)    . Diet-controlled diabetes mellitus (CMS [place]) 05/23/2018   . DVT (deep venous thrombosis) (CMS HCC)    . Gastroesophageal reflux disease without esophagitis 05/23/2018   . Insomnia 05/23/2018   . Malignant melanoma of right ear (CMS [place]) 05/23/2018   . Pericardial effusion    . Pulmonary emboli (CMS [facility])          Past Surgical History:   Procedure Laterality Date   . HX CHOLECYSTECTOMY     . HX CORONARY ARTERY BYPASS GRAFT     . HX HEART SURGERY  12/2015   . HX HERNIA REPAIR      Inguinal    . HX LIPOMA RESECTION      Chest   . OTHER SURGICAL HISTORY Left 01/2016    Evacuation of hematoma -Left Atrium  at [facility]    . SKIN CANCER EXCISION  06/21/2013    melanoma/ear/[facility]   . TRANSURETHRAL MICROWAVE THERAPY  05/24/2018    Dr Leandro Reasoner          Family Medical History:     Problem Relation (Age of Onset)    Emphysema Father    Heart Attack General Family Hx    Heart Disease Mother    Hypertension (High Blood Pressure) Mother    Stroke Mother            Social History     Socioeconomic History   . Marital status: Married     Spouse name: Levada Dy   . Number of children: 1   . Years of education: Not on file   . Highest education level: Not on file   Occupational History   . Occupation: Other Land     Comment: Retired   Tobacco Use   . Smoking status: Never Smoker   . Smokeless tobacco: Never Used   Substance and Sexual Activity   . Alcohol use: Not Currently     Frequency: Monthly or less     Comment: rare   . Drug use: Never       REVIEW OF SYSTEMS:  Constitutional: No fevers, chills, malaise or abnormal weight loss.    HENT: No tinnitus, vertigo, hearing loss, ear pain, sinus drainage, nasal congestion or throat pain.  Eyes: No vision changes, diplopia, drainage, pain  Respiratory: No cough, shortness of breath, wheezing  Cardiovascular: No chest pain, PND, orthopnea, palpitations, lower extremity edema.    Gastrointestinal: No abdominal pain, nausea, vomiting, diarrhea, constipation, melana or hematochezia   Endocrine: No polyuria, polydipsia, heat or cold intolerance.  Genitourinary: No dysuria, urgency, frequency, hematuria, nocturia.  Musculoskeletal: No myalgias, arthralgias.  Skin: No rashes, suspicious lesions.  Neurological: No headaches, weakness, paresthesias  Hematological:  No bleeding, spontaneous bruising.  Psychiatric/Behavioral: No confusion, agitation, hallucinations, paranoia, delusions.    All other systems reviewed and are negative except as noted in [place].    PHYSICAL EXAMINATION:     Vitals reviewed. BP 126/88 Comment: RTA  Pulse 70   Ht 1.892 m (6' 2.5")   Wt 108 kg (239 lb)   SpO2 98%   BMI 30.28 kg/m       Vitals Filed       08/14/2019  1300 08/14/2019  1301          BP:  124/82  126/88      Pulse:  70  --             Constitutional: He  is oriented to person, place, and time.  He appears well-developed and well-nourished.   HEENT:  Normocephalic, atraumatic.  TM's clear and intact bilaterally.  Canals clear.  Nose clear with drainage.  Normal appearing nasal mucosa.  Posterior pharynx clear without lesions or exudate. Mucous membranes moist without lesions.   Neck: Supple. Thyroid normal without enlargement or palpable nodules.  Trachea midline.  Cardiovascular: Normal rate and rhythm without murmurs, rubs or gallops.  No JVD.  No bruits.  No peripheral edema is noted.  Pulmonary/Chest: Lungs clear without wheezes, rales or rhonchi.  No chest wall tenderness or deformity.   Abdominal: Soft. Nontender, nondistended with normal bowel sounds in all quadrants. No hepatosplenomegaly. No guarding, rebound , abnormal masses.  Rectal exam deferred.  Musculoskeletal: Full and painless ROM in all joint groups.  Neurological: Alert and oriented x 4.  Cranial nerves 2-12 intact without defecits.  BUE's/BLE's.  Strength and sensation intact in all extremeties.   Skin: Skin is warm and dry. No rash or suspicious skin lesions noted.  Psychiatric:  Normal mood and affect. Speech is normal and behavior is normal. Judgment and thought content normal. Cognition and memory are normal.  Vascular: Peripheral lower extremity pulses well felt.    ASSESSMENT:    ENCOUNTER DIAGNOSES     ICD-10-CM   1. Near syncope  R55   2. Coronary artery disease involving native artery of transplanted heart without angina pectoris  I25.811   3. Atrial fibrillation, unspecified type (CMS HCC)  I48.91   4. Essential hypertension  I10   5. Pulmonary embolism, unspecified chronicity, unspecified pulmonary embolism type, unspecified whether acute cor pulmonale present (CMS HCC)  I26.99   6. History of DVT of lower extremity  Z86.718   7. Fatigue, unspecified type  R53.83   8. Hx of CABG  Z95.1         PLAN:  Dustin Webster is a 63 year old white male with history of CAD  prior CABG, history of postoperative paroxysmal AFib, history of DVT, PE, hypertension and hyperlipidemia.    CAD-no current anginal symptoms.  Patient advised to continue current medications and use nitroglycerin as needed for recurrent anginal symptoms.    AFib-no reported palpitations although discussed with the patient that he likely may be having arrhythmia creating his dizziness.  He was advised if dizziness continues to contact office at which time a monitor could be considered.    History of DVT/PE-continue Coumadin.  Report any signs or symptoms of bleeding to the office    Dizziness/near syncope-echocardiogram 1 year ago revealed no significant valvular dysfunction.  Will obtain a carotid ultrasound to evaluate for obstructive carotid disease contributing to the patient's symptoms.  Patient advised to monitor blood pressure when dizziness occurs as well as heart rate and record.  Patient advised to avoid caffeine as his dizziness does increase with increased caffeine intake    Orders Placed This Encounter   . CAROTID ARTERY DUPLEX- PCA ONLY       Return in about 6 months (around 02/11/2020) for Dr Cornelia Copa.    Electronically signed by     Cristal Deer, APRN  08/14/2019, 14:33  Note reviewed and I agree with the above findings and assessment and plan by the nurse practitioner.

## 2019-08-17 ENCOUNTER — Other Ambulatory Visit (HOSPITAL_BASED_OUTPATIENT_CLINIC_OR_DEPARTMENT_OTHER): Payer: Self-pay | Admitting: Cardiovascular Disease

## 2019-08-17 ENCOUNTER — Other Ambulatory Visit (INDEPENDENT_AMBULATORY_CARE_PROVIDER_SITE_OTHER): Payer: Self-pay | Admitting: Family Medicine

## 2019-08-17 DIAGNOSIS — M1A9XX0 Chronic gout, unspecified, without tophus (tophi): Secondary | ICD-10-CM

## 2019-08-19 ENCOUNTER — Encounter (INDEPENDENT_AMBULATORY_CARE_PROVIDER_SITE_OTHER): Payer: Self-pay | Admitting: Family Medicine

## 2019-08-19 NOTE — Telephone Encounter (Signed)
Last scheduled appointment with you was 02/18/2019.  Currently scheduled future appointment is 08/28/2019.    Patient has been seen within the last year: Yes.    I updated med list and Rx or Rx's are ready to be sent to  [facility] for the pt.      Clint Bolder, MA  08/19/2019, 07:41

## 2019-08-25 ENCOUNTER — Encounter (INDEPENDENT_AMBULATORY_CARE_PROVIDER_SITE_OTHER): Payer: Self-pay | Admitting: Family Medicine

## 2019-08-26 ENCOUNTER — Other Ambulatory Visit (INDEPENDENT_AMBULATORY_CARE_PROVIDER_SITE_OTHER): Payer: Self-pay | Admitting: Family Medicine

## 2019-08-26 DIAGNOSIS — I10 Essential (primary) hypertension: Secondary | ICD-10-CM

## 2019-08-26 DIAGNOSIS — E782 Mixed hyperlipidemia: Secondary | ICD-10-CM

## 2019-08-26 DIAGNOSIS — E119 Type 2 diabetes mellitus without complications: Secondary | ICD-10-CM

## 2019-08-26 DIAGNOSIS — I25110 Atherosclerotic heart disease of native coronary artery with unstable angina pectoris: Secondary | ICD-10-CM

## 2019-08-26 DIAGNOSIS — M1A9XX0 Chronic gout, unspecified, without tophus (tophi): Secondary | ICD-10-CM

## 2019-08-26 DIAGNOSIS — I2699 Other pulmonary embolism without acute cor pulmonale: Secondary | ICD-10-CM

## 2019-08-26 DIAGNOSIS — D649 Anemia, unspecified: Secondary | ICD-10-CM

## 2019-08-27 ENCOUNTER — Other Ambulatory Visit (INDEPENDENT_AMBULATORY_CARE_PROVIDER_SITE_OTHER): Payer: BC Managed Care – PPO

## 2019-08-27 ENCOUNTER — Other Ambulatory Visit: Payer: Self-pay

## 2019-08-27 ENCOUNTER — Other Ambulatory Visit (INDEPENDENT_AMBULATORY_CARE_PROVIDER_SITE_OTHER): Payer: Self-pay | Admitting: Family Medicine

## 2019-08-27 DIAGNOSIS — I2699 Other pulmonary embolism without acute cor pulmonale: Secondary | ICD-10-CM

## 2019-08-27 DIAGNOSIS — E782 Mixed hyperlipidemia: Secondary | ICD-10-CM

## 2019-08-27 DIAGNOSIS — E119 Type 2 diabetes mellitus without complications: Secondary | ICD-10-CM

## 2019-08-27 DIAGNOSIS — D649 Anemia, unspecified: Secondary | ICD-10-CM

## 2019-08-27 LAB — CBC WITH DIFF
BASOPHIL #: 0.08 10*3/uL (ref 0.00–0.10)
BASOPHIL %: 2 % — ABNORMAL HIGH (ref 0–1)
EOSINOPHIL #: 0.3 10*3/uL (ref 0.00–0.40)
EOSINOPHIL %: 6 % (ref 0–6)
HCT: 42.6 % (ref 40.7–52.3)
HGB: 13.8 g/dL (ref 12.5–16.9)
LYMPHOCYTE #: 2.14 10*3/uL (ref 1.00–3.70)
LYMPHOCYTE %: 43 % (ref 14–45)
MCH: 28.2 pg (ref 27.0–33.4)
MCHC: 32.4 g/dL (ref 31.0–37.0)
MCV: 87.1 fL (ref 84.1–99.3)
MONOCYTE #: 0.51 10*3/uL (ref 0.00–0.70)
MONOCYTE %: 10 % (ref 0–14)
NEUTROPHIL #: 1.94 10*3/uL (ref 1.50–7.00)
NEUTROPHIL %: 39 % — ABNORMAL LOW (ref 43–80)
PLATELETS: 223 10*3/uL (ref 129–381)
RBC: 4.89 10*6/uL (ref 4.09–5.89)
RDW: 14.2 % (ref 10.8–15.2)
WBC: 5 10*3/uL (ref 2.6–9.8)

## 2019-08-27 LAB — COMPREHENSIVE METABOLIC PNL, FASTING
ALBUMIN: 4.8 g/dL (ref 3.5–5.0)
ALKALINE PHOSPHATASE: 45 U/L (ref 38–126)
ALT (SGPT): 44 U/L (ref <=50)
ANION GAP: 9 mmol/L
AST (SGOT): 34 U/L (ref 17–59)
BILIRUBIN TOTAL: 0.3 mg/dL (ref 0.2–5.0)
BUN/CREA RATIO: 17
BUN: 25 mg/dL — ABNORMAL HIGH (ref 9–20)
CALCIUM: 9.9 mg/dL (ref 8.4–10.2)
CHLORIDE: 106 mmol/L (ref 98–107)
CO2 TOTAL: 24 mmol/L (ref 22–30)
CREATININE: 1.49 mg/dL — ABNORMAL HIGH (ref 0.66–1.25)
ESTIMATED GFR: 49 mL/min/{1.73_m2} — ABNORMAL LOW (ref >=60)
GLUCOSE: 141 mg/dL — ABNORMAL HIGH (ref 74–106)
POTASSIUM: 4.5 mmol/L (ref 3.5–5.1)
PROTEIN TOTAL: 7.5 g/dL (ref 6.3–8.2)
SODIUM: 139 mmol/L (ref 137–145)

## 2019-08-27 LAB — THYROXINE, FREE (FREE T4): THYROXINE (T4), FREE: 0.94 ng/dL (ref 0.78–2.19)

## 2019-08-27 LAB — LIPID PANEL
CHOLESTEROL: 178 mg/dL (ref 0–199)
HDL CHOL: 37 mg/dL — ABNORMAL LOW (ref 40–60)
LDL CALC: 94 mg/dL (ref 0–130)
TRIGLYCERIDES: 233 mg/dL — ABNORMAL HIGH (ref 0–149)
VLDL CALC: 47 mg/dL

## 2019-08-27 LAB — HGA1C (HEMOGLOBIN A1C WITH EST AVG GLUCOSE)
ESTIMATED AVERAGE GLUCOSE: 146 mg/dL
HEMOGLOBIN A1C: 6.7 % — ABNORMAL HIGH (ref 4.3–6.1)

## 2019-08-27 LAB — THYROID STIMULATING HORMONE (SENSITIVE TSH): TSH: 3.29 u[IU]/mL (ref 0.465–4.680)

## 2019-08-27 LAB — FERRITIN: FERRITIN: 37 ng/mL (ref 18–464)

## 2019-08-27 LAB — IRON: IRON: 65 ug/dL (ref 49–181)

## 2019-08-27 LAB — MAGNESIUM: MAGNESIUM: 2 mg/dL (ref 1.9–2.3)

## 2019-08-27 LAB — VITAMIN D 25 TOTAL: VITAMIN D 25, TOTAL: 42.1 ng/mL (ref 30.00–100.00)

## 2019-08-27 NOTE — Ancillary Notes (Signed)
Department of Community Practice     Venipuncture performed in office on left  arm antecubital vein, dry pressure dressing was applied to site and patient tolerated it well.  Specimen was centrifuged, aliquoted as needed and specimen was labeled and packaged for transport.    Leeann Must  08/27/2019, 08:58

## 2019-08-28 ENCOUNTER — Encounter (INDEPENDENT_AMBULATORY_CARE_PROVIDER_SITE_OTHER): Payer: BC Managed Care – PPO | Admitting: Family Medicine

## 2019-08-29 ENCOUNTER — Encounter (INDEPENDENT_AMBULATORY_CARE_PROVIDER_SITE_OTHER): Payer: Self-pay

## 2019-08-29 NOTE — Progress Notes (Signed)
08/29/2019  Pt aware via [place], [place]

## 2019-08-30 ENCOUNTER — Ambulatory Visit
Admission: RE | Admit: 2019-08-30 | Discharge: 2019-08-30 | Disposition: A | Discharge status: Home / Self Care | Payer: BC Managed Care – PPO | Source: Ambulatory Visit | Attending: Internal Medicine | Admitting: Internal Medicine

## 2019-08-30 ENCOUNTER — Other Ambulatory Visit: Payer: Self-pay

## 2019-08-30 DIAGNOSIS — R55 Syncope and collapse: Secondary | ICD-10-CM

## 2019-09-03 ENCOUNTER — Ambulatory Visit (INDEPENDENT_AMBULATORY_CARE_PROVIDER_SITE_OTHER): Payer: BC Managed Care – PPO | Admitting: Family Medicine

## 2019-09-03 ENCOUNTER — Ambulatory Visit (HOSPITAL_BASED_OUTPATIENT_CLINIC_OR_DEPARTMENT_OTHER): Payer: Self-pay

## 2019-09-03 ENCOUNTER — Encounter (INDEPENDENT_AMBULATORY_CARE_PROVIDER_SITE_OTHER): Payer: Self-pay | Admitting: Family Medicine

## 2019-09-03 ENCOUNTER — Other Ambulatory Visit: Payer: Self-pay

## 2019-09-03 VITALS — BP 130/78 | HR 73 | Temp 96.6°F | Resp 18 | Ht 62.5 in | Wt 236.0 lb

## 2019-09-03 DIAGNOSIS — R194 Change in bowel habit: Secondary | ICD-10-CM

## 2019-09-03 DIAGNOSIS — I2699 Other pulmonary embolism without acute cor pulmonale: Secondary | ICD-10-CM

## 2019-09-03 DIAGNOSIS — R42 Dizziness and giddiness: Secondary | ICD-10-CM

## 2019-09-03 DIAGNOSIS — D649 Anemia, unspecified: Secondary | ICD-10-CM

## 2019-09-03 DIAGNOSIS — I25110 Atherosclerotic heart disease of native coronary artery with unstable angina pectoris: Secondary | ICD-10-CM

## 2019-09-03 DIAGNOSIS — E119 Type 2 diabetes mellitus without complications: Secondary | ICD-10-CM

## 2019-09-03 DIAGNOSIS — I10 Essential (primary) hypertension: Secondary | ICD-10-CM

## 2019-09-03 DIAGNOSIS — E782 Mixed hyperlipidemia: Secondary | ICD-10-CM

## 2019-09-03 MED ORDER — CHOLESTYRAMINE (WITH SUGAR) 4 GRAM POWDER FOR SUSP IN A PACKET
1.0000 | Freq: Every day | ORAL | 2 refills | Status: DC
Start: 2019-09-03 — End: 2019-09-26

## 2019-09-03 NOTE — Telephone Encounter (Signed)
Patient called stating he is continuing to experience dizziness and would like to wear cardiac monitor X 1 week. Scheduling to contact patient. Dionisio Paschal, RN  09/03/2019, 14:52

## 2019-09-06 ENCOUNTER — Ambulatory Visit (HOSPITAL_BASED_OUTPATIENT_CLINIC_OR_DEPARTMENT_OTHER): Payer: Self-pay | Admitting: Nurse Practitioner

## 2019-09-06 NOTE — Telephone Encounter (Signed)
Informed patient carotid ultrasound showed significant stenosis. Patient verbalized understanding.  Francis Gaines, LPN  20/80/2233, 61:22

## 2019-09-08 NOTE — Progress Notes (Signed)
PRIMARY CARE, [facility]  [place] [address]    History and Physical     Name: Dustin Webster MRN:  [id number]   Date: 09/03/2019 Age: 63 y.o.           PCP: [name], MD     Reason for Visit: Follow Up 6 Months; Diabetes Follow up (Diet controlled.  Last A1C 6.7 last week.); Anemia (Stable with recent hcb last week (13.8)  as well as Iron and ferritin levels.); Heart Disease (Follows with cardiology.  REcently seen and states getting worked up for Atrial fib.); and Dizziness (Has had increase in the last few months.  Does not take antihistamin.)    History of Present Illness  Dustin Webster is a 63 y.o. male who is being seen today for routine follow-up.  Other than dizziness above states he is doing well.  Continues overall to follow his diet A1c remains fairly stable.  Does have regular eye exams.  With celiac disease does watch his diet.    Anemia remains stable as well.  Continues his iron.  These labs stable also.  No melena or hematochezia.  However has been being more diarrhea more difficult to control.  Has some cramping with this as well.  Again with celiac disease is very careful about what he eats so not getting any gluten he is aware of.  Has had colonoscopy but has been years ago.    Has been having increasing dizziness.  Follows with Cardiology for coronary artery disease, see previous complications with per cardial effusion from bleeding into the sac after surgery etcetera.  Has done well with this blood pressure remains stable.  He denies any chest pain or palpitations but again having this occasional dizziness.  States it is much more lightheaded and not vertiginous.  Has had workup so far which is negative but awaiting carotid ultrasound.  Has not had any falls.  Chronic kidney disease has remained stable as well.  Does remain on Coumadin for this as well as recurrent pulmonary emboli and he has not had any bleeding complications and his reflux has  remained controlled.    Past Medical History:   Diagnosis Date   . Anemia    . Atrial fibrillation (CMS HCC)    . BPH associated with nocturia    . CAD (coronary artery disease)    . Chronic depression 05/23/2018   . CKD (chronic kidney disease)    . Diet-controlled diabetes mellitus (CMS [place]) 05/23/2018   . DVT (deep venous thrombosis) (CMS HCC)    . Gastroesophageal reflux disease without esophagitis 05/23/2018   . Insomnia 05/23/2018   . Malignant melanoma of right ear (CMS [place]) 05/23/2018   . Pericardial effusion    . Pulmonary emboli (CMS [facility])          Past Surgical History:   Procedure Laterality Date   . HX CHOLECYSTECTOMY     . HX CORONARY ARTERY BYPASS GRAFT     . HX HEART SURGERY  12/2015   . HX HERNIA REPAIR      Inguinal    . HX LIPOMA RESECTION      Chest   . OTHER SURGICAL HISTORY Left 01/2016    Evacuation of hematoma -Left Atrium  at [facility]    . SKIN CANCER EXCISION  06/21/2013    melanoma/ear/[facility]   . TRANSURETHRAL MICROWAVE THERAPY  05/24/2018    Dr Leandro Reasoner  allopurinoL (ZYLOPRIM) 100 mg Oral Tablet, TAKE 1 TABLET DAILY  amLODIPine (NORVASC) 10 mg Oral Tablet, TAKE 1 TABLET DAILY  ascorbic acid, vitamin C, (VITAMIN C) 500 mg Oral Tablet, 0.5 Tabs Twice daily  aspirin (ECOTRIN) 81 mg Oral Tablet, Delayed Release (E.C.), Once a day  Calcium Carbonate (OS-CAL) 650 mg calcium (1,625 mg) Oral Tablet, Every evening   citalopram (CELEXA) 10 mg Oral Tablet, TAKE 1 TABLET DAILY  famotidine (PEPCID) 20 mg Oral Tablet, Take 20 mg by mouth Twice daily  fenofibrate nanocrystallized (TRICOR) 145 mg Oral Tablet, TAKE 1 TABLET DAILY  ferrous sulfate (FERATAB) 324 mg (65 mg iron) Oral Tablet, Delayed Release (E.C.), Take 324 mg by mouth Twice daily  KRILL OIL ORAL, Take by mouth  LUTEIN ORAL, Take by mouth  Magnesium 250 mg Oral Tablet, Take by mouth Twice daily  melatonin 1 mg Oral Tablet, As directed  multivitamin (CENTRUM SILVER) 400-250 mcg Oral Tablet, Chewable, Take 1 Tab by mouth Once a day Taking different  brank with no vit K  nitroGLYCERIN (NITROSTAT) 0.4 mg Sublingual Tablet, Sublingual, 1 Tab (0.4 mg total) by Sublingual route Every 5 minutes as needed for Chest pain for 3 doses over 15 minutes  pantoprazole (PROTONIX) 40 mg Oral Tablet, Delayed Release (E.C.), Take 1 Tab (40 mg total) by mouth Once a day  triamcinolone acetonide (ARISTOCORT A) 0.1 % Ointment, APPLY TO AFFECTED AREA TWICE A DAY AS NEEDED  UBIDECARENONE (COQ-10 ORAL), Once a day  warfarin (COUMADIN) 10 mg Oral Tablet, Daily or as directed  Warfarin (COUMADIN) 4 mg Oral Tablet, Daily or as directed    No facility-administered medications prior to visit.     Allergies   Allergen Reactions   . Gluten      Celiac disease     Family Medical History:     Problem Relation (Age of Onset)    Emphysema Father    Heart Attack General Family Hx    Heart Disease Mother    Hypertension (High Blood Pressure) Mother    No Known Problems Other    Stroke Mother            Social History     Tobacco Use   . Smoking status: Never Smoker   . Smokeless tobacco: Never Used   Substance Use Topics   . Alcohol use: Not Currently     Frequency: Monthly or less     Comment: rare   . Drug use: Never       Review of Systems  Review of Systems   Constitution: Negative for decreased appetite, malaise/fatigue and weight gain.   Cardiovascular: Negative for chest pain and palpitations.   Respiratory: Negative for cough, hemoptysis and shortness of breath.    Musculoskeletal: Negative for back pain and myalgias.   Gastrointestinal: Positive for abdominal pain and change in bowel habit. Negative for melena.   Neurological: Positive for dizziness and light-headedness. Negative for headaches.       Physical Exam:  BP 130/78 (Site: Right, Patient Position: Sitting, Cuff Size: Adult Large)   Pulse 73   Temp 35.9 C (96.6 F) (Temporal)   Resp 18   Ht 1.588 m (5' 2.5")   Wt 107 kg (236 lb)   SpO2 98%   BMI 42.48 kg/m       Physical Exam   Constitutional: He is oriented to person,  place, and time. No distress.   Neck: Neck supple. No JVD present. No thyromegaly present.   Cardiovascular: Normal  rate and regular rhythm.   Pulmonary/Chest: Effort normal and breath sounds normal. He has no wheezes.   Abdominal: Soft. Bowel sounds are normal. He exhibits no mass. There is no abdominal tenderness.   Musculoskeletal:         General: No edema.   Lymphadenopathy:     He has no cervical adenopathy.   Neurological: He is alert and oriented to person, place, and time.   Psychiatric: Affect normal.   Vitals reviewed.      Assessment/Plan:    1. Mixed hyperlipidemia  Continue meds and nutrition     2. Anemia, unspecified type  conitnue to monitor with iron and meds with coumadin   - Refer to [facility] ([facility]); Future    3. Coronary artery disease involving native coronary artery of native heart with unstable angina pectoris (CMS [facility])  With some dizziness continue with cardiology for possible Afib     4. Diet-controlled diabetes mellitus (CMS HCC)  overall stable conitnue to work with diet     5. Essential hypertension    stable     6. Pulmonary embolism, unspecified chronicity, unspecified pulmonary embolism type, unspecified whether acute cor pulmonale present (CMS HCC)  Stable coumadin monitored by cardiology     7. Change in bowel habits  Even thogh watching diet closely with h/o celiac disease refer back to GI   - Refer to [facility] ([facility]); Future       Orders Placed This Encounter   . Refer to [facility] ([facility])   . cholestyramine-sucrose (QUESTRAN) 4 gram Oral Powder in Packet              Follow up: Return in about 6 months (around 03/03/2020), or if symptoms worsen or fail to improve.  Seek medical attention for new or worsening symptoms.  Patient has been seen in this clinic within the last 3 years.     [name], MD          This note was partially created using MModal Fluency Direct system (voice recognition software) and is inherently subject to errors including those of syntax and  "sound-alike" substitutions which may escape proofreading.  In such instances, original meaning may be extrapolated by contextual derivation.

## 2019-09-09 ENCOUNTER — Other Ambulatory Visit: Payer: Self-pay

## 2019-09-09 ENCOUNTER — Ambulatory Visit (INDEPENDENT_AMBULATORY_CARE_PROVIDER_SITE_OTHER): Payer: BC Managed Care – PPO

## 2019-09-09 DIAGNOSIS — I4891 Unspecified atrial fibrillation: Secondary | ICD-10-CM

## 2019-09-09 LAB — POCT INR (RESULTS): POCT INR: 3

## 2019-09-12 ENCOUNTER — Ambulatory Visit (HOSPITAL_BASED_OUTPATIENT_CLINIC_OR_DEPARTMENT_OTHER): Payer: Medicare Other

## 2019-09-12 ENCOUNTER — Other Ambulatory Visit: Payer: Self-pay

## 2019-09-12 ENCOUNTER — Encounter (HOSPITAL_BASED_OUTPATIENT_CLINIC_OR_DEPARTMENT_OTHER): Payer: Self-pay

## 2019-09-12 DIAGNOSIS — R42 Dizziness and giddiness: Secondary | ICD-10-CM

## 2019-09-12 NOTE — Nursing Note (Signed)
72 hour epatch applied.  Instructions given.  Pt verbalized understanding.  [employer], MA  09/12/2019, 16:14

## 2019-09-18 ENCOUNTER — Encounter (INDEPENDENT_AMBULATORY_CARE_PROVIDER_SITE_OTHER): Payer: BC Managed Care – PPO

## 2019-09-18 ENCOUNTER — Other Ambulatory Visit: Payer: Self-pay

## 2019-09-18 DIAGNOSIS — R42 Dizziness and giddiness: Secondary | ICD-10-CM

## 2019-09-26 ENCOUNTER — Other Ambulatory Visit (INDEPENDENT_AMBULATORY_CARE_PROVIDER_SITE_OTHER): Payer: Self-pay | Admitting: Family Medicine

## 2019-09-26 DIAGNOSIS — E782 Mixed hyperlipidemia: Secondary | ICD-10-CM

## 2019-09-26 NOTE — Telephone Encounter (Signed)
Last scheduled appointment with you was 09/03/2019.  Currently scheduled future appointment is 03/04/2020.    Patient has been seen within the last year: Yes.    I called [facility] [address] due to  Pt should have refills.   However the pharmacist that states that pt's insurance requires him to have 90 day rx after first 30days. Therefore i advised him to fill for 90day.     Clint Bolder, MA  09/26/2019, 14:47

## 2019-09-27 NOTE — Procedures (Signed)
Minimum heart rate 50 beats per minute, no pauses  Average heart rate 67 beats per minute  Rare premature ventricular contractions (5.8/hour)  Rare premature atrial contractions  2 short runs of supraventricular tachycardia with the longest being 5 beats  Diary was returned and complaints coincided with sinus rhythm

## 2019-09-27 NOTE — Nursing Note (Signed)
Monitor returned.

## 2019-09-30 ENCOUNTER — Encounter (HOSPITAL_BASED_OUTPATIENT_CLINIC_OR_DEPARTMENT_OTHER): Payer: Self-pay

## 2019-09-30 ENCOUNTER — Ambulatory Visit (HOSPITAL_BASED_OUTPATIENT_CLINIC_OR_DEPARTMENT_OTHER): Payer: Self-pay | Admitting: Cardiovascular Disease

## 2019-09-30 NOTE — Telephone Encounter (Signed)
Attempted to call patient with recent heart monitor results. Left VM with callback information Aris Lot, RN  09/30/2019, 11:22

## 2019-10-01 ENCOUNTER — Ambulatory Visit (HOSPITAL_BASED_OUTPATIENT_CLINIC_OR_DEPARTMENT_OTHER): Payer: Self-pay

## 2019-10-01 NOTE — Telephone Encounter (Signed)
Returned call to patient to discuss monitor results. Patient states he has not experienced further dizzy spells since cutting out caffeine. States he was never sensitive to this in past, but noticed dizziness would come on when caffeine had been consumed. Encouraged patient to call with any questions or concerns to which he verbalizes understanding. Dionisio Paschal, RN  10/01/2019, 11:15

## 2019-10-14 ENCOUNTER — Other Ambulatory Visit: Payer: Self-pay

## 2019-10-14 ENCOUNTER — Ambulatory Visit (INDEPENDENT_AMBULATORY_CARE_PROVIDER_SITE_OTHER): Payer: BC Managed Care – PPO

## 2019-10-14 DIAGNOSIS — I2699 Other pulmonary embolism without acute cor pulmonale: Secondary | ICD-10-CM

## 2019-10-14 LAB — POCT INR (RESULTS): POCT INR: 2.1

## 2019-10-21 DIAGNOSIS — Z209 Contact with and (suspected) exposure to unspecified communicable disease: Secondary | ICD-10-CM | POA: Insufficient documentation

## 2019-10-21 DIAGNOSIS — J029 Acute pharyngitis, unspecified: Secondary | ICD-10-CM | POA: Insufficient documentation

## 2019-10-29 ENCOUNTER — Other Ambulatory Visit (INDEPENDENT_AMBULATORY_CARE_PROVIDER_SITE_OTHER): Payer: Self-pay | Admitting: Family Medicine

## 2019-11-12 ENCOUNTER — Encounter (HOSPITAL_BASED_OUTPATIENT_CLINIC_OR_DEPARTMENT_OTHER): Payer: Self-pay

## 2019-11-13 ENCOUNTER — Other Ambulatory Visit: Payer: Self-pay

## 2019-11-13 ENCOUNTER — Encounter (INDEPENDENT_AMBULATORY_CARE_PROVIDER_SITE_OTHER): Payer: Self-pay | Admitting: Family Medicine

## 2019-11-13 ENCOUNTER — Ambulatory Visit (INDEPENDENT_AMBULATORY_CARE_PROVIDER_SITE_OTHER): Payer: BC Managed Care – PPO

## 2019-11-13 DIAGNOSIS — I2699 Other pulmonary embolism without acute cor pulmonale: Secondary | ICD-10-CM

## 2019-11-13 LAB — POCT INR (RESULTS): POCT INR: 2.7 (ref 2.0–3.0)

## 2019-11-22 ENCOUNTER — Encounter (INDEPENDENT_AMBULATORY_CARE_PROVIDER_SITE_OTHER): Payer: Self-pay | Admitting: Family Medicine

## 2019-11-22 ENCOUNTER — Other Ambulatory Visit (INDEPENDENT_AMBULATORY_CARE_PROVIDER_SITE_OTHER): Payer: Self-pay | Admitting: Family Medicine

## 2019-11-22 DIAGNOSIS — E782 Mixed hyperlipidemia: Secondary | ICD-10-CM

## 2019-11-22 DIAGNOSIS — M1A9XX0 Chronic gout, unspecified, without tophus (tophi): Secondary | ICD-10-CM

## 2019-11-22 NOTE — Telephone Encounter (Signed)
-----   Message from Ashby Dawes. Tolley sent at 11/22/2019  2:06 PM EST -----  Regarding: Prescription Question  Contact: [phone]  Faythe Dingwall,  I am turning 64 and have had to go on Medicare. I can no longer [facility] my [facility].  I have a new Medicare RX card from [facility]. The Member ID is [id number], RXbin: [id number], [id number]:  [id number], RX group: [id number]   Plan [phone]) [id number].   Mail delivery pharmacy [phone].   Please send  prescriptions  for all my medications to that pharmacy so they can set up me on automatic delivery.  Phone number for Pharmacist/Physician  is [phone] .   Card says to submit claims to  [employer], [place] [address].  THANKS.  If I need to do something let me know.

## 2019-11-24 MED ORDER — ALLOPURINOL 100 MG TABLET
ORAL_TABLET | ORAL | 3 refills | Status: DC
Start: 2019-11-24 — End: 2020-11-10

## 2019-11-24 MED ORDER — FENOFIBRATE NANOCRYSTALLIZED 145 MG TABLET
ORAL_TABLET | ORAL | 3 refills | Status: DC
Start: 2019-11-24 — End: 2020-11-18

## 2019-11-24 MED ORDER — PANTOPRAZOLE 40 MG TABLET,DELAYED RELEASE
DELAYED_RELEASE_TABLET | ORAL | 3 refills | Status: DC
Start: 2019-11-24 — End: 2019-12-19

## 2019-11-24 MED ORDER — CITALOPRAM 10 MG TABLET
ORAL_TABLET | ORAL | 3 refills | Status: DC
Start: 2019-11-24 — End: 2020-11-18

## 2019-11-25 ENCOUNTER — Other Ambulatory Visit (HOSPITAL_BASED_OUTPATIENT_CLINIC_OR_DEPARTMENT_OTHER): Payer: Self-pay | Admitting: Cardiovascular Disease

## 2019-11-25 MED ORDER — WARFARIN 4 MG TABLET
ORAL_TABLET | ORAL | 3 refills | Status: DC
Start: 2019-11-25 — End: 2020-11-03

## 2019-11-25 MED ORDER — WARFARIN 10 MG TABLET
ORAL_TABLET | ORAL | 3 refills | Status: DC
Start: 2019-11-25 — End: 2023-05-03

## 2019-12-13 ENCOUNTER — Ambulatory Visit (INDEPENDENT_AMBULATORY_CARE_PROVIDER_SITE_OTHER): Payer: Medicare Other

## 2019-12-13 ENCOUNTER — Other Ambulatory Visit: Payer: Self-pay

## 2019-12-13 DIAGNOSIS — I2699 Other pulmonary embolism without acute cor pulmonale: Secondary | ICD-10-CM

## 2019-12-13 LAB — POCT INR (RESULTS): POCT INR: 3 (ref 2.0–3.0)

## 2019-12-19 ENCOUNTER — Encounter

## 2019-12-19 ENCOUNTER — Other Ambulatory Visit: Payer: Self-pay

## 2019-12-19 ENCOUNTER — Encounter (INDEPENDENT_AMBULATORY_CARE_PROVIDER_SITE_OTHER): Payer: Self-pay | Admitting: Family

## 2019-12-19 ENCOUNTER — Ambulatory Visit: Payer: Medicare Other | Attending: Family | Admitting: Family

## 2019-12-19 ENCOUNTER — Ambulatory Visit (INDEPENDENT_AMBULATORY_CARE_PROVIDER_SITE_OTHER): Payer: Medicare Other

## 2019-12-19 VITALS — BP 136/90 | Ht 75.0 in | Wt 238.0 lb

## 2019-12-19 DIAGNOSIS — D509 Iron deficiency anemia, unspecified: Secondary | ICD-10-CM | POA: Insufficient documentation

## 2019-12-19 DIAGNOSIS — R194 Change in bowel habit: Secondary | ICD-10-CM | POA: Insufficient documentation

## 2019-12-19 DIAGNOSIS — Z79899 Other long term (current) drug therapy: Secondary | ICD-10-CM | POA: Insufficient documentation

## 2019-12-19 DIAGNOSIS — G4700 Insomnia, unspecified: Secondary | ICD-10-CM | POA: Insufficient documentation

## 2019-12-19 DIAGNOSIS — Z7982 Long term (current) use of aspirin: Secondary | ICD-10-CM | POA: Insufficient documentation

## 2019-12-19 DIAGNOSIS — Z01818 Encounter for other preprocedural examination: Secondary | ICD-10-CM

## 2019-12-19 DIAGNOSIS — K219 Gastro-esophageal reflux disease without esophagitis: Secondary | ICD-10-CM | POA: Insufficient documentation

## 2019-12-19 DIAGNOSIS — D649 Anemia, unspecified: Secondary | ICD-10-CM

## 2019-12-19 DIAGNOSIS — K2100 Gastro-esophageal reflux disease with esophagitis, without bleeding: Secondary | ICD-10-CM | POA: Insufficient documentation

## 2019-12-19 DIAGNOSIS — R197 Diarrhea, unspecified: Secondary | ICD-10-CM | POA: Insufficient documentation

## 2019-12-19 DIAGNOSIS — Z7901 Long term (current) use of anticoagulants: Secondary | ICD-10-CM | POA: Insufficient documentation

## 2019-12-19 LAB — CBC WITH DIFF
BASOPHIL #: <0.1 10*3/uL (ref <=0.20)
BASOPHIL %: 2 %
EOSINOPHIL #: 0.27 10*3/uL (ref <=0.50)
EOSINOPHIL %: 6 %
HCT: 43.7 % (ref 38.9–52.0)
HGB: 14 g/dL (ref 13.4–17.5)
IMMATURE GRANULOCYTE #: <0.1 10*3/uL (ref <0.10)
IMMATURE GRANULOCYTE %: 1 % (ref 0–1)
LYMPHOCYTE #: 1.45 10*3/uL (ref 1.00–4.80)
LYMPHOCYTE %: 31 %
MCH: 28.3 pg (ref 26.0–32.0)
MCHC: 32 g/dL (ref 31.0–35.5)
MCV: 88.3 fL (ref 78.0–100.0)
MONOCYTE #: 0.48 10*3/uL (ref 0.20–1.10)
MONOCYTE %: 10 %
MPV: 11.8 fL (ref 8.7–12.5)
NEUTROPHIL #: 2.39 10*3/uL (ref 1.50–7.70)
NEUTROPHIL %: 50 %
PLATELETS: 231 10*3/uL (ref 150–400)
RBC: 4.95 10*6/uL (ref 4.50–6.10)
RDW-CV: 13.6 % (ref 11.5–15.5)
WBC: 4.7 10*3/uL (ref 3.7–11.0)

## 2019-12-19 LAB — COMPREHENSIVE METABOLIC PANEL, NON-FASTING
ALBUMIN: 4.4 g/dL (ref 3.4–4.8)
ALKALINE PHOSPHATASE: 42 U/L — ABNORMAL LOW (ref 45–115)
ALT (SGPT): 65 U/L — ABNORMAL HIGH (ref 10–55)
ANION GAP: 8 mmol/L (ref 4–13)
AST (SGOT): 38 U/L (ref 8–45)
BILIRUBIN TOTAL: 0.4 mg/dL (ref 0.3–1.3)
BUN/CREA RATIO: 17 (ref 6–22)
BUN: 23 mg/dL (ref 8–25)
CALCIUM: 9.5 mg/dL (ref 8.8–10.2)
CHLORIDE: 103 mmol/L (ref 96–111)
CO2 TOTAL: 25 mmol/L (ref 23–31)
CREATININE: 1.39 mg/dL — ABNORMAL HIGH (ref 0.75–1.35)
ESTIMATED GFR: 53 mL/min/BSA — ABNORMAL LOW (ref >=60)
GLUCOSE: 173 mg/dL — ABNORMAL HIGH (ref 65–125)
POTASSIUM: 3.8 mmol/L (ref 3.5–5.1)
PROTEIN TOTAL: 7.4 g/dL (ref 6.0–8.0)
SODIUM: 136 mmol/L (ref 136–145)

## 2019-12-19 LAB — THYROID STIMULATING HORMONE (SENSITIVE TSH): TSH: 2.577 u[IU]/mL (ref 0.430–3.550)

## 2019-12-19 LAB — LIPASE: LIPASE: 34 U/L (ref 10–60)

## 2019-12-19 LAB — AMYLASE: AMYLASE: 55 U/L (ref 25–125)

## 2019-12-19 MED ORDER — FAMOTIDINE 40 MG TABLET
40.00 mg | ORAL_TABLET | Freq: Every evening | ORAL | 1 refills | Status: DC
Start: 2019-12-19 — End: 2019-12-25

## 2019-12-19 NOTE — H&P (Signed)
[facility], [facility]  [place]  [place] [address]  [phone]    History and Physical  Name: Dustin Webster  MRN: [id number]  DOB: June 26, 1956  Age: 64 y.o.  Date: 12/19/2019  Referring provider: [name], MD    Reason for Visit: Establish Care (Change in bowel habits and GERD)    History of Present Illness:  Dustin Webster is a 64 y.o. male who presents today for change in bowel, GERD.    08/27/2019 H/H 13.8/42.6, MCV 87.1, MCH 28.2, ferritin 37  09/2012 EGD/Colo - Grade B esophagitis, duodenum biopsy (flattened villi with chronic inflammation and congestion favoring celiac), normal colo    States that he has had chronic diarrhea for years. States he has soft stool to loose stool. Moving bowels 3-4 times daily. Was placed on cholestyramine by PCP. Taking 1 packet daily. States that this helped. Patient states that about a month ago, he started having 6-7 bowel movements daily but this was soft stool. States within the last week he has started moving his bowels 3-4 times daily. Changing from soft stool to diarrhea. States stool is dark secondary to iron. Does relate bright red blood noted on toilet paper with wiping. Denies family history of IBD.     Patient with history of celiac disease. States he is gluten free. Has history of GERD. Was placed on Protonix but states he was taken off of it due to decreased kidney function. Was placed on zantac and states this didn't help. Changed to Pepcid and had relief. States around 8 months ago he had issues with severe epigastric pain and anemia. Was placed back on Protonix 40mg  and states symptoms went away. Has since changed back to Pepcid. Taking 20mg  BID. Having heartburn and reflux ever 2-3 days. Worse with potato chips and coffee. States he still has intermittent epigastric pain now that he is taking Pepcid. Pain was gone with Protonix. Rare nausea. Denies dysphagia.        Patient on coumadin and ASA. Followed by Dr. Cornelia Copa    Patient  History:  Past Medical History:   Diagnosis Date   . Anemia    . Atrial fibrillation (CMS HCC)    . BPH associated with nocturia    . CAD (coronary artery disease)    . Chronic depression 05/23/2018   . CKD (chronic kidney disease)    . Diet-controlled diabetes mellitus (CMS [place]) 05/23/2018   . DVT (deep venous thrombosis) (CMS HCC)    . Gastroesophageal reflux disease without esophagitis 05/23/2018   . Insomnia 05/23/2018   . Malignant melanoma of right ear (CMS [place]) 05/23/2018   . Pericardial effusion    . Pulmonary emboli (CMS [facility])      Past Surgical History:   Procedure Laterality Date   . HX CHOLECYSTECTOMY     . HX CORONARY ARTERY BYPASS GRAFT     . HX HEART SURGERY  12/2015   . HX HERNIA REPAIR      Inguinal    . HX LIPOMA RESECTION      Chest   . OTHER SURGICAL HISTORY Left 01/2016    Evacuation of hematoma -Left Atrium  at [facility]    . SKIN CANCER EXCISION  06/21/2013    melanoma/ear/[facility]   . TRANSURETHRAL MICROWAVE THERAPY  05/24/2018    Dr Leandro Reasoner     Current Outpatient Medications   Medication Sig   . allopurinoL (ZYLOPRIM) 100 mg Oral Tablet TAKE 1 TABLET DAILY   .  amLODIPine (NORVASC) 10 mg Oral Tablet TAKE 1 TABLET DAILY   . ascorbic acid, vitamin C, (VITAMIN C) 500 mg Oral Tablet 0.5 Tabs Twice daily   . aspirin (ECOTRIN) 81 mg Oral Tablet, Delayed Release (E.C.) Once a day   . Calcium Carbonate (OS-CAL) 650 mg calcium (1,625 mg) Oral Tablet Every evening    . cholestyramine-aspartame (PREVALITE) 4 gram Oral Powder in Packet Take 4 g by mouth Every evening with dinner   . citalopram (CELEXA) 10 mg Oral Tablet TAKE 1 TABLET DAILY   . famotidine (PEPCID) 20 mg Oral Tablet Take 20 mg by mouth Twice daily   . fenofibrate nanocrystallized (TRICOR) 145 mg Oral Tablet TAKE 1 TABLET DAILY   . ferrous sulfate (FERATAB) 324 mg (65 mg iron) Oral Tablet, Delayed Release (E.C.) Take 324 mg by mouth Twice daily   . KRILL OIL ORAL Take by mouth   . LUTEIN ORAL Take by mouth   . Magnesium 250 mg Oral Tablet Take by mouth Twice  daily   . melatonin 1 mg Oral Tablet As directed   . multivitamin (CENTRUM SILVER) 400-250 mcg Oral Tablet, Chewable Take 1 Tab by mouth Once a day Taking different brank with no vit K   . nitroGLYCERIN (NITROSTAT) 0.4 mg Sublingual Tablet, Sublingual 1 Tab (0.4 mg total) by Sublingual route Every 5 minutes as needed for Chest pain for 3 doses over 15 minutes   . triamcinolone acetonide (ARISTOCORT A) 0.1 % Ointment APPLY TO AFFECTED AREA TWICE A DAY AS NEEDED   . UBIDECARENONE (COQ-10 ORAL) Once a day   . warfarin (COUMADIN) 10 mg Oral Tablet Daily or as directed   . Warfarin (COUMADIN) 4 mg Oral Tablet Daily or as directed     Allergies   Allergen Reactions   . Gluten      Celiac disease     Family Medical History:     Problem Relation (Age of Onset)    Emphysema Father    Heart Attack General Family Hx    Heart Disease Mother    Hypertension (High Blood Pressure) Mother    No Known Problems Other    Stroke Mother        Social History     Socioeconomic History   . Marital status: Married     Spouse name: Levada Dy   . Number of children: 1   . Years of education: Not on file   . Highest education level: Not on file   Occupational History   . 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 Financial resource strain: Not on file   . Food insecurity     Worry: Not on file     Inability: Not on file   . Transportation needs     Medical: Not on file     Non-medical: Not on file   Tobacco Use   . Smoking status: Never Smoker   . Smokeless tobacco: Never Used   Substance and Sexual Activity   . Alcohol use: Not Currently     Frequency: Monthly or less     Comment: rare   . Drug use: Never   . Sexual activity: Yes     Partners: Female   Lifestyle   . Physical activity     Days per week: Not on file     Minutes per session: Not on file   . Stress: Not on file   Relationships   .  Social Product manager on phone: Not on file     Gets together: Not on file     Attends religious service: Not  on file     Active member of club or organization: Not on file     Attends meetings of clubs or organizations: Not on file     Relationship status: Not on file   . Intimate partner violence     Fear of current or ex partner: Not on file     Emotionally abused: Not on file     Physically abused: Not on file     Forced sexual activity: Not on file   Other Topics Concern   . Not on file   Social History Narrative   . Not on file     Review of Systems:     General Negative for: Appetite Loss, Weight loss, Fatigue out of ordinary     ENT Negative for: Nose bleeds, Mouth sores, Sore throat     Respiratory Negative for: Coughing up blood, Wheezing, Nocturnal cough  Cardiovascular Positive for: Indigestion  Cardiovascular Negative for: Chest Pain, Leg edema  Gastronintestinal Positive for: Change in bowl habits, Heartburn  Gastronintestinal Negative for: Abdominal pain, Difficulty swallowing, Black/Tarry stools, Vomiting     Genitourinary Negative for: Pelvic Pain, Urinary frequency  All other review of systems negative.     Physical Exam:  BP (!) 136/90   Ht 1.905 m (6\' 3" )   Wt 108 kg (238 lb)   BMI 29.75 kg/m       Physical Exam  Constitutional:       General: He is not in acute distress.     Appearance: He is not diaphoretic.   HENT:      Head: Normocephalic.   Eyes:      Pupils: Pupils are equal, round, and reactive to light.   Cardiovascular:      Rate and Rhythm: Normal rate and regular rhythm.      Heart sounds: Normal heart sounds. No murmur.   Pulmonary:      Effort: Pulmonary effort is normal. No respiratory distress.      Breath sounds: Normal breath sounds. No wheezing.   Abdominal:      General: Bowel sounds are normal. There is no distension.      Palpations: Abdomen is soft. There is no mass.      Tenderness: There is no abdominal tenderness. There is no guarding or rebound.   Musculoskeletal: Normal range of motion.   Skin:     General: Skin is warm and dry.      Findings: No erythema or rash.      Neurological:      Mental Status: He is alert and oriented to person, place, and time.   Psychiatric:         Judgment: Judgment normal.       Assessment and Plan:      ICD-10-CM    1. Pre-op testing  Z01.818 COVID-19 SCREENING - PREOP   2. Change in bowel habits  R19.4 Refer to [facility] ([facility])     CBC/DIFF     COMPREHENSIVE METABOLIC PANEL, NON-FASTING     TISSUE TRANSGLUTAMINASE (TTG) ANTIBODY, IGA, SERUM     THYROID STIMULATING HORMONE (SENSITIVE TSH)     AMYLASE     LIPASE     ROUTINE STOOL CULTURE (INCLUDING E. COLI SHIGA TOXIN)     CLOSTRIDIUM DIFFICILE TOXIN A/B  OVA AND PARASITE SCREEN     LACTOFERRIN     PANCREATIC ELASTASE, FECES     CASE REQUEST SURGICAL: COLONOSCOPY, GASTROSCOPY   3. Anemia, unspecified type  D64.9 Refer to [facility] - ([facility])     CBC/DIFF     COMPREHENSIVE METABOLIC PANEL, NON-FASTING     TISSUE TRANSGLUTAMINASE (TTG) ANTIBODY, IGA, SERUM     THYROID STIMULATING HORMONE (SENSITIVE TSH)     AMYLASE     LIPASE     ROUTINE STOOL CULTURE (INCLUDING E. COLI SHIGA TOXIN)     CLOSTRIDIUM DIFFICILE TOXIN A/B     OVA AND PARASITE SCREEN     LACTOFERRIN     PANCREATIC ELASTASE, FECES     CASE REQUEST SURGICAL: COLONOSCOPY, GASTROSCOPY   4. Gastroesophageal reflux disease, unspecified whether esophagitis present  K21.9 CBC/DIFF     COMPREHENSIVE METABOLIC PANEL, NON-FASTING     TISSUE TRANSGLUTAMINASE (TTG) ANTIBODY, IGA, SERUM     THYROID STIMULATING HORMONE (SENSITIVE TSH)     AMYLASE     LIPASE     ROUTINE STOOL CULTURE (INCLUDING E. COLI SHIGA TOXIN)     CLOSTRIDIUM DIFFICILE TOXIN A/B     OVA AND PARASITE SCREEN     LACTOFERRIN     PANCREATIC ELASTASE, FECES     CASE REQUEST SURGICAL: COLONOSCOPY, GASTROSCOPY     Labs and stool studies ordered to further evaluate symptoms. Will increase Pepcid to 40mg  BID. Holding off on PPI at this time due to decreased renal function. Recommended benefiber 2 teaspoons daily to regulate bowels. Patient will be scheduled for an EGD and colonoscopy to further  evaluate. Discussed risks of procedure and reviewed prep instructions, patient voiced understanding and wishes to proceed. Will obtain cardiac clearance and Coumadin/ASA hold parameters from Dr. Cornelia Copa. Follow up post procedure, sooner if needed.    Sheila Oats, FNP-C  12/19/2019, 08:06    This note may have been partially generated using MModal Fluency Direct system, and there may be some incorrect words, spellings, and punctuation that were not noted in checking the note before saving, though effort was made to avoid such errors.

## 2019-12-20 ENCOUNTER — Ambulatory Visit: Payer: Medicare Other | Attending: Family

## 2019-12-20 DIAGNOSIS — D649 Anemia, unspecified: Secondary | ICD-10-CM | POA: Insufficient documentation

## 2019-12-20 DIAGNOSIS — R194 Change in bowel habit: Secondary | ICD-10-CM

## 2019-12-20 DIAGNOSIS — K219 Gastro-esophageal reflux disease without esophagitis: Secondary | ICD-10-CM | POA: Insufficient documentation

## 2019-12-20 LAB — TISSUE TRANSGLUTAMINASE (TTG) ANTIBODY, IGA, SERUM
TISSUE TRANSGLUTAMINASE ANTIBODIES IGA QUALITATIVE: NEGATIVE
TISSUE TRANSGLUTAMINASE ANTIBODIES IGA QUANTITATIVE: <0.5 U/mL (ref <15.0)

## 2019-12-20 LAB — CLOSTRIDIUM DIFFICILE TOXIN A/B: CLOSTRIDIUM DIFFICILE TOXIN A/B: NOT DETECTED

## 2019-12-22 LAB — ROUTINE STOOL CULTURE (INCLUDING E. COLI SHIGA TOXIN)

## 2019-12-23 LAB — LACTOFERRIN: LACTOFERRIN: NEGATIVE

## 2019-12-23 LAB — OVA AND PARASITE SCREEN: OVA & PARASITE SCREEN TRICHROME: NONE SEEN

## 2019-12-24 LAB — PANCREATIC ELASTASE, FECES: PANCREATIC ELASTASE, F: 216 mcg/g

## 2019-12-25 ENCOUNTER — Other Ambulatory Visit (INDEPENDENT_AMBULATORY_CARE_PROVIDER_SITE_OTHER): Payer: Self-pay | Admitting: Family Medicine

## 2019-12-25 ENCOUNTER — Other Ambulatory Visit (INDEPENDENT_AMBULATORY_CARE_PROVIDER_SITE_OTHER): Payer: Self-pay | Admitting: Family

## 2019-12-26 MED ORDER — FAMOTIDINE 40 MG TABLET
40.0000 mg | ORAL_TABLET | Freq: Two times a day (BID) | ORAL | 1 refills | Status: DC
Start: 2019-12-26 — End: 2020-03-25

## 2020-01-03 ENCOUNTER — Encounter (INDEPENDENT_AMBULATORY_CARE_PROVIDER_SITE_OTHER): Payer: Self-pay

## 2020-01-06 ENCOUNTER — Other Ambulatory Visit: Payer: Self-pay

## 2020-01-06 ENCOUNTER — Ambulatory Visit (INDEPENDENT_AMBULATORY_CARE_PROVIDER_SITE_OTHER): Payer: Medicare Other

## 2020-01-06 DIAGNOSIS — Z86718 Personal history of other venous thrombosis and embolism: Secondary | ICD-10-CM

## 2020-01-06 DIAGNOSIS — I2699 Other pulmonary embolism without acute cor pulmonale: Secondary | ICD-10-CM

## 2020-01-06 DIAGNOSIS — I4891 Unspecified atrial fibrillation: Secondary | ICD-10-CM

## 2020-01-06 LAB — POCT INR (RESULTS): POCT INR: 2.6 (ref 2–3)

## 2020-01-14 ENCOUNTER — Other Ambulatory Visit: Payer: Self-pay

## 2020-01-14 ENCOUNTER — Ambulatory Visit (INDEPENDENT_AMBULATORY_CARE_PROVIDER_SITE_OTHER): Payer: Medicare Other | Admitting: Family Medicine

## 2020-01-14 ENCOUNTER — Other Ambulatory Visit (INDEPENDENT_AMBULATORY_CARE_PROVIDER_SITE_OTHER): Payer: Medicare Other

## 2020-01-14 VITALS — BP 130/84 | HR 67 | Temp 97.4°F | Ht 75.0 in | Wt 246.0 lb

## 2020-01-14 DIAGNOSIS — K900 Celiac disease: Secondary | ICD-10-CM

## 2020-01-14 DIAGNOSIS — E119 Type 2 diabetes mellitus without complications: Secondary | ICD-10-CM

## 2020-01-14 DIAGNOSIS — D649 Anemia, unspecified: Secondary | ICD-10-CM

## 2020-01-14 DIAGNOSIS — I10 Essential (primary) hypertension: Secondary | ICD-10-CM

## 2020-01-14 DIAGNOSIS — Z6830 Body mass index (BMI) 30.0-30.9, adult: Secondary | ICD-10-CM

## 2020-01-14 DIAGNOSIS — I25110 Atherosclerotic heart disease of native coronary artery with unstable angina pectoris: Secondary | ICD-10-CM

## 2020-01-14 DIAGNOSIS — E782 Mixed hyperlipidemia: Secondary | ICD-10-CM

## 2020-01-14 LAB — MANUAL DIFFERENTIAL
BASOPHIL %: 1 % (ref 0–1)
BASOPHIL ABSOLUTE: 0.04 10*3/uL (ref 0.00–0.10)
EOSINOPHIL %: 7 % (ref 0–7)
EOSINOPHIL ABSOLUTE: 0.25 10*3/uL (ref 0.00–0.40)
LYMPHOCYTE %: 32 % (ref 25–40)
LYMPHOCYTE ABSOLUTE: 1.15 10*3/uL (ref 1.00–3.70)
MONOCYTE %: 10 % (ref 0–10)
MONOCYTE ABSOLUTE: 0.36 10*3/uL (ref 0.00–0.70)
NEUTROPHIL %: 50 % (ref 37–81)
NEUTROPHIL ABSOLUTE: 1.8 10*3/uL (ref 1.50–7.00)
PLATELET MORPHOLOGY COMMENT: NORMAL
WBC: 3.6 10*3/uL

## 2020-01-14 LAB — COMPREHENSIVE METABOLIC PNL, FASTING
ALBUMIN: 4.6 g/dL (ref 3.5–5.0)
ALKALINE PHOSPHATASE: 40 U/L (ref 38–126)
ALT (SGPT): 66 U/L — ABNORMAL HIGH (ref <=50)
ANION GAP: 9 mmol/L
AST (SGOT): 54 U/L (ref 17–59)
BILIRUBIN TOTAL: 0.5 mg/dL (ref 0.2–5.0)
BUN/CREA RATIO: 15
BUN: 20 mg/dL (ref 9–20)
CALCIUM: 10 mg/dL (ref 8.4–10.2)
CHLORIDE: 106 mmol/L (ref 98–107)
CO2 TOTAL: 25 mmol/L (ref 22–30)
CREATININE: 1.35 mg/dL — ABNORMAL HIGH (ref 0.66–1.25)
ESTIMATED GFR: 55 mL/min/{1.73_m2} — ABNORMAL LOW (ref >=60)
GLUCOSE: 114 mg/dL — ABNORMAL HIGH (ref 74–106)
POTASSIUM: 4.8 mmol/L (ref 3.5–5.1)
PROTEIN TOTAL: 7.4 g/dL (ref 6.3–8.2)
SODIUM: 140 mmol/L (ref 137–145)

## 2020-01-14 LAB — CBC WITH DIFF
HCT: 36.5 % — ABNORMAL LOW (ref 40.7–52.3)
HGB: 11.5 g/dL — ABNORMAL LOW (ref 12.5–16.9)
MCH: 28.8 pg (ref 27.0–33.4)
MCHC: 31.5 g/dL (ref 31.0–37.0)
MCV: 91.5 fL (ref 84.1–99.3)
PLATELETS: 213 10*3/uL (ref 129–381)
RBC: 3.99 10*6/uL — ABNORMAL LOW (ref 4.09–5.89)
RDW: 16.2 % — ABNORMAL HIGH (ref 10.8–15.2)
WBC: 3.6 10*3/uL (ref 2.6–9.8)

## 2020-01-14 LAB — LIPID PANEL
CHOLESTEROL: 155 mg/dL (ref 0–199)
HDL CHOL: 33 mg/dL — ABNORMAL LOW (ref 40–60)
LDL CALC: 78 mg/dL (ref 0–130)
TRIGLYCERIDES: 221 mg/dL — ABNORMAL HIGH (ref 0–149)
VLDL CALC: 44 mg/dL

## 2020-01-14 LAB — HGA1C (HEMOGLOBIN A1C WITH EST AVG GLUCOSE)
ESTIMATED AVERAGE GLUCOSE: 134 mg/dL
HEMOGLOBIN A1C: 6.3 % — ABNORMAL HIGH (ref 4.3–6.1)

## 2020-01-14 LAB — IRON: IRON: 211 ug/dL — ABNORMAL HIGH (ref 49–181)

## 2020-01-14 LAB — VITAMIN D 25 TOTAL: VITAMIN D 25, TOTAL: 24.6 ng/mL — ABNORMAL LOW (ref 30.00–100.00)

## 2020-01-14 LAB — FERRITIN: FERRITIN: 34 ng/mL (ref 18–464)

## 2020-01-14 LAB — THYROID STIMULATING HORMONE WITH FREE T4 REFLEX: TSH: 2.19 u[IU]/mL (ref 0.465–4.680)

## 2020-01-14 NOTE — Nursing Note (Signed)
01/14/20 1109   Fall Risk Assessment   Do you feel unsteady when standing or walking? No   Do you worry about falling? No   Have you fallen in the past year? No

## 2020-01-14 NOTE — Ancillary Notes (Signed)
Department of Community Practice     Venipuncture performed in office on left arm antecubital vein, dry pressure dressing was applied to site and patient tolerated it well.  Specimen was centrifuged, aliquoted as needed and specimen was labeled and packaged for transport.    Woodroe Mode, PHLEBOTOMIST  01/14/2020, 12:05

## 2020-01-14 NOTE — Nursing Note (Signed)
01/14/20 1100   PHQ 9 (follow up)   Little interest or pleasure in doing things. 1   Feeling down, depressed, or hopeless 0   Trouble falling or staying asleep, or sleeping too much. 3   Feeling tired or having little energy 3   Poor appetite or overeating 0   Feeling bad about yourself/ that you are a failure in the past 2 weeks? 0   Trouble concentrating on things in the past 2 weeks? 0   Moving/Speaking slowly or being fidgety or restless  in the past 2 weeks? 0   Thoughts that you would be better off DEAD, or of hurting yourself in some way. 0   If you checked off any problems, how difficult have these problems made it for you to do your work, take care of things at home, or get along with other people? Not difficult at all   PHQ 9 Total 7   Interpretation of Total Score Mild depression

## 2020-01-15 ENCOUNTER — Telehealth (INDEPENDENT_AMBULATORY_CARE_PROVIDER_SITE_OTHER): Payer: Self-pay

## 2020-01-15 NOTE — Telephone Encounter (Signed)
Dustin Webster is calling and asking for Dustin Webster to be added to the cancellation list.

## 2020-01-15 NOTE — Telephone Encounter (Signed)
Added pt to cancellation list scheduled for EGD/Colo on 07-17-20.     Dustin Vessey, LPN

## 2020-01-17 NOTE — Progress Notes (Signed)
PRIMARY CARE, [facility]  [place] [address]    History and Physical     Name: Dustin Webster MRN:  [id number]   Date: 01/14/2020 Age: 64 y.o.           PCP: [name], MD     Reason for Visit: Follow Up 6 Months (Patient states he has been having chest pain on the left side which is normal since his heart surgery ~4 years ago), Fatigue (Patient states he feels fatigued and wanting to sleep 12 hours a day), and Diabetes (Checking BG typically AM ~ 141 and evening ~ 178)    History of Present Illness  Dustin Webster is a 64 y.o. male who is being seen today for follow-up.  Main concern is his diabetes.  Blood sugars as above.  Admits not really watching his diet has he should.  They have also not been exercising.  Does have regular eye exams.  Currently not on any medications.  History of celiac disease as so tries to avoid gluten but does not always make other healthy choices.  More fatigued than normal in concerns that his blood sugar baby contributing.  Still very active volunteering and doing physical work in his yd.  No shortness of breath or palpitations.  Known coronary artery disease status post surgery and has had chronic intermittent pain since then which has not changed and is not associated with exertion etc.  History of anemia but that had been stable at last labs as well.  No snoring no falling asleep while driving or other.  Blood pressures remained stable and no other changes in medications.  Continues with his iron supplements no active bleeding or other.  Remains on low-dose Celexa and states his moods are good.  On Coumadin for recurrent DVT/PE    Past Medical History:   Diagnosis Date   . Anemia    . Atrial fibrillation (CMS HCC)    . BPH associated with nocturia    . CAD (coronary artery disease)    . Chronic depression 05/23/2018   . CKD (chronic kidney disease)    . Diet-controlled diabetes mellitus (CMS [place]) 05/23/2018   . DVT (deep venous thrombosis)  (CMS HCC)    . Gastroesophageal reflux disease without esophagitis 05/23/2018   . Insomnia 05/23/2018   . Malignant melanoma of right ear (CMS [place]) 05/23/2018   . Pericardial effusion    . Pulmonary emboli (CMS [facility])          Past Surgical History:   Procedure Laterality Date   . HX CHOLECYSTECTOMY     . HX CORONARY ARTERY BYPASS GRAFT     . HX HEART SURGERY  12/2015   . HX HERNIA REPAIR      Inguinal    . HX LIPOMA RESECTION      Chest   . OTHER SURGICAL HISTORY Left 01/2016    Evacuation of hematoma -Left Atrium  at [facility]    . SKIN CANCER EXCISION  06/21/2013    melanoma/ear/[facility]   . TRANSURETHRAL MICROWAVE THERAPY  05/24/2018    Dr Leandro Reasoner         allopurinoL (ZYLOPRIM) 100 mg Oral Tablet, TAKE 1 TABLET DAILY  amLODIPine (NORVASC) 10 mg Oral Tablet, TAKE 1 TABLET DAILY  ascorbic acid, vitamin C, (VITAMIN C) 500 mg Oral Tablet, 0.5 Tabs Twice daily  aspirin (ECOTRIN) 81 mg Oral Tablet, Delayed Release (E.C.), Once a day  Calcium Carbonate (OS-CAL)  650 mg calcium (1,625 mg) Oral Tablet, Every evening   cholestyramine-sucrose (QUESTRAN) 4 gram Oral Powder in Packet, 1 PACKET BY MOUTH DAILY  citalopram (CELEXA) 10 mg Oral Tablet, TAKE 1 TABLET DAILY  famotidine (PEPCID) 40 mg Oral Tablet, Take 1 Tablet (40 mg total) by mouth Twice daily for 90 days  fenofibrate nanocrystallized (TRICOR) 145 mg Oral Tablet, TAKE 1 TABLET DAILY  ferrous sulfate (FERATAB) 324 mg (65 mg iron) Oral Tablet, Delayed Release (E.C.), Take 324 mg by mouth Twice daily  KRILL OIL ORAL, Take by mouth  LUTEIN ORAL, Take by mouth  Magnesium 250 mg Oral Tablet, Take by mouth Twice daily  melatonin 1 mg Oral Tablet, As directed  multivitamin (CENTRUM SILVER) 400-250 mcg Oral Tablet, Chewable, Take 1 Tab by mouth Once a day Taking different brank with no vit K  nitroGLYCERIN (NITROSTAT) 0.4 mg Sublingual Tablet, Sublingual, 1 Tab (0.4 mg total) by Sublingual route Every 5 minutes as needed for Chest pain for 3 doses over 15 minutes  triamcinolone acetonide  (ARISTOCORT A) 0.1 % Ointment, APPLY TO AFFECTED AREA TWICE A DAY AS NEEDED  UBIDECARENONE (COQ-10 ORAL), Once a day  warfarin (COUMADIN) 10 mg Oral Tablet, Daily or as directed  Warfarin (COUMADIN) 4 mg Oral Tablet, Daily or as directed  cholestyramine-aspartame (PREVALITE) 4 gram Oral Powder in Packet, Take 4 g by mouth Every evening with dinner    No facility-administered medications prior to visit.    Allergies   Allergen Reactions   . Gluten      Celiac disease     Family Medical History:     Problem Relation (Age of Onset)    Emphysema Father    Heart Attack General Family Hx    Heart Disease Mother    Hypertension (High Blood Pressure) Mother    No Known Problems Other    Stroke Mother            Social History     Tobacco Use   . Smoking status: Never Smoker   . Smokeless tobacco: Never Used   Vaping Use   . Vaping Use: Never used   Substance Use Topics   . Alcohol use: Not Currently     Comment: rare   . Drug use: Never       Review of Systems  Review of Systems   Constitutional: Positive for malaise/fatigue and weight gain. Negative for chills and fever.   Cardiovascular: Positive for chest pain (Chronic, stable, see above). Negative for dyspnea on exertion and palpitations.   Respiratory: Negative for cough and shortness of breath.    Musculoskeletal: Negative for myalgias.   Gastrointestinal: Negative for abdominal pain, change in bowel habit and melena.   Neurological: Negative for dizziness and light-headedness.   Psychiatric/Behavioral: Negative for depression. The patient is not nervous/anxious.        Physical Exam:  BP 130/84 (Site: Right, Patient Position: Sitting, Cuff Size: Adult)   Pulse 67   Temp 36.3 C (97.4 F) (Temporal)   Ht 1.905 m (6\' 3" )   Wt 112 kg (246 lb)   SpO2 99%   BMI 30.75 kg/m       Physical Exam  Vitals and nursing note reviewed.   Constitutional:       General: He is not in acute distress.     Appearance: Normal appearance.   Neck:      Vascular: No carotid bruit.      Cardiovascular:      Rate  and Rhythm: Normal rate and regular rhythm.   Pulmonary:      Effort: Pulmonary effort is normal.      Breath sounds: Normal breath sounds. No wheezing.   Abdominal:      General: Bowel sounds are normal.      Palpations: Abdomen is soft. There is no mass.      Tenderness: There is no abdominal tenderness.   Musculoskeletal:      Cervical back: Neck supple.      Right lower leg: No edema.      Left lower leg: No edema.   Neurological:      General: No focal deficit present.      Mental Status: He is alert and oriented to person, place, and time.   Psychiatric:         Mood and Affect: Mood normal.         Assessment/Plan:    1. Anemia, unspecified type  Continue with supplements and monitor labs assure not contributing to the fatigue  - FERRITIN; Future  - IRON; Future    2. Diet-controlled diabetes mellitus (CMS [place])  Concerned control with slipping.  Will refer to diabetic education.  Repeat labs and consider Jardiance or other medications based on results  - CBC/DIFF; Future  - HGA1C (HEMOGLOBIN A1C WITH EST AVG GLUCOSE); Future  - THYROID STIMULATING HORMONE WITH FREE T4 REFLEX; Future  - Refer to [facility] Diabetes Education [facility] [place]; Future    3. Mixed hyperlipidemia  Continue with medications and work with nutrition monitor labs  - COMPREHENSIVE METABOLIC PNL, FASTING; Future  - LIPID PANEL; Future    4. Celiac disease  Continue with gluten free.  Sees GI.  Has colonoscopy scheduled for later this year.  - VITAMIN D 25 TOTAL; Future    5. BMI 30.0-30.9,adult    - Refer to [facility] [place]; Future    6. Coronary artery disease involving native coronary artery of native heart with unstable angina pectoris (CMS HCC)  Overall stable with current treatment plan but monitor labs continue with Cardiology    7. Essential hypertension  Stable with current medications no change       Orders Placed This Encounter   . CBC/DIFF   . HGA1C (HEMOGLOBIN A1C WITH EST AVG  GLUCOSE)   . COMPREHENSIVE METABOLIC PNL, FASTING   . VITAMIN D 25 TOTAL   . THYROID STIMULATING HORMONE WITH FREE T4 REFLEX   . FERRITIN   . IRON   . LIPID PANEL   . Refer to [facility] Diabetes Education [facility] [place]              Follow up: Return in about 4 months (around 05/15/2020), or if symptoms worsen or fail to improve.  Seek medical attention for new or worsening symptoms.  Patient has been seen in this clinic within the last 3 years.     [name], MD          This note was partially created using MModal Fluency Direct system (voice recognition software) and is inherently subject to errors including those of syntax and "sound-alike" substitutions which may escape proofreading.  In such instances, original meaning may be extrapolated by contextual derivation.

## 2020-01-20 ENCOUNTER — Telehealth (INDEPENDENT_AMBULATORY_CARE_PROVIDER_SITE_OTHER): Payer: Self-pay | Admitting: Family

## 2020-01-20 ENCOUNTER — Other Ambulatory Visit (HOSPITAL_BASED_OUTPATIENT_CLINIC_OR_DEPARTMENT_OTHER): Payer: Self-pay | Admitting: UROLOGY

## 2020-01-20 DIAGNOSIS — N138 Other obstructive and reflux uropathy: Secondary | ICD-10-CM

## 2020-01-20 DIAGNOSIS — N401 Enlarged prostate with lower urinary tract symptoms: Secondary | ICD-10-CM

## 2020-01-20 DIAGNOSIS — R351 Nocturia: Secondary | ICD-10-CM

## 2020-01-20 NOTE — Telephone Encounter (Signed)
I was unable to reach pt at this time. I left a voicemail asking for a call back.  Brielle Moro, LPN

## 2020-01-20 NOTE — Telephone Encounter (Signed)
Labs reviewed from PCP. Recommend low fat diet  Sheila Oats, FNP-C

## 2020-01-20 NOTE — Telephone Encounter (Signed)
Pt notified, verbalized understanding. Additional questions addressed. Encouraged pt to call back for any further questions or concerns.    Daymon Larsen, LPN [id number]

## 2020-01-20 NOTE — Telephone Encounter (Signed)
Patient called in and wanted to let Mitzi Hansen know that he had additional blood work done by his PCP.  Patient was unsure if Mitzi Hansen knew this, and if he wanted anything else done as well.  Patient said the lab work was done by [name].  If you have any questions you may reach the patient at [phone].  Thank you Francee Piccolo

## 2020-01-23 ENCOUNTER — Encounter (HOSPITAL_BASED_OUTPATIENT_CLINIC_OR_DEPARTMENT_OTHER): Payer: Self-pay | Admitting: UROLOGY

## 2020-01-31 ENCOUNTER — Telehealth (INDEPENDENT_AMBULATORY_CARE_PROVIDER_SITE_OTHER): Payer: Self-pay | Admitting: Family Medicine

## 2020-01-31 DIAGNOSIS — D649 Anemia, unspecified: Secondary | ICD-10-CM

## 2020-01-31 NOTE — Progress Notes (Signed)
Pt called me back and I let him know to take one of the iron pills Daily and to have lab work done in 1 mth.  Pt voiced understanding.  And i updated med list and placed order in the chart.     Pt states also sates that between his calcium pill and Multi vitamin he is taking 2000 iu of Vitamin D daily therefore he is going to double it up for now.   I advised him that was okay     He states that Sheila Oats advised him that his Liver enzymes were elevated and that he has hx of Fatty liver and recommends that pt follow a low fat diet.  Pt states that he will do this and he is meeting with Dietician next week.     Comments added by RLM, CMA  on 01/31/20 at 14:31.

## 2020-01-31 NOTE — Telephone Encounter (Signed)
-----   Message from [name], MD sent at 01/30/2020  8:49 PM EDT -----  Vit D a little low - make sure taking vit D 2000 IU daily - ferritin a little high would stop iron supplements for now - lipids fairly stable conitnue to watch sugars and starches and tg in diet A1c actually pretty good would not make other changes

## 2020-01-31 NOTE — Progress Notes (Signed)
Left message for pt to return call. Comments added by Harley Alto, LPN  on [id number] at [id number].

## 2020-01-31 NOTE — Telephone Encounter (Signed)
I tried calling pt and LMOM for pt to give [place] a call back.  Comments added by RLM, CMA  on 01/31/20 at 14:17.

## 2020-01-31 NOTE — Progress Notes (Signed)
[name], MD 1 hour ago (1:09 PM)  I would have him decrease the iron to just once a day and repeat cbc and iron and ferritin in a month

## 2020-01-31 NOTE — Progress Notes (Signed)
I tried calling pt and LMOM for pt to give [place] a call back.    Comments added by RLM, CMA  on 01/31/20 at 14:17.

## 2020-01-31 NOTE — Telephone Encounter (Signed)
I would have him decrease the iron to just once a day and repeat cbc and iron and ferritin in a month

## 2020-01-31 NOTE — Telephone Encounter (Signed)
Pt called back and I gave him the results.  He states that he is taking Vit D but not that much.  Therefore he will increase.     He Questions if you want him to stop the Iron Completley due to he is taking it twice a day?  Do you want him to stop or decrease to 1 a day?     He also states that he has seen he results and he and is wife question if he is anemic and if anything needs to be done about that due to he has had this in the past and he was feeling more fatigue when he had the labs done but he states that is improving.   I advised him that I would double check with Dr and see what she would like to do.       Comments added by RLM, CMA  on 01/31/20 at 10:50.

## 2020-01-31 NOTE — Progress Notes (Signed)
Pt called back and I gave him the results.  He states that he is taking Vit D but not that much.  Therefore he will increase.     He Questions if you want him to stop the Iron Completley due to he is taking it twice a day?  Do you want him to stop or decrease to 1 a day?     He also states that he has seen he results and he and is wife question if he is anemic and if anything needs to be done about that due to he has had this in the past and he was feeling more fatigue when he had the labs done but he states that is improving.   I advised him that I would double check with Dr and see what she would like to do.       Comments added by RLM, CMA  on 01/31/20 at 10:50.

## 2020-01-31 NOTE — Telephone Encounter (Signed)
Pt called me back and I let him know to take one of the iron pills Daily and to have lab work done in 1 mth.  Pt voiced understanding.  And i updated med list and placed order in the chart.     Pt states also sates that between his calcium pill and Multi vitamin he is taking 2000 iu of Vitamin D daily therefore he is going to double it up for now.   I advised him that was okay     He states that Sheila Oats advised him that his Liver enzymes were elevated and that he has hx of Fatty liver and recommends that pt follow a low fat diet.  Pt states that he will do this and he is meeting with Dietician next week.     Comments added by RLM, CMA  on 01/31/20 at 14:31.

## 2020-02-03 ENCOUNTER — Ambulatory Visit (HOSPITAL_BASED_OUTPATIENT_CLINIC_OR_DEPARTMENT_OTHER): Payer: Self-pay | Admitting: Cardiovascular Disease

## 2020-02-03 ENCOUNTER — Other Ambulatory Visit: Payer: Self-pay

## 2020-02-03 ENCOUNTER — Encounter (HOSPITAL_BASED_OUTPATIENT_CLINIC_OR_DEPARTMENT_OTHER): Payer: Self-pay | Admitting: Cardiovascular Disease

## 2020-02-03 ENCOUNTER — Ambulatory Visit (INDEPENDENT_AMBULATORY_CARE_PROVIDER_SITE_OTHER): Payer: Medicare Other | Admitting: Cardiovascular Disease

## 2020-02-03 VITALS — BP 100/72 | HR 69 | Ht 74.6 in | Wt 237.4 lb

## 2020-02-03 DIAGNOSIS — E782 Mixed hyperlipidemia: Secondary | ICD-10-CM

## 2020-02-03 DIAGNOSIS — I480 Paroxysmal atrial fibrillation: Secondary | ICD-10-CM | POA: Insufficient documentation

## 2020-02-03 DIAGNOSIS — I10 Essential (primary) hypertension: Secondary | ICD-10-CM

## 2020-02-03 DIAGNOSIS — Z86718 Personal history of other venous thrombosis and embolism: Secondary | ICD-10-CM

## 2020-02-03 DIAGNOSIS — I25110 Atherosclerotic heart disease of native coronary artery with unstable angina pectoris: Secondary | ICD-10-CM

## 2020-02-03 DIAGNOSIS — I2699 Other pulmonary embolism without acute cor pulmonale: Secondary | ICD-10-CM

## 2020-02-03 MED ORDER — FERROUS SULFATE 324 MG (65 MG IRON) TABLET,DELAYED RELEASE
324.0000 mg | DELAYED_RELEASE_TABLET | Freq: Every day | ORAL | 0 refills | Status: DC
Start: 2020-02-03 — End: 2023-05-03

## 2020-02-03 NOTE — Progress Notes (Signed)
CARDIOLOGY CLINIC, MEDICAL OFFICE [facility]  [place]  [place] [address]  Phone: [phone]  Fax: [phone]    Encounter Date: 02/03/2020    Patient ID:  Dustin Webster  YTK:[id number]    DOB: November 17, 1955  Age: 64 y.o. male    Subjective:     Chief Complaint   Patient presents with   . Follow Up 6 Months     HPI   This is a 64 year old male who I follow in the office.  Patient has a history of coronary artery disease and is status post coronary artery bypass grafting.  He did have a repeat catheterization due to hypotensive response on the treadmill which showed his grafts to be patent in June of 2019. The patient states he has had some increased fatigue but attributes this to some anemia that he has.  He states his liver function tests are also mildly elevated.  He does deny any recent significant chest pain, increased shortness of breath, palpitations, orthopnea, or PND.  He did have a history of postoperative atrial fibrillation and is maintained on warfarin.  The patient did have a large hematoma and was urgently transferred to [facility] in the past.  This was extracted.  The patient then had DVTs and PE.      Current Outpatient Medications   Medication Sig   . allopurinoL (ZYLOPRIM) 100 mg Oral Tablet TAKE 1 TABLET DAILY   . amLODIPine (NORVASC) 10 mg Oral Tablet TAKE 1 TABLET DAILY   . ascorbic acid, vitamin C, (VITAMIN C) 500 mg Oral Tablet 500 mg Once a day    . aspirin (ECOTRIN) 81 mg Oral Tablet, Delayed Release (E.C.) Once a day   . Calcium Carbonate (OS-CAL) 650 mg calcium (1,625 mg) Oral Tablet Every evening    . Cholecalciferol, Vitamin D3, (VITAMIN D-3) 50 mcg (2,000 unit) Oral Capsule Take by mouth   . cholestyramine-sucrose (QUESTRAN) 4 gram Oral Powder in Packet 1 PACKET BY MOUTH DAILY   . citalopram (CELEXA) 10 mg Oral Tablet TAKE 1 TABLET DAILY   . famotidine (PEPCID) 40 mg Oral Tablet Take 1 Tablet (40 mg total) by mouth Twice daily for 90 days   . fenofibrate  nanocrystallized (TRICOR) 145 mg Oral Tablet TAKE 1 TABLET DAILY   . ferrous sulfate (FERATAB) 324 mg (65 mg iron) Oral Tablet, Delayed Release (E.C.) Take 1 Tablet (324 mg total) by mouth Once a day   . KRILL OIL ORAL Take by mouth   . LUTEIN ORAL Take by mouth   . Magnesium 250 mg Oral Tablet Take 500 mg by mouth Once a day    . melatonin 1 mg Oral Tablet As directed   . multivitamin (CENTRUM SILVER) 400-250 mcg Oral Tablet, Chewable Take 1 Tab by mouth Once a day Taking different brank with no vit K   . nitroGLYCERIN (NITROSTAT) 0.4 mg Sublingual Tablet, Sublingual 1 Tab (0.4 mg total) by Sublingual route Every 5 minutes as needed for Chest pain for 3 doses over 15 minutes   . triamcinolone acetonide (ARISTOCORT A) 0.1 % Ointment APPLY TO AFFECTED AREA TWICE A DAY AS NEEDED   . UBIDECARENONE (COQ-10 ORAL) Once a day   . warfarin (COUMADIN) 10 mg Oral Tablet Daily or as directed   . Warfarin (COUMADIN) 4 mg Oral Tablet Daily or as directed     Allergies   Allergen Reactions   . Gluten      Celiac disease     Past Medical  History:   Diagnosis Date   . Anemia    . Atrial fibrillation (CMS HCC)    . BPH associated with nocturia    . CAD (coronary artery disease)    . Chronic depression 05/23/2018   . CKD (chronic kidney disease)    . Diet-controlled diabetes mellitus (CMS [place]) 05/23/2018   . DVT (deep venous thrombosis) (CMS HCC)    . Gastroesophageal reflux disease without esophagitis 05/23/2018   . Insomnia 05/23/2018   . Malignant melanoma of right ear (CMS [place]) 05/23/2018   . Pericardial effusion    . Pulmonary emboli (CMS [facility])          Past Surgical History:   Procedure Laterality Date   . HX CHOLECYSTECTOMY     . HX CORONARY ARTERY BYPASS GRAFT     . HX HEART SURGERY  12/2015   . HX HERNIA REPAIR      Inguinal    . HX LIPOMA RESECTION      Chest   . OTHER SURGICAL HISTORY Left 01/2016    Evacuation of hematoma -Left Atrium  at [facility]    . SKIN CANCER EXCISION  06/21/2013    melanoma/ear/[facility]   . TRANSURETHRAL MICROWAVE  THERAPY  05/24/2018    Dr Leandro Reasoner         Family Medical History:     Problem Relation (Age of Onset)    Emphysema Father    Heart Attack General Family Hx    Heart Disease Mother    Hypertension (High Blood Pressure) Mother    No Known Problems Other    Stroke Mother            Social History     Tobacco Use   . Smoking status: Never Smoker   . Smokeless tobacco: Never Used   Vaping Use   . Vaping Use: Never used   Substance Use Topics   . Alcohol use: Not Currently     Comment: rare   . Drug use: Never       Review of Systems   Constitutional: Positive for fatigue. Negative for activity change, appetite change, chills, diaphoresis, fever and unexpected weight change.   HENT: Negative for congestion, dental problem, drooling, ear discharge, ear pain, facial swelling, hearing loss, mouth sores, nosebleeds, postnasal drip, rhinorrhea, sinus pressure, sinus pain, sneezing, sore throat, tinnitus, trouble swallowing and voice change.    Eyes: Negative for photophobia, pain, discharge, redness, itching and visual disturbance.   Respiratory: Negative for apnea, cough, choking, chest tightness, shortness of breath, wheezing and stridor.    Cardiovascular: Negative for chest pain, palpitations and leg swelling.   Gastrointestinal: Negative for abdominal distention, abdominal pain, anal bleeding, blood in stool, constipation, diarrhea, nausea, rectal pain and vomiting.   Endocrine: Negative for cold intolerance, heat intolerance, polydipsia, polyphagia and polyuria.   Genitourinary: Negative for decreased urine volume, difficulty urinating, discharge, dysuria, enuresis, flank pain, frequency, genital sores, hematuria, penile pain, penile swelling, scrotal swelling, testicular pain and urgency.   Musculoskeletal: Negative for arthralgias, back pain, gait problem, joint swelling, myalgias, neck pain and neck stiffness.   Skin: Negative.  Negative for color change, pallor, rash and wound.   Allergic/Immunologic: Negative for  environmental allergies, food allergies and immunocompromised state.   Neurological: Negative for dizziness, tremors, seizures, syncope, facial asymmetry, speech difficulty, light-headedness, numbness and headaches.   Hematological: Negative for adenopathy. Does not bruise/bleed easily.   Psychiatric/Behavioral: Negative for agitation, behavioral problems, confusion, decreased concentration, dysphoric mood, hallucinations, self-injury,  sleep disturbance and suicidal ideas. The patient is not nervous/anxious and is not hyperactive.      Objective:   Vitals: BP 100/72 Comment: RTA  Pulse 69   Ht 1.895 m (6' 2.6")   Wt 108 kg (237 lb 6.4 oz)   SpO2 99%   BMI 29.99 kg/m         Physical Exam  Constitutional:       General: He is not in acute distress.     Appearance: He is not diaphoretic.   HENT:      Head: Normocephalic and atraumatic.   Eyes:      Conjunctiva/sclera: Conjunctivae normal.      Pupils: Pupils are equal, round, and reactive to light.   Cardiovascular:      Rate and Rhythm: Normal rate and regular rhythm.      Heart sounds: Normal heart sounds. No murmur heard.   No friction rub. No gallop.    Pulmonary:      Effort: Pulmonary effort is normal. No respiratory distress.      Breath sounds: Normal breath sounds. No wheezing or rales.   Chest:      Chest wall: No tenderness.   Abdominal:      General: Bowel sounds are normal. There is no distension.      Palpations: Abdomen is soft.      Tenderness: There is no abdominal tenderness. There is no rebound.   Musculoskeletal:         General: Normal range of motion.      Cervical back: Normal range of motion and neck supple.   Skin:     General: Skin is warm and dry.   Neurological:      Mental Status: He is alert and oriented to person, place, and time.   Psychiatric:         Judgment: Judgment normal.         Assessment & Plan:     ENCOUNTER DIAGNOSES     ICD-10-CM   1. Coronary artery disease involving native coronary artery of native heart with  unstable angina pectoris (CMS HCC)  I25.110   2. History of DVT of lower extremity  Z86.718   3. Essential hypertension  I10   4. Mixed hyperlipidemia  E78.2   5. Pulmonary embolism, unspecified chronicity, unspecified pulmonary embolism type, unspecified whether acute cor pulmonale present (CMS HCC)  I26.99   6. Paroxysmal atrial fibrillation (CMS HCC)  I48.0       1. Coronary artery disease:  The patient is status post coronary artery bypass grafting.  He did have a catheterization performed in June of 2019 that showed patent grafts.  The patient continue with risk factor modification and medical therapy.    2. History of DVT and PE:  The patient continue warfarin with goal INR of 2-3.  His INR today in the office was 2.5.    3. Postoperative atrial fibrillation:  The patient has had no known recurrence of his atrial fibrillation.    4. Hyperlipidemia:  The patient is intolerant of statin therapy.  He does continue on Tricor.    5. Hypertension:  The patient continue on his current antihypertensive medications.  He states his blood pressure has been under good control.    We will see him back in 6 months, sooner if needed.    Return in about 6 months (around 08/05/2020) for np.    Landry Corporal, MD

## 2020-02-04 ENCOUNTER — Ambulatory Visit: Payer: Medicare Other | Attending: Family Medicine

## 2020-02-04 DIAGNOSIS — Z6830 Body mass index (BMI) 30.0-30.9, adult: Secondary | ICD-10-CM | POA: Insufficient documentation

## 2020-02-04 DIAGNOSIS — E119 Type 2 diabetes mellitus without complications: Secondary | ICD-10-CM | POA: Insufficient documentation

## 2020-02-04 NOTE — Progress Notes (Signed)
S-   O- Diabetes Education - Initial Assessment  A- 64 year old male referred by Dr. Julien Girt for Type 2 DM education. His most recent A1C was 6.3% but the previous one 6 months before that was 6.7%.  Prior to that, his levels were in the pre diabetes range.He was not perscribed any diabetes medication yet. He says his fasting glucose average on his meter at home is about 140mg /dl and f he has checked it after a meal it is usually about an hour after and it has been 200mg /dl.  He has a lot of medical issues that food can influence so he really needs some meal planning advice.  He has celiac disease and gout and osteoporosis and CAD and fatty liver and kidney disease and high blood pressure. He viewed the DVD's What is Diabetes and Everyone Can carb count and we discussed blood sugar and A1C goals as well as meal planning guidelines with emphasis on portion control,  measuring and counting and label reading and using the food list and fast food guide provided.  I suggested he keep a detailed food diary for two weeks showing what he would normally eat before coming to learn today and then keep a detailed diary for two weeks after making some changes. Understanding verbalized.  I would like to see him a month after that.  P- Return as scheduled.    Nursing Notes:   Shona Simpson, RN  02/04/20 1108  Signed     02/04/20 1100   What is Diabetes and Treatment Options   Identify as a chronic order of metabolism 1 - Needs Instruction   Identify the pancreas makes insulin 1 - Needs Instruction   State normal blood sugar level 1 - Needs Instruction   Define hyperglycemia and symptoms 1 - Needs Instruction   Identify which type of diabetes they have 1 - Needs Instruction   List 3 factors which may contribute to development of DM 1 - Needs Instruction   Identify as a life long condition 1 - Needs Instruction   State self care is important 1 - Needs Instruction   List 3 Compnents of treatment 1 - Needs Instruction      Nutrition Management    State most important reason to use a meal plan 1 - Needs Instructions   State how meal spacing helps blood sugar control 1 - Needs Instruction   State how composition of meals affect blood sugar 1 - Needs Instruction   Describe how to keep a food diary 1 - Needs Instruction   State how monitoring amount of food can help reach blood sugar levels 1 - Needs Instruction   Describe personal meal plan 1 - Needs Instruction   Use meal plan to plan meals 1 - Needs Instruction   Use meal plan when eating away from home 1 - Needs Instruction   Identify behaviors to help control weight 1 - Needs Instruction   Use food labels to choose food 1 - Needs Instruction   Discuss benefits/cons of fiber 1 - Needs Instruction   Plan eating/behavior changes working toward personal goals 1 - Needs Instruction   Identify behaviors to help reduce risk of heart disease 1 - Needs Instruction   MEDICATIONS   Define purpose and action of medications 1 - Needs Instruction   State name, dose, and time to take 1 - Needs Instruction   Describe potential side effects 1 - Needs Instruction   MONITORING AND USING RESULTS TO IMPROVE  CONTROL   State when to test blood glucose and urine keytones 1 - Needs Instruction   Explain importance of recording results 1 - Needs Instruction   Perform monitoring correctly 2 - Needs Review/Assistance   Define usefulness of these tests in blood glucose regulation 1 - Needs Instruction   Describe safe lancet/syringe disposal 1 - Needs Instruction   Define personal blood glucose goals 1 - Needs Instruction   State importance of HGBA1C 1 - Needs Instruction   Define normal fasting levels 1 - Needs Instruction

## 2020-02-04 NOTE — Nursing Note (Signed)
02/04/20 1100   What is Diabetes and Treatment Options   Identify as a chronic order of metabolism 1 - Needs Instruction   Identify the pancreas makes insulin 1 - Needs Instruction   State normal blood sugar level 1 - Needs Instruction   Define hyperglycemia and symptoms 1 - Needs Instruction   Identify which type of diabetes they have 1 - Needs Instruction   List 3 factors which may contribute to development of DM 1 - Needs Instruction   Identify as a life long condition 1 - Needs Instruction   State self care is important 1 - Needs Instruction   List 3 Compnents of treatment 1 - Needs Instruction   Nutrition Management    State most important reason to use a meal plan 1 - Needs Instructions   State how meal spacing helps blood sugar control 1 - Needs Instruction   State how composition of meals affect blood sugar 1 - Needs Instruction   Describe how to keep a food diary 1 - Needs Instruction   State how monitoring amount of food can help reach blood sugar levels 1 - Needs Instruction   Describe personal meal plan 1 - Needs Instruction   Use meal plan to plan meals 1 - Needs Instruction   Use meal plan when eating away from home 1 - Needs Instruction   Identify behaviors to help control weight 1 - Needs Instruction   Use food labels to choose food 1 - Needs Instruction   Discuss benefits/cons of fiber 1 - Needs Instruction   Plan eating/behavior changes working toward personal goals 1 - Needs Instruction   Identify behaviors to help reduce risk of heart disease 1 - Needs Instruction   MEDICATIONS   Define purpose and action of medications 1 - Needs Instruction   State name, dose, and time to take 1 - Needs Instruction   Describe potential side effects 1 - Needs Instruction   MONITORING AND USING RESULTS TO [place] when to test blood glucose and urine keytones 1 - Needs Instruction   Explain importance of recording results 1 - Needs Instruction   Perform monitoring correctly 2 - Needs  Review/Assistance   Define usefulness of these tests in blood glucose regulation 1 - Needs Instruction   Describe safe lancet/syringe disposal 1 - Needs Instruction   Define personal blood glucose goals 1 - Needs Instruction   State importance of HGBA1C 1 - Needs Instruction   Define normal fasting levels 1 - Needs Instruction

## 2020-03-04 ENCOUNTER — Encounter (INDEPENDENT_AMBULATORY_CARE_PROVIDER_SITE_OTHER): Payer: Self-pay | Admitting: Family Medicine

## 2020-03-06 ENCOUNTER — Ambulatory Visit (INDEPENDENT_AMBULATORY_CARE_PROVIDER_SITE_OTHER): Payer: Medicare Other

## 2020-03-06 ENCOUNTER — Other Ambulatory Visit: Payer: Self-pay

## 2020-03-06 DIAGNOSIS — Z86718 Personal history of other venous thrombosis and embolism: Secondary | ICD-10-CM

## 2020-03-06 DIAGNOSIS — I2699 Other pulmonary embolism without acute cor pulmonale: Secondary | ICD-10-CM

## 2020-03-06 LAB — POCT INR (RESULTS): POCT INR: 2.5

## 2020-03-12 ENCOUNTER — Ambulatory Visit: Payer: Medicare Other | Attending: Family Medicine

## 2020-03-12 DIAGNOSIS — E119 Type 2 diabetes mellitus without complications: Secondary | ICD-10-CM | POA: Insufficient documentation

## 2020-03-12 DIAGNOSIS — Z6830 Body mass index (BMI) 30.0-30.9, adult: Secondary | ICD-10-CM | POA: Insufficient documentation

## 2020-03-13 NOTE — Nursing Note (Signed)
03/13/20 0800   Nutrition Management    State most important reason to use a meal plan 3 - Verbalizes/Demonstrates competency   State how meal spacing helps blood sugar control 3 - Verbalizes/Demonstrates Competency   State how composition of meals affect blood sugar 2 - Needs Review/Assistance   Describe how to keep a food diary 2 - Needs Review/Assistance   State how monitoring amount of food can help reach blood sugar levels 3 - Verbalizes/Demonstrates Competency   Describe personal meal plan 2 - Needs Review/Assistance   Use meal plan to plan meals 2 - Needs Review/Assistance   Use meal plan when eating away from home 2 - Needs Review/Assistance   Identify behaviors to help control weight 2 - Needs Review/Assistance   Explain how alcohol affects blood sugar 1 - Needs Instruction   Use food labels to choose food 2 - Needs Review/Assistance   Discuss benefits/cons of fiber 2 - Needs Review/Assistance   Plan eating/behavior changes working toward personal goals 2 - Needs Review/Assistance   Identify behaviors to help reduce risk of heart disease 2 - Needs Review/Assistance

## 2020-03-13 NOTE — Progress Notes (Signed)
Assessment: Pt is 64 yo male here per [place] due to T2DM with HbA1c of 6.3% - 01/14/20 which is down from 6.7%. PMH: celiac disease, gout, osteoporosis, CAD, fatty liver, kidney disease- GFR 55, HBP. Wife comes to meeting. Patient reports no flare up from gout- well controlled on meds and didn't want to limit purines in diet. Reported that GI doctor has not mentioned diet with CKD and GFR ranges 45-55. Patient wanted to focus on a carb controlled, gluten free, low fat diet.     B- eggs, [place] bacon, ham, hot dogs, sausage- rare  L- bowl of soup, leftovers from dinner, salads, potato soup and grits  D- meat- chicken, hamburger, fish, pork chops, fruit salad, vegetables  S- Nuts- almonds    Walks 25-78min. 4-5x weekly.     Ht: 6'2.6"    Wt: 235lb    Estimated Nutrient Needs: Not reviewed  IBW: 190lb             ABW: 201lb (91kg)  Calories:  2275          kcal per day ( 25     Kcal/ 91        kg   ABW    )  Protein:    73          g per day (0.8          g/       91        kg   ABW       )  Fiber: 10-15g per day  Fat:      38-63              g per day (0.15% - 0.25% of total    2275       kcal per day)  Water:    2275            ml per day ( 25       ml/         91        kg   ABW       )    Intervention:  Diabetes &/or Weight Loss:     Education focused on assessment of goals and making a food list of safe to eat foods. Encouraged protein and fiber at each meal with food balance formula. Plan is a gluten free, low fat diet, and carb controlled.     White Board Discussion:   HBA1c and Blood sugar reduction  Individualized meal planning    Handouts with discussion:   Food Balance Packet- carbohydrate counting, protein, fat, fiber  Gut Health Salad  Nutrition label / Portion Control  Daily Food Records   Master Nutrient Food List    Monitor/Evaluation:     Personal Goals:   1) 45-60g carbs 3x daily with optional 3x snacks at 20-30g carbs.   2) Continue to avoid gluten.   3) Pair each meal with protein and fiber source with  food balance formula.    1 month F/u:  Discuss low fat diet plan at next visit.     Nursing Notes:   Madison Hickman, Catha Nottingham  03/13/20 [id number]  Signed     03/13/20 0800   Nutrition Management    State most important reason to use a meal plan 3 - Verbalizes/Demonstrates competency   State how meal spacing helps blood sugar control 3 - Verbalizes/Demonstrates Competency   State how composition of meals affect blood sugar  2 - Needs Review/Assistance   Describe how to keep a food diary 2 - Needs Review/Assistance   State how monitoring amount of food can help reach blood sugar levels 3 - Verbalizes/Demonstrates Competency   Describe personal meal plan 2 - Needs Review/Assistance   Use meal plan to plan meals 2 - Needs Review/Assistance   Use meal plan when eating away from home 2 - Needs Review/Assistance   Identify behaviors to help control weight 2 - Needs Review/Assistance   Explain how alcohol affects blood sugar 1 - Needs Instruction   Use food labels to choose food 2 - Needs Review/Assistance   Discuss benefits/cons of fiber 2 - Needs Review/Assistance   Plan eating/behavior changes working toward personal goals 2 - Needs Review/Assistance   Identify behaviors to help reduce risk of heart disease 2 - Needs Review/Assistance

## 2020-04-03 ENCOUNTER — Encounter (HOSPITAL_BASED_OUTPATIENT_CLINIC_OR_DEPARTMENT_OTHER): Payer: Self-pay

## 2020-04-07 ENCOUNTER — Ambulatory Visit (INDEPENDENT_AMBULATORY_CARE_PROVIDER_SITE_OTHER): Payer: Medicare Other

## 2020-04-07 ENCOUNTER — Other Ambulatory Visit: Payer: Self-pay

## 2020-04-07 DIAGNOSIS — Z86718 Personal history of other venous thrombosis and embolism: Secondary | ICD-10-CM

## 2020-04-07 LAB — POCT INR (RESULTS): POCT INR: 2.6 (ref 2–3)

## 2020-04-08 ENCOUNTER — Encounter (HOSPITAL_BASED_OUTPATIENT_CLINIC_OR_DEPARTMENT_OTHER): Payer: Self-pay | Admitting: UROLOGY

## 2020-04-14 ENCOUNTER — Other Ambulatory Visit (HOSPITAL_BASED_OUTPATIENT_CLINIC_OR_DEPARTMENT_OTHER): Payer: Self-pay | Admitting: UROLOGY

## 2020-04-14 DIAGNOSIS — N138 Other obstructive and reflux uropathy: Secondary | ICD-10-CM

## 2020-04-14 DIAGNOSIS — N401 Enlarged prostate with lower urinary tract symptoms: Secondary | ICD-10-CM

## 2020-04-15 ENCOUNTER — Encounter (HOSPITAL_BASED_OUTPATIENT_CLINIC_OR_DEPARTMENT_OTHER): Payer: Self-pay | Admitting: UROLOGY

## 2020-04-15 ENCOUNTER — Ambulatory Visit (HOSPITAL_BASED_OUTPATIENT_CLINIC_OR_DEPARTMENT_OTHER): Payer: Medicare Other

## 2020-04-15 ENCOUNTER — Other Ambulatory Visit: Payer: Self-pay

## 2020-04-15 ENCOUNTER — Ambulatory Visit: Payer: Medicare Other | Attending: UROLOGY | Admitting: UROLOGY

## 2020-04-15 VITALS — BP 124/76 | Ht 74.0 in | Wt 226.0 lb

## 2020-04-15 DIAGNOSIS — N138 Other obstructive and reflux uropathy: Secondary | ICD-10-CM | POA: Insufficient documentation

## 2020-04-15 DIAGNOSIS — N401 Enlarged prostate with lower urinary tract symptoms: Secondary | ICD-10-CM

## 2020-04-15 LAB — URINALYSIS, MACROSCOPIC
BILIRUBIN: NOT DETECTED mg/dL
BLOOD: NOT DETECTED mg/dL
GLUCOSE: NOT DETECTED mg/dL
KETONES: NOT DETECTED mg/dL
LEUKOCYTES: NOT DETECTED WBCs/uL
NITRITE: NOT DETECTED
PH: 6.5 (ref 5.0–8.0)
PROTEIN: NOT DETECTED mg/dL
SPECIFIC GRAVITY: 1.015 (ref 1.005–1.030)
UROBILINOGEN: 0.2 mg/dL

## 2020-04-15 LAB — PSA, DIAGNOSTIC: PSA: 0.2 ng/mL (ref <=4.00)

## 2020-04-15 NOTE — H&P (Signed)
[facility]   [address]   [place], [place] [address]    Urology, Medical Office Building [facility]  [address]  [place] [address]  [phone]    Date: 04/15/2020  Name: Dustin Webster  Age: 64 y.o.      Chief of Complaint:   Chief Complaint   Patient presents with   . Benign Prostatic Hypertrophy     Pt here for yearly check up.  Last Cysto was 02/2018.  PSA- 01-22-2019 ( 0.131).  Pt still c/o painful ejaculation.       Dustin Webster is a 64 y.o. male  who presents to the practice today for follow-up on his history of BPH and prostatism.  He underwent TUMT 7/19.  Last PSA was approximately year ago was 0.131.  He reports he is voiding well he does have some discomfort with ejaculation.        Patient Active Problem List    Diagnosis Date Noted   . Paroxysmal atrial fibrillation (CMS HCC) 02/03/2020   . Change in bowel habits 12/19/2019   . Anemia, unspecified type 12/19/2019   . Gastroesophageal reflux disease, unspecified whether esophagitis present 12/19/2019   . BPH with obstruction/lower urinary tract symptoms 07/11/2018   . Hypertrophy of prostate 05/23/2018   . 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 Diet-controlled diabetes mellitus (CMS [place]) 05/23/2018   . Gastroesophageal reflux disease without esophagitis 05/23/2018   . Chronic depression 05/23/2018   . History of DVT of lower extremity 05/23/2018   . External hemorrhoid 05/23/2018   . Leg cramps, sleep related 05/23/2018   . Insomnia 05/23/2018   . Allergic rhinitis 05/23/2018   . History of open heart surgery 05/23/2018   . Unstable angina (CMS HCC) 05/07/2018   . Chest pain 05/07/2018   . Anemia    . Pulmonary emboli (CMS HCC) 01/14/2017   . Mixed hyperlipidemia 01/14/2017   . HTN (hypertension) 01/16/2016   . Pericardial effusion 01/16/2016   . Benign prostatic hyperplasia 01/02/2016   . Chronic gout 01/02/2016   . CAD (coronary artery disease), native coronary artery 12/26/2015   . Celiac disease 12/26/2015   . VTE (venous  thromboembolism) 12/26/2015         Past Medical History:   Diagnosis Date   . Anemia    . Atrial fibrillation (CMS HCC)    . BPH associated with nocturia    . CAD (coronary artery disease)    . Chronic depression 05/23/2018   . CKD (chronic kidney disease)    . Diet-controlled diabetes mellitus (CMS [place]) 05/23/2018   . DVT (deep venous thrombosis) (CMS HCC)    . Gastroesophageal reflux disease without esophagitis 05/23/2018   . Insomnia 05/23/2018   . Malignant melanoma of right ear (CMS [facility]) 05/23/2018   . Pericardial effusion    . Pulmonary emboli (CMS [facility])          Past Surgical History:   Procedure Laterality Date   . HX CHOLECYSTECTOMY     . HX CORONARY ARTERY BYPASS GRAFT     . HX HEART SURGERY  12/2015   . HX HERNIA REPAIR      Inguinal    . HX LIPOMA RESECTION      Chest   . OTHER SURGICAL HISTORY Left 01/2016    Evacuation of hematoma -Left Atrium  at [facility]    . SKIN CANCER EXCISION  06/21/2013    melanoma/ear/[facility]   .  TRANSURETHRAL MICROWAVE THERAPY  05/24/2018    Dr Leandro Reasoner         Allergies   Allergen Reactions   . Gluten      Celiac disease     Current Outpatient Medications   Medication Sig Dispense Refill   . allopurinoL (ZYLOPRIM) 100 mg Oral Tablet TAKE 1 TABLET DAILY 90 Tab 3   . amLODIPine (NORVASC) 10 mg Oral Tablet TAKE 1 TABLET DAILY 90 Tab 3   . ascorbic acid, vitamin C, (VITAMIN C) 500 mg Oral Tablet 500 mg Once a day      . aspirin (ECOTRIN) 81 mg Oral Tablet, Delayed Release (E.C.) Once a day     . Calcium Carbonate (OS-CAL) 650 mg calcium (1,625 mg) Oral Tablet Every evening      . Cholecalciferol, Vitamin D3, (VITAMIN D-3) 50 mcg (2,000 unit) Oral Capsule Take by mouth     . cholestyramine-sucrose (QUESTRAN) 4 gram Oral Powder in Packet 1 PACKET BY MOUTH DAILY 90 Packet 1   . citalopram (CELEXA) 10 mg Oral Tablet TAKE 1 TABLET DAILY 90 Tab 3   . fenofibrate nanocrystallized (TRICOR) 145 mg Oral Tablet TAKE 1 TABLET DAILY 90 Tab 3   . ferrous sulfate (FERATAB) 324 mg (65 mg iron) Oral Tablet,  Delayed Release (E.C.) Take 1 Tablet (324 mg total) by mouth Once a day 1 Tablet 0   . KRILL OIL ORAL Take by mouth     . LUTEIN ORAL Take by mouth     . Magnesium 250 mg Oral Tablet Take 500 mg by mouth Once a day      . melatonin 1 mg Oral Tablet As directed     . multivitamin (CENTRUM SILVER) 400-250 mcg Oral Tablet, Chewable Take 1 Tab by mouth Once a day Taking different brank with no vit K     . nitroGLYCERIN (NITROSTAT) 0.4 mg Sublingual Tablet, Sublingual 1 Tab (0.4 mg total) by Sublingual route Every 5 minutes as needed for Chest pain for 3 doses over 15 minutes 25 Tab 3   . triamcinolone acetonide (ARISTOCORT A) 0.1 % Ointment APPLY TO AFFECTED AREA TWICE A DAY AS NEEDED     . UBIDECARENONE (COQ-10 ORAL) Once a day     . warfarin (COUMADIN) 10 mg Oral Tablet Daily or as directed 90 Tab 3   . Warfarin (COUMADIN) 4 mg Oral Tablet Daily or as directed 90 Tab 3     No current facility-administered medications for this visit.      Family Medical History:     Problem Relation (Age of Onset)    Emphysema Father    Heart Attack General Family Hx    Heart Disease Mother    Hypertension (High Blood Pressure) Mother    No Known Problems Other    Stroke Mother            Social History     Socioeconomic History   . Marital status: Married     Spouse name: Levada Dy   . Number of children: 1   . Years of education: Not on file   . Highest education level: Not on file   Occupational History   . Occupation: Other Land     Comment: Retired   Tobacco Use   . Smoking status: Never Smoker   . Smokeless tobacco: Never Used   Vaping Use   . Vaping Use: Never used   Substance and Sexual Activity   . Alcohol use: Not Currently  Comment: rare   . Drug use: Never   . Sexual activity: Yes     Partners: Female   Other Topics Concern   . Not on file   Social History Narrative   . Not on file     Social Determinants of Health     Financial Resource Strain:    . Difficulty of Paying Living Expenses:    Food Insecurity:     . Worried About Charity fundraiser in the Last Year:    . Arboriculturist in the Last Year:    Transportation Needs:    . Film/video editor (Medical):    Marland Kitchen Lack of Transportation (Non-Medical):    Physical Activity:    . Days of Exercise per Week:    . Minutes of Exercise per Session:    Stress:    . Feeling of Stress :    Intimate Partner Violence:    . Fear of Current or Ex-Partner:    . Emotionally Abused:    Marland Kitchen Physically Abused:    . Sexually Abused:         ROS: A 10 point review of systems is negative except for the HPI.    OBJECTIVE:   BP 124/76   Ht 1.88 m (6\' 2" )   Wt 103 kg (226 lb)   BMI 29.02 kg/m        Body mass index is 29.02 kg/m.     Exam:    GENERAL: Alert and Oriented, No Apparent Distress  ABDOMEN: Soft, NT, Non-distended, No Masses  EXTERNAL GENITALIA: Both testicles appear normal with no masses, Penis has no lesions  ANORECTAL EXAM:  Prostate 2+ symmetric nontender no nodules  EXTREMITIES: No clubbing, cyanosis, or edema    Lab Results   Component Value Date/Time    COLOR Yellow 04/15/2020 10:55 AM    SPECGRAVUR 1.015 04/15/2020 10:55 AM    PHURINE 6.5 04/15/2020 10:55 AM    PROTEIN Not Detected 04/15/2020 10:55 AM    GLUCOSE Not Detected 04/15/2020 10:55 AM    KETONES Not Detected 04/15/2020 10:55 AM    UROBILINOGEN 0.2  04/15/2020 10:55 AM    LEUKOCYTES Not Detected 04/15/2020 10:55 AM    NITRITE Not Detected 04/15/2020 10:55 AM    WBC 3.6 01/14/2020 12:08 PM    WBC 3.6 01/14/2020 12:08 PM    RBC 3.99 (L) 01/14/2020 12:08 PM    BILIRUBIN Not Detected 04/15/2020 10:55 AM    HGBURINE Not Detected 04/15/2020 10:55 AM          ASSESSMENT & PLAN:       ICD-10-CM    1. BPH with obstruction/lower urinary tract symptoms  N40.1 PSA, DIAGNOSTIC    N13.8      Return in about 1 year (around 04/15/2021).    We will obtain a PSA today.  I will see him back in 1 year.         This note may have been fully or partially generated using MModal Fluency Direct system, and there may be some incorrect  words, spellings, and punctuation that were not noted in checking the note before saving.    Graylon Gunning, MD  04/15/2020, 11:25

## 2020-04-21 ENCOUNTER — Encounter (INDEPENDENT_AMBULATORY_CARE_PROVIDER_SITE_OTHER): Payer: Self-pay | Admitting: Family Medicine

## 2020-04-22 ENCOUNTER — Other Ambulatory Visit (INDEPENDENT_AMBULATORY_CARE_PROVIDER_SITE_OTHER): Payer: Self-pay | Admitting: Family Medicine

## 2020-04-22 DIAGNOSIS — I10 Essential (primary) hypertension: Secondary | ICD-10-CM

## 2020-04-22 DIAGNOSIS — E782 Mixed hyperlipidemia: Secondary | ICD-10-CM

## 2020-04-22 DIAGNOSIS — K219 Gastro-esophageal reflux disease without esophagitis: Secondary | ICD-10-CM

## 2020-04-22 MED ORDER — AMLODIPINE 10 MG TABLET
10.00 mg | ORAL_TABLET | Freq: Every day | ORAL | 1 refills | Status: DC
Start: 2020-04-22 — End: 2020-09-16

## 2020-04-22 MED ORDER — FAMOTIDINE 40 MG TABLET
40.00 mg | ORAL_TABLET | Freq: Two times a day (BID) | ORAL | 1 refills | Status: DC
Start: 2020-04-22 — End: 2020-09-16

## 2020-04-22 NOTE — Telephone Encounter (Signed)
-----   Message from Ashby Dawes. Brockmann sent at 04/21/2020  4:08 PM EDT -----  Regarding: Prescription Question  Contact: [phone]  Please send in prescriptions for the following.       1) Amlodipine Besylate  10mg  once daily.  I just ran out and realized the last bottle I had was from my old pharmacy .  Need prescription with current [employer].    2) famotidine 40mg  -  I have no more refills and am close to running out.     I found out I had some blood work waiting for me.  Do you want me to wait until my appointment in July or get it done now?  Hope you are having a great summer.

## 2020-04-22 NOTE — Telephone Encounter (Signed)
Called patient, see he was prescribed pantoprazrole recently. He confirms he is taking famotidine not pantoprazole. Added this to the medication list.    Has appt in July with Dr. Julien Girt.

## 2020-05-05 ENCOUNTER — Encounter (HOSPITAL_COMMUNITY): Payer: Self-pay | Admitting: Gastroenterology

## 2020-05-05 NOTE — Nursing Note (Signed)
No PA is required for Colonoscopy & EGD scheduled 07/17/20, as Medicare is primary insurance.    Daymon Larsen, LPN 71/16/57 90:38

## 2020-05-08 ENCOUNTER — Other Ambulatory Visit (INDEPENDENT_AMBULATORY_CARE_PROVIDER_SITE_OTHER): Payer: Medicare Other

## 2020-05-08 ENCOUNTER — Other Ambulatory Visit: Payer: Self-pay

## 2020-05-08 ENCOUNTER — Ambulatory Visit (INDEPENDENT_AMBULATORY_CARE_PROVIDER_SITE_OTHER): Payer: Medicare Other

## 2020-05-08 DIAGNOSIS — D649 Anemia, unspecified: Secondary | ICD-10-CM

## 2020-05-08 DIAGNOSIS — Z86718 Personal history of other venous thrombosis and embolism: Secondary | ICD-10-CM

## 2020-05-08 LAB — CBC WITH DIFF
BASOPHIL #: 0.06 10*3/uL (ref 0.00–0.10)
BASOPHIL %: 1 % (ref 0–1)
EOSINOPHIL #: 0.24 10*3/uL (ref 0.00–0.40)
EOSINOPHIL %: 6 % (ref 0–6)
HCT: 42 % (ref 40.7–52.3)
HGB: 13.8 g/dL (ref 12.5–16.9)
LYMPHOCYTE #: 1.61 10*3/uL (ref 1.00–3.70)
LYMPHOCYTE %: 37 % (ref 14–45)
MCH: 27.9 pg (ref 27.0–33.4)
MCHC: 32.9 g/dL (ref 31.0–37.0)
MCV: 84.8 fL (ref 84.1–99.3)
MONOCYTE #: 0.37 10*3/uL (ref 0.00–0.70)
MONOCYTE %: 9 % (ref 0–14)
NEUTROPHIL #: 2.07 10*3/uL (ref 1.50–7.00)
NEUTROPHIL %: 48 % (ref 43–80)
PLATELETS: 212 10*3/uL (ref 129–381)
RBC: 4.95 10*6/uL (ref 4.09–5.89)
RDW: 15 % (ref 10.8–15.2)
WBC: 4.4 10*3/uL (ref 2.6–9.8)

## 2020-05-08 LAB — POCT INR (RESULTS): POCT INR: 2.8

## 2020-05-08 LAB — FERRITIN: FERRITIN: 40 ng/mL (ref 18–464)

## 2020-05-08 LAB — IRON: IRON: 62 ug/dL (ref 49–181)

## 2020-05-08 NOTE — Ancillary Notes (Signed)
Department of Community Practice     Venipuncture performed in office on left arm antecubital vein, dry pressure dressing was applied to site and patient tolerated it well.  Specimen was centrifuged, aliquoted as needed and specimen was labeled and packaged for transport.    Woodroe Mode, PHLEBOTOMIST  05/08/2020, 10:32

## 2020-05-11 ENCOUNTER — Encounter (INDEPENDENT_AMBULATORY_CARE_PROVIDER_SITE_OTHER): Payer: Self-pay

## 2020-05-11 NOTE — Progress Notes (Signed)
Pt notified of results by MyChart. Comments added by Harley Alto, LPN  on 38/75/64 at 33:29.

## 2020-05-13 ENCOUNTER — Ambulatory Visit: Payer: Medicare Other | Attending: Family Medicine

## 2020-05-13 DIAGNOSIS — E119 Type 2 diabetes mellitus without complications: Secondary | ICD-10-CM | POA: Insufficient documentation

## 2020-05-13 NOTE — Nursing Note (Signed)
05/13/20 1400   Nutrition Management    State most important reason to use a meal plan 1 - Needs Instructions   State how meal spacing helps blood sugar control 3 - Verbalizes/Demonstrates Competency   State how composition of meals affect blood sugar 2 - Needs Review/Assistance   Describe how to keep a food diary 1 - Needs Instruction   State how monitoring amount of food can help reach blood sugar levels 2 - Needs Review/Assistance   Describe personal meal plan 1 - Needs Instruction   Use meal plan to plan meals 1 - Needs Instruction   Use meal plan when eating away from home 1 - Needs Instruction   Identify behaviors to help control weight 3 - Verbalizes/Demonstrates Competency   Explain how alcohol affects blood sugar 1 - Needs Instruction   Use food labels to choose food 3 - Verbalizes/Demonstrates Competency   Discuss benefits/cons of fiber 2 - Needs Review/Assistance   Plan eating/behavior changes working toward personal goals 2 - Needs Review/Assistance   Identify behaviors to help reduce risk of heart disease 2 - Needs Review/Assistance

## 2020-05-13 NOTE — Progress Notes (Signed)
Assessment: Pt is 64 yo male here for f/u visit due to T2DM and HbA1c 6.3%. PMH: celiac disease, gout, osteoporosis, CAD, fatty liver, CKD- GFR 55. Patient reports no flare up from gout- well controlled on meds and didn't want to limit purines in diet. Patient is focusing on carb counting, gluten free, and low fat diet plan.     B- eggs, cheerios- 1% milk, [place] bacon  L- soups, sandwiches  D-  Hot dog- no bun, pork beef, fish, 1-2x trying vegan dinners of potatos, corn, green beans, asparagus, chicken  S- cheese- mozarella and corn chips or popcorn no butter    Ht: 6'2.6"    Wt: 227lb (8lb weight loss since last visit due to dietary changes and increased walking and yardwork).    Estimated Nutrient Needs:  IBW: 190lb             ABW: 200lb (91kg)  Calories:        2275    kcal per day (25      kcal/  91      kg    ABW   )  Protein:           73   g per day (   0.8       g/   91     kg      ABW )  Fiber: 10-15g per day  Fat:                 37-61   g per day (0.15% - 0.35% of total     2200       kcal per day)  Water:        2275        ml per day (  25      ml/ 91       kg    ABW   )    Nutrition Diagnosis Altered nutrition related lab values related to T2DM as evidenced by HbA1c 6.3%.    Intervention:  Time spent with patient:    30         minutes    Education focused on answering patient questions, reviewing low fat tips/tricks, and tweaking current dietary intake.     Handouts with discussion:   Runner, broadcasting/film/video- carbohydrate counting, protein, fat, fiber  Gut Health Salad  Nutrition label / Portion Control  Daily Food Records   Engineer, manufacturing systems List  7 Day Example Meal Plan    Booklets Given:   Nutrition in the [employer] booklet    Monitor/Evaluation:     Patient is more aware of nutrient content of food and can recite carb needs. Did not track food, but more aware. Stated a 170mg /dl event after [place] food, but rarely checking. He is eating more fish and chicken, but states still doing a lot of red  meat. Eating more vegetables and pairing protein and fiber at each meal, but not heavily tracking math.     Personal Goals:   1) 30-45g carbs 3x daily with 15-20g carbs optional 3x daily.   2) Take 10 min. Walk at [facility] to get blood sugars down.   3) Continue to avoid gluten.   4) For desert limit corn flakes and cheese to 20 chips and 1/4cup white cheese.   5) Limit fat intake to 37-61g / day.   6) Continue to try and cut out red meat for improved heart health.   7)  Switch from canola oil to og fat carlini olive oil to reduce fat in diet.     Provided RD contact information.    Nursing Notes:   Madison Hickman, [place]  05/13/20 1440  Signed     05/13/20 1400   Nutrition Management    State most important reason to use a meal plan 1 - Needs Instructions   State how meal spacing helps blood sugar control 3 - Verbalizes/Demonstrates Competency   State how composition of meals affect blood sugar 2 - Needs Review/Assistance   Describe how to keep a food diary 1 - Needs Instruction   State how monitoring amount of food can help reach blood sugar levels 2 - Needs Review/Assistance   Describe personal meal plan 1 - Needs Instruction   Use meal plan to plan meals 1 - Needs Instruction   Use meal plan when eating away from home 1 - Needs Instruction   Identify behaviors to help control weight 3 - Verbalizes/Demonstrates Competency   Explain how alcohol affects blood sugar 1 - Needs Instruction   Use food labels to choose food 3 - Verbalizes/Demonstrates Competency   Discuss benefits/cons of fiber 2 - Needs Review/Assistance   Plan eating/behavior changes working toward personal goals 2 - Needs Review/Assistance   Identify behaviors to help reduce risk of heart disease 2 - Needs Review/Assistance

## 2020-05-15 ENCOUNTER — Encounter (INDEPENDENT_AMBULATORY_CARE_PROVIDER_SITE_OTHER): Payer: Self-pay | Admitting: Family Medicine

## 2020-06-02 ENCOUNTER — Encounter (INDEPENDENT_AMBULATORY_CARE_PROVIDER_SITE_OTHER): Payer: Self-pay

## 2020-06-08 NOTE — Progress Notes (Signed)
PRIMARY CARE, [facility] [place] [id number]    Annual Medicare Wellness Exam    Name: Dustin Webster MRN:  [id number]   Date: 06/11/2020 Age: 64 y.o.         Encounter Date: 06/11/2020   Leim Fabry, NP    PCP: [name], MD          SUBJECTIVE:   Dustin Webster is a 64 y.o. male for presenting for annual Medicare Wellness Visit.      I reviewed comprehensive health self assessment (signed and scanned into record). Issues identified: NONE. I have reviewed and reconciled the medication list with the patient today. 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    Pericardial effusion     Pulmonary emboli (CMS HCC)      Past Surgical History:   Procedure Laterality Date    HX CHOLECYSTECTOMY      HX CORONARY ARTERY BYPASS GRAFT      HX HEART SURGERY  12/2015    HX HERNIA REPAIR      Inguinal     HX LIPOMA RESECTION      Chest    OTHER SURGICAL HISTORY Left 01/2016    Evacuation of hematoma -Left Atrium  at [facility]  06/21/2013    melanoma/ear/[facility]    TRANSURETHRAL MICROWAVE THERAPY  05/24/2018    Dr Leandro Reasoner     Outpatient Medications Prior to This Office Visit:  Current Outpatient Medications   Medication Sig    allopurinoL (ZYLOPRIM) 100 mg Oral Tablet TAKE 1 TABLET DAILY    amLODIPine (NORVASC) 10 mg Oral Tablet Take 1 Tablet (10 mg total) by mouth Once a day    ascorbic acid (VITAMIN C) 1,000 mg Oral Tablet  Take 500 mg by mouth Once a day    ascorbic acid, vitamin C, (VITAMIN C) 500 mg Oral Tablet 500 mg Once a day     aspirin (ECOTRIN) 81 mg Oral Tablet, Delayed Release (E.C.) Once a day    azelastine (OPTIVAR) 0.05 % Ophthalmic Drops     Calcium Carbonate (OS-CAL) 650 mg calcium (1,625 mg) Oral Tablet Every evening     Cholecalciferol, Vitamin D3, (VITAMIN D-3) 50 mcg (2,000 unit) Oral Capsule Take by mouth    cholestyramine-sucrose (QUESTRAN) 4 gram Oral Powder in Packet 1 PACKET BY MOUTH DAILY    citalopram (CELEXA) 10 mg Oral Tablet TAKE 1 TABLET DAILY    famotidine (PEPCID) 40 mg Oral Tablet Take 1 Tablet (40 mg total) by mouth Twice daily    fenofibrate nanocrystallized (TRICOR) 145 mg Oral Tablet TAKE 1 TABLET DAILY    ferrous sulfate (FERATAB) 324 mg (65 mg iron) Oral Tablet, Delayed Release (E.C.) Take 1 Tablet (324 mg total) by mouth Once a day    fluorometholone (FML  LIQUIFILM) 0.1 % Ophthalmic Drops, Suspension     KRILL OIL ORAL Take by mouth    LUTEIN ORAL Take by mouth    Magnesium 250 mg Oral Tablet Take 500 mg by mouth Once a day     melatonin 1 mg Oral Tablet As directed    multivitamin (CENTRUM SILVER) 400-250 mcg Oral Tablet, Chewable Take 1 Tab by mouth Once a day Taking different brank with no vit K    nitroGLYCERIN (NITROSTAT) 0.4 mg Sublingual Tablet, Sublingual 1 Tab (0.4 mg total) by Sublingual route Every 5 minutes as needed for Chest pain for 3 doses over 15 minutes    triamcinolone acetonide (ARISTOCORT A) 0.1 % Ointment APPLY TO AFFECTED AREA TWICE A DAY AS NEEDED    UBIDECARENONE (COQ-10 ORAL) Once a day    warfarin (COUMADIN) 10 mg Oral Tablet Daily or as directed    Warfarin (COUMADIN) 4 mg Oral Tablet Daily or as directed     Family Medical History:     Problem Relation (Age of Onset)    Emphysema Father    Heart Attack General Family Hx    Heart Disease Mother    Hypertension (High Blood Pressure) Mother    No Known Problems Other    Stroke Mother                     Social History     Socioeconomic History    Marital status: Married     Spouse name: Levada Dy    Number of children: 1    Years of education: Not on file    Highest education level: Not on file   Occupational History    Occupation: Other Land     Comment: Retired   Tobacco Use    Smoking status: Never Smoker    Smokeless tobacco: Never Used   Brewing technologist Use: Never used   Substance and Sexual Activity    Alcohol use: Not Currently     Comment: rare    Drug use: Never    Sexual activity: Yes     Partners: Female     Social Determinants of Company secretary Strain:     Difficulty of Paying Living Expenses:    Food Insecurity:     Worried About Charity fundraiser in the Last Year:     Arboriculturist in the Last Year:    Transportation Needs:     Film/video editor (Medical):     Lack of Transportation (Non-Medical):    Physical Activity:     Days of Exercise per Week:     Minutes of Exercise per Session:    Stress:     Feeling of Stress :    Intimate Partner Violence:     Fear of Current or Ex-Partner:     Emotionally Abused:     Physically Abused:     Sexually Abused:        Diet History: He consumes a healthy diet most of the time.  Exercise History: He exercises 20 minutes a day, 3 or more days a week, most of the time.     List of Current Health Care Providers   Name  Condition   Patient Care Team:  [name], MD as PCP - General (EXTERNAL)  Landry Corporal, MD as PCP - Cardiologist ([place])  Leandro Reasoner [place] Haggard, MD (UROLOGY)  Santiago Glad, Carroll Sage, MD ([place])  [place], Ardyth Harps, [place] (EXTERNAL)  Sheila Oats, FNP-C (GASTROENTEROLOGY)     Medicare Wellness Assessment   Medicare initial or wellness physical in the last year?: No   Depression Screen   Depression Screen  Little interest or pleasure in doing things.: Not at all  Feeling down, depressed, or  hopeless: Not at all  PHQ 2 Total: 0   Functional Ability and Safety Screen       Activities of Daily Living  Do you need help with dressing, bathing, or walking?: No  Do you need help with shopping, housekeeping, medications, or finances?: No  Do you have rugs in hallways, broken steps, or poor lighting?: No  Do you have grab bars in your bathroom, non-slip strips in your tub, and hand rails on your stairs?: No   Hearing Screen  Have you noticed any hearing difficulties?: Yes  After whispering 9-1-6 how many numbers did the patient repeat correctly?: 3   Vision Screen  Right Eye = 20: 15  Left Eye = 20: 15   Fall Risk Assessment  Do you feel unsteady when standing or walking?: Yes  Do you worry about falling?: No  Have you fallen in the past year?: No  Timed up and go test (in seconds): 5   Cognitive Function Screen (give one point for each)   What is you age?: Correct   What is the time to the nearest hour?: Correct       What is the year?: Correct   What is the name of this clinic?: Correct   Can the patient recognize two persons (the doctor, the nurse, home help, etc.)?: Correct   What is the date of your birth? (day and month sufficient) : Correct   In what year did World War II end?: Correct   Who is the current president of the [place]?: Correct   Count from 20 down to 1?: Correct   Total Score: 9                                                                   Advance Directives (optional)   Does patient have a living will or MPOA: no   Has patient provided [employer] with a copy?: no   Advance directive information given to the patient today?: YES     ROS:  Review of Systems   Constitutional: Negative for chills and fever.   Cardiovascular: Negative for chest pain.   Respiratory: Negative for cough and shortness of breath.    Musculoskeletal: Negative for muscle weakness.   Psychiatric/Behavioral: Negative for depression.        OBJECTIVE:   Vitals:    06/11/20 0900   BP: 112/82   Pulse: 76    Temp: 35.9 C (96.6 F)   SpO2: 97%   Weight: 102 kg (225 lb)   Height: 1.88 m (6' 2" )   BMI: 28.95           PHYSICAL EXAM:  Physical Exam  Constitutional:  General: He is not in acute distress.     Appearance: Normal appearance.   Pulmonary:      Effort: No respiratory distress.   Neurological:      Mental Status: He is alert.      Motor: No weakness.      Gait: Gait normal.   Psychiatric:         Mood and Affect: Mood normal.         Behavior: Behavior normal.         ASSESSMENT/PLAN:      Identified Risk Factors/ Recommended Actions   1. Encounter for Medicare annual wellness exam  Completed today. Will need Medicare Wellness in 1 year.    2. Family history of abdominal aortic aneurysm (AAA)  At risk, Mother had an Abdominal Aortic Aneurysm. Ordered today.  - ABDOMINAL DUPLEX - AORTA - MEDICARE SCREEN FOR AAA; Future    3. Encounter for hepatitis C screening test for low risk patient  Not up to date, ordered today.  - HEPATITIS C ANTIBODY SCREEN WITH REFLEX TO HCV PCR; Future    4. Screening for colorectal cancer  Up to date. Last completed on 10/10/2012 with Dr. Candiss Norse revealing Celiac Disease. He follows with Sheila Oats FNP-C gastroenterology.    5. Screening for cardiovascular condition  Lab Results   Component Value Date    CHOLESTEROL 155 01/14/2020    HDLCHOL 33 (L) 01/14/2020    LDLCHOL 78 01/14/2020    LDLCHOLDIR 91 05/07/2018    TRIG 221 (H) 01/14/2020      Up to date. He is unable to tolerate statin drugs.  He was counseled a dietitian and has may drastic dietary changes.    6. Diet-controlled diabetes mellitus (CMS HCC)  Lab Results   Component Value Date    HA1C 6.3 (H) 01/14/2020     At today, currently not controlled.    7. BPH with obstruction/lower urinary tract symptoms  PROSTATE SPECIFIC ANTIGEN   Lab Results   Component Value Date/Time    PROSSPECAG 0.20 04/15/2020 11:28 AM        Up-to-date.  Follows with Dr. due again for BPH.    8. 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  Plan of care initiated and education provided.    9. BMI 28.0-28.9,adult                 Fall Risk Assessment/PLAN (High Risk if 2 falls or ANY fall with injury in the last year): He denies any falls in the past year.  Urinary Incontinence Assessment/PLAN: N/A  Written Prevention Plan reviewed and shared in writing with the patient (See Patient Instructions in After Visit Summary).     Fall Risk Follow up plan of care: Discussed optimizing home safety  Footwear and potential problems addressed    The patient has been educated about risk factors and recommended preventive care. Written Prevention Plan completed/ updated and given to patient (see After Visit Summary).    Follow up: Return in about 1 year (around 06/11/2021) for Medicare Wellness.    Patient has been seen in this clinic within the last 3 years.    Leim Fabry, NP    [place]    This note was partially created using MModal Fluency Direct system (voice recognition software) and is inherently subject to errors  including those of syntax and "sound-alike" substitutions which may escape proofreading.  In such instances, original meaning may be extrapolated by contextual derivation.

## 2020-06-08 NOTE — Patient Instructions (Addendum)
Medicare Preventive Services  Medicare coverage information Recommendation for YOU   Heart Disease and Diabetes   Lipid profile every 5 years or more often if at risk for cardiovascular disease  Last Lipid Panel  (Last result in the past 2 years)      Cholesterol   HDL   LDL   Direct LDL   Triglycerides      01/14/20 1208 [id number]   221         Diabetes Screening with Blood Glucose test or Glucose Tolerance Test  yearly for those at risk for diabetes, up to two tests per year for those with prediabetes  Last Glucose: 114     Diabetes Self-Management Training initial training ten hours per year, and follow-up training two hours per subsequent year. Optional for those with diabetes    Medical Nutrition Therapy three hours of one-on-one counseling in first year, two hours in subsequent years. Optional for those with diabetes, kidney disease   Intensive Behavioral Therapy for Obesity  Face-to-face counseling, first month every week, month 2-6 every other week, month 7-12 every month if continued progress is documented Optional for those with Body Mass Index 30 or higher  Your Body mass index is 28.89 kg/m.   Tobacco Cessation (Quitting) Counseling   Two attempts per year, max 4 sessions per attempt, up to 8 sessions per year Optional for those who use tobacco    Cancer Screening   Colorectal screening   for anyone age 3 to 94 or any age if high risk:   Screening Colonoscopy every 10 years, more frequent if high risk  OR   Flexible  Sigmoidoscopy  every 5 years OR   Fecal Occult Blood Testing yearly OR   Cologuard Stool DNA test once every 3 years OR   CT Colonography every 5 years  Last Colonoscopy completed 10/10/2012 with Dr. Candiss Norse- Celiac Disease   Prostate Cancer Screening  Prostate Specific Antigen and Digital Rectal Exam NOT routinely recommended  Medicare will still cover annually after age 36 if recommended by your provider  Last PSA 0.20 on 04/15/2020- normal   Lung Cancer Screening  Annual low dose  computed tomography (LDCT scan) is recommended for those age 32-77 who smoked 30 pack-years and are current smokers or quit smoking within past 15 years (one pack-year= smoking one PPD for one year), after counseling by your doctor or nurse clinician about the possible benefits or harms. Not applicable   Vaccinations   Pneumococcal Vaccine recommended routinely age 20+ with two separate vaccines one year apart (Prevnar then Pneumovax).  Recommended before age 12 if medical conditions increase risk  Seasonal Influenza Vaccine once every flu season   Hepatitis B Vaccine 3 doses if risk (including anyone with diabetes or liver disease)  Shingles Vaccine Once or twice age 71 or older  Diphtheria Tetanus Pertussis Vaccine ONCE as adult and a booster every 10 years      Immunization History   Administered Date(s) Administered    Covid-19 Vaccine,Moderna 01/15/2020, 02/12/2020    DIPTH,PERTUSSIS-ACEL,TETANUS (ADACEL) >11 YRS OLD    (ADMIN) 05/08/2013    Flucelvax Influenza Vaccine, 80yr+ 08/29/2018    H1n1 Flu Vaccine Injection (Admin) 10/04/2008    Influenza Vaccine, 6 month-adult 08/26/2015, 08/10/2017, 08/29/2018, 07/30/2019    PREVNAR 13 (ADMIN) 07/05/2017    Pneumovax 11/26/2018    Shingrix - Zoster Vaccine (Admin) 09/26/2018, 11/30/2018    ZOSTAVAX (VARICELLA ZOSTER VACCINE (ADMIN) 01/23/2017     Shingles vaccine and Diphtheria  Tetanus Pertussis vaccines are available at pharmacies or local health department without a prescription.   Other Screening   Glaucoma Screening   yearly if in high risk group such as diabetes, family history, African American age 33+ or Hispanic American age 28+ Will send for records   Hepatitis C Screening recommended ONCE for those born between 1945-1965, or high risk for HCV infection  Recommended, ordered   HIV Testing recommended routinely at least ONCE, covered every year for age 52 to 65 regardless of risk, and every year for age over 21 who ask for the test or higher risk Not  applicable  Abdominal Aortic Aneurysm Screening Ultrasound Recommended ONCE for any male who smoked 100 cigarettes/lifetime OR with family history of aortic aneurysm Recommended, ordered   Your Personalized Schedule for Preventive Tests   Health Maintenance: Pending and Last Completed       Date Due Completion Date    Hepatitis C screening Never done ---    HIV Screening Never done ---    Annual Wellness Visit Never done 06/11/2020    Influenza Vaccine (1) 07/15/2020 09/03/2019    Hemoglobin A1C for Diabetes Control 01/13/2021 01/14/2020    Colonoscopy 10/10/2022 10/10/2012    Adult Tdap-Td (2 - Td or Tdap) 05/09/2023 05/08/2013    Pneumococcal 65+ Years Low Risk (2 of 2 - PPSV23) 11/27/2023 11/26/2018            Exercises to Prevent Falls  Certain types of exercises may help make you less likely to fall. Try the ones below. Or do other exercises that your healthcare provider suggests. Depending on your health, you may need to start slowly. Don't let that stop you. Even small amounts of exercise can help you. Be sure to talk to your healthcare provider before starting any exercise program.    Improve balance  Many types of exercise can help improve balance. Tai chi and yoga are good examples. Here's another one to try. You can do it anytime and almost anywhere.   Stand next to a counter or solid support.   Push yourself up onto your tiptoes.   Hold for 5 seconds. If you start to lose your balance, hold on to the counter.   Rest and repeat 5 times. Work up to holding for 20 to 30 seconds, if you can. Increase flexibility  Being more flexible makes it easier for you to move around safely. Try exercises like the seated hamstring stretch.   Sit in a chair and put one foot on a stool.   Straighten your leg and reach with both hands down either side of your leg. Reach as far down your leg as you can.   Hold for about 20 seconds.   Go back to the starting position. Then repeat 5 times. Switch legs. Build  strength  "Resistance" exercises help build strength. You can do them without equipment. Or you can use weights, elastic bands, or special machines. One such exercise is called the biceps curl. You can hold a 1-pound weight or even a can of soup. Do this exercise at least 3 times a week. Strive for every day.   Sit up straight in a chair.   Keep your elbow close to your body and your wrist straight.   Bend your arm, moving your hand up to your shoulder. Then slowly lower your arm.   Repeat 5 times. Switch to the other arm.     Build your staying power  Aerobic exercises make your  heart and lungs stronger so you can keep moving longer. Walking and swimming are two of the best types of exercises you can do. Using a stationary bike is great, too. Find an aerobic exercise that you enjoy. Start slowly and build up. Even 5 minutes is helpful. Aim for a goal of 30 minutes, at least 3 times a week. You don't have to do 30 minutes in 1 session. Break it up and walk a little throughout the day.    More helpful tips   Start easy. Slowly work up to doing more.   Talk with your healthcare provider about the best exercises for you.   Call senior centers or health clubs about exercise programs.   If needed, have a family member watch you walk every so often to check your stability.   Exercise with a friend. Choose an activity you both enjoy.   Consider tai chi or yoga to strengthen your balance.   Try exercises that you can do anytime, anywhere. Here are 2 examples. Have someone with you when you first try these:  ? Practice walking by placing 1 foot right in front of the other.  ? Stand up and sit down 10 times. Repeat this throughout the day.   StayWell last reviewed this educational content on 03/14/2016   2000-2019 [employer]. [address] [place] [address]. All rights reserved. This information is not intended as a substitute for professional medical care. Always follow your healthcare  professional's instructions.    Preventing Falls in the Home  An adult or child can fall for many reasons. If you are an older adult, you may fall because your reaction time slows down. Your muscles and joints may get stiff, weak, or less flexible because of illness, medicines, or a physical condition. These things can also make a child more likely to fall or be injured in a fall.  Other health problems that make falls more likely include:   Arthritis   Dizziness or lightheadedness when you get out of bed (orthostatic hypotension)   History of a stroke   Dizziness   Anemia   Certain medicines taken for mental illness or to control blood pressure.   Problems with balance or gait   Bladder or urinary problems   History of falls with or without an injury   Changes in vision (vision impairment)   Changes in thinking skills and memory (cognitive impairment)  Injuries from a fall can include broken bones, dislocated joints, internal bleeding and cuts. When these injuries are serious enough, they can make it impossible for you or a child who is injured in a fall to live on his or her ownhome.  Prevention tips  To help prevent falls and fall-related injuries, follow the tips below.   Floors  Make floors safer by doing the following:    Put nonskid pads under area rugs.   Remove throw rugs.   Replace worn floor coverings.   Tack carpets firmly to each step on carpeted stairs. Put nonskid strips on the edges of uncarpeted stairs.   Keep floors and stairs free of clutter and cords.   Arrange furniture so there are clear pathways.   Clean up any spills right away.   Wear shoes that fit.  Bathrooms  Make bathrooms safer by doing the following:    Install grab bars in the tub or shower.   Apply nonskid strips or put a nonskid rubber mat in the tub or shower.  Sit on a bath chair to bathe.   Use bathmats with nonskid backing.  Lighting and the environment  Improve lighting in your home by doing the  following:    Keep a flashlight in each room. Or put a lamp next to the bed within easy reach.   Put nightlights in the bedrooms, hallways, kitchen, and bathrooms.   Make sure all stairways have good lighting.   Take your time when going up and down stairs.   Put handrails on both sides of stairs and in walkways for more support. To prevent injury to your wrist or arm, dont use handrails to pull yourself up.   Install grab bars to pull yourself up.   Move or rearrange items that you use often. This will make them easier to find or reach.   Look at your home to find any safety hazards. Especially look at doorways, walkways, and the driveway. Remove or repair any safety problems that you find.  StayWell last reviewed this educational content on 06/15/2015   2000-2019 [employer]. [address] [place] [address]. All rights reserved. This information is not intended as a substitute for professional medical care. Always follow your healthcare professional's instructions.

## 2020-06-09 ENCOUNTER — Encounter (HOSPITAL_BASED_OUTPATIENT_CLINIC_OR_DEPARTMENT_OTHER): Payer: Self-pay

## 2020-06-11 ENCOUNTER — Ambulatory Visit (INDEPENDENT_AMBULATORY_CARE_PROVIDER_SITE_OTHER): Payer: Medicare Other

## 2020-06-11 ENCOUNTER — Ambulatory Visit (INDEPENDENT_AMBULATORY_CARE_PROVIDER_SITE_OTHER): Payer: Medicare Other | Admitting: Family Medicine

## 2020-06-11 ENCOUNTER — Other Ambulatory Visit: Payer: Self-pay

## 2020-06-11 ENCOUNTER — Other Ambulatory Visit (INDEPENDENT_AMBULATORY_CARE_PROVIDER_SITE_OTHER): Payer: Medicare Other

## 2020-06-11 ENCOUNTER — Other Ambulatory Visit: Payer: Medicare Other

## 2020-06-11 ENCOUNTER — Encounter (INDEPENDENT_AMBULATORY_CARE_PROVIDER_SITE_OTHER): Payer: Self-pay | Admitting: Family Medicine

## 2020-06-11 ENCOUNTER — Encounter (INDEPENDENT_AMBULATORY_CARE_PROVIDER_SITE_OTHER): Payer: Self-pay

## 2020-06-11 VITALS — BP 112/82 | HR 76 | Temp 96.6°F | Ht 74.0 in | Wt 225.0 lb

## 2020-06-11 DIAGNOSIS — D649 Anemia, unspecified: Secondary | ICD-10-CM

## 2020-06-11 DIAGNOSIS — I25110 Atherosclerotic heart disease of native coronary artery with unstable angina pectoris: Secondary | ICD-10-CM

## 2020-06-11 DIAGNOSIS — Z1159 Encounter for screening for other viral diseases: Secondary | ICD-10-CM | POA: Insufficient documentation

## 2020-06-11 DIAGNOSIS — C4321 Malignant melanoma of right ear and external auricular canal: Secondary | ICD-10-CM

## 2020-06-11 DIAGNOSIS — E119 Type 2 diabetes mellitus without complications: Secondary | ICD-10-CM

## 2020-06-11 DIAGNOSIS — N138 Other obstructive and reflux uropathy: Secondary | ICD-10-CM

## 2020-06-11 DIAGNOSIS — K219 Gastro-esophageal reflux disease without esophagitis: Secondary | ICD-10-CM

## 2020-06-11 DIAGNOSIS — I129 Hypertensive chronic kidney disease with stage 1 through stage 4 chronic kidney disease, or unspecified chronic kidney disease: Secondary | ICD-10-CM

## 2020-06-11 DIAGNOSIS — E782 Mixed hyperlipidemia: Secondary | ICD-10-CM

## 2020-06-11 DIAGNOSIS — N401 Enlarged prostate with lower urinary tract symptoms: Secondary | ICD-10-CM

## 2020-06-11 DIAGNOSIS — N183 Chronic kidney disease, stage 3 unspecified (CMS HCC): Secondary | ICD-10-CM | POA: Insufficient documentation

## 2020-06-11 DIAGNOSIS — Z6828 Body mass index (BMI) 28.0-28.9, adult: Secondary | ICD-10-CM

## 2020-06-11 DIAGNOSIS — Z1211 Encounter for screening for malignant neoplasm of colon: Secondary | ICD-10-CM

## 2020-06-11 DIAGNOSIS — I10 Essential (primary) hypertension: Secondary | ICD-10-CM

## 2020-06-11 DIAGNOSIS — K900 Celiac disease: Secondary | ICD-10-CM

## 2020-06-11 DIAGNOSIS — Z9181 History of falling: Secondary | ICD-10-CM

## 2020-06-11 DIAGNOSIS — I2699 Other pulmonary embolism without acute cor pulmonale: Secondary | ICD-10-CM

## 2020-06-11 DIAGNOSIS — Z136 Encounter for screening for cardiovascular disorders: Secondary | ICD-10-CM

## 2020-06-11 DIAGNOSIS — Z0000 Encounter for general adult medical examination without abnormal findings: Secondary | ICD-10-CM

## 2020-06-11 DIAGNOSIS — Z8249 Family history of ischemic heart disease and other diseases of the circulatory system: Secondary | ICD-10-CM

## 2020-06-11 DIAGNOSIS — E1122 Type 2 diabetes mellitus with diabetic chronic kidney disease: Secondary | ICD-10-CM

## 2020-06-11 DIAGNOSIS — N1831 Chronic kidney disease, stage 3a: Secondary | ICD-10-CM | POA: Diagnosis present

## 2020-06-11 LAB — COMPREHENSIVE METABOLIC PNL, FASTING
ALBUMIN: 5 g/dL (ref 3.5–5.0)
ALKALINE PHOSPHATASE: 34 U/L — ABNORMAL LOW (ref 38–126)
ALT (SGPT): 33 U/L (ref <=50)
ANION GAP: 9 mmol/L
AST (SGOT): 36 U/L (ref 17–59)
BILIRUBIN TOTAL: 0.7 mg/dL (ref 0.2–5.0)
BUN/CREA RATIO: 17
BUN: 28 mg/dL — ABNORMAL HIGH (ref 9–20)
CALCIUM: 10.1 mg/dL (ref 8.4–10.2)
CHLORIDE: 104 mmol/L (ref 98–107)
CO2 TOTAL: 27 mmol/L (ref 22–30)
CREATININE: 1.68 mg/dL — ABNORMAL HIGH (ref 0.66–1.25)
ESTIMATED GFR: 42 mL/min/{1.73_m2} — ABNORMAL LOW (ref >=60)
GLUCOSE: 111 mg/dL — ABNORMAL HIGH (ref 74–106)
POTASSIUM: 4.4 mmol/L (ref 3.5–5.1)
PROTEIN TOTAL: 7.9 g/dL (ref 6.3–8.2)
SODIUM: 140 mmol/L (ref 137–145)

## 2020-06-11 LAB — CBC WITH DIFF
BASOPHIL #: 0.06 10*3/uL (ref 0.00–0.10)
BASOPHIL %: 1 % (ref 0–1)
EOSINOPHIL #: 0.28 10*3/uL (ref 0.00–0.40)
EOSINOPHIL %: 6 % (ref 0–6)
HCT: 44 % (ref 40.7–52.3)
HGB: 13.9 g/dL (ref 12.5–16.9)
LYMPHOCYTE #: 1.62 10*3/uL (ref 1.00–3.70)
LYMPHOCYTE %: 37 % (ref 14–45)
MCH: 26.8 pg — ABNORMAL LOW (ref 27.0–33.4)
MCHC: 31.6 g/dL (ref 31.0–37.0)
MCV: 84.8 fL (ref 84.1–99.3)
MONOCYTE #: 0.55 10*3/uL (ref 0.00–0.70)
MONOCYTE %: 12 % (ref 0–14)
NEUTROPHIL #: 1.91 10*3/uL (ref 1.50–7.00)
NEUTROPHIL %: 43 % (ref 43–80)
PLATELETS: 207 10*3/uL (ref 129–381)
RBC: 5.19 10*6/uL (ref 4.09–5.89)
RDW: 16.4 % — ABNORMAL HIGH (ref 10.8–15.2)
WBC: 4.4 10*3/uL (ref 2.6–9.8)

## 2020-06-11 LAB — LIPID PANEL
CHOLESTEROL: 186 mg/dL (ref 0–199)
HDL CHOL: 44 mg/dL (ref 40–60)
LDL CALC: 106 mg/dL (ref 0–130)
TRIGLYCERIDES: 178 mg/dL — ABNORMAL HIGH (ref 0–149)
VLDL CALC: 36 mg/dL

## 2020-06-11 LAB — THYROID STIMULATING HORMONE WITH FREE T4 REFLEX: TSH: 2.26 u[IU]/mL (ref 0.465–4.680)

## 2020-06-11 LAB — FERRITIN: FERRITIN: 71 ng/mL (ref 18–464)

## 2020-06-11 LAB — VITAMIN B12: VITAMIN B 12: 793 pg/mL (ref 239–931)

## 2020-06-11 LAB — MAGNESIUM: MAGNESIUM: 2 mg/dL (ref 1.9–2.3)

## 2020-06-11 LAB — HEPATITIS C ANTIBODY SCREEN WITH REFLEX TO HCV PCR: HCV ANTIBODY QUALITATIVE: NEGATIVE

## 2020-06-11 LAB — VITAMIN D 25 TOTAL: VITAMIN D 25, TOTAL: 50.1 ng/mL (ref 30.00–100.00)

## 2020-06-11 LAB — HGA1C (HEMOGLOBIN A1C WITH EST AVG GLUCOSE)
ESTIMATED AVERAGE GLUCOSE: 140 mg/dL
HEMOGLOBIN A1C: 6.5 % — ABNORMAL HIGH (ref 4.3–6.1)

## 2020-06-11 NOTE — Progress Notes (Signed)
PRIMARY CARE, [facility]  [place] [address]    History and Physical     Name: Dustin Webster MRN:  [id number]   Date: 06/11/2020 Age: 64 y.o.           PCP: [name], MD     Reason for Visit: Hypertension (amlodipine, continues following Dr. Cornelia Copa for coumadin for CAD/Afib/PE), Hyperlipidemia (fenofibrate, fasting for routine labs to be done today), and Fatigue (5-6 years states began once dx with Celiac disease. Saw dietician and has been modifying diet and has lost weight.)    History of Present Illness  Dustin Webster is a 64 y.o. male who is being seen today for routine fu.  States overall feeling fairly well.  H/o celiac disease but has recently seen a dietician to help more with nutrition with avoiding gluten which he had already been doing.  Does feel better but admits eating more greens.  Cardiology follows INR and has remained fairly stable has mild chronic abd pain and this unchanged - no bleeding or other and reflux also doing well.  Blood sugars doing well with this new nutrition and has regular eye exams. No meds for this at present     BP remains stable without CP or dyspnea or palpitations.  No edema.  H/o previous PE as well but no dyspnea or cough etc.   Renal labs have remained stable as well.  Hoping  diet and weight loss will help with lipids as well fasting for labs - has known fatty liver and has seen Gi hoping weight loss and cholesterol treatment will help this.      Past Medical History:   Diagnosis Date   . Anemia    . Atrial fibrillation (CMS HCC)    . BPH associated with nocturia    . CAD (coronary artery disease)    . Chronic depression 05/23/2018   . CKD (chronic kidney disease)    . Diet-controlled diabetes mellitus (CMS [place]) 05/23/2018   . DVT (deep venous thrombosis) (CMS HCC)    . Gastroesophageal reflux disease without esophagitis 05/23/2018   . Insomnia 05/23/2018   . 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 Pericardial  effusion    . Pulmonary emboli (CMS [facility])          Past Surgical History:   Procedure Laterality Date   . HX CHOLECYSTECTOMY     . HX CORONARY ARTERY BYPASS GRAFT     . HX HEART SURGERY  12/2015   . HX HERNIA REPAIR      Inguinal    . HX LIPOMA RESECTION      Chest   . OTHER SURGICAL HISTORY Left 01/2016    Evacuation of hematoma -Left Atrium  at [facility]    . SKIN CANCER EXCISION  06/21/2013    melanoma/ear/[facility]   . TRANSURETHRAL MICROWAVE THERAPY  05/24/2018    Dr Leandro Reasoner         allopurinoL (ZYLOPRIM) 100 mg Oral Tablet, TAKE 1 TABLET DAILY  amLODIPine (NORVASC) 10 mg Oral Tablet, Take 1 Tablet (10 mg total) by mouth Once a day  ascorbic acid (VITAMIN C) 1,000 mg Oral Tablet, Take 500 mg by mouth Once a day  ascorbic acid, vitamin C, (VITAMIN C) 500 mg Oral Tablet, 500 mg Once a day   aspirin (ECOTRIN) 81 mg Oral Tablet, Delayed Release (E.C.), Once a day  azelastine (OPTIVAR) 0.05 % Ophthalmic Drops,  Calcium Carbonate (OS-CAL) 650 mg calcium (1,625 mg) Oral Tablet, Every evening   Cholecalciferol, Vitamin D3, (VITAMIN D-3) 50 mcg (2,000 unit) Oral Capsule, Take by mouth  cholestyramine-sucrose (QUESTRAN) 4 gram Oral Powder in Packet, 1 PACKET BY MOUTH DAILY  citalopram (CELEXA) 10 mg Oral Tablet, TAKE 1 TABLET DAILY  famotidine (PEPCID) 40 mg Oral Tablet, Take 1 Tablet (40 mg total) by mouth Twice daily  fenofibrate nanocrystallized (TRICOR) 145 mg Oral Tablet, TAKE 1 TABLET DAILY  ferrous sulfate (FERATAB) 324 mg (65 mg iron) Oral Tablet, Delayed Release (E.C.), Take 1 Tablet (324 mg total) by mouth Once a day  fluorometholone (FML LIQUIFILM) 0.1 % Ophthalmic Drops, Suspension,   KRILL OIL ORAL, Take by mouth  LUTEIN ORAL, Take by mouth  Magnesium 250 mg Oral Tablet, Take 500 mg by mouth Once a day   melatonin 1 mg Oral Tablet, As directed  multivitamin (CENTRUM SILVER) 400-250 mcg Oral Tablet, Chewable, Take 1 Tab by mouth Once a day Taking different brank with no vit K  nitroGLYCERIN (NITROSTAT) 0.4 mg Sublingual  Tablet, Sublingual, 1 Tab (0.4 mg total) by Sublingual route Every 5 minutes as needed for Chest pain for 3 doses over 15 minutes  triamcinolone acetonide (ARISTOCORT A) 0.1 % Ointment, APPLY TO AFFECTED AREA TWICE A DAY AS NEEDED  UBIDECARENONE (COQ-10 ORAL), Once a day  warfarin (COUMADIN) 10 mg Oral Tablet, Daily or as directed  Warfarin (COUMADIN) 4 mg Oral Tablet, Daily or as directed    No facility-administered medications prior to visit.    Allergies   Allergen Reactions   . Gluten      Celiac disease     Family Medical History:     Problem Relation (Age of Onset)    Emphysema Father    Heart Attack General Family Hx    Heart Disease Mother    Hypertension (High Blood Pressure) Mother    No Known Problems Other    Stroke Mother            Social History     Tobacco Use   . Smoking status: Never Smoker   . Smokeless tobacco: Never Used   Vaping Use   . Vaping Use: Never used   Substance Use Topics   . Alcohol use: Not Currently     Comment: rare   . Drug use: Never       Review of Systems  Review of Systems   Constitutional: Positive for malaise/fatigue and weight loss. Negative for chills and fever.   Cardiovascular: Negative for chest pain and leg swelling.   Respiratory: Negative for cough and shortness of breath.    Hematologic/Lymphatic: Negative for bleeding problem.   Gastrointestinal: Positive for abdominal pain (mild chronic ). Negative for change in bowel habit.   Psychiatric/Behavioral: Negative for depression. The patient is not nervous/anxious.        Physical Exam:  BP 112/82   Pulse 76   Temp 35.9 C (96.6 F) (Thermal Scan)   Ht 1.88 m (6\' 2" )   Wt 102 kg (225 lb)   SpO2 97%   BMI 28.89 kg/m       Physical Exam  Vitals and nursing note reviewed.   Constitutional:       General: He is not in acute distress.     Appearance: Normal appearance.   Neck:      Vascular: No carotid bruit.   Cardiovascular:      Rate and Rhythm: Normal rate and regular rhythm.  Pulmonary:      Effort: Pulmonary  effort is normal.      Breath sounds: Normal breath sounds. No wheezing.   Abdominal:      General: Bowel sounds are normal.      Palpations: Abdomen is soft. There is no mass.      Tenderness: There is no abdominal tenderness.   Musculoskeletal:      Cervical back: Neck supple.      Right lower leg: No edema.      Left lower leg: No edema.   Lymphadenopathy:      Cervical: No cervical adenopathy.   Neurological:      Mental Status: He is alert and oriented to person, place, and time.   Psychiatric:         Mood and Affect: Mood normal.         Assessment/Plan:    1. BMI 28.0-28.9,adult      2. 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 Stage 3 chronic kidney disease  Stable continue to monitor   - CBC/DIFF; Future    4. Mixed hyperlipidemia  Continue with fenofibrate and nutrition monitor labs   - LIPID PANEL; Future  - COMPREHENSIVE METABOLIC PNL, FASTING; Future  - VITAMIN D 25 TOTAL; Future  - THYROID STIMULATING HORMONE WITH FREE T4 REFLEX; Future    5. Essential hypertension  Stable with current medications continue same   - MAGNESIUM; Future    6. Celiac disease  Doing fairly well with changes with nutritionist monitor labs   - VITAMIN B12; Future    7. Gastroesophageal reflux disease, unspecified whether esophagitis present  Stable     8. Diet-controlled diabetes mellitus (CMS [place])  Continue to work with nutrition monitor labs A1c below 7 goal  - HGA1C (HEMOGLOBIN A1C WITH EST AVG GLUCOSE); Future    9. Pulmonary embolism, unspecified chronicity, unspecified pulmonary embolism type, unspecified whether acute cor pulmonale present (CMS HCC)  Stable with coumadin monitored by cardiology     10. Coronary artery disease involving native coronary artery of native heart with unstable angina pectoris (CMS HCC)  Continues to do well follow with cardiology     11. Anemia, unspecified type  Continue with supplements monitor labs adjust as needed  - VITAMIN B12; Future  -  FERRITIN; Future       Orders Placed This Encounter   . CBC/DIFF   . LIPID PANEL   . MAGNESIUM   . COMPREHENSIVE METABOLIC PNL, FASTING   . VITAMIN D 25 TOTAL   . VITAMIN B12   . THYROID STIMULATING HORMONE WITH FREE T4 REFLEX   . HGA1C (HEMOGLOBIN A1C WITH EST AVG GLUCOSE)   . FERRITIN            Follow up: No follow-ups on file.  Seek medical attention for new or worsening symptoms.  Patient has been seen in this clinic within the last 3 years.     [name], MD          This note was partially created using MModal Fluency Direct system (voice recognition software) and is inherently subject to errors including those of syntax and "sound-alike" substitutions which may escape proofreading.  In such instances, original meaning may be extrapolated by contextual derivation.

## 2020-06-11 NOTE — Nursing Note (Signed)
06/11/20 0800   Medicare Wellness Assessment   Medicare initial or wellness physical in the last year? No   Advance Directives   Does patient have a living will or MPOA no   Has patient provided [employer] with a copy? no   Advance directive information given to the patient today? YES   Activities of Daily Living   Do you need help with dressing, bathing, or walking? No   Do you need help with shopping, housekeeping, medications, or finances? No   Do you have rugs in hallways, broken steps, or poor lighting? No   Do you have grab bars in your bathroom, non-slip strips in your tub, and hand rails on your stairs? No   Cognitive Function Screen   What is you age? 1   What is the time to the nearest hour? 1   What is the year? 1   What is the name of this clinic? 1   Can the patient recognize two persons (the doctor, the nurse, home help, etc.)? 1   What is the date of your birth? (day and month sufficient)  1   In what year did World War II end? 1   Who is the current president of the [place]? 1   Count from 20 down to 1? 1   What address did I give you earlier? 0   Total Score 9   Interpretation of Total Score Greater than 6 Normal   Depression Screen   Little interest or pleasure in doing things. 0   Feeling down, depressed, or hopeless 0   PHQ 2 Total 0   Hearing Screen   Have you noticed any hearing difficulties? Yes   After whispering 9-1-6 how many numbers did the patient repeat correctly? 3   Fall Risk Assessment   Do you feel unsteady when standing or walking? Yes   Do you worry about falling? No   Have you fallen in the past year? No   Timed up and go test (in seconds) 5   Vision Screen   Right Eye = 20 15   Left Eye = 20 15

## 2020-06-11 NOTE — Ancillary Notes (Signed)
Department of Community Practice     Venipuncture performed in office on left  arm antecubital vein, dry pressure dressing was applied to site and patient tolerated it well.  Specimen was centrifuged, aliquoted as needed and specimen was labeled and packaged for transport.    Leeann Must  06/11/2020, 09:29

## 2020-06-12 ENCOUNTER — Encounter (HOSPITAL_BASED_OUTPATIENT_CLINIC_OR_DEPARTMENT_OTHER): Payer: Self-pay | Admitting: Cardiovascular Disease

## 2020-06-16 ENCOUNTER — Ambulatory Visit (INDEPENDENT_AMBULATORY_CARE_PROVIDER_SITE_OTHER): Payer: Medicare Other

## 2020-06-16 ENCOUNTER — Other Ambulatory Visit: Payer: Self-pay

## 2020-06-16 DIAGNOSIS — Z86718 Personal history of other venous thrombosis and embolism: Secondary | ICD-10-CM

## 2020-06-16 LAB — POCT INR (RESULTS): POCT INR: 3.4

## 2020-06-18 ENCOUNTER — Encounter (INDEPENDENT_AMBULATORY_CARE_PROVIDER_SITE_OTHER): Payer: Self-pay

## 2020-06-18 ENCOUNTER — Telehealth (INDEPENDENT_AMBULATORY_CARE_PROVIDER_SITE_OTHER): Payer: Self-pay | Admitting: Family

## 2020-06-18 NOTE — Telephone Encounter (Signed)
I spoke with pt and reminded him of the need to hold his coumadin and aspirin 5 days prior to his procedure on 07-17-20. Pt was concerned about stopping the Coumadin for so long but I advised him that Dr.Mason was going to give him Lovenox to fill that time frame.  Pt was instructed to contact Dr.Masons office in regard to bridging him with Lovenox. No further questions or concerns.  Sampson Goon, LPN

## 2020-06-29 ENCOUNTER — Other Ambulatory Visit (INDEPENDENT_AMBULATORY_CARE_PROVIDER_SITE_OTHER): Payer: Medicare Other

## 2020-06-29 ENCOUNTER — Other Ambulatory Visit: Payer: Self-pay

## 2020-06-29 DIAGNOSIS — Z136 Encounter for screening for cardiovascular disorders: Secondary | ICD-10-CM

## 2020-06-29 DIAGNOSIS — Z8249 Family history of ischemic heart disease and other diseases of the circulatory system: Secondary | ICD-10-CM

## 2020-06-30 ENCOUNTER — Telehealth (INDEPENDENT_AMBULATORY_CARE_PROVIDER_SITE_OTHER): Payer: Self-pay | Admitting: Nurse Practitioner

## 2020-06-30 ENCOUNTER — Other Ambulatory Visit (INDEPENDENT_AMBULATORY_CARE_PROVIDER_SITE_OTHER): Payer: Self-pay | Admitting: Family Medicine

## 2020-06-30 DIAGNOSIS — N183 Chronic kidney disease, stage 3 unspecified (CMS HCC): Secondary | ICD-10-CM

## 2020-06-30 NOTE — Telephone Encounter (Signed)
Informed pt that effective April 15, 2019 any patient having an EGD will require a COVID 19 test. The test needs done 07/15/20.   Informed pt of date of testing and that if they are having the testing done 2 days prior, testing needs to be done before noon.    Provided patient with the directions to the COVID19 tent located on the "E" lot on campus.   To get to the "E" lot: turn in at [facility] building at [address] [address] [place] and go straight back to the railroad tracks and turn right.and that the tent is open 10am - 4pm    Informed pt that the procedure will be cancelled if they do not get the testing done, and that an order is already placed for this test.     Pt verbalized understanding. No concerns at this time. Encouraged to call back with any.       Nohlan Burdin, LPN

## 2020-07-04 ENCOUNTER — Other Ambulatory Visit (INDEPENDENT_AMBULATORY_CARE_PROVIDER_SITE_OTHER): Payer: Self-pay | Admitting: Family Medicine

## 2020-07-06 ENCOUNTER — Other Ambulatory Visit (HOSPITAL_BASED_OUTPATIENT_CLINIC_OR_DEPARTMENT_OTHER): Payer: Self-pay | Admitting: Cardiovascular Disease

## 2020-07-06 ENCOUNTER — Ambulatory Visit (INDEPENDENT_AMBULATORY_CARE_PROVIDER_SITE_OTHER): Payer: Medicare Other

## 2020-07-06 ENCOUNTER — Other Ambulatory Visit: Payer: Self-pay

## 2020-07-06 DIAGNOSIS — Z86718 Personal history of other venous thrombosis and embolism: Secondary | ICD-10-CM

## 2020-07-06 LAB — POCT INR (RESULTS): POCT INR: 3.4

## 2020-07-06 NOTE — Telephone Encounter (Signed)
Last scheduled appointment with you was 06/11/2020.  Currently scheduled future appointment is 12/14/2020.    Confirmed preferred pharmacy for this refill encounter is   [facility] [place] [address]    [place] [address]    Phone: [phone] Fax: [phone]    Hours: Not open 24 hours    [id number]   [address]   Phone: [phone]: [phone]   Hours: Not open 24 hours      .     RJanora Norlander MA  07/06/2020, 07:47

## 2020-07-07 MED ORDER — ENOXAPARIN 100 MG/ML SUBCUTANEOUS SYRINGE
100.0000 mg | INJECTION | Freq: Two times a day (BID) | SUBCUTANEOUS | 0 refills | Status: DC
Start: 2020-07-07 — End: 2020-08-04

## 2020-07-14 ENCOUNTER — Other Ambulatory Visit: Payer: Self-pay

## 2020-07-14 ENCOUNTER — Telehealth (INDEPENDENT_AMBULATORY_CARE_PROVIDER_SITE_OTHER): Payer: Self-pay

## 2020-07-14 NOTE — Telephone Encounter (Signed)
Patient would like a return call, he has a few questions about his upcoming procedure on 07/17/2020.

## 2020-07-14 NOTE — Telephone Encounter (Signed)
Called and left message for patient to return call    Allexus Ovens,LPN

## 2020-07-14 NOTE — Telephone Encounter (Signed)
Called and went over Lovenox usage with patient and advised patient to call Dr Worthy Keeler and go over it with them also, verbalized understanding    Alda Lea

## 2020-07-15 ENCOUNTER — Ambulatory Visit: Payer: Medicare Other | Attending: Family

## 2020-07-15 DIAGNOSIS — Z20822 Contact with and (suspected) exposure to covid-19: Secondary | ICD-10-CM | POA: Insufficient documentation

## 2020-07-15 DIAGNOSIS — Z01812 Encounter for preprocedural laboratory examination: Secondary | ICD-10-CM | POA: Insufficient documentation

## 2020-07-15 DIAGNOSIS — Z01818 Encounter for other preprocedural examination: Secondary | ICD-10-CM

## 2020-07-16 ENCOUNTER — Encounter (HOSPITAL_COMMUNITY): Payer: Self-pay | Admitting: Gastroenterology

## 2020-07-16 LAB — COVID-19 ~~LOC~~ MOLECULAR LAB TESTING: SARS-CoV-2: NOT DETECTED

## 2020-07-17 ENCOUNTER — Ambulatory Visit (HOSPITAL_COMMUNITY): Payer: Medicare Other | Admitting: Anesthesiology

## 2020-07-17 ENCOUNTER — Other Ambulatory Visit: Payer: Self-pay

## 2020-07-17 ENCOUNTER — Encounter (HOSPITAL_COMMUNITY)
Admission: RE | Disposition: A | Discharge status: Home / Self Care | Payer: Self-pay | Source: Ambulatory Visit | Attending: Gastroenterology

## 2020-07-17 ENCOUNTER — Encounter (HOSPITAL_COMMUNITY): Payer: Self-pay | Admitting: Gastroenterology

## 2020-07-17 ENCOUNTER — Inpatient Hospital Stay
Admission: RE | Admit: 2020-07-17 | Discharge: 2020-07-17 | Disposition: A | Discharge status: Home / Self Care | Payer: Medicare Other | Source: Ambulatory Visit | Attending: Gastroenterology | Admitting: Gastroenterology

## 2020-07-17 DIAGNOSIS — K222 Esophageal obstruction: Secondary | ICD-10-CM | POA: Insufficient documentation

## 2020-07-17 DIAGNOSIS — D649 Anemia, unspecified: Secondary | ICD-10-CM | POA: Insufficient documentation

## 2020-07-17 DIAGNOSIS — K529 Noninfective gastroenteritis and colitis, unspecified: Secondary | ICD-10-CM

## 2020-07-17 DIAGNOSIS — K648 Other hemorrhoids: Secondary | ICD-10-CM | POA: Insufficient documentation

## 2020-07-17 DIAGNOSIS — K2980 Duodenitis without bleeding: Secondary | ICD-10-CM | POA: Insufficient documentation

## 2020-07-17 DIAGNOSIS — K5730 Diverticulosis of large intestine without perforation or abscess without bleeding: Secondary | ICD-10-CM

## 2020-07-17 DIAGNOSIS — Z8719 Personal history of other diseases of the digestive system: Secondary | ICD-10-CM

## 2020-07-17 DIAGNOSIS — K219 Gastro-esophageal reflux disease without esophagitis: Secondary | ICD-10-CM | POA: Insufficient documentation

## 2020-07-17 DIAGNOSIS — R194 Change in bowel habit: Secondary | ICD-10-CM | POA: Insufficient documentation

## 2020-07-17 DIAGNOSIS — D509 Iron deficiency anemia, unspecified: Secondary | ICD-10-CM | POA: Insufficient documentation

## 2020-07-17 DIAGNOSIS — K449 Diaphragmatic hernia without obstruction or gangrene: Secondary | ICD-10-CM | POA: Insufficient documentation

## 2020-07-17 DIAGNOSIS — K2100 Gastro-esophageal reflux disease with esophagitis, without bleeding: Secondary | ICD-10-CM | POA: Insufficient documentation

## 2020-07-17 LAB — PT/INR
INR: 1.07 (ref <=5.00)
PROTHROMBIN TIME: 12.9 s (ref 9.7–13.6)

## 2020-07-17 SURGERY — GASTROSCOPY WITH BIOPSY
Anesthesia: Monitor Anesthesia Care | Wound class: Clean Contaminated Wounds-The respiratory, GI, Genital, or urinary

## 2020-07-17 MED ORDER — PROPOFOL 10 MG/ML IV BOLUS
INJECTION | Freq: Once | INTRAVENOUS | Status: DC | PRN
Start: 2020-07-17 — End: 2020-07-17
  Administered 2020-07-17 (×3): 25 mg via INTRAVENOUS
  Administered 2020-07-17: 50 mg via INTRAVENOUS
  Administered 2020-07-17 (×11): 25 mg via INTRAVENOUS

## 2020-07-17 MED ORDER — LACTATED RINGERS INTRAVENOUS SOLUTION
INTRAVENOUS | Status: DC | PRN
Start: 2020-07-17 — End: 2020-07-17
  Administered 2020-07-17: 0 via INTRAVENOUS

## 2020-07-17 MED ORDER — ONDANSETRON HCL (PF) 4 MG/2 ML INJECTION SOLUTION
INTRAMUSCULAR | Status: AC
Start: 2020-07-17 — End: 2020-07-17
  Filled 2020-07-17: qty 2

## 2020-07-17 MED ORDER — ONDANSETRON HCL (PF) 4 MG/2 ML INJECTION SOLUTION
Freq: Once | INTRAMUSCULAR | Status: DC | PRN
Start: 2020-07-17 — End: 2020-07-17
  Administered 2020-07-17: 4 mg via INTRAVENOUS

## 2020-07-17 MED ORDER — LIDOCAINE HCL 20 MG/ML (2 %) INJECTION SOLUTION
Freq: Once | INTRAMUSCULAR | Status: DC | PRN
Start: 2020-07-17 — End: 2020-07-17
  Administered 2020-07-17: 40 mg via INTRAVENOUS

## 2020-07-17 MED ORDER — PROPOFOL 10 MG/ML INTRAVENOUS EMULSION
INTRAVENOUS | Status: AC
Start: 2020-07-17 — End: 2020-07-17
  Filled 2020-07-17: qty 40

## 2020-07-17 MED ORDER — SODIUM CHLORIDE 0.9 % (FLUSH) INJECTION SYRINGE
3.0000 mL | INJECTION | INTRAMUSCULAR | Status: DC | PRN
Start: 2020-07-17 — End: 2020-07-17

## 2020-07-17 MED ORDER — LACTATED RINGERS INTRAVENOUS SOLUTION
1000.0000 mL | INTRAVENOUS | Status: DC
Start: 2020-07-17 — End: 2020-07-17
  Administered 2020-07-17: 1000 mL via INTRAVENOUS

## 2020-07-17 MED ORDER — SODIUM CHLORIDE 0.9 % (FLUSH) INJECTION SYRINGE
3.0000 mL | INJECTION | Freq: Three times a day (TID) | INTRAMUSCULAR | Status: DC
Start: 2020-07-17 — End: 2020-07-17

## 2020-07-17 SURGICAL SUPPLY — 59 items
APPL ENDOS 40CM 360D SPRAY SET SNPLK GAS AST TIP DUPLOSPRAY MIS LF (GI LAB SUPPLIES) IMPLANT
CAPSULE PH BRAVO CALIBRATE FRE E RFLX DEL SYS (GI LAB SUPPLIES)
CAPSULE PH BRAVO CALIBRATE FREE RFLX DEL SYS (GI LAB SUPPLIES) IMPLANT
CAPSULE PH BRAVO CALIBRATE FRE_E RFLX DEL SYS (GI LAB SUPPLIES)
CATH CRE 6-7-8MM 7.5FR 5.5CM 240CM 2.8MM 3.2MM LOW PROF GW BAL DIL ESOPH PYL BIL PEBAX STRL LF  DISP (Dilators) IMPLANT
CATH ELHMST GLD PROBE 7FR 300CM BIPOLAR RND DIST TIP STD CONN FIRM SHAFT HMGLD STRL DISP 2.8MM MN (DIAGNOSTIC) IMPLANT
CATH ELHMST GLD PROBE 7FR 300C_M BIPOLAR RND DIST TIP STD (DIAGNOSTIC)
CATH PH MONITORING VERSAFLEX Z 6FR ZNIS+8R 8 RING DISP (DIAGNOSTIC) IMPLANT
CATH PH MONITORING VERSAFLEX Z_6FR ZNIS+8R 8 RING DISP (DIAGNOSTIC)
CLIP HMST 2.8MM REPST DURACLIP 235CM 16MM CSCP (ENDOSCOPIC SUPPLIES) IMPLANT
CLIP HMST DURACLIP L16 MM_OD2.8 MM REPST CSCP (INSTRUMENTS ENDOMECHANICAL)
CLIP HMST MR CONDITIONAL BRD CATH ROT CONTROL KNOB NO SHEATH RSL 360 235CM 2.8MM 11MM OPN (SURGICAL INSTRUMENTS) IMPLANT
CLIP HMST RADOPQ PRELD STRL DISP RSL 235CM 2.8MM 11MM OPN (SURGICAL INSTRUMENTS) IMPLANT
DEVICE HEMOSTASIS CLIP 360 235CM RESOLUTION (INSTRUMENTS)
DEVICE RESOLUTION CLIP 235CML M00522611 11MMW 10EA/BX (INSTRUMENTS)
DILATOR ENDOS CRE 180CM 8CM 10-11-12MM 6FR ESOPH BAL LOW PROF FIX WRE PEBAX STRL LF  DISP 2.8MM (BALLOON) IMPLANT
DILATOR ENDOS CRE 180CM 8CM 10_-11-12MM 6FR ESOPH BAL LOW (BALLOON)
DILATOR ENDOS CRE 240CM 5.5CM 11-13.5-15MM 7.5FR ESOPH PYL (BALLOON)
DILATOR ENDOS CRE 240CM 5.5CM 11-13.5-15MM 7.5FR ESOPH PYL BIL BAL LOW PROF GW PEBAX STRL LF  DISP (BALLOON) IMPLANT
DILATOR ENDOS CRE 240CM 5.5CM 15-16.5-18MM 7.5FR ESOPH PYL BIL BAL LOW PROF GW PEBAX STRL LF  DISP (BALLOON) IMPLANT
DILATOR ENDOS CRE 240CM 5.5CM 18-19-20MM 7.5FR ESOPH PYL BIL (BALLOON)
DILATOR ENDOS CRE 240CM 5.5CM 18-19-20MM 7.5FR ESOPH PYL BIL BAL LOW PROF GW PEBAX STRL LF  DISP 2.8 (BALLOON) IMPLANT
DILATOR ENDOS CRE 240CM 5.5CM 8-9-10MM 7.5FR ESOPH PYL BIL BAL LOW PROF GW PEBAX STRL LF  DISP 2.8MM (BALLOON) IMPLANT
DILATOR ENDOS CRE 240CM 5.5CM_15-16.5-18MM 7.5FR ESOPH PYL (BALLOON)
DILATOR ENDOS CRE 240CM 5.5CM_8-9-10MM 7.5FR ESOPH PYL BIL (BALLOON)
DILATOR ESOPH BAL 6/7/8MM M00558660 7.5FR 240CML (Dilators)
DISC NO SUB - FORCEPS BIOPSY MLT SAMPLE 240CM 2.4MM MTBT 2.8MM STRL DISP ORNG (ENDOSCOPIC SUPPLIES) IMPLANT
DISC RECALLED - KIT SYRG SYRG KIT TWN PK 23G O_RISE INTJCT 10ML DISP CLR GEL (ENDOSCOPIC SUPPLIES) IMPLANT
ELECTRODE ESURG 85SQ CM 4MR NESSY OMG NONST DISP RTN PLATE NEUTRALIZE CABLE PLUG LF (CARDIAC) IMPLANT
FORCEPS BIOPSY 160CM 1.8MM RJ 4 PED 2+ MM DISP GASTROSCOPIC (SURGICAL INSTRUMENTS) IMPLANT
FORCEPS BIOPSY 160CM 1.8MM RJ_4 PED 2+ MM DISP GASTROSCOPIC (INSTRUMENTS)
FORCEPS ENDOS HOT PRCS BITE 24_0CM 2.8MM 2.2MM RJ 4 CUP DISP (ENDOSCOPIC SUPPLIES) IMPLANT
FORCEPS ENDOS HOT PRCS BITE 24_0CM 2.8MM 2.2MM RJ 4 CUP DISP (INSTRUMENTS ENDOMECHANICAL)
GW ENDOS 200CM SAVARY FLX TP SS NONST (INSTRUMENTS ENDOMECHANICAL)
GW URO 200CM SAVARY FLX TP SS NONST (ENDOSCOPIC SUPPLIES) IMPLANT
KIT SYRG SYRG KIT TWN PK 23G O_RISE INTJCT 10ML DISP CLR GEL (INSTRUMENTS ENDOMECHANICAL)
LIGATOR 2.8MM 8.6-11.5MM SSS7 ESOPH 1 STNG MLT BAND HNDL STRL DISP ENDOS HMSTS LF (GI LAB SUPPLIES) IMPLANT
LIGATOR 2.8MM 8.6-11.5MM SSS7_ESOPH 1 STNG MLT BAND ERG HNDL (GI LAB SUPPLIES)
MANIFLD SUCT NPTN 2 2 STD 4 PORT WASTE MGMT SYS NONST LF  DISP (Suction) IMPLANT
MANIFLD SUCT NPTN 2 STD 4 PORT_WASTE MGMT SYS NONST LF DISP (Suction)
MARKER ENDOS SPOT EX PERM IND DRK SYRG 5ML (GENE) IMPLANT
MARKER ENDOS SPOT EX PREFL PRE ASSEMBLE SYRG PERM (GENE)
NEEDLE SCLRTX 25GA 2.3MM CSCP OPTC TIP STRL LF  DISP FLXTP 5MM 230CM STD (NEEDLES & SYRINGE SUPPLIES) IMPLANT
NEEDLE SCLRTX 25GA 2.3MM CSCP OPTC TIP STRL LF DISP FLXTP 5 (NEEDLES & SYRINGE SUPPLIES)
NEEDLE SCLRTX 25GA 2.3MM INJ S_TRL LF DISP FLXTP 5MM 160MM (NEEDLES & SYRINGE SUPPLIES)
NEEDLE SCLRTX 25GA 2.3MM STRL DISP FLXTP 5MM 160CM STD (NEEDLES & SYRINGE SUPPLIES) IMPLANT
NET SPEC RETR 230CM 2.5MM RTHNT SHEATH 6X3CM PLYP NONST LF  DISP (SURGICAL INSTRUMENTS) IMPLANT
NET SPEC RETR 230CM 2.5MM RTHN_T FLXB DRBLE PCKT 6X3CM PLYP (INSTRUMENTS)
PROBE COAG 7.2FT 6.9FR FIAPC CRCMF PLUG PLAY FUNCTIONALITY FILTER (ENDOSCOPIC SUPPLIES) IMPLANT
PROBE ESURG 300CM 2.3MM FIAPC FLXB CRCMF STRL DISP (SURGICAL INSTRUMENTS) IMPLANT
PROBE ESURG 7.2FT 6.9FR FIAPC_CRCMF PLUG PLAY FUNCTIONALITY (INSTRUMENTS ENDOMECHANICAL)
SNARE RND 240CM 2.4MM CAPTIVATR COLD STF THN WRE ENDOS PLPCTM 10MM DISP (ENDOSCOPIC SUPPLIES) IMPLANT
SNARE SURG 10MM RND COLD (INSTRUMENTS ENDOMECHANICAL)
SYRINGE INFLAT ALN II GA STRL DISP 60ML (NEEDLES & SYRINGE SUPPLIES) IMPLANT
SYRINGE INFLAT ALN II GA STRL_DISP 60ML (NEEDLES & SYRINGE SUPPLIES)
TIP APPL 40CM SS DUPLOSPRAY (SEALANTS) IMPLANT
USE ITEM 343174 SNARE RND 240CM 2.4MM CAPTIVATR COLD STF THN WRE ENDOS PLPCTM 10MM DISP (ENDOSCOPIC SUPPLIES) IMPLANT
WATER STRL 1000ML PLASTIC PR BTL LF (SOLUTIONS) IMPLANT
WATER STRL 1000ML PLASTIC PR B_TL LF (SOLUTIONS)

## 2020-07-17 NOTE — OR Surgeon (Signed)
PATIENT NAME: Dustin Webster NUMBER:  [id number]  DATE OF SERVICE: 07/17/2020  DATE OF BIRTH:  21-Jun-1956    OPERATIVE REPORT    NAME OF PROCEDURE:  Esophagogastroduodenoscopy with biopsy and balloon dilatation of the esophagus, random biopsy of the colon.    ATTENDING/REFERRING PHYSICIAN:  Lanny Cramp, MD.    REASON FOR PROCEDURES:  1. Dyspepsia, chronic gastroesophageal reflux disease.  2. Chronic diarrhea, personal history of celiac disease.    ENDOSCOPIST:  Deborra Medina Shaunak Kreis, DO.    TYPE OF SCOPES:  Video gastroscope and video colonoscope.    ANESTHESIA:  MAC sedation.    SPECIMENS:  There were biopsies obtained of the distal esophagus as well as a Maloney dilatation performed of the esophagus using a 56-French Maloney dilator.  There were random biopsies obtained of the colon to exclude microscopic colitis.    DESCRIPTION OF PROCEDURE:  Prior to the procedure, risks of the procedure as well as alternatives were explained to the patient.  The patient was placed in a left lateral decubitus position and was given MAC sedation.  After adequate anesthesia was obtained, a video gastroscope was placed under direct technique into the esophagus.  The esophagus was reviewed in its entirety and revealed changes of reflux esophagitis.  There was obvious evidence of a Schatzki ring.  There was reflux esophagitis present.  This would be grade B by the [facility] classification.  The scope was advanced through the GE junction, along the greater curvature of the stomach into the antrum.  The antrum was unremarkable.  The scope was advanced through the pylorus into the first and second portions of the duodenum.  There were changes of chronic duodenitis, but I did not see any changes of villous atrophy.  The patient has to be following a gluten-free diet.  The scope was then pulled back into the stomach and retroflexed.  The cardiac and fundal portions were unremarkable.  There was a small hiatal hernia seen.  The  scope was then straightened, the stomach decompressed.  The scope was pulled back into the distal esophagus, where I then did biopsies the distal esophagus and then dilated the area using a 56-French Maloney dilator.  Upon completion of the dilatation, the scope was then withdrawn and the procedure then terminated.    IMPRESSION:  1. Reflux esophagitis grade B by the [facility].  2. Schatzki ring.  3. Small sliding hiatal hernia.    DESCRIPTION OF PROCEDURE:  At this point the patient's bed was rotated 90 degrees and a rectal exam was performed.  The rectal exam was unremarkable, no perianal disease.  The scope was advanced into the rectum without difficulty.  The scope traversed the rectum into the sigmoid colon, descending colon, transverse, ascending colon, and cecum.  The cecum was localized by landmarks.  I could clearly see the appendiceal orifice and the ileocecal valve.  The prep was adequate.  There were subtle changes of chronic colitis noted by observing the mucosa, especially over in the ascending colon.  Multiple biopsies were obtained.  There were no mass lesions or polyps noted.  As the scope was slowly withdrawn, the lumen was kept in view at all times.  There was moderate diverticulosis present in the sigmoid.  At the level of the rectum, the scope was then retroflexed.  There were small internal hemorrhoids.  The scope was then straightened.  The colon was then decompressed.  The procedure was then terminated.  IMPRESSION:  1. Subtle chronic colitis, especially noted in the ascending colon.  2. Moderate diverticulosis of the sigmoid.  3. Small internal hemorrhoids.    RECOMMENDATIONS:  Continue same diet.  Await biopsy results.  Further recommendations depending on pathology.        Scarlette Shorts, DO              CC:   Lanny Cramp, MD   Fax: [phone]     Scarlette Shorts, DO   Fax: [phone]       DD:  07/17/2020 10:31:36  DT:  07/17/2020 12:49:11 LB  D#:  [id number]

## 2020-07-17 NOTE — Discharge Instructions (Signed)
Patient Education   COLONOSCOPY/ SIGMOIDOSCOPY  RESTRICTION ON ACTIVITY  IF SEDATED:  Do not drive a car or operate machinery until the day after the procedure.  Following day: Return to full activity including work.    DIET:  Eat and drink normally unless instructed otherwise.    TREATMENT FOR COMMON SIDE EFFECTS:    Mild abdominal pain, bloating or excess gas: Rest, eat lightly and use a heating pad.    Symptoms to watch for and report to your physician:  1. Chills or fever occurring within 24 hours after procedure  2. Severe abdominal pain or bloating  3. A large amount of rectal bleeding. (A small amount of blood from the rectum is not serious, especially if hemorrhoids are present).  4. Pain in the chest.    If a polyp has been removed:  If bright red rectal bleeding occurs, call your physician.  For 3 days: No heavy lifting, straining, or running.    If scheduled for a Barium Enema, you will be given an instruction sheet in radiology.    If you have any problems, questions or concerns contact your physician.  If you are unable to reach your physician and feel you need immediate attention, go to the Emergency Room.      ESOPHAGOGASTRODUODENOSCOPY    RESTRICTION ON ACTIVITY  IF SEDATED:  Do not drive a car or operate machinery until the day after the procedure.  Following day: Return to full activity including work.    DIET:  Eat and drink normally unless instructed otherwise.    Treatment for common after effects:    Sore Throat: Treat with Throat Lozenges                          Gargle with warm salt water  Mild Abdominal pain and bloating: Rest and take only Liquids.    Symptoms to watch for and report to your physician:  5. Chills or fever occurring within 24 hours after procedure  6. Severe abdominal pain or bloating  7. Pain in the chest.      If you have any problems, questions or concerns contact your physician.  If you are unable to reach your physician and feel you need immediate attention, go to the  Emergency Room.    Anesthesia: Monitored Anesthesia Care (MAC)    You're due to have surgery. During surgery, you'll be given medicine called anesthesia. This will keep you comfortable and pain-free. Your surgeon will use monitored anesthesia care (MAC). This sheet tells you more about this type of anesthesia.  What is monitored anesthesia care?  MAC keeps you very drowsy during surgery. You may be awake, but you will likely not remember much. And you won't feel pain. With MAC, medicines are given through an IV line into a veinin your arm or hand. A local anesthetic will usually be injected into the skin and muscle around the surgical site to numb it. The anesthesia provider monitors you during the procedure. He or she checks your heart rate and rhythm, blood pressure, and blood oxygen level.  Anesthesia tools and medicines that may be near you during your procedure  You will likely have:   A pulse oximeter on the end of your finger. This measures your blood oxygen level.   Electrocardiography leads (electrodes)on your chest. These record your heart rate and rhythm.   Medicines given through an IV. These relax you and prevent pain. You may be   awake or sleep lightly. If you have local anesthetic, it's injected directly into your skin.   A face mask to give you oxygen, if needed.  Possible risks  MAC has some risks. These include:   Breathing problems   Nausea and vomiting   Allergic reaction to the anesthetic  Anesthesia safety  Tips for anesthesia safety include the following:   Follow all instructions you are given for how long not to eat or drink before your procedure.   Be sure your healthcare provider knows what medicines you take, especially any anti-inflammatory medicine or blood thinners. This includes aspirin and any otherover-the-counter medicines, herbs, and supplements.   Have an adult family member or friend drive you home after the procedure.   For the first 24 hours after your  surgery:  ? Don't drive or use heavy equipment.  ? Don't make important decisions or sign documents.  ? Don't drink alcohol.  ? Have someone stay with you, if possible. They can watch for problems and help keep you safe.  [employer] last reviewed this educational content on 08/14/2018   2000-2021 [employer]. All rights reserved. This information is not intended as a substitute for professional medical care. Always follow your healthcare professional's instructions.

## 2020-07-17 NOTE — Anesthesia Transfer of Care (Signed)
ANESTHESIA TRANSFER OF CARE   Dustin Webster is a 64 y.o. ,male,     had Procedure(s) with comments:  GASTROSCOPY WITH BIOPSY - Dilation with #56 Maloney  COLONOSCOPY WITH BIOPSY  performed  07/17/20   Primary Service: Harriett Rush, MD    Past Medical History:   Diagnosis Date   . Anemia    . Atrial fibrillation (CMS HCC)    . BPH associated with nocturia    . CAD (coronary artery disease)    . Chronic depression 05/23/2018   . CKD (chronic kidney disease)    . Diet-controlled diabetes mellitus (CMS [place]) 05/23/2018   . DVT (deep venous thrombosis) (CMS HCC)    . Gastroesophageal reflux disease without esophagitis 05/23/2018   . Insomnia 05/23/2018   . 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 Pericardial effusion    . Pulmonary emboli (CMS [facility])       Allergy History as of 07/17/20     TAMSULOSIN       Noted Status Severity Type Reaction    03/07/18 0804 Brantley Persons, MA 01/14/17 Deleted       01/14/17 1129 Tessa Lerner, Amy, MA 01/14/17 Active             STATINS-HMG-COA REDUCTASE INHIBITORS       Noted Status Severity Type Reaction    05/04/18 1004 [employer], [place], [place] 01/14/17 Deleted       01/14/17 1129 Renee Pain, MA 01/14/17 Active             GLUTEN       Noted Status Severity Type Reaction    05/04/18 1005 [employer], [place], [place] 05/04/18 Active       Comments: Celiac disease               I completed my transfer of care / handoff to the receiving personnel during which we discussed:  Access, Airway, All key/critical aspects of case discussed, Analgesia, Antibiotics, Expectation of post procedure, Fluids/Product, Gave opportunity for questions and acknowledgement of understanding, Labs and PMHx    Post Location: Phase II                                                                  Last OR Temp: Temperature: 36.1 C (97 F)  ABG:  POTASSIUM   Date Value Ref Range Status   06/11/2020 4.4 3.5 - 5.1 mmol/L Final   12/19/2019 3.8 3.5 - 5.1 mmol/L Final     KETONES   Date Value Ref Range Status   04/15/2020  Not Detected Not Detected mg/dL Final     CALCIUM   Date Value Ref Range Status   06/11/2020 10.1 8.4 - 10.2 mg/dL Final   12/19/2019 9.5 8.8 - 10.2 mg/dL Final     Calculated P Axis   Date Value Ref Range Status   05/07/2018 24 degrees Final     Calculated R Axis   Date Value Ref Range Status   05/07/2018 -14 degrees Final     Calculated T Axis   Date Value Ref Range Status   05/07/2018 53 degrees Final     Cath EF Estimated   Date Value Ref Range Status   05/11/2018 60 % Final  Airway:* No LDAs found *  Blood pressure 108/81, pulse 64, temperature 36.1 C (97 F), resp. rate 13, SpO2 99 %.

## 2020-07-17 NOTE — Anesthesia Preprocedure Evaluation (Signed)
ANESTHESIA PRE-OP EVALUATION  Planned Procedure: COLONOSCOPY (N/A )  GASTROSCOPY (N/A )  Review of Systems                   Pulmonary     Cardiovascular    Hypertension, CAD and atrial fibrillation ,       GI/Hepatic/Renal    GERD     Endo/Other    anemia,       Neuro/Psych/MS    depression     Cancer                     Physical Assessment      Airway       Mallampati: II      Neck ROM: full  Mouth Opening: fair.            Dental                    Pulmonary    Breath sounds clear to auscultation  (-) no rhonchi, no decreased breath sounds, no wheezes, no rales and no stridor     Cardiovascular    Rhythm: irregular  Rate: Abnormal       Other findings            Plan  ASA 2     Planned anesthesia type: MAC and IV general              Intravenous induction     Anesthesia issues/risks discussed are: Dental Injuries and PONV.  Anesthetic plan and risks discussed with patient.          Patient's NPO status is appropriate for Anesthesia.           Plan discussed with CRNA.

## 2020-07-17 NOTE — H&P (Signed)
[facility]  [address]  [place] [address]  [phone]    Short form H&P  Name: Dustin Webster  MRN: [id number]  DOB: 05-Feb-1956  Age: 64 y.o.  Date:   Referring provider:  Dr. Julien Girt    Reason for Visit:  Chronic diarrhea, possible celiac disease; recurrent dyspepsia    History of Present Illness:  Dustin Webster is a 64 y.o. male who presents today for change in bowel, GERD.    08/27/2019 H/H 13.8/42.6, MCV 87.1, MCH 28.2, ferritin 37  09/2012 EGD/Colo - Grade B esophagitis, duodenum biopsy (flattened villi with chronic inflammation and congestion favoring celiac), normal colo    States that he has had chronic diarrhea for years. States he has soft stool to loose stool. Moving bowels 3-4 times daily. Was placed on cholestyramine by PCP. Taking 1 packet daily. States that this helped. Patient states that about a month ago, he started having 6-7 bowel movements daily but this was soft stool. States within the last week he has started moving his bowels 3-4 times daily. Changing from soft stool to diarrhea. States stool is dark secondary to iron. Does relate bright red blood noted on toilet paper with wiping. Denies family history of IBD.     Patient with history of celiac disease. States he is gluten free. Has history of GERD. Was placed on Protonix but states he was taken off of it due to decreased kidney function. Was placed on zantac and states this didn't help. Changed to Pepcid and had relief. States around 8 months ago he had issues with severe epigastric pain and anemia. Was placed back on Protonix 40mg  and states symptoms went away. Has since changed back to Pepcid. Taking 20mg  BID. Having heartburn and reflux ever 2-3 days. Worse with potato chips and coffee. States he still has intermittent epigastric pain now that he is taking Pepcid. Pain was gone with Protonix. Rare nausea. Denies dysphagia.        Patient on coumadin and ASA. Followed by Dr.  Cornelia Copa    Patient History:  Past Medical History:   Diagnosis Date   . Anemia    . Atrial fibrillation (CMS HCC)    . BPH associated with nocturia    . CAD (coronary artery disease)    . Chronic depression 05/23/2018   . CKD (chronic kidney disease)    . Diet-controlled diabetes mellitus (CMS [place]) 05/23/2018   . DVT (deep venous thrombosis) (CMS HCC)    . Gastroesophageal reflux disease without esophagitis 05/23/2018   . Insomnia 05/23/2018   . 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 Pericardial effusion    . Pulmonary emboli (CMS [facility])      Past Surgical History:   Procedure Laterality Date   . HX CHOLECYSTECTOMY     . HX CORONARY ARTERY BYPASS GRAFT     . HX HEART SURGERY  12/2015   . HX HERNIA REPAIR      Inguinal    . HX LIPOMA RESECTION      Chest   . OTHER SURGICAL HISTORY Left 01/2016    Evacuation of hematoma -Left Atrium  at [facility]    . SKIN CANCER EXCISION  06/21/2013    melanoma/ear/[facility]   . TRANSURETHRAL MICROWAVE THERAPY  05/24/2018    Dr Leandro Reasoner     No current outpatient medications on file.     Allergies   Allergen Reactions   .  Gluten      Celiac disease     Family Medical History:     Problem Relation (Age of Onset)    Emphysema Father    Heart Attack General Family Hx    Heart Disease Mother    Hypertension (High Blood Pressure) Mother    No Known Problems Other    Stroke Mother        Social History     Socioeconomic History   . Marital status: Married     Spouse name: Levada Dy   . Number of children: 1   . Years of education: Not on file   . Highest education level: Not on file   Occupational History   . Occupation: Other Land     Comment: Retired   Tobacco Use   . Smoking status: Never Smoker   . Smokeless tobacco: Never Used   Vaping Use   . Vaping Use: Never used   Substance and Sexual Activity   . Alcohol use: Not Currently     Comment: rare   . Drug use: Never   . Sexual activity: Yes     Partners: Female   Other Topics Concern   . Not on file   Social History Narrative   .  Not on file     Social Determinants of Health     Financial Resource Strain:    . Difficulty of Paying Living Expenses:    Food Insecurity:    . Worried About Charity fundraiser in the Last Year:    . Arboriculturist in the Last Year:    Transportation Needs:    . Film/video editor (Medical):    Marland Kitchen Lack of Transportation (Non-Medical):    Physical Activity:    . Days of Exercise per Week:    . Minutes of Exercise per Session:    Stress:    . Feeling of Stress :    Intimate Partner Violence:    . Fear of Current or Ex-Partner:    . Emotionally Abused:    Marland Kitchen Physically Abused:    . Sexually Abused:      Review of Systems:                                      All other review of systems negative.     Physical Exam:  BP (!) 119/98   Pulse 59   Temp 36.1 C (97 F)   Resp 13   SpO2 98%       Physical Exam  Constitutional:       General: He is not in acute distress.     Appearance: He is not diaphoretic.   HENT:      Head: Normocephalic.   Eyes:      Pupils: Pupils are equal, round, and reactive to light.   Cardiovascular:      Rate and Rhythm: Normal rate and regular rhythm.      Heart sounds: Normal heart sounds. No murmur heard.     Pulmonary:      Effort: Pulmonary effort is normal. No respiratory distress.      Breath sounds: Normal breath sounds. No wheezing.   Abdominal:      General: Bowel sounds are normal. There is no distension.      Palpations: Abdomen is soft. There is no mass.      Tenderness:  There is no abdominal tenderness. There is no guarding or rebound.   Musculoskeletal:         General: Normal range of motion.   Skin:     General: Skin is warm and dry.      Findings: No erythema or rash.   Neurological:      Mental Status: He is alert and oriented to person, place, and time.   Psychiatric:         Judgment: Judgment normal.       Assessment and Plan:      ICD-10-CM    1. Change in bowel habits  R19.4     Added automatically from request for surgery [id number]   2. Anemia, unspecified type  D64.9      Added automatically from request for surgery [id number]   3. Gastroesophageal reflux disease, unspecified whether esophagitis present  K21.9     Added automatically from request for surgery [id number]     Labs and stool studies ordered to further evaluate symptoms. Will increase Pepcid to 40mg  BID. Holding off on PPI at this time due to decreased renal function. Recommended benefiber 2 teaspoons daily to regulate bowels. Patient will be scheduled for an EGD and colonoscopy to further evaluate. Discussed risks of procedure and reviewed prep instructions, patient voiced understanding and wishes to proceed. Will obtain cardiac clearance and Coumadin/ASA hold parameters from Dr. Cornelia Copa. Follow up post procedure, sooner if needed.      Harriett Rush, MD

## 2020-07-19 NOTE — Anesthesia Postprocedure Evaluation (Signed)
Anesthesia Post Op Evaluation    Patient: Dustin Webster  Procedure(s) with comments:  GASTROSCOPY WITH BIOPSY - Dilation with #56 Maloney  COLONOSCOPY WITH BIOPSY    Last Vitals:Temperature: 36.1 C (97 F) (07/17/20 0825)  Heart Rate: 53 (07/17/20 1040)  BP (Non-Invasive): 126/85 (07/17/20 1030)  Respiratory Rate: 18 (07/17/20 1040)  SpO2: 100 % (07/17/20 0174)    No complications documented.    Patient is sufficiently recovered from the effects of anesthesia to participate in the evaluation and has returned to their pre-procedure level.  Patient location during evaluation: PACU       Patient participation: complete - patient participated  Level of consciousness: awake and alert and responsive to verbal stimuli    Pain management: adequate  Airway patency: patent    Anesthetic complications: no  Cardiovascular status: acceptable  Respiratory status: acceptable  Hydration status: acceptable  Patient post-procedure temperature: Pt Normothermic   PONV Status: Absent

## 2020-07-21 ENCOUNTER — Ambulatory Visit (INDEPENDENT_AMBULATORY_CARE_PROVIDER_SITE_OTHER): Payer: Medicare Other

## 2020-07-21 ENCOUNTER — Other Ambulatory Visit: Payer: Self-pay

## 2020-07-21 DIAGNOSIS — Z86718 Personal history of other venous thrombosis and embolism: Secondary | ICD-10-CM

## 2020-07-21 LAB — POCT INR (RESULTS): POCT INR: 1.3

## 2020-07-21 LAB — SURGICAL PATHOLOGY SPECIMEN

## 2020-07-23 ENCOUNTER — Other Ambulatory Visit (INDEPENDENT_AMBULATORY_CARE_PROVIDER_SITE_OTHER): Payer: Self-pay | Admitting: Pharmacist

## 2020-07-23 ENCOUNTER — Encounter (HOSPITAL_BASED_OUTPATIENT_CLINIC_OR_DEPARTMENT_OTHER): Payer: Self-pay | Admitting: Nurse Practitioner

## 2020-07-23 ENCOUNTER — Other Ambulatory Visit: Payer: Self-pay

## 2020-07-23 ENCOUNTER — Other Ambulatory Visit (HOSPITAL_BASED_OUTPATIENT_CLINIC_OR_DEPARTMENT_OTHER): Payer: Self-pay | Admitting: Nurse Practitioner

## 2020-07-23 ENCOUNTER — Ambulatory Visit (INDEPENDENT_AMBULATORY_CARE_PROVIDER_SITE_OTHER): Payer: Medicare Other | Admitting: Nurse Practitioner

## 2020-07-23 VITALS — BP 124/78 | HR 65 | Resp 16 | Ht 75.0 in | Wt 224.6 lb

## 2020-07-23 DIAGNOSIS — E782 Mixed hyperlipidemia: Secondary | ICD-10-CM

## 2020-07-23 DIAGNOSIS — I4891 Unspecified atrial fibrillation: Secondary | ICD-10-CM

## 2020-07-23 DIAGNOSIS — I25110 Atherosclerotic heart disease of native coronary artery with unstable angina pectoris: Secondary | ICD-10-CM

## 2020-07-23 DIAGNOSIS — I10 Essential (primary) hypertension: Secondary | ICD-10-CM

## 2020-07-23 DIAGNOSIS — Z86718 Personal history of other venous thrombosis and embolism: Secondary | ICD-10-CM

## 2020-07-23 DIAGNOSIS — Z951 Presence of aortocoronary bypass graft: Secondary | ICD-10-CM

## 2020-07-23 DIAGNOSIS — I2699 Other pulmonary embolism without acute cor pulmonale: Secondary | ICD-10-CM

## 2020-07-23 LAB — POCT INR (RESULTS): POCT INR: 1.6

## 2020-07-23 MED ORDER — NITROGLYCERIN 0.4 MG SUBLINGUAL TABLET
0.4000 mg | SUBLINGUAL_TABLET | SUBLINGUAL | 3 refills | Status: DC | PRN
Start: 2020-07-23 — End: 2020-07-23

## 2020-07-23 MED ORDER — NITROGLYCERIN 0.4 MG SUBLINGUAL TABLET
0.4000 mg | SUBLINGUAL_TABLET | SUBLINGUAL | 3 refills | Status: DC | PRN
Start: 2020-07-23 — End: 2023-06-19

## 2020-07-23 MED ORDER — REPATHA SURECLICK 140 MG/ML SUBCUTANEOUS PEN INJECTOR
140.0000 mg | PEN_INJECTOR | SUBCUTANEOUS | 3 refills | Status: DC
Start: 2020-07-23 — End: 2020-07-23

## 2020-07-23 NOTE — Telephone Encounter (Signed)
Prior authorization required for prescription repatha received by [facility] on 07/23/2020.  Benefits investigation to be completed.

## 2020-07-23 NOTE — Patient Instructions (Addendum)
GOAL BP < 130/80    GOAL LDL < 70    GOAL Hgb AIC < 7.0    REPORT ANY S/S OF BLEEDING

## 2020-07-23 NOTE — Telephone Encounter (Signed)
Prior authorization for repatha submitted electronically on 07/23/2020 through CoverMyMeds to [place]. Waiting for response from payor.    [place]  07/23/2020, 14:50

## 2020-07-23 NOTE — Progress Notes (Signed)
[facility]  [address]  [place] [address]  [phone]    Date:   07/23/2020  Name: Dustin Webster  Age: 64 y.o.    REASON FOR VISIT:   Follow Up    HISTORY OF PRESENTING ILLNESS:    Dustin Webster is a 64 y.o. 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  Patient was last seen in the office in March at which time he was stable from a cardiovascular standpoint.  Today he denies any chest pain, shortness of breath, palpitations, syncope or near-syncope.  He was recently off Coumadin for a colonoscopy and had been taking Lovenox and is scheduled to have a PT INR today in our clinic.  His last hemoglobin A1c was 6.5 and last LDL was 106.       Current Outpatient Medications   Medication Sig    allopurinoL (ZYLOPRIM) 100 mg Oral Tablet TAKE 1 TABLET DAILY    amLODIPine (NORVASC) 10 mg Oral Tablet Take 1 Tablet (10 mg total) by mouth Once a day    ascorbic acid (VITAMIN C) 1,000 mg Oral Tablet Take 500 mg by mouth Once a day (Patient not taking: Reported on 07/23/2020 )    ascorbic acid, vitamin C, (VITAMIN C) 500 mg Oral Tablet 500 mg Once a day     aspirin (ECOTRIN) 81 mg Oral Tablet, Delayed Release (E.C.) Once a day    azelastine (OPTIVAR) 0.05 % Ophthalmic Drops     Calcium Carbonate (OS-CAL) 650 mg calcium (1,625 mg) Oral Tablet Every evening     Cholecalciferol, Vitamin D3, (VITAMIN D-3) 50 mcg (2,000 unit) Oral Capsule Take by mouth    cholestyramine-sucrose (QUESTRAN) 4 gram Oral Powder in Packet Take 1 Packet by mouth Once a day    citalopram (CELEXA) 10 mg Oral Tablet TAKE 1 TABLET DAILY    enoxaparin (LOVENOX) 100 mg/mL Subcutaneous Syringe 1 mL (100 mg total) by Subcutaneous route Every 12 hours    evolocumab (REPATHA SURECLICK) 671 mg/mL Subcutaneous Pen Injector 140 mg by Subcutaneous route Every 14 days    famotidine (PEPCID) 40 mg Oral Tablet Take 1 Tablet (40 mg  total) by mouth Twice daily    fenofibrate nanocrystallized (TRICOR) 145 mg Oral Tablet TAKE 1 TABLET DAILY    ferrous sulfate (FERATAB) 324 mg (65 mg iron) Oral Tablet, Delayed Release (E.C.) Take 1 Tablet (324 mg total) by mouth Once a day    fluorometholone (FML LIQUIFILM) 0.1 % Ophthalmic Drops, Suspension     KRILL OIL ORAL Take by mouth    LUTEIN ORAL Take by mouth    Magnesium 250 mg Oral Tablet Take 500 mg by mouth Once a day     melatonin 1 mg Oral Tablet As directed    multivitamin (CENTRUM SILVER) 400-250 mcg Oral Tablet, Chewable Take 1 Tab by mouth Once a day Taking different brank with no vit K    nitroGLYCERIN (NITROSTAT) 0.4 mg Sublingual Tablet, Sublingual 1 Tablet (0.4 mg total) by Sublingual route Every 5 minutes as needed for Chest pain for 3 doses over 15 minutes    triamcinolone acetonide (ARISTOCORT A) 0.1 % Ointment APPLY TO AFFECTED AREA TWICE A DAY AS NEEDED    UBIDECARENONE (COQ-10 ORAL) Once a day    warfarin (COUMADIN) 10 mg Oral Tablet Daily or as directed    Warfarin (COUMADIN) 4 mg Oral Tablet Daily or  as directed     Allergies   Allergen Reactions    Gluten      Celiac disease     Past Medical History:   Diagnosis Date    Anemia     Atrial fibrillation (CMS HCC)     BPH associated with nocturia     CAD (coronary artery disease)     Chronic depression 05/23/2018    CKD (chronic kidney disease)     Diet-controlled diabetes mellitus (CMS HCC) 05/23/2018    DVT (deep venous thrombosis) (CMS HCC)     Gastroesophageal reflux disease without esophagitis 05/23/2018    Insomnia 05/23/2018    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    Pericardial effusion     Pulmonary emboli (CMS HCC)          Past Surgical History:   Procedure Laterality Date    HX CHOLECYSTECTOMY      HX CORONARY ARTERY BYPASS GRAFT      HX HEART SURGERY  12/2015    HX HERNIA REPAIR      Inguinal     HX LIPOMA RESECTION      Chest    OTHER SURGICAL HISTORY Left 01/2016    Evacuation of hematoma -Left At[facility]  at [facility]      SKIN CANCER EXCISION  06/21/2013    melanoma/ear/[facility]    TRANSURETHRAL MICROWAVE THERAPY  05/24/2018    Dr Leandro Reasoner         Family Medical History:       Problem Relation (Age of Onset)    Emphysema Father    Heart Attack General Family Hx    Heart Disease Mother    Hypertension (High Blood Pressure) Mother    No Known Problems Other    Stroke Mother              Social History     Socioeconomic History    Marital status: Married     Spouse name: Levada Dy    Number of children: 1    Years of education: Not on file    Highest education level: Not on file   Occupational History    Occupation: Other Land     Comment: Retired   Tobacco Use    Smoking status: Never Smoker    Smokeless tobacco: Never Used   Brewing technologist Use: Never used   Substance and Sexual Activity    Alcohol use: Not Currently     Comment: rare    Drug use: Never    Sexual activity: Yes     Partners: Female     Social Determinants of Company secretary Strain:     Difficulty of Paying Living Expenses:    Food Insecurity:     Worried About Charity fundraiser in the Last Year:     Arboriculturist in the Last Year:    Transportation Needs:     Film/video editor (Medical):     Lack of Transportation (Non-Medical):    Physical Activity:     Days of Exercise per Week:     Minutes of Exercise per Session:    Stress:     Feeling of Stress :    Intimate Partner Violence:     Fear of Current or Ex-Partner:     Emotionally Abused:     Physically Abused:     Sexually Abused:  REVIEW OF SYSTEMS:  Constitutional: No fevers, chills, malaise or abnormal weight loss.    HENT: No tinnitus, vertigo, hearing loss, ear pain, sinus drainage, nasal congestion or throat pain.  Eyes: No vision changes, diplopia, drainage, pain  Respiratory: No cough, shortness of breath, wheezing  Cardiovascular: No chest pain, PND, orthopnea, palpitations, lower extremity edema.    Gastrointestinal: No abdominal pain, nausea, vomiting, diarrhea,  constipation, melana or hematochezia   Endocrine: No polyuria, polydipsia, heat or cold intolerance.  Genitourinary: No dysuria, urgency, frequency, hematuria, nocturia.  Musculoskeletal: No myalgias, arthralgias.  Skin: No rashes, suspicious lesions.  Neurological: No headaches, weakness, paresthesias  Hematological:  No bleeding, spontaneous bruising.  Psychiatric/Behavioral: No confusion, agitation, hallucinations, paranoia, delusions.    All other systems reviewed and are negative except as noted in [place].    PHYSICAL EXAMINATION:     Vitals reviewed. BP 124/78 (Site: Left)    Pulse 65    Resp 16    Ht 1.905 m (6' 3" )    Wt 102 kg (224 lb 9.6 oz)    SpO2 97%    BMI 28.07 kg/m       Vitals Filed         07/23/2020  0940             BP: 124/78    Pulse: 65    Resp: 16            Constitutional: He is oriented to person, place, and time.  He appears well-developed and well-nourished.   HEENT:  Normocephalic, atraumatic.  Nose clear with drainage.  Normal appearing nasal mucosa.  Mucous membranes moist without lesions.   Neck: Supple. Trachea midline.  Cardiovascular: Normal rate and rhythm without murmurs, rubs or gallops.  No JVD.  No bruits.  No peripheral edema is noted.  Pulmonary/Chest: Lungs clear without wheezes, rales or rhonchi.  No chest wall tenderness or deformity.   Abdominal: Soft. Nontender, nondistended. Rectal exam deferred.  Musculoskeletal: Full and painless ROM in all joint groups.  Neurological: Alert and oriented x 4.  BUE's /BLE's.  Strength and sensation intact in all extremeties.   Skin: Skin is warm and dry. No rash or suspicious skin lesions noted.  Psychiatric:  Normal mood and affect. Speech is normal and behavior is normal. Judgment and thought content normal. Cognition and memory are normal.  Vascular: Peripheral lower extremity pulses well felt.    ASSESSMENT:    ENCOUNTER DIAGNOSES     ICD-10-CM   1. Coronary artery disease involving native coronary artery of native heart with  unstable angina pectoris (CMS HCC)  I25.110   2. Hx of CABG  Z95.1   3. Atrial fibrillation, unspecified type (CMS HCC)  I48.91   4. Mixed hyperlipidemia  E78.2   5. Essential hypertension  I10   6. History of DVT of lower extremity  Z86.718   7. Pulmonary embolism, unspecified chronicity, unspecified pulmonary embolism type, unspecified whether acute cor pulmonale present (CMS HCC)  I26.99         PLAN:  Mr. Stocking is a 64 year old white male with history of CAD prior CABG, AFib, history of recurrent DVT and PE, hypertension and hyperlipidemia.    CAD-no recurrent anginal symptoms.  Continue risk factor modification    AFib-continue Coumadin report any signs or symptoms of bleeding to the office.  No reported palpitations    PE/DVT-continue Coumadin and report any signs or symptoms of bleeding to the office    Hypertension-well controlled.  Goal  blood pressure less than 130/80    Hyperlipidemia-intolerant of statin therapy.  Last LDL 106. Patient willing to attempt with coverage from insurance company on [place] for more controlled lipid management.  Will send prescription      Orders Placed This Encounter    evolocumab (REPATHA SURECLICK) 161 mg/mL Subcutaneous Pen Injector    nitroGLYCERIN (NITROSTAT) 0.4 mg Sublingual Tablet, Sublingual       Return in about 6 months (around 01/20/2021) for Bri Wakeman.    Electronically signed by     Cristal Deer, APRN  07/23/2020, 09:58  Note reviewed and I agree with the above findings and assessment and plan by the nurse practitioner.

## 2020-07-24 MED FILL — Repatha Sureclick 140mg/ml Pen: 28 days supply | Qty: 2 | Fill #0 | Status: AC

## 2020-07-24 NOTE — Telephone Encounter (Signed)
Specialty Pharmacy Note    Prior Authorization for medication Repatha has been approved by payor [employer] until 01/19/21. Approval notice has been scanned into Epic and can be found in the Media tab.  [facility] will reach out to schedule medication delivery. Current copay $115.09, foundation assistance is closed.   If you have any questions, don't hesitate to contact the [facility]  07/24/2020, 14:55

## 2020-07-27 ENCOUNTER — Other Ambulatory Visit: Payer: Self-pay

## 2020-07-27 ENCOUNTER — Ambulatory Visit (INDEPENDENT_AMBULATORY_CARE_PROVIDER_SITE_OTHER): Payer: Medicare Other

## 2020-07-27 DIAGNOSIS — I4891 Unspecified atrial fibrillation: Secondary | ICD-10-CM

## 2020-07-27 LAB — POCT INR (RESULTS): POCT INR: 2.8

## 2020-07-28 ENCOUNTER — Other Ambulatory Visit (INDEPENDENT_AMBULATORY_CARE_PROVIDER_SITE_OTHER): Payer: Self-pay | Admitting: Pharmacist

## 2020-07-30 NOTE — Telephone Encounter (Signed)
Specialty Pharmacy Therapy Assessment Note    Assessment Completed: 07/28/2020 (delayed documentation)  Treatment Regimen: Repatha 563 mg Sureclick pen: inject one pen SQ every 14 days  Indication: hyperlipidemia w/ clinical ASCVD  Provider: Cristal Deer, APRN  Patient: Dustin Webster is a 64 y.o. male    Spoke with patient today for [place] education. Medication to be delivered on Friday 9/17 with anticipated start shortly thereafter. Provided education re: medication purpose, dosing, storage, administration, potential side effects, methods for adherence as well as how to manage potential missed doses. Discussed monitoring for common side effects (flu-like symptoms, congestion, runny nose), injection site reactions, allergic type reactions and when to contact pharmacy, inform provider, and seek appropriate care.    Patient plans to take medication every other week on the same weekday and use a calendar to keep track. He has prior injection experience as he has used Lovenox. Providing patient with printed Repatha Sureclick Pen Instructions for Use and needed supplies (alcohol swabs, Band-Aids, sharps container, sharps disposal info).    Reviewed and updated allergies, conditions and medication list with patient. Current Medications:  Allopurinol  Amlodipine  Aspirin low dose  Azelastine eye drops  Calcium  Cholestyramine powder  Citalopram  Co-Q-10  Famotidine  Fenofibrate  Ferrous sulfate  Lutein   Magnesium   Melatonin   Multivitamin   Nitroglycerin prn  Triamcinolone oint prn  Warfarin   Vitamin C  Vitamin D     Baseline Lipid Panel                  Lab Results   Component Value Date    CHOLESTEROL 186 06/11/2020    HDLCHOL 44 06/11/2020    LDLCHOL 106 06/11/2020    LDLCHOLDIR 91 05/07/2018    TRIG 178 (H) 06/11/2020                      Rates current QOL as: "very good". No significant drug-drug interactions expected with Repatha. Reviewed Epic chart for relevant labs necessary for monitoring with Repatha; no  significant issues or contraindications identified.    Patient voiced the following questions: none at this time. Advised patient to contact [facility] at [phone] with future questions or concerns.    Merian Capron, PHARMD  07/30/2020, 17:23

## 2020-08-03 ENCOUNTER — Ambulatory Visit: Payer: Medicare Other | Attending: Family Medicine

## 2020-08-03 ENCOUNTER — Telehealth (INDEPENDENT_AMBULATORY_CARE_PROVIDER_SITE_OTHER): Payer: Self-pay | Admitting: Family Medicine

## 2020-08-03 ENCOUNTER — Encounter (HOSPITAL_BASED_OUTPATIENT_CLINIC_OR_DEPARTMENT_OTHER): Payer: Self-pay | Admitting: Nurse Practitioner

## 2020-08-03 DIAGNOSIS — R05 Cough: Secondary | ICD-10-CM

## 2020-08-03 DIAGNOSIS — R059 Cough, unspecified: Secondary | ICD-10-CM

## 2020-08-03 DIAGNOSIS — U071 COVID-19: Secondary | ICD-10-CM

## 2020-08-03 HISTORY — DX: COVID-19: U07.1

## 2020-08-03 NOTE — Telephone Encounter (Signed)
I called pt and let him know that the order was placed in his chart and he just needed to go have done and I advised him that they closed at 4 pm.  Comments added by RLM, CMA  on 08/03/20 at 15:04.

## 2020-08-03 NOTE — Telephone Encounter (Signed)
Wife is positive for covid. Pt now has temp of 99.4.  Also has nasal congestion, h/a stuffy nose, cough.    Requesting order for testing

## 2020-08-03 NOTE — Telephone Encounter (Signed)
Order placed and if positive would try for infusion quickly

## 2020-08-04 ENCOUNTER — Encounter (INDEPENDENT_AMBULATORY_CARE_PROVIDER_SITE_OTHER): Payer: Self-pay | Admitting: NURSE PRACTITIONER

## 2020-08-04 ENCOUNTER — Telehealth (INDEPENDENT_AMBULATORY_CARE_PROVIDER_SITE_OTHER): Payer: Self-pay | Admitting: Family Medicine

## 2020-08-04 ENCOUNTER — Telehealth (INDEPENDENT_AMBULATORY_CARE_PROVIDER_SITE_OTHER): Payer: Medicare Other | Admitting: NURSE PRACTITIONER

## 2020-08-04 ENCOUNTER — Other Ambulatory Visit: Payer: Self-pay

## 2020-08-04 DIAGNOSIS — U071 COVID-19: Secondary | ICD-10-CM | POA: Insufficient documentation

## 2020-08-04 LAB — COVID-19 ~~LOC~~ MOLECULAR LAB TESTING: SARS-COV-2: DETECTED — AB

## 2020-08-04 NOTE — Telephone Encounter (Signed)
I called pt and notified him of the results and recommendations.  He states that he is doing okay now.  He is agreeable to setting up the infusion.   I advised him that if anything gets worse to seek eval at the ER.  Pt voiced understanding.  Comments added by RLM, CMA  on 08/04/20 at 07:51.

## 2020-08-04 NOTE — Telephone Encounter (Signed)
-----   Message from [name], MD sent at 08/04/2020  6:44 AM EDT -----  Does show positive - can we refer for infusion as he is early in sx and significant comorbid conditions

## 2020-08-04 NOTE — Progress Notes (Addendum)
FAMILY MEDICINE, [facility] [address]  [place] [address]    History and Physical     Name: Dustin Webster MRN:  [id number]   Date: 08/04/2020 Age: 64 y.o.           Telephone Visit    TETSUO COPPOLA  [birth year]  [id number]    PCP: [name], MD    The patient/family initiated a request for telephone service.  Verbal consent for this service was obtained from the patient/family.       Chief Complaint   Patient presents with   . Other     Telephone visit today to discuss covid 19 antibody infusion. Pt tested positive on 08/03/20. Pt states his symptoms started on 07/29/20. Pt states he is taking vitamin C, vitamin D3, and zinc. Pt states he is vaccinated.    . Cough     Pt c/o having a non productive cough and congestion. Pt states he is taking OTC mucinex   . Fever     Pt c/o having a fever.    . Fatigue     Pt c/o fatigue and body weakness   . Sore Throat     Pt c/o having a sore throat today.       Reason for call: Patient was referred for evaluation of Monoclonal Antibody Infusion.     HPI:  Patient reports symptoms consisting of cough, fever, fatigue, sore throat, and congestion that began on 07/29/2020. Patient had a positive COVID test on 08/03/2020. Patient has received COVID vaccines.  Patient has been taking Mucinex, vitamin-C, vitamin D3 and zinc for treatment of symptoms.      PAST MEDICAL HISTORY:   Past Medical History:   Diagnosis Date   . Anemia    . Atrial fibrillation (CMS HCC)    . BPH associated with nocturia    . CAD (coronary artery disease)    . Chronic depression 05/23/2018   . CKD (chronic kidney disease)    . COVID-19 08/03/2020   . Diet-controlled diabetes mellitus (CMS [place]) 05/23/2018   . DVT (deep venous thrombosis) (CMS HCC)    . Gastroesophageal reflux disease without esophagitis 05/23/2018   . Insomnia 05/23/2018   . Malignant melanoma of right ear (CMS [place]) 05/23/2018   . Pericardial effusion    . Pulmonary emboli (CMS HCC)            CURRENT MEDICATIONS:   Current  Outpatient Medications   Medication Sig   . allopurinoL (ZYLOPRIM) 100 mg Oral Tablet TAKE 1 TABLET DAILY   . amLODIPine (NORVASC) 10 mg Oral Tablet Take 1 Tablet (10 mg total) by mouth Once a day   . ascorbic acid, vitamin C, (VITAMIN C) 500 mg Oral Tablet 500 mg Once a day    . aspirin (ECOTRIN) 81 mg Oral Tablet, Delayed Release (E.C.) Once a day   . azelastine (OPTIVAR) 0.05 % Ophthalmic Drops    . Calcium Carbonate (OS-CAL) 650 mg calcium (1,625 mg) Oral Tablet Every evening    . Cholecalciferol, Vitamin D3, (VITAMIN D-3) 50 mcg (2,000 unit) Oral Capsule Take by mouth   . cholestyramine-sucrose (QUESTRAN) 4 gram Oral Powder in Packet Take 1 Packet by mouth Once a day   . citalopram (CELEXA) 10 mg Oral Tablet TAKE 1 TABLET DAILY   . evolocumab (REPATHA SURECLICK) 195 mg/mL Subcutaneous Pen Injector 140 mg by Subcutaneous route Every 14 days (Patient not taking: Reported on 08/04/2020 )   . famotidine (  PEPCID) 40 mg Oral Tablet Take 1 Tablet (40 mg total) by mouth Twice daily   . fenofibrate nanocrystallized (TRICOR) 145 mg Oral Tablet TAKE 1 TABLET DAILY   . ferrous sulfate (FERATAB) 324 mg (65 mg iron) Oral Tablet, Delayed Release (E.C.) Take 1 Tablet (324 mg total) by mouth Once a day   . fluorometholone (FML LIQUIFILM) 0.1 % Ophthalmic Drops, Suspension    . guaiFENesin (MUCINEX) 600 mg Oral Tablet Extended Release 12hr Take 1,200 mg by mouth Twice daily   . KRILL OIL ORAL Take by mouth   . LUTEIN ORAL Take by mouth   . Magnesium 250 mg Oral Tablet Take 500 mg by mouth Once a day    . melatonin 1 mg Oral Tablet As directed   . multivitamin (CENTRUM SILVER) 400-250 mcg Oral Tablet, Chewable Take 1 Tab by mouth Once a day Taking different brank with no vit K   . nitroGLYCERIN (NITROSTAT) 0.4 mg Sublingual Tablet, Sublingual 1 Tablet (0.4 mg total) by Sublingual route Every 5 minutes as needed for Chest pain for 3 doses over 15 minutes   . triamcinolone acetonide (ARISTOCORT A) 0.1 % Ointment APPLY TO AFFECTED  AREA TWICE A DAY AS NEEDED   . UBIDECARENONE (COQ-10 ORAL) Once a day   . warfarin (COUMADIN) 10 mg Oral Tablet Daily or as directed   . Warfarin (COUMADIN) 4 mg Oral Tablet Daily or as directed   . zinc Sulfate (ZINCATE) 50 mg zinc (220 mg) Oral Tablet Take 50 mg by mouth Once a day       ROS:   Review of Systems   Constitutional: Positive for fatigue and fever. Negative for chills and diaphoresis.   HENT: Positive for congestion and sore throat. Negative for rhinorrhea, sinus pressure, sinus pain and sneezing.    Respiratory: Positive for cough. Negative for chest tightness, shortness of breath and wheezing.    Cardiovascular: Negative for chest pain and palpitations.   Gastrointestinal: Negative for abdominal pain, diarrhea, nausea and vomiting.   Musculoskeletal: Negative for arthralgias and myalgias.   Neurological: Positive for weakness. Negative for dizziness, syncope, light-headedness and headaches.         DIAGNOSIS:  Problem List Items Addressed This Visit        Other    COVID-19 - Primary    Relevant Orders    Schedule Covid-19 Infusion - Monoclonal Antibiody - [facility] Location            Casirivimab [phone]) and Cherlyn Roberts [phone]) Investigational use under FDA Emergency Use Authorization (EUA):    I am documenting the following was completed by me prior to administration of investigational casirivimab ([id number]) and imdevimab ([id number]):    The patient and/or caregiver was given the "Fact Sheet for Patients and Parents/Caregivers" on 08/04/2020 15:04 .    The patient and/or caregiver was informed of the alternatives to receiving casirivimab ([id number]) and imdevimab ([id number]).    The patient and/or caregiver was informed that casirivimab ([id number]) and imdevimab ([id number]) are unapproved drugs that is authorized for use under the FDA EUA.    Date of symptom onset:   07/29/2020    Jeffie Pollock, APRN 08/04/2020 15:04    Pt was given the Fact Sheet for Patients, Parents, and Caregivers, pt  signed. Pt was informed of alternatives to receiving authorized bamlanivimab, which is to do nothing and do supportive treatment at home. Pt was informed that [place] is an unapproved drug that is authorized for use under the Emergency  Use Authorization. Pt verbalized understanding.       Patient was advised that there is a [facility] surgeon of the antibody infusion at this time and that he will be placed on a wait list. He will be called as soon as the antibody infusion is available. Patient verbalized understanding.      Patient advised that it is currently recommended they refrain from receiving the COVID 19 vaccination until they are 90 days post infusion, advised patient they can discuss this with her PCP at a later date.    Total provider time spent with the patient on the phone: 11 minutes.    Follow-up with PCP post-infusion or to the ED for symptoms concerning for allergic reaction     Jeffie Pollock, APRN

## 2020-08-04 NOTE — Progress Notes (Signed)
I called pt and notified him of the results and recommendations.  He states that he is doing okay now.  He is agreeable to setting up the infusion.   I advised him that if anything gets worse to seek eval at the ER.  Pt voiced understanding.  Comments added by RLM, CMA  on 08/04/20 at 07:51.

## 2020-08-06 ENCOUNTER — Ambulatory Visit
Admission: RE | Admit: 2020-08-06 | Discharge: 2020-08-06 | Disposition: A | Discharge status: Home / Self Care | Payer: Medicare Other | Source: Ambulatory Visit | Attending: NURSE PRACTITIONER | Admitting: NURSE PRACTITIONER

## 2020-08-06 ENCOUNTER — Other Ambulatory Visit: Payer: Self-pay

## 2020-08-06 VITALS — BP 121/79 | HR 58 | Temp 97.7°F | Resp 14

## 2020-08-06 DIAGNOSIS — U071 COVID-19: Secondary | ICD-10-CM | POA: Insufficient documentation

## 2020-08-06 DIAGNOSIS — Z23 Encounter for immunization: Secondary | ICD-10-CM | POA: Insufficient documentation

## 2020-08-06 MED ORDER — ALBUTEROL SULFATE HFA 90 MCG/ACTUATION AEROSOL INHALER
2.0000 | INHALATION_SPRAY | Freq: Once | RESPIRATORY_TRACT | Status: DC | PRN
Start: 2020-08-06 — End: 2020-08-07

## 2020-08-06 MED ORDER — DIPHENHYDRAMINE 25 MG CAPSULE
50.0000 mg | ORAL_CAPSULE | Freq: Once | ORAL | Status: AC
Start: 2020-08-06 — End: 2020-08-06
  Administered 2020-08-06: 50 mg via ORAL

## 2020-08-06 MED ORDER — ACETAMINOPHEN 325 MG TABLET
650.0000 mg | ORAL_TABLET | Freq: Once | ORAL | Status: AC
Start: 2020-08-06 — End: 2020-08-06
  Administered 2020-08-06: 650 mg via ORAL

## 2020-08-06 MED ORDER — SODIUM CHLORIDE 0.9% FLUSH BAG - 50 ML
INTRAVENOUS | Status: DC | PRN
Start: 2020-08-06 — End: 2020-08-07

## 2020-08-06 MED ORDER — EPINEPHRINE HCL (PF) 1 MG/ML (1 ML) INJECTION SOLUTION
0.3000 mg | Freq: Once | INTRAMUSCULAR | Status: DC | PRN
Start: 2020-08-06 — End: 2020-08-07

## 2020-08-06 MED ORDER — DIPHENHYDRAMINE 50 MG/ML INJECTION SOLUTION
25.0000 mg | Freq: Once | INTRAMUSCULAR | Status: DC | PRN
Start: 2020-08-06 — End: 2020-08-07

## 2020-08-06 MED ORDER — DIPHENHYDRAMINE 50 MG/ML INJECTION SOLUTION
50.0000 mg | Freq: Once | INTRAMUSCULAR | Status: DC | PRN
Start: 2020-08-06 — End: 2020-08-07

## 2020-08-06 MED ORDER — HYDROCORTISONE SOD SUCCINATE 100 MG/2 ML VIAL WRAPPER
100.0000 mg | Freq: Once | INTRAMUSCULAR | Status: DC | PRN
Start: 2020-08-06 — End: 2020-08-07

## 2020-08-06 MED ORDER — DEXTROSE 5% IN WATER (D5W) FLUSH BAG - 50 ML
INTRAVENOUS | Status: DC | PRN
Start: 2020-08-06 — End: 2020-08-07

## 2020-08-06 MED ORDER — SODIUM CHLORIDE 0.9 % INTRAVENOUS SOLUTION
1200.0000 mg | Freq: Once | INTRAVENOUS | Status: AC
Start: 2020-08-06 — End: 2020-08-06
  Administered 2020-08-06: 1200 mg via INTRAVENOUS
  Administered 2020-08-06: 0 mg via INTRAVENOUS
  Filled 2020-08-06: qty 5

## 2020-08-06 MED ORDER — FAMOTIDINE (PF) 20 MG/2 ML INTRAVENOUS SOLUTION
20.0000 mg | Freq: Once | INTRAVENOUS | Status: DC | PRN
Start: 2020-08-06 — End: 2020-08-07

## 2020-08-06 NOTE — Nurses Notes (Signed)
[id number]- Patient arrived to the infusion room at this time. Admission process underway.FDA safety data sheet provided.  Consent obtained.   1407 -Pre-medications administered, IV obtained, tolerated well.   1427- Infusion started, will continue to monitor.   1448- Infusion completed, tolerated well, will monitor closely.   1455- Patient resting quietly   1500 VS obtained, no needs voiced. Will continue to monitor.   1515 IV discontinued  1530 Patient discharged at this time. Instructions reviewed. No questions voiced.

## 2020-08-07 ENCOUNTER — Encounter (INDEPENDENT_AMBULATORY_CARE_PROVIDER_SITE_OTHER): Payer: Self-pay | Admitting: NURSE PRACTITIONER

## 2020-08-17 ENCOUNTER — Ambulatory Visit: Payer: Medicare Other | Attending: Family | Admitting: Family

## 2020-08-17 ENCOUNTER — Other Ambulatory Visit: Payer: Self-pay

## 2020-08-17 ENCOUNTER — Encounter (INDEPENDENT_AMBULATORY_CARE_PROVIDER_SITE_OTHER): Payer: Self-pay | Admitting: Family

## 2020-08-17 ENCOUNTER — Encounter (HOSPITAL_BASED_OUTPATIENT_CLINIC_OR_DEPARTMENT_OTHER): Payer: Self-pay

## 2020-08-17 ENCOUNTER — Ambulatory Visit (INDEPENDENT_AMBULATORY_CARE_PROVIDER_SITE_OTHER): Payer: Medicare Other

## 2020-08-17 VITALS — BP 122/68 | Ht 75.0 in | Wt 220.8 lb

## 2020-08-17 DIAGNOSIS — D509 Iron deficiency anemia, unspecified: Secondary | ICD-10-CM | POA: Insufficient documentation

## 2020-08-17 DIAGNOSIS — I4891 Unspecified atrial fibrillation: Secondary | ICD-10-CM

## 2020-08-17 DIAGNOSIS — K900 Celiac disease: Secondary | ICD-10-CM | POA: Insufficient documentation

## 2020-08-17 DIAGNOSIS — N183 Chronic kidney disease, stage 3 unspecified (CMS HCC): Secondary | ICD-10-CM

## 2020-08-17 DIAGNOSIS — K580 Irritable bowel syndrome with diarrhea: Secondary | ICD-10-CM | POA: Insufficient documentation

## 2020-08-17 LAB — BASIC METABOLIC PANEL
ANION GAP: 9 mmol/L (ref 4–13)
BUN/CREA RATIO: 15 (ref 6–22)
BUN: 22 mg/dL (ref 8–25)
CALCIUM: 9.9 mg/dL (ref 8.8–10.2)
CHLORIDE: 109 mmol/L (ref 96–111)
CO2 TOTAL: 24 mmol/L (ref 23–31)
CREATININE: 1.51 mg/dL — ABNORMAL HIGH (ref 0.75–1.35)
ESTIMATED GFR: 48 mL/min/BSA — ABNORMAL LOW (ref >=60)
GLUCOSE: 92 mg/dL (ref 65–125)
POTASSIUM: 4.4 mmol/L (ref 3.5–5.1)
SODIUM: 142 mmol/L (ref 136–145)

## 2020-08-17 LAB — IRON TRANSFERRIN AND TIBC
IRON (TRANSFERRIN) SATURATION: 17 % (ref 15–50)
IRON: 78 ug/dL (ref 55–175)
TOTAL IRON BINDING CAPACITY: 465 ug/dL (ref 224–476)
TRANSFERRIN: 332 mg/dL (ref 160–340)

## 2020-08-17 LAB — CBC WITH DIFF
BASOPHIL #: <0.1 10*3/uL (ref <=0.20)
BASOPHIL %: 1 %
EOSINOPHIL #: 0.26 10*3/uL (ref <=0.50)
EOSINOPHIL %: 5 %
HCT: 41.4 % (ref 38.9–52.0)
HGB: 13.2 g/dL — ABNORMAL LOW (ref 13.4–17.5)
IMMATURE GRANULOCYTE #: <0.1 10*3/uL (ref <0.10)
IMMATURE GRANULOCYTE %: 1 % (ref 0–1)
LYMPHOCYTE #: 1.49 10*3/uL (ref 1.00–4.80)
LYMPHOCYTE %: 30 %
MCH: 28.5 pg (ref 26.0–32.0)
MCHC: 31.9 g/dL (ref 31.0–35.5)
MCV: 89.4 fL (ref 78.0–100.0)
MONOCYTE #: 0.44 10*3/uL (ref 0.20–1.10)
MONOCYTE %: 9 %
MPV: 12.2 fL (ref 8.7–12.5)
NEUTROPHIL #: 2.62 10*3/uL (ref 1.50–7.70)
NEUTROPHIL %: 54 %
PLATELETS: 232 10*3/uL (ref 150–400)
RBC: 4.63 10*6/uL (ref 4.50–6.10)
RDW-CV: 14.6 % (ref 11.5–15.5)
WBC: 4.9 10*3/uL (ref 3.7–11.0)

## 2020-08-17 LAB — POCT INR (RESULTS): POCT INR: 2.7 (ref 2–3)

## 2020-08-17 LAB — FERRITIN: FERRITIN: 107 ng/mL (ref 20–300)

## 2020-08-17 NOTE — H&P (Signed)
[facility] [place]  [place] [address]  [phone]    Progress Note  Name: Dustin Webster  MRN: [id number]  DOB: 13-Apr-1956  Age: 64 y.o.  Date: 08/17/2020    Reason for Visit: Follow-up (Post procedure 07/17/20. Anemia. Change in bowel habit. Pt states that the diarrhea problem had mainly went away until he got covid about 3 weeks ago and has had it ever since. )    History of Present Illness:  Dustin Webster is a 64 y.o. male who presents today for s/p EGD/Colo.    06/11/2020 H/H 13.9/44.0, Ferritin 71  07/17/2020 EGD/Colo - reflux esophagitis grade B, Schatzki ring (71F dilation), small sliding hiatal hernia, subtle chronic colitis (nonspecific chronic inflammation), moderate diverticulosis of the sigmoid, small internal hemorrhoids  08/27/2019 H/H 13.8/42.6, MCV 87.1, MCH 28.2, ferritin 37  09/2012 EGD/Colo - Grade B esophagitis, duodenum biopsy (flattened villi with chronic inflammation and congestion favoring celiac), normal colonoscopy    States that his diarrhea had gotten better. States that he removed walnuts from diet and symptoms improved. Still had intermittent diarrhea. Had COVID 3 weeks ago and states that this caused him to have diarrhea since. Moving bowels 2 times daily. Loose stool. Still taking cholestyramine 1 packet daily. Denies hematochezia. Endorses dark stools. Is on daily iron supplementation. Last labs in July noted normal Hgb and ferritin level. Denies abdominal pain.    Having heartburn and reflux 1-2 times a week. Taking Pepcid 40mg  BID. States that this has significantly improved his symptoms. Will have symptoms if he eats the wrong food. Denies nausea and vomiting. Has history of celiac disease. Follows a strict gluten free diet. Has lost 25 pounds since March 2021. States that he is trying to lose weight, but does not believe he should have lost this much. Weight has been stable for the last 4 months.    Patient History:  Past Medical  History:   Diagnosis Date   . Anemia    . Atrial fibrillation (CMS HCC)    . BPH associated with nocturia    . CAD (coronary artery disease)    . Chronic depression 05/23/2018   . CKD (chronic kidney disease)    . COVID-19 08/03/2020   . Diet-controlled diabetes mellitus (CMS [facility]) 05/23/2018   . DVT (deep venous thrombosis) (CMS HCC)    . Gastroesophageal reflux disease without esophagitis 05/23/2018   . Insomnia 05/23/2018   . 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 Pericardial effusion    . Pulmonary emboli (CMS [facility])      Past Surgical History:   Procedure Laterality Date   . HX CHOLECYSTECTOMY     . HX CORONARY ARTERY BYPASS GRAFT     . HX HEART SURGERY  12/2015   . HX HERNIA REPAIR      Inguinal    . HX LIPOMA RESECTION      Chest   . OTHER SURGICAL HISTORY Left 01/2016    Evacuation of hematoma -Left Atrium  at [facility]    . SKIN CANCER EXCISION  06/21/2013    melanoma/ear/[facility]   . TRANSURETHRAL MICROWAVE THERAPY  05/24/2018    Dr Leandro Reasoner     Current Outpatient Medications   Medication Sig   . allopurinoL (ZYLOPRIM) 100 mg Oral Tablet TAKE 1 TABLET DAILY   . amLODIPine (NORVASC) 10 mg Oral Tablet Take 1 Tablet (10 mg total) by mouth Once a day   . ascorbic acid,  vitamin C, (VITAMIN C) 500 mg Oral Tablet 500 mg Once a day    . aspirin (ECOTRIN) 81 mg Oral Tablet, Delayed Release (E.C.) Once a day   . azelastine (OPTIVAR) 0.05 % Ophthalmic Drops    . Calcium Carbonate (OS-CAL) 650 mg calcium (1,625 mg) Oral Tablet Every evening    . Cholecalciferol, Vitamin D3, (VITAMIN D-3) 50 mcg (2,000 unit) Oral Capsule Take by mouth   . cholestyramine-sucrose (QUESTRAN) 4 gram Oral Powder in Packet Take 1 Packet by mouth Once a day   . citalopram (CELEXA) 10 mg Oral Tablet TAKE 1 TABLET DAILY   . evolocumab (REPATHA SURECLICK) 496 mg/mL Subcutaneous Pen Injector 140 mg by Subcutaneous route Every 14 days   . famotidine (PEPCID) 40 mg Oral Tablet Take 1 Tablet (40 mg total) by mouth Twice daily   . fenofibrate  nanocrystallized (TRICOR) 145 mg Oral Tablet TAKE 1 TABLET DAILY   . ferrous sulfate (FERATAB) 324 mg (65 mg iron) Oral Tablet, Delayed Release (E.C.) Take 1 Tablet (324 mg total) by mouth Once a day   . fluorometholone (FML LIQUIFILM) 0.1 % Ophthalmic Drops, Suspension    . KRILL OIL ORAL Take by mouth   . LUTEIN ORAL Take by mouth   . Magnesium 250 mg Oral Tablet Take 500 mg by mouth Once a day    . melatonin 1 mg Oral Tablet As directed   . multivitamin (CENTRUM SILVER) 400-250 mcg Oral Tablet, Chewable Take 1 Tab by mouth Once a day Taking different brank with no vit K   . nitroGLYCERIN (NITROSTAT) 0.4 mg Sublingual Tablet, Sublingual 1 Tablet (0.4 mg total) by Sublingual route Every 5 minutes as needed for Chest pain for 3 doses over 15 minutes   . triamcinolone acetonide (ARISTOCORT A) 0.1 % Ointment APPLY TO AFFECTED AREA TWICE A DAY AS NEEDED   . UBIDECARENONE (COQ-10 ORAL) Once a day   . warfarin (COUMADIN) 10 mg Oral Tablet Daily or as directed   . Warfarin (COUMADIN) 4 mg Oral Tablet Daily or as directed     Allergies   Allergen Reactions   . Gluten      Celiac disease     Family Medical History:     Problem Relation (Age of Onset)    Emphysema Father    Heart Attack General Family Hx    Heart Disease Mother    Hypertension (High Blood Pressure) Mother    No Known Problems Other    Stroke Mother        Social History     Socioeconomic History   . Marital status: Married     Spouse name: Levada Dy   . Number of children: 1   . Years of education: Not on file   . Highest education level: Not on file   Occupational History   . Occupation: Other Land     Comment: Retired   Tobacco Use   . Smoking status: Never Smoker   . Smokeless tobacco: Never Used   Vaping Use   . Vaping Use: Never used   Substance and Sexual Activity   . Alcohol use: Not Currently     Comment: rare   . Drug use: Never   . Sexual activity: Yes     Partners: Female   Other Topics Concern   . Not on file   Social History  Narrative   . Not on file     Social Determinants of Health     Financial Resource  Strain:    . Difficulty of Paying Living Expenses:    Food Insecurity:    . Worried About Charity fundraiser in the Last Year:    . Arboriculturist in the Last Year:    Transportation Needs:    . Film/video editor (Medical):    Marland Kitchen Lack of Transportation (Non-Medical):    Physical Activity:    . Days of Exercise per Week:    . Minutes of Exercise per Session:    Stress:    . Feeling of Stress :    Intimate Partner Violence:    . Fear of Current or Ex-Partner:    . Emotionally Abused:    Marland Kitchen Physically Abused:    . Sexually Abused:      Review of Systems:  General Positive for: Weight loss  General Negative for: Fatigue out of ordinary, Appetite Loss     ENT Negative for: Sore throat, Mouth sores, Nose bleeds     Respiratory Negative for: Nocturnal cough, Wheezing, Coughing up blood  Cardiovascular Positive for: Indigestion  Cardiovascular Negative for: Leg edema, Chest Pain  Gastronintestinal Positive for: Change in bowl habits, Black/Tarry stools  Gastronintestinal Negative for: Abdominal pain, Heartburn, Difficulty swallowing, Nausea, Vomiting     Genitourinary Negative for: Pelvic Pain, Vaginal discharge, Urinary frequency  All other review of systems negative    Physical Exam:  BP 122/68   Ht 1.905 m (6\' 3" )   Wt 100 kg (220 lb 12.8 oz)   BMI 27.60 kg/m       Physical Exam  Constitutional:       General: He is not in acute distress.     Appearance: Normal appearance. He is not ill-appearing or diaphoretic.   HENT:      Head: Normocephalic.   Pulmonary:      Effort: Pulmonary effort is normal.   Musculoskeletal:         General: Normal range of motion.   Skin:     General: Skin is dry.   Neurological:      Mental Status: He is alert and oriented to person, place, and time.   Psychiatric:         Behavior: Behavior normal.         Judgment: Judgment normal.       No results were found from the past 30 days.  Results for orders  placed or performed in visit on 08/03/20 (from the past 1008 hour(s))   COVID-19 SCREENING - OUTPATIENT AND DRIVE UP TESTING   Result Value Ref Range    SARS-COV-2 Detected (A) Not Detected    Narrative    INTERPRETATION: POSITIVE, 2019-nCoV/SARS-CoV-2 detected    Positive results are indicative of the presence of SARS-CoV-2 RNA; clinical correlation with patient history and other diagnostic information is necessary to determine patient infection status. Positive results do not rule out bacterial infection or co-infection with other viruses. Laboratories within the [place] are required to report all positive results to the appropriate public health authorities.  Results are for the qualitative identification of SARS-CoV-2 (formerly 2019-nCoV) RNA. The SARS-CoV-2 RNA is generally detectable in nasopharyngeal and oropharyngeal swabs during the ACUTE PHASE of infection. Hence, this test is intended to be performed on respiratory specimens collected from individuals who meet Centers for Disease Control and Prevention (CDC) clinical and/or epidemiological criteria for Coronavirus Disease 2019 (COVID-19) testing. CDC COVID-19 criteria for testing on human specimens are available at [facility] webpage information for Healthcare Professionals:  Coronavirus Disease 2019 (COVID-19) (YogurtCereal.co.uk).     Fact Sheet for Providers: LittleDVDs.dk    Fact Sheet for Patients: SatelliteRebate.it     Disclaimer:   This assay has been authorized by FDA under an Emergency Use Authorization for use in laboratories certified under the Clinical Laboratory Improvement Amendments of 1988 (CLIA), 42 U.S.C. [phone], to perform high complexity tests. The impacts of vaccines, antiviral therapeutics, antibiotics, chemotherapeutic or immunosuppressant drugs have not been evaluated.     Test methodology:   BD MAX SARS-CoV-2 Assay real-time  reverse-transcriptase polymerase chain reaction (RT-PCR) performed on the BD MAX system.   Results for orders placed or performed in visit on 07/27/20 (from the past 1008 hour(s))   POCT INR (RESULTS)   Result Value Ref Range    POCT INR 2.8    Results for orders placed or performed in visit on 07/23/20 (from the past 1008 hour(s))   POCT INR (RESULTS)   Result Value Ref Range    POCT INR 1.6    Results for orders placed or performed in visit on 07/21/20 (from the past 1008 hour(s))   POCT INR (RESULTS)   Result Value Ref Range    POCT INR 1.3    Results for orders placed or performed during the hospital encounter of 07/17/20 (from the past 1008 hour(s))   PT/INR   Result Value Ref Range    PROTHROMBIN TIME 12.9 9.7 - 13.6 seconds    INR 1.07 <=5.00   SURGICAL PATHOLOGY SPECIMEN   Result Value Ref Range    Final Diagnosis       A. GASTROESOPHAGEAL JUNCTION, BIOPSY:   SQUAMOUS AND GASTRIC CARDIAC TYPE MUCOSA WITH MILD REFLUX INFLAMMATORY CHANGES.   NO EVIDENCE OF INTESTINAL METAPLASIA, DYSPLASIA, OR MALIGNANCY.    B. RANDOM COLON BIOPSY   COLONIC MUCOSA WITH MILD NONSPECIFIC CHRONIC INFLAMMATION.   NO EVIDENCE OF LYMPHOCYTIC OR COLLAGENOUS COLITIS.   NO EVIDENCE OF ACUTE CRYPTITIS, CRYPT ABSCESS FORMATION, OR ARCHITECTURAL DISTORTION.  PUPILS        Gross Description       A. GE Junction.  Formalin, Arlyss Queen D., gastroesophageal junction.  The specimen consists of multiple fragments of tan-pink tissue measuring 0.5 x 0.4 x 0.1 cm in aggregate.  Inked and submitted entirely in 1 cassette.    B. Colon.  Formalin, Arlyss Queen D., random colon biopsy.  The specimen consists of multiple fragments of tan-pink tissue measuring 1 x 0.8 x 0.2 cm in aggregate.  Inked and submitted entirely in 1 cassette.     Results for orders placed or performed in visit on 07/15/20 (from the past 1008 hour(s))   COVID-19 SCREENING - PREOP   Result Value Ref Range    SARS-CoV-2 Not Detected Not Detected    Narrative    Results are for  the qualitative identification of SARS-CoV-2 (formerly 2019-nCoV) RNA. The SARS-CoV-2 RNA is generally detectable in nasopharyngeal swabs and nasal wash/aspirates during the ACUTE PHASE of infection. Hence, this test is intended to be performed on respiratory specimens collected from individuals who meet Centers for Disease Control and Prevention (CDC) clinical and/or epidemiological criteria for Coronavirus Disease 2019 (COVID-19) testing. CDC COVID-19 criteria for testing on human specimens are available at [facility] webpage information for Healthcare Professionals: Coronavirus Disease 2019 (COVID-19) (YogurtCereal.co.uk).     Disclaimer:   This assay has been authorized by FDA under an Emergency Use Authorization for use in laboratories certified under the Clinical Laboratory Improvement Amendments of 1988 (CLIA), 42 U.S.C. 970Y,  to perform high complexity tests. The impacts of vaccines, antiviral therapeutics, antibiotics, chemotherapeutic or immunosuppressant drugs have not been evaluated.     Test methodology:  Aptima SARS-CoV-2 assay on the Hologic Panther system       Assessment and Plan:      ICD-10-CM    1. Irritable bowel syndrome with diarrhea  K58.0 CBC/DIFF     TISSUE TRANSGLUTAMINASE (TTG) ANTIBODY, IGA, SERUM     IRON     FERRITIN     IRON TRANSFERRIN AND TIBC   2. Celiac disease  K90.0 CBC/DIFF     TISSUE TRANSGLUTAMINASE (TTG) ANTIBODY, IGA, SERUM     IRON     FERRITIN     IRON TRANSFERRIN AND TIBC   3. Iron deficiency anemia  D50.9 CBC/DIFF     TISSUE TRANSGLUTAMINASE (TTG) ANTIBODY, IGA, SERUM     IRON     FERRITIN     IRON TRANSFERRIN AND TIBC     Patient with history of celiac. Continue to follow gluten free diet. Continue Pepcid 40mg  BID at this time. Discussed change in bowels secondary to COVID. At this time will increase cholestyramine to BID. Patient to notify office of results. Consider a trial of stopping magnesium supplement. Labs ordered. Follow up in  6 months, sooner if needed.    Sheila Oats, FNP-C  08/17/2020, 09:27    This note may have been partially generated using MModal Fluency Direct system, and there may be some incorrect words, spellings, and punctuation that were not noted in checking the note before saving, though effort was made to avoid such errors.

## 2020-08-19 LAB — TISSUE TRANSGLUTAMINASE (TTG) ANTIBODY, IGA, SERUM
TISSUE TRANSGLUTAMINASE ANTIBODIES IGA QUALITATIVE: NEGATIVE
TISSUE TRANSGLUTAMINASE ANTIBODIES IGA QUANTITATIVE: <0.5 U/mL (ref <15.0)

## 2020-08-20 ENCOUNTER — Other Ambulatory Visit (INDEPENDENT_AMBULATORY_CARE_PROVIDER_SITE_OTHER): Payer: Self-pay | Admitting: Pharmacist

## 2020-08-21 NOTE — Telephone Encounter (Signed)
Specialty Pharmacy Therapy Assessment Note    Assessment Completed: 08/20/2020 (delayed documentation)  Treatment Regimen: Repatha 962 mg Sureclick pen: inject one pen SQ every 14 days  Indication: hyperlipidemia w/ clinical ASCVD  Provider: Cristal Deer, APRN  Initiation of Therapy: 08/10/2020  Patient: Dustin Webster is a 64 y.o. male    Spoke with patient today for initial follow-up and to assess safety with Repatha. Patient started Repatha on 08/10/20 and has one pen left for dose due 10/11; using a paper calendar to keep track. Patient denies the following: intolerable side effects, injection site reactions, or trouble completing injections. He felt fatigued the day after first injection, but says he also tested positive for covid and it has since improved. Patient declined review of injection technique/device, storage, side effects, warning/precautions, drug interactions and missed dose handling today. Patient requested supplies (alcohol swabs) with next delivery scheduled for Monday 10/18.    Reminded patient to contact provider about when to obtain FLP. Medication changes since last discussion include: none. No other updates including allergies/conditions per patient. Reviewed Epic chart for relevant labs necessary for monitoring with Repatha; no significant issues or contraindications identified.    Patient voiced the following questions: none at this time. Advised patient to contact [facility] at [phone] with future questions or concerns.    Merian Capron, PHARMD  08/21/2020, 17:51

## 2020-08-24 ENCOUNTER — Encounter (INDEPENDENT_AMBULATORY_CARE_PROVIDER_SITE_OTHER): Payer: Self-pay

## 2020-08-24 NOTE — Progress Notes (Signed)
Pt notified of results by MyChart

## 2020-09-07 ENCOUNTER — Other Ambulatory Visit: Payer: Self-pay

## 2020-09-07 ENCOUNTER — Ambulatory Visit (INDEPENDENT_AMBULATORY_CARE_PROVIDER_SITE_OTHER): Payer: Medicare Other

## 2020-09-07 DIAGNOSIS — I4891 Unspecified atrial fibrillation: Secondary | ICD-10-CM

## 2020-09-07 LAB — POCT INR (RESULTS): POCT INR: 2.5

## 2020-09-16 ENCOUNTER — Other Ambulatory Visit (INDEPENDENT_AMBULATORY_CARE_PROVIDER_SITE_OTHER): Payer: Self-pay | Admitting: Family Medicine

## 2020-09-16 DIAGNOSIS — K219 Gastro-esophageal reflux disease without esophagitis: Secondary | ICD-10-CM

## 2020-09-16 DIAGNOSIS — I10 Essential (primary) hypertension: Secondary | ICD-10-CM

## 2020-09-16 NOTE — Telephone Encounter (Signed)
Last scheduled appointment with you was 06/11/2020.  Currently scheduled future appointment is 12/14/2020.    Confirmed preferred pharmacy for this refill encounter is   [facility], [place]    [place] [address]    Phone: [phone] Fax: [phone]    Hours: Not open 24 hours    [id number], [place]    [address] [place] [address]    Phone: [phone] Fax: [phone]    Hours: Not open 24 hours    [facility], [place], [address] [place] [address]    Phone: [phone] Fax: [phone]    Hours: Not open 24 hours      .     Janora Norlander, MA  09/16/2020, 08:07

## 2020-10-01 MED FILL — Repatha Sureclick 140mg/ml Pen: 28 days supply | Qty: 2 | Fill #2 | Status: AC

## 2020-10-05 ENCOUNTER — Encounter (HOSPITAL_BASED_OUTPATIENT_CLINIC_OR_DEPARTMENT_OTHER): Payer: Self-pay

## 2020-10-07 ENCOUNTER — Other Ambulatory Visit: Payer: Self-pay

## 2020-10-07 ENCOUNTER — Ambulatory Visit (INDEPENDENT_AMBULATORY_CARE_PROVIDER_SITE_OTHER): Payer: Medicare Other

## 2020-10-07 DIAGNOSIS — I2699 Other pulmonary embolism without acute cor pulmonale: Secondary | ICD-10-CM

## 2020-10-07 LAB — POCT INR (RESULTS): POCT INR: 2.3

## 2020-10-26 MED FILL — Repatha Sureclick 140mg/ml Pen: 28 days supply | Qty: 2 | Fill #3 | Status: AC

## 2020-11-03 ENCOUNTER — Ambulatory Visit (INDEPENDENT_AMBULATORY_CARE_PROVIDER_SITE_OTHER): Payer: Medicare Other

## 2020-11-03 ENCOUNTER — Other Ambulatory Visit: Payer: Self-pay

## 2020-11-03 ENCOUNTER — Encounter (INDEPENDENT_AMBULATORY_CARE_PROVIDER_SITE_OTHER): Payer: Self-pay | Admitting: NURSE PRACTITIONER

## 2020-11-03 ENCOUNTER — Other Ambulatory Visit (HOSPITAL_BASED_OUTPATIENT_CLINIC_OR_DEPARTMENT_OTHER): Payer: Self-pay | Admitting: Interventional Cardiology

## 2020-11-03 DIAGNOSIS — I2699 Other pulmonary embolism without acute cor pulmonale: Secondary | ICD-10-CM

## 2020-11-03 LAB — POCT INR (RESULTS): POCT INR: 2.8

## 2020-11-03 MED ORDER — WARFARIN 4 MG TABLET
ORAL_TABLET | ORAL | 3 refills | Status: DC
Start: 2020-11-03 — End: 2021-01-29

## 2020-11-09 ENCOUNTER — Telehealth (INDEPENDENT_AMBULATORY_CARE_PROVIDER_SITE_OTHER): Payer: Self-pay | Admitting: Family Medicine

## 2020-11-09 DIAGNOSIS — M1A9XX0 Chronic gout, unspecified, without tophus (tophi): Secondary | ICD-10-CM

## 2020-11-09 DIAGNOSIS — M79673 Pain in unspecified foot: Secondary | ICD-10-CM

## 2020-11-09 NOTE — Telephone Encounter (Signed)
Ok for referral to podiatry 

## 2020-11-09 NOTE — Telephone Encounter (Signed)
Requesting refill of Allopurinal to [facility].    Also requesting referral to podiatry d/t bone spur. Understands PCP out of office and will not have response this week.  Understands if he would like to see NP to call to schedule.

## 2020-11-10 ENCOUNTER — Encounter (INDEPENDENT_AMBULATORY_CARE_PROVIDER_SITE_OTHER): Payer: Self-pay

## 2020-11-10 MED ORDER — ALLOPURINOL 100 MG TABLET
ORAL_TABLET | ORAL | 1 refills | Status: DC
Start: 2020-11-10 — End: 2021-05-03

## 2020-11-10 NOTE — Telephone Encounter (Signed)
I sent referral to [facility] for the pt.   I updated med list and Rx or Rx's are ready to be sent to   [facility] for the pt.  Comments added by RLM, CMA  on 11/10/20 at 09:05.

## 2020-11-15 ENCOUNTER — Encounter (INDEPENDENT_AMBULATORY_CARE_PROVIDER_SITE_OTHER): Payer: Self-pay | Admitting: Family Medicine

## 2020-11-18 ENCOUNTER — Other Ambulatory Visit (INDEPENDENT_AMBULATORY_CARE_PROVIDER_SITE_OTHER): Payer: Self-pay | Admitting: Family Medicine

## 2020-11-18 DIAGNOSIS — E782 Mixed hyperlipidemia: Secondary | ICD-10-CM

## 2020-11-18 NOTE — Telephone Encounter (Signed)
Last scheduled appointment with you was 06/11/2020, Currently scheduled future appointment is 12/14/2020.    Confirmed preferred pharmacy for this refill encounter is   Preferred Pharmacy     [facility] - [place], [place] - [address]    [address] [place] [address]    Phone: [phone] Fax: [phone]    Hours: Not open 24 hours    [id number] Astr[facility], [facility] - [address] AT [facility] [address]    [address] [place] [address]    Phone: [phone] Fax: [phone]    Hours: Not open 24 hours    [facility] [place], [place] - [address]    [address] [place] [address]    Phone: [phone] Fax: [phone]    Hours: Not open 24 hours      .     Velva Harman, MA  11/18/2020, 09:57

## 2020-11-23 MED FILL — Repatha Sureclick 140mg/ml Pen: 28 days supply | Qty: 2 | Fill #4 | Status: AC

## 2020-12-07 ENCOUNTER — Encounter (INDEPENDENT_AMBULATORY_CARE_PROVIDER_SITE_OTHER): Payer: Self-pay | Admitting: Family Medicine

## 2020-12-07 ENCOUNTER — Other Ambulatory Visit: Payer: Self-pay

## 2020-12-07 ENCOUNTER — Ambulatory Visit (INDEPENDENT_AMBULATORY_CARE_PROVIDER_SITE_OTHER): Payer: Medicare Other

## 2020-12-07 DIAGNOSIS — I2699 Other pulmonary embolism without acute cor pulmonale: Secondary | ICD-10-CM

## 2020-12-07 LAB — POCT INR (RESULTS): POCT INR: 2

## 2020-12-08 ENCOUNTER — Telehealth (INDEPENDENT_AMBULATORY_CARE_PROVIDER_SITE_OTHER): Payer: Self-pay | Admitting: Family Medicine

## 2020-12-08 DIAGNOSIS — I10 Essential (primary) hypertension: Secondary | ICD-10-CM

## 2020-12-08 DIAGNOSIS — K219 Gastro-esophageal reflux disease without esophagitis: Secondary | ICD-10-CM

## 2020-12-08 DIAGNOSIS — D649 Anemia, unspecified: Secondary | ICD-10-CM

## 2020-12-08 DIAGNOSIS — E782 Mixed hyperlipidemia: Secondary | ICD-10-CM

## 2020-12-08 DIAGNOSIS — E119 Type 2 diabetes mellitus without complications: Secondary | ICD-10-CM

## 2020-12-08 DIAGNOSIS — K900 Celiac disease: Secondary | ICD-10-CM

## 2020-12-08 DIAGNOSIS — M1A9XX0 Chronic gout, unspecified, without tophus (tophi): Secondary | ICD-10-CM

## 2020-12-08 DIAGNOSIS — N183 Chronic kidney disease, stage 3 unspecified (CMS HCC): Secondary | ICD-10-CM

## 2020-12-08 NOTE — Telephone Encounter (Signed)
-----   Message from [name], MD sent at 12/08/2020  8:03 AM EST -----  Regarding: RE: blood  Fasting labs with A1c and vit D levels please   ----- Message -----  From: Clint Bolder, MA  Sent: 12/08/2020   7:36 AM EST  To: [name], MD  Subject: FW: blood                                        Just let me know what you would like to order any thing and i will place the orders.        ----- Message -----  From: Willeen Cass  Sent: 12/07/2020   4:16 PM EST  To: Clint Bolder, MA  Subject: blood                                            This message is being sent by Cindi Carbon on behalf of Dustin Webster.    Yes he does.

## 2020-12-08 NOTE — Telephone Encounter (Signed)
Order placed in chart for pt and Message sent back to pt letting them know.   Comments added by RLM, CMA  on 12/08/20 at 09:04.

## 2020-12-10 ENCOUNTER — Other Ambulatory Visit: Payer: Self-pay

## 2020-12-10 ENCOUNTER — Other Ambulatory Visit: Payer: Medicare Other | Attending: Family Medicine | Admitting: Family Medicine

## 2020-12-10 ENCOUNTER — Ambulatory Visit (INDEPENDENT_AMBULATORY_CARE_PROVIDER_SITE_OTHER): Payer: Self-pay | Admitting: Foot & Ankle Surgery

## 2020-12-10 ENCOUNTER — Other Ambulatory Visit (INDEPENDENT_AMBULATORY_CARE_PROVIDER_SITE_OTHER): Payer: Medicare Other

## 2020-12-10 DIAGNOSIS — E119 Type 2 diabetes mellitus without complications: Secondary | ICD-10-CM

## 2020-12-10 DIAGNOSIS — E782 Mixed hyperlipidemia: Secondary | ICD-10-CM

## 2020-12-10 DIAGNOSIS — D649 Anemia, unspecified: Secondary | ICD-10-CM

## 2020-12-10 DIAGNOSIS — I10 Essential (primary) hypertension: Secondary | ICD-10-CM

## 2020-12-10 DIAGNOSIS — K900 Celiac disease: Secondary | ICD-10-CM | POA: Insufficient documentation

## 2020-12-10 DIAGNOSIS — N183 Chronic kidney disease, stage 3 unspecified (CMS HCC): Secondary | ICD-10-CM

## 2020-12-10 DIAGNOSIS — M1A9XX0 Chronic gout, unspecified, without tophus (tophi): Secondary | ICD-10-CM

## 2020-12-10 DIAGNOSIS — K219 Gastro-esophageal reflux disease without esophagitis: Secondary | ICD-10-CM

## 2020-12-10 DIAGNOSIS — M79671 Pain in right foot: Secondary | ICD-10-CM

## 2020-12-10 LAB — CBC WITH DIFF
BASOPHIL #: 0.06 10*3/uL (ref 0.00–0.10)
BASOPHIL %: 1 % (ref 0–1)
EOSINOPHIL #: 0.34 10*3/uL (ref 0.00–0.40)
EOSINOPHIL %: 7 % — ABNORMAL HIGH (ref 0–6)
HCT: 42.9 % (ref 40.7–52.3)
HGB: 13.6 g/dL (ref 12.5–16.9)
LYMPHOCYTE #: 1.76 10*3/uL (ref 1.00–3.70)
LYMPHOCYTE %: 37 % (ref 14–45)
MCH: 27.7 pg (ref 27.0–33.4)
MCHC: 31.7 g/dL (ref 31.0–37.0)
MCV: 87.4 fL (ref 84.1–99.3)
MONOCYTE #: 0.64 10*3/uL (ref 0.00–0.70)
MONOCYTE %: 13 % (ref 0–14)
NEUTROPHIL #: 1.97 10*3/uL (ref 1.50–7.00)
NEUTROPHIL %: 41 % — ABNORMAL LOW (ref 43–80)
PLATELETS: 212 10*3/uL (ref 129–381)
RBC: 4.91 10*6/uL (ref 4.09–5.89)
RDW: 14.9 % (ref 10.8–15.2)
WBC: 4.8 10*3/uL (ref 2.6–9.8)

## 2020-12-10 LAB — COMPREHENSIVE METABOLIC PNL, FASTING
ALBUMIN: 4.6 g/dL (ref 3.5–5.0)
ALKALINE PHOSPHATASE: 31 U/L — ABNORMAL LOW (ref 38–126)
ALT (SGPT): 31 U/L (ref <=50)
ANION GAP: 10 mmol/L
AST (SGOT): 28 U/L (ref 17–59)
BILIRUBIN TOTAL: 0.4 mg/dL (ref 0.2–5.0)
BUN/CREA RATIO: 18
BUN: 27 mg/dL — ABNORMAL HIGH (ref 9–20)
CALCIUM: 9.5 mg/dL (ref 8.4–10.2)
CHLORIDE: 108 mmol/L — ABNORMAL HIGH (ref 98–107)
CO2 TOTAL: 27 mmol/L (ref 22–30)
CREATININE: 1.53 mg/dL — ABNORMAL HIGH (ref 0.66–1.25)
ESTIMATED GFR: 47 mL/min/{1.73_m2} — ABNORMAL LOW (ref >=60)
GLUCOSE: 119 mg/dL — ABNORMAL HIGH (ref 74–106)
POTASSIUM: 4.7 mmol/L (ref 3.5–5.1)
PROTEIN TOTAL: 7.4 g/dL (ref 6.3–8.2)
SODIUM: 145 mmol/L (ref 137–145)

## 2020-12-10 LAB — LIPID PANEL
CHOLESTEROL: 78 mg/dL (ref 0–199)
HDL CHOL: 46 mg/dL (ref 40–60)
LDL CALC: 11 mg/dL (ref 0–130)
TRIGLYCERIDES: 106 mg/dL (ref 0–149)
VLDL CALC: 21 mg/dL

## 2020-12-10 LAB — HGA1C (HEMOGLOBIN A1C WITH EST AVG GLUCOSE)
ESTIMATED AVERAGE GLUCOSE: 131 mg/dL
HEMOGLOBIN A1C: 6.2 % — ABNORMAL HIGH (ref 4.3–6.1)

## 2020-12-10 LAB — THYROID STIMULATING HORMONE (SENSITIVE TSH): TSH: 2.82 u[IU]/mL (ref 0.465–4.680)

## 2020-12-10 LAB — VITAMIN D 25 TOTAL: VITAMIN D 25, TOTAL: 34.7 ng/mL (ref 30.0–100.0)

## 2020-12-10 NOTE — Ancillary Notes (Signed)
Department of Community Practice     Venipuncture performed in office on left  arm antecubital vein, dry pressure dressing was applied to site and patient tolerated it well.  Specimen was centrifuged, aliquoted as needed and specimen was labeled and packaged for transport.    Jerelyn Scott, PHLEBOTOMIST  12/10/2020, 08:10

## 2020-12-14 ENCOUNTER — Ambulatory Visit (INDEPENDENT_AMBULATORY_CARE_PROVIDER_SITE_OTHER): Payer: Medicare Other | Admitting: Family Medicine

## 2020-12-14 ENCOUNTER — Encounter (INDEPENDENT_AMBULATORY_CARE_PROVIDER_SITE_OTHER): Payer: Self-pay | Admitting: Family Medicine

## 2020-12-14 ENCOUNTER — Other Ambulatory Visit: Payer: Self-pay

## 2020-12-14 VITALS — BP 124/80 | HR 65 | Temp 96.7°F | Ht 75.0 in | Wt 231.0 lb

## 2020-12-14 DIAGNOSIS — I25110 Atherosclerotic heart disease of native coronary artery with unstable angina pectoris: Secondary | ICD-10-CM

## 2020-12-14 DIAGNOSIS — Z6828 Body mass index (BMI) 28.0-28.9, adult: Secondary | ICD-10-CM

## 2020-12-14 DIAGNOSIS — I8290 Acute embolism and thrombosis of unspecified vein: Secondary | ICD-10-CM

## 2020-12-14 DIAGNOSIS — D649 Anemia, unspecified: Secondary | ICD-10-CM

## 2020-12-14 DIAGNOSIS — C4321 Malignant melanoma of right ear and external auricular canal: Secondary | ICD-10-CM

## 2020-12-14 DIAGNOSIS — E1122 Type 2 diabetes mellitus with diabetic chronic kidney disease: Secondary | ICD-10-CM

## 2020-12-14 DIAGNOSIS — I129 Hypertensive chronic kidney disease with stage 1 through stage 4 chronic kidney disease, or unspecified chronic kidney disease: Secondary | ICD-10-CM

## 2020-12-14 DIAGNOSIS — N1831 Chronic kidney disease, stage 3a (CMS HCC): Secondary | ICD-10-CM

## 2020-12-14 DIAGNOSIS — K219 Gastro-esophageal reflux disease without esophagitis: Secondary | ICD-10-CM

## 2020-12-14 DIAGNOSIS — I10 Essential (primary) hypertension: Secondary | ICD-10-CM

## 2020-12-14 DIAGNOSIS — K900 Celiac disease: Secondary | ICD-10-CM

## 2020-12-14 DIAGNOSIS — E782 Mixed hyperlipidemia: Secondary | ICD-10-CM

## 2020-12-16 DIAGNOSIS — E1122 Type 2 diabetes mellitus with diabetic chronic kidney disease: Secondary | ICD-10-CM | POA: Insufficient documentation

## 2020-12-16 NOTE — Assessment & Plan Note (Signed)
Continue same medications labs discussed

## 2020-12-16 NOTE — Progress Notes (Signed)
PRIMARY CARE, MID- [facility]  [address] [place]  [address]    Progress Note    Name: Dustin Webster MRN:  [id number]   Date: 12/14/2020 Age: 65 y.o.         Reason for Visit: Follow Up 6 Months (Patient would like to discuss lab results. Right great toe bone spur seeing Podiatry in 2 weeks.)    History of Present Illness  Dustin Webster is a 65 y.o. male who is being seen today for routine FU.  Toe pain above getting better with spacers etc but will be seeign podiatry soon as well.      Problem   Type 2 Diabetes Mellitus With Diabetic Chronic Kidney Disease ([facility])    Lab Results   Component Value Date    HA1C 6.2 (H) 12/10/2020     Continues to do well follows gluten free diet due to celiac but admits has been eatinga little more junk like potatoes etc.  Has regular eye exams - no medications at present - renal function remains stable      Gastroesophageal Reflux Disease Without Esophagitis    Doing well with Pepcid no dyspepsia or bleeding or nausea - off PPI due to renal function      Anemia    Continues  with iron supplements - recent labs reviewed and remains stable no active bleeding      Mixed Hyperlipidemia    Current Medication: repatha  Side Effects of Medication: none  History of Statin Intolerance: yes  Diet: fairly healthy gluten free  Exercise: starting     Lab Results   Component Value Date    CHOLESTEROL 78 12/10/2020    HDLCHOL 46 12/10/2020    LDLCHOL 11 12/10/2020    LDLCHOLDIR 91 05/07/2018    TRIG 106 12/10/2020             Htn (Hypertension)    Blood Pressure Medications: Norvasc 10 mg daily,   Blood Pressure Measurements at home: normal   Concerning Symptoms: no chest pain or dyspnea no edema   Medication Side Effects: none  Diet: fairly healthy  gluten free   Exercise:starting   Specialist Care: cardiology           Cad (Coronary Artery Disease), Native Coronary Artery    History: Positive stress test in [place] followed by Urgent [facility] showing mult-vessel  disease  Assessment: 12/30/2015 s/p CABGx4 LIMA-LAD, SVG-OM, SVG-Ramus, SVG-PDA  Plan:   CAD Core Measures:  Aspirin: Yes  Beta blockers: Yes  Statins: repatha due to intolerance     Interval history:  Continues to do well with medications - recent labs discussed follows with cardiology no chest pain or dyspnea or edema      Celiac Disease    Continues to do well with gluten free diet - no diarrhea or GI upset feels doing well      Vte (Venous Thromboembolism)    H;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 followed by cardiology no recent bleeding complications          Past Medical History:   Diagnosis Date    Anemia     Atrial fibrillation (CMS [facility])     BPH associated with nocturia     CAD (coronary artery disease)     Chronic depression 05/23/2018    CKD (chronic kidney disease)     COVID-19 08/03/2020    Diet-controlled diabetes mellitus (CMS [place]) 05/23/2018  DVT (deep venous thrombosis) (CMS [facility])     Gastroesophageal reflux disease without esophagitis 05/23/2018    Insomnia 05/23/2018    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    Pericardial effusion     Pulmonary emboli (CMS HCC)            Past Surgical History:   Procedure Laterality Date    HX CHOLECYSTECTOMY      HX CORONARY ARTERY BYPASS GRAFT      HX HEART SURGERY  12/2015    HX HERNIA REPAIR      Inguinal     HX LIPOMA RESECTION      Chest    OTHER SURGICAL HISTORY Left 01/2016    Evacuation of hematoma -Left [facility]  at [facility]     SKIN CANCER EXCISION  06/21/2013    melanoma/ear/[facility]    TRANSURETHRAL MICROWAVE THERAPY  05/24/2018    Dr Leandro Reasoner           Medications  allopurinoL (ZYLOPRIM) 100 mg Oral Tablet, TAKE 1 TABLET DAILY Indications: prevention of calcium-containing kidney stones  amLODIPine (NORVASC) 10 mg Oral Tablet, Take 1 Tablet (10 mg total) by mouth Once a day  ascorbic acid, vitamin C, (VITAMIN C) 500 mg Oral Tablet, 500 mg Once a day   aspirin (ECOTRIN) 81 mg Oral Tablet, Delayed Release (E.C.), Once a  day  azelastine (OPTIVAR) 0.05 % Ophthalmic Drops,   Calcium Carbonate (OS-CAL) 650 mg calcium (1,625 mg) Oral Tablet, Every evening   Cholecalciferol, Vitamin D3, (VITAMIN D-3) 50 mcg (2,000 unit) Oral Capsule, Take by mouth  cholestyramine-sucrose (QUESTRAN) 4 gram Oral Powder in Packet, Take 1 Packet by mouth Once a day  citalopram (CELEXA) 10 mg Oral Tablet, Take 1 Tablet (10 mg total) by mouth Once a day TAKE 1 TABLET EVERY DAY  cycloSPORINE (RESTASIS) 0.05 % Ophthalmic Dropperette,   evolocumab (REPATHA SURECLICK) [id number] mg/mL Subcutaneous Pen Injector, 140 mg by Subcutaneous route Every 14 days  famotidine (PEPCID) 40 mg Oral Tablet, Take 1 Tablet (40 mg total) by mouth Twice daily  fenofibrate nanocrystallized (TRICOR) 145 mg Oral Tablet, Take 1 Tablet (145 mg total) by mouth Every morning with breakfast TAKE 1 TABLET EVERY DAY  ferrous sulfate (FERATAB) 324 mg (65 mg iron) Oral Tablet, Delayed Release (E.C.), Take 1 Tablet (324 mg total) by mouth Once a day  fluorometholone (FML LIQUIFILM) 0.1 % Ophthalmic Drops, Suspension,   KRILL OIL ORAL, Take by mouth  LUTEIN ORAL, Take by mouth  Magnesium 250 mg Oral Tablet, Take 500 mg by mouth Once a day   melatonin 1 mg Oral Tablet, As directed  multivitamin (CENTRUM SILVER) 400-250 mcg Oral Tablet, Chewable, Take 1 Tab by mouth Once a day Taking different brank with no vit K  nitroGLYCERIN (NITROSTAT) 0.4 mg Sublingual Tablet, Sublingual, 1 Tablet (0.4 mg total) by Sublingual route Every 5 minutes as needed for Chest pain for 3 doses over 15 minutes  triamcinolone acetonide (ARISTOCORT A) 0.1 % Ointment, APPLY TO AFFECTED AREA TWICE A DAY AS NEEDED  UBIDECARENONE (COQ-10 ORAL), Once a day  warfarin (COUMADIN) 10 mg Oral Tablet, Daily or as directed  Warfarin (COUMADIN) 4 mg Oral Tablet, Daily or as directed    No facility-administered medications prior to visit.      Allergies   Allergen Reactions    Gluten      Celiac disease       Family Medical History:      Problem Relation (Age of Onset)  Emphysema Father    Heart Attack General Family Hx    Heart Disease Mother    Hypertension (High Blood Pressure) Mother    No Known Problems Other    Stroke Mother            Social History     Tobacco Use    Smoking status: Never Smoker    Smokeless tobacco: Never Used   Vaping Use    Vaping Use: Never used   Substance Use Topics    Alcohol use: Not Currently     Comment: rare    Drug use: Never       Review of Systems  Pertinent positives are listed in HPI    Physical Exam:  BP 124/80 (Site: Right, Patient Position: Sitting, Cuff Size: Adult Large)    Pulse 65    Temp 35.9 C (96.7 F) (Thermal Scan)    Ht 1.905 m (6\' 3" )    Wt 105 kg (231 lb)    SpO2 98%    BMI 28.87 kg/m       Physical Exam  Vitals and nursing note reviewed.   Constitutional:       General: He is not in acute distress.     Appearance: Normal appearance.   Neck:      Vascular: No carotid bruit.   Cardiovascular:      Rate and Rhythm: Normal rate and regular rhythm.   Pulmonary:      Effort: Pulmonary effort is normal.      Breath sounds: Normal breath sounds.   Abdominal:      General: Bowel sounds are normal.      Palpations: Abdomen is soft. There is no mass.      Tenderness: There is no abdominal tenderness.   Musculoskeletal:      Cervical back: Neck supple.      Right lower leg: No edema.      Left lower leg: No edema.   Lymphadenopathy:      Cervical: No cervical adenopathy.   Neurological:      Mental Status: He is alert and oriented to person, place, and time.   Psychiatric:         Mood and Affect: Mood normal.                 Assessment/Plan:    Problem List Items Addressed This Visit        Cardiovascular System    Mixed hyperlipidemia     Continue to work with nutrition and monitor labs at next visit today's labs discussed          HTN (hypertension)     Continue same medications labs discussed          CAD (coronary artery disease), native coronary artery     Continue with cardiology and  current management work with increasing exercise          VTE (venous thromboembolism)     Continue same             Nephrology    Stage 3 chronic kidney disease (CMS [place])    Type 2 diabetes mellitus with diabetic chronic kidney disease (CMS [place])     Labs discussed will start increasing exercise monitor diet and repeat labs before next visit             Digestive    Celiac disease     Continue gluten free diet  Gastroesophageal reflux disease without esophagitis     Stable continue same             Hematologic/Lymphatic    Anemia     Continue same labs again before next visit             Ear, Nose, and Throat    Malignant melanoma of right ear (CMS [facility])      Other Visit Diagnoses     BMI 28.0-28.9,adult    -  Primary           No orders of the defined types were placed in this encounter.     There are no discontinued medications.      Follow up: Return in about 6 months (around 06/13/2021), or if symptoms worsen or fail to improve.    Seek medical attention for new or worsening symptoms.    Patient has been seen in this clinic within the last 3 years.     [name], MD     This note was partially created using MModal Fluency Direct system (voice recognition software) and is inherently subject to errors including those of syntax and "sound-alike" substitutions which may escape proofreading.  In such instances, original meaning may be extrapolated by contextual derivation.

## 2020-12-16 NOTE — Assessment & Plan Note (Signed)
Continue gluten free diet.  

## 2020-12-16 NOTE — Assessment & Plan Note (Signed)
Continue same

## 2020-12-16 NOTE — Assessment & Plan Note (Signed)
Stable continue same

## 2020-12-16 NOTE — Assessment & Plan Note (Signed)
Continue with cardiology and current management work with increasing exercise

## 2020-12-16 NOTE — Assessment & Plan Note (Signed)
Continue same labs again before next visit

## 2020-12-16 NOTE — Assessment & Plan Note (Signed)
Labs discussed will start increasing exercise monitor diet and repeat labs before next visit

## 2020-12-16 NOTE — Assessment & Plan Note (Addendum)
Continue to work with nutrition and monitor labs at next visit today's labs discussed

## 2020-12-17 ENCOUNTER — Ambulatory Visit (INDEPENDENT_AMBULATORY_CARE_PROVIDER_SITE_OTHER): Payer: Medicare Other | Admitting: Foot & Ankle Surgery

## 2020-12-17 ENCOUNTER — Encounter (INDEPENDENT_AMBULATORY_CARE_PROVIDER_SITE_OTHER): Payer: Self-pay | Admitting: Foot & Ankle Surgery

## 2020-12-17 ENCOUNTER — Encounter (INDEPENDENT_AMBULATORY_CARE_PROVIDER_SITE_OTHER): Payer: Medicare Other

## 2020-12-17 ENCOUNTER — Other Ambulatory Visit: Payer: Self-pay

## 2020-12-17 VITALS — BP 132/78 | Ht 75.0 in | Wt 231.0 lb

## 2020-12-17 DIAGNOSIS — L853 Xerosis cutis: Secondary | ICD-10-CM

## 2020-12-17 DIAGNOSIS — M79673 Pain in unspecified foot: Secondary | ICD-10-CM

## 2020-12-17 DIAGNOSIS — E119 Type 2 diabetes mellitus without complications: Secondary | ICD-10-CM

## 2020-12-17 DIAGNOSIS — M2011 Hallux valgus (acquired), right foot: Secondary | ICD-10-CM

## 2020-12-17 DIAGNOSIS — M79671 Pain in right foot: Secondary | ICD-10-CM

## 2020-12-17 DIAGNOSIS — Z86718 Personal history of other venous thrombosis and embolism: Secondary | ICD-10-CM

## 2020-12-17 DIAGNOSIS — M2141 Flat foot [pes planus] (acquired), right foot: Secondary | ICD-10-CM

## 2020-12-17 DIAGNOSIS — L84 Corns and callosities: Secondary | ICD-10-CM

## 2020-12-17 DIAGNOSIS — I2510 Atherosclerotic heart disease of native coronary artery without angina pectoris: Secondary | ICD-10-CM

## 2020-12-17 NOTE — Progress Notes (Addendum)
[facility]  [place]  [place] [address]       Name: Dustin Webster MRN:  [id number]   Date: 12/17/2020 Age: 65 y.o.     PCP: [name], MD    Chief Complaint: New Patient (Pt presents for rt foot pain x several months. Pt denies injury. Pt reports 2nd toe callus that is very irritated. Pt reports he feels this is caused by a bone spur in his great.  Pt reports he went to dermatology who took off callus and was told to wear toe separator. Pt is FWB in tennis shoes. )      HPI: Dustin Webster is a 65 y.o. male presenting for right foot pain. The pt states that they have been having pain for the past several months and that he does not remember any injury occurring to the foot. The pt states that they have a callus present to the 2nd digit and it is very irritated. The pt reports that he feels like this may be due to a bone spur in his foot and that he was told by a dermatologist to wear toe spacers after a debridement. He is FWB in tennis shoes and he has no other complaints at this time.     Review of Systems:  Constitutional: No fever, malaise, or weight loss.  Skin: Callus care. No rashes or itching.  HENT: No frequent or significant headaches.   Cardio: No chest pain, palpitations, or leg swelling.   Respiratory: No cough, wheezing, SOB.  GI: No abdominal pain.  GU: No dysuria, hematuria, polyuria.  MSK: Right foot pain. No back pain.  Neuro: No numbness, tingling, or burning in feet.   All other systems reviewed and are negative, unless commented on in the HPI.      Past Medical History:  Past Medical History:   Diagnosis Date    Anemia     Atrial fibrillation (CMS [facility])     BPH associated with nocturia     CAD (coronary artery disease)     Chronic depression 05/23/2018    CKD (chronic kidney disease)     COVID-19 08/03/2020    Diet-controlled diabetes mellitus (CMS [place]) 05/23/2018    DVT (deep venous thrombosis) (CMS [facility])     Gastroesophageal reflux  disease without esophagitis 05/23/2018    Insomnia 05/23/2018    Malignant melanoma of right ear (CMS [place]) 05/23/2018    Pericardial effusion     Pulmonary emboli (CMS [facility])            Past Surgical History:   Past Surgical History:   Procedure Laterality Date    Hx cholecystectomy      Hx coronary artery bypass graft      Hx heart surgery  12/2015    Hx hernia repair      Hx lipoma resection      Other surgical history Left 01/2016    Skin cancer excision  06/21/2013    Transurethral microwave therapy  05/24/2018       Allergies:  Allergies   Allergen Reactions    Gluten      Celiac disease       Medications:  Current Outpatient Medications   Medication Sig    allopurinoL (ZYLOPRIM) 100 mg Oral Tablet TAKE 1 TABLET DAILY Indications: prevention of calcium-containing kidney stones    amLODIPine (NORVASC) 10 mg Oral Tablet Take 1 Tablet (10 mg total) by mouth Once a day  ascorbic acid, vitamin C, (VITAMIN C) 500 mg Oral Tablet 500 mg Once a day     aspirin (ECOTRIN) 81 mg Oral Tablet, Delayed Release (E.C.) Once a day    azelastine (OPTIVAR) 0.05 % Ophthalmic Drops     Calcium Carbonate (OS-CAL) 650 mg calcium (1,625 mg) Oral Tablet Every evening     Cholecalciferol, Vitamin D3, 50 mcg (2,000 unit) Oral Capsule Take by mouth    cholestyramine-sucrose (QUESTRAN) 4 gram Oral Powder in Packet Take 1 Packet by mouth Once a day    citalopram (CELEXA) 10 mg Oral Tablet Take 1 Tablet (10 mg total) by mouth Once a day TAKE 1 TABLET EVERY DAY    cycloSPORINE (RESTASIS) 0.05 % Ophthalmic Dropperette     evolocumab 140 mg/mL Subcutaneous Pen Injector INJECT 1 PEN (140 MG TOTAL) SUBCUTANEOUSLY (UNDER THE SKIN) EVERY 14 DAYS    famotidine (PEPCID) 40 mg Oral Tablet Take 1 Tablet (40 mg total) by mouth Twice daily    fenofibrate nanocrystallized (TRICOR) 145 mg Oral Tablet Take 1 Tablet (145 mg total) by mouth Every morning with breakfast TAKE 1 TABLET EVERY DAY    ferrous sulfate (FERATAB) 324 mg (65 mg  iron) Oral Tablet, Delayed Release (E.C.) Take 1 Tablet (324 mg total) by mouth Once a day    fluorometholone (FML LIQUIFILM) 0.1 % Ophthalmic Drops, Suspension     KRILL OIL ORAL Take by mouth    LUTEIN ORAL Take by mouth    Magnesium 250 mg Oral Tablet Take 500 mg by mouth Once a day     melatonin 1 mg Oral Tablet As directed    multivitamin (CENTRUM SILVER) 400-250 mcg Oral Tablet, Chewable Take 1 Tab by mouth Once a day Taking different brank with no vit K    nitroGLYCERIN (NITROSTAT) 0.4 mg Sublingual Tablet, Sublingual 1 Tablet (0.4 mg total) by Sublingual route Every 5 minutes as needed for Chest pain for 3 doses over 15 minutes    triamcinolone acetonide (ARISTOCORT A) 0.1 % Ointment APPLY TO AFFECTED AREA TWICE A DAY AS NEEDED    UBIDECARENONE (COQ-10 ORAL) Once a day    warfarin (COUMADIN) 10 mg Oral Tablet Daily or as directed    Warfarin (COUMADIN) 4 mg Oral Tablet Daily or as directed       Family History:  Family Medical History:     Problem Relation (Age of Onset)    Emphysema Father    Heart Attack General Family Hx    Heart Disease Mother    Hypertension (High Blood Pressure) Mother    No Known Problems Other    Stroke Mother            Social History:  Social History     Socioeconomic History    Marital status: Married     Spouse name: Levada Dy    Number of children: 1   Occupational History    Occupation: Other Land     Comment: Retired   Tobacco Use    Smoking status: Never Smoker    Smokeless tobacco: Never Used   Brewing technologist Use: Never used   Substance and Sexual Activity    Alcohol use: Not Currently     Comment: rare    Drug use: Never    Sexual activity: Yes     Partners: Female       Physical Exam:  Patient is a 65 y.o. male who appears well developed, well nourished and with good attention  to hygiene and body habitus.   BP 132/78    Ht 1.905 m (6' 3" )    Wt 105 kg (231 lb)    BMI 28.87 kg/m       Body mass index is 28.87 kg/m.  Vascular:    Dorsalis pedis and posterior tibial pulses palpable bilaterally.  Capillary Fill time < 3 seconds to digits 1-5 bilaterally.  Skin temperature warm to warm tibial tuberosity to the digits bilaterally.  Hair growth present.  No edema.   No varicosities.    Neurological:   Intact vibratory sensation at the hallux IPJ bilaterally.  Intact protective sensation b/l via SWMF.    Dermatological:   Skin appears dry.   Good color, texture, turgor.   No open lesions present.     Webspaces clean and dry 1-4 bilaterally.   Nails 1-5 bilaterally appear normal.  Hyperkeratotic lesion noted to the right medial 2nd digit.     Musculoskeletal/Orthopaedic:   Pes planus foot type  Right foot hallux valgus  +5/5 muscle strength Dorsiflexion, Plantarflexion, Inversion, Eversion bilaterally.  ROM of the 1st MTP joint is full bilaterally.    ROM of the MTJ/STJ is full without pain or crepitus bilaterally.    Ankle joint ROM is decreased bilaterally.  Pain on palpation to the medial right 2nd digit.     Encounter Medications and Orders:  Orders Placed This Encounter    XR FOOT WEIGHT BEARING RT (AMB ONLY)    XR FOOT WEIGHT BEARING RT       Imaging:  3 views, AP, OBL, LAT XR taken of the right foot were submitted and interpreted from 12/17/20: Decreased calcaneal inclination angle.  Pes planus foot type with no acute fractures or dislocation    Assessment:    ICD-10-CM    1. Pain in both feet  M79.671     M79.672    2. Pain of foot, unspecified laterality  M79.673 Refer to [facility]) Podiatry, POA   3. Right foot pain  M79.671 XR FOOT WEIGHT BEARING RT     XR FOOT WEIGHT BEARING RT (AMB ONLY)   4. Corn or callus  L84    5. Pes planus of right foot  M21.41    6. Hallux valgus (acquired), right foot  M20.11         Plan:   A comprehensive history and physical examination were preformed. The patient was educated on clinical and radiographic findings, diagnosis and treatment plans. Patient state that he understands all that has been  explained and all questions were answered to his apparent satisfaction.     Discussed applying Aquaphor cream or Vaseline for dry skin.    Toe spacers were dispensed to place in the 1st interspace.    Advised the pt to keep toe spacer between to avoid callus    Supportive shoes was discussed with the patient. Recommended Asics, Brooks, or New Balance type shoes that are stiff and have support.     Follow up as needed.     Injection(s)/Procedure(s):  Callus debridement   # of Hyperkeratotic lesions: 1  Location: right medial 2nd digit.    Utilizing a  #15. Blade, debridement of the right medial 2nd digit hyperkeratotic lesion performed without incident.  This debridement was only of the callus did not extend beyond the callus margins this was a non excisional debridement down to the level of the skin.    Follow-up:  Return if symptoms worsen or fail to improve.  I am scribing for, and in the presence of, Dr. Bartholome Bill for services provided on 12/17/2020.  Sudie Bailey, [place], [place]  12/17/2020, 15:35    I personally performed the services described in this documentation, as scribed  in my presence, and it is both accurate  and complete.    Bartholome Bill, DPM  Bartholome Bill, DPM  12/20/2020, 86:75

## 2020-12-19 ENCOUNTER — Encounter (INDEPENDENT_AMBULATORY_CARE_PROVIDER_SITE_OTHER): Payer: Self-pay | Admitting: NURSE PRACTITIONER

## 2020-12-20 ENCOUNTER — Encounter (INDEPENDENT_AMBULATORY_CARE_PROVIDER_SITE_OTHER): Payer: Self-pay | Admitting: Foot & Ankle Surgery

## 2020-12-20 NOTE — Procedures (Signed)
Imaging:  3 views, AP, OBL, LAT XR taken of the right foot were submitted and interpreted from 12/17/20: Decreased calcaneal inclination angle.  Pes planus foot type with no acute fractures or dislocation

## 2020-12-21 ENCOUNTER — Other Ambulatory Visit: Payer: Self-pay

## 2020-12-21 MED FILL — evolocumab 140 mg/mL subcutaneous pen injector: SUBCUTANEOUS | 28 days supply | Qty: 2 | Fill #0 | Status: AC

## 2020-12-25 ENCOUNTER — Other Ambulatory Visit: Payer: Self-pay

## 2020-12-28 ENCOUNTER — Other Ambulatory Visit: Payer: Self-pay

## 2021-01-04 ENCOUNTER — Ambulatory Visit (INDEPENDENT_AMBULATORY_CARE_PROVIDER_SITE_OTHER): Payer: Medicare Other

## 2021-01-04 ENCOUNTER — Other Ambulatory Visit: Payer: Self-pay

## 2021-01-04 DIAGNOSIS — I2699 Other pulmonary embolism without acute cor pulmonale: Secondary | ICD-10-CM

## 2021-01-19 ENCOUNTER — Other Ambulatory Visit: Payer: Self-pay

## 2021-01-19 MED FILL — evolocumab 140 mg/mL subcutaneous pen injector: SUBCUTANEOUS | 28 days supply | Qty: 2 | Fill #1 | Status: AC

## 2021-01-20 ENCOUNTER — Other Ambulatory Visit: Payer: Self-pay

## 2021-01-21 ENCOUNTER — Other Ambulatory Visit: Payer: Self-pay

## 2021-01-29 ENCOUNTER — Other Ambulatory Visit: Payer: Self-pay

## 2021-01-29 ENCOUNTER — Ambulatory Visit (INDEPENDENT_AMBULATORY_CARE_PROVIDER_SITE_OTHER): Payer: Medicare Other | Admitting: Cardiovascular Disease

## 2021-01-29 ENCOUNTER — Encounter (HOSPITAL_BASED_OUTPATIENT_CLINIC_OR_DEPARTMENT_OTHER): Payer: Self-pay | Admitting: Cardiovascular Disease

## 2021-01-29 VITALS — BP 110/72 | HR 75 | Ht 75.0 in | Wt 234.0 lb

## 2021-01-29 DIAGNOSIS — I2699 Other pulmonary embolism without acute cor pulmonale: Secondary | ICD-10-CM

## 2021-01-29 DIAGNOSIS — I10 Essential (primary) hypertension: Secondary | ICD-10-CM

## 2021-01-29 DIAGNOSIS — Z86718 Personal history of other venous thrombosis and embolism: Secondary | ICD-10-CM

## 2021-01-29 DIAGNOSIS — I2510 Atherosclerotic heart disease of native coronary artery without angina pectoris: Secondary | ICD-10-CM

## 2021-01-29 DIAGNOSIS — I480 Paroxysmal atrial fibrillation: Secondary | ICD-10-CM

## 2021-01-29 DIAGNOSIS — E782 Mixed hyperlipidemia: Secondary | ICD-10-CM

## 2021-01-29 LAB — POCT INR (RESULTS): POCT INR: 2.2

## 2021-01-29 MED ORDER — REPATHA SURECLICK 140 MG/ML SUBCUTANEOUS PEN INJECTOR
140.0000 mg | PEN_INJECTOR | SUBCUTANEOUS | 5 refills | Status: DC
Start: 2021-01-29 — End: 2021-02-12

## 2021-01-29 MED ORDER — WARFARIN 4 MG TABLET
ORAL_TABLET | ORAL | 3 refills | Status: DC
Start: 2021-01-29 — End: 2021-08-03

## 2021-01-29 NOTE — Progress Notes (Signed)
CARDIOLOGY, MEDICAL OFFICE BUILDING D  [place]  [place] [address]  Phone: [phone]  Fax: [phone]    Encounter Date: 01/29/2021    Patient ID:  Dustin Webster  PVV:[id number]    DOB: 08/21/1956  Age: 65 y.o. male    Subjective:     Chief Complaint   Patient presents with    Follow Up 6 Months     HPI   This is a 65 year old male who I follow in the office.  Patient has a history of coronary artery disease and is status post coronary artery bypass grafting.  The patient also has a history of pulmonary embolism and DVT.  He is maintained on warfarin for anticoagulation purposes.  He does deny any bleeding issues with the warfarin.  The patient has had issues with stress testing on 2 occasions when she became very hypotensive.  He did undergo catheterization in 2019 and his grafts were patent.  The patient does complain of some chest discomfort at times.  This is unrelated to exertion and can happen at any time.  He states he has had a lot of fatigue which seems to have been present since he had COVID back in October.  The patient also does have celiac disease as well as diabetes.  The patient also has a history of atrial fibrillation.      Current Outpatient Medications   Medication Sig    allopurinoL (ZYLOPRIM) 100 mg Oral Tablet TAKE 1 TABLET DAILY Indications: prevention of calcium-containing kidney stones    amLODIPine (NORVASC) 10 mg Oral Tablet Take 1 Tablet (10 mg total) by mouth Once a day    ascorbic acid, vitamin C, (VITAMIN C) 500 mg Oral Tablet 500 mg Once a day     aspirin (ECOTRIN) 81 mg Oral Tablet, Delayed Release (E.C.) Once a day    azelastine (OPTIVAR) 0.05 % Ophthalmic Drops     Calcium Carbonate (OS-CAL) 650 mg calcium (1,625 mg) Oral Tablet Every evening     Cholecalciferol, Vitamin D3, 50 mcg (2,000 unit) Oral Capsule Take by mouth    cholestyramine-sucrose (QUESTRAN) 4 gram Oral Powder in Packet Take 1 Packet by mouth Once a day    citalopram (CELEXA) 10 mg  Oral Tablet Take 1 Tablet (10 mg total) by mouth Once a day TAKE 1 TABLET EVERY DAY    Cyanocobalamin-Cobamamide (B12) 5,000-100 mcg Sublingual Lozenge Place under the tongue    cycloSPORINE (RESTASIS) 0.05 % Ophthalmic Dropperette     evolocumab 140 mg/mL Subcutaneous Pen Injector INJECT 1 PEN (140 MG TOTAL) SUBCUTANEOUSLY (UNDER THE SKIN) EVERY 14 DAYS (Patient taking differently: RAPATHA)    famotidine (PEPCID) 40 mg Oral Tablet Take 1 Tablet (40 mg total) by mouth Twice daily    fenofibrate nanocrystallized (TRICOR) 145 mg Oral Tablet Take 1 Tablet (145 mg total) by mouth Every morning with breakfast TAKE 1 TABLET EVERY DAY    ferrous sulfate (FERATAB) 324 mg (65 mg iron) Oral Tablet, Delayed Release (E.C.) Take 1 Tablet (324 mg total) by mouth Once a day    fluorometholone (FML LIQUIFILM) 0.1 % Ophthalmic Drops, Suspension     KRILL OIL ORAL Take by mouth    LUTEIN ORAL Take by mouth    Magnesium 250 mg Oral Tablet Take 500 mg by mouth Once a day     melatonin 1 mg Oral Tablet As directed    multivitamin (CENTRUM SILVER) 400-250 mcg Oral Tablet, Chewable Take 1 Tab by mouth Once a day Taking  different brank with no vit K    nitroGLYCERIN (NITROSTAT) 0.4 mg Sublingual Tablet, Sublingual 1 Tablet (0.4 mg total) by Sublingual route Every 5 minutes as needed for Chest pain for 3 doses over 15 minutes    triamcinolone acetonide (ARISTOCORT A) 0.1 % Ointment APPLY TO AFFECTED AREA TWICE A DAY AS NEEDED    UBIDECARENONE (COQ-10 ORAL) Once a day    warfarin (COUMADIN) 10 mg Oral Tablet Daily or as directed    Warfarin (COUMADIN) 4 mg Oral Tablet Daily or as directed     Allergies   Allergen Reactions    Gluten      Celiac disease     Past Medical History:   Diagnosis Date    Anemia     Atrial fibrillation (CMS HCC)     BPH associated with nocturia     CAD (coronary artery disease)     Chronic depression 05/23/2018    CKD (chronic kidney disease)     COVID-19 08/03/2020    Diet-controlled  diabetes mellitus (CMS [place]) 05/23/2018    DVT (deep venous thrombosis) (CMS HCC)     Gastroesophageal reflux disease without esophagitis 05/23/2018    Insomnia 05/23/2018    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    Pericardial effusion     Pulmonary emboli (CMS HCC)          Past Surgical History:   Procedure Laterality Date    HX CHOLECYSTECTOMY      HX CORONARY ARTERY BYPASS GRAFT      HX HEART SURGERY  12/2015    HX HERNIA REPAIR      Inguinal     HX LIPOMA RESECTION      Chest    OTHER SURGICAL HISTORY Left 01/2016    Evacuation of hematoma -Left Atrium  at [facility]     SKIN CANCER EXCISION  06/21/2013    melanoma/ear/[facility]    TRANSURETHRAL MICROWAVE THERAPY  05/24/2018    Dr Leandro Reasoner         Family Medical History:     Problem Relation (Age of Onset)    Emphysema Father    Heart Attack General Family Hx    Heart Disease Mother    Hypertension (High Blood Pressure) Mother    No Known Problems Other    Stroke Mother          Social History     Tobacco Use    Smoking status: Never Smoker    Smokeless tobacco: Never Used   Vaping Use    Vaping Use: Never used   Substance Use Topics    Alcohol use: Not Currently     Comment: rare    Drug use: Never       Review of Systems   Constitutional: Positive for fatigue. Negative for activity change, appetite change, chills, diaphoresis, fever and unexpected weight change.   HENT: Negative for congestion, dental problem, drooling, ear discharge, ear pain, facial swelling, hearing loss, mouth sores, nosebleeds, postnasal drip, rhinorrhea, sinus pressure, sinus pain, sneezing, sore throat, tinnitus, trouble swallowing and voice change.    Eyes: Negative for photophobia, pain, discharge, redness, itching and visual disturbance.   Respiratory: Positive for chest tightness. Negative for apnea, cough, choking, shortness of breath, wheezing and stridor.    Cardiovascular: Positive for chest pain. Negative for palpitations and leg swelling.   Gastrointestinal:  Negative for abdominal distention, abdominal pain, anal bleeding, blood in stool, constipation, diarrhea, nausea, rectal pain and vomiting.  Endocrine: Negative for cold intolerance, heat intolerance, polydipsia, polyphagia and polyuria.   Genitourinary: Negative for decreased urine volume, difficulty urinating, dysuria, enuresis, flank pain, frequency, genital sores, hematuria, penile discharge, penile pain, penile swelling, scrotal swelling, testicular pain and urgency.   Musculoskeletal: Negative for arthralgias, back pain, gait problem, joint swelling, myalgias, neck pain and neck stiffness.   Skin: Negative.  Negative for color change, pallor, rash and wound.   Allergic/Immunologic: Negative for environmental allergies, food allergies and immunocompromised state.   Neurological: Negative for dizziness, tremors, seizures, syncope, facial asymmetry, speech difficulty, light-headedness, numbness and headaches.   Hematological: Negative for adenopathy. Does not bruise/bleed easily.   Psychiatric/Behavioral: Negative for agitation, behavioral problems, confusion, decreased concentration, dysphoric mood, hallucinations, self-injury, sleep disturbance and suicidal ideas. The patient is not nervous/anxious and is not hyperactive.      Objective:   Vitals: BP 110/72    Pulse 75    Ht 1.905 m (6' 3" )    Wt 106 kg (234 lb)    SpO2 99%    BMI 29.25 kg/m         Physical Exam  Constitutional:       General: He is not in acute distress.     Appearance: He is not diaphoretic.   HENT:      Head: Normocephalic and atraumatic.   Eyes:      Conjunctiva/sclera: Conjunctivae normal.      Pupils: Pupils are equal, round, and reactive to light.   Cardiovascular:      Rate and Rhythm: Normal rate and regular rhythm.      Heart sounds: Normal heart sounds. No murmur heard.    No friction rub. No gallop.   Pulmonary:      Effort: Pulmonary effort is normal. No respiratory distress.      Breath sounds: Normal breath sounds. No wheezing  or rales.   Chest:      Chest wall: No tenderness.   Abdominal:      General: Bowel sounds are normal. There is no distension.      Palpations: Abdomen is soft.      Tenderness: There is no abdominal tenderness. There is no rebound.   Musculoskeletal:         General: Normal range of motion.      Cervical back: Normal range of motion and neck supple.   Skin:     General: Skin is warm and dry.   Neurological:      Mental Status: He is alert and oriented to person, place, and time.   Psychiatric:         Judgment: Judgment normal.         Assessment & Plan:     ENCOUNTER DIAGNOSES     ICD-10-CM   1. CAD (coronary artery disease), native coronary artery  I25.10   2. History of DVT of lower extremity  Z86.718   3. Mixed hyperlipidemia  E78.2   4. HTN (hypertension)  I10   5. Pulmonary emboli (CMS HCC)  I26.99   6. Pulmonary embolism, unspecified chronicity, unspecified pulmonary embolism type, unspecified whether acute cor pulmonale present (CMS HCC)  I26.99   7. Paroxysmal atrial fibrillation (CMS HCC)  I48.0       Orders Placed This Encounter    PRE STRESS ECHO  ADULT    STRESS ECHO - ADULT     1. Coronary artery disease:  The patient is status post coronary artery bypass grafting.  He has not done well  with 2 stress test the past in which she became very hypotensive.  I am going to order a dobutamine stress echocardiogram for further evaluation.  The patient continue with risk factor modification and medical therapy.    2. Hyperlipidemia:  The patient is intolerant of statin therapy either from myalgias or elevated liver function test.  He is on Repatha but does complain about the cost.  I am going to decrease him to once a month.  His LDL is actually very low as well.    3. History of DVT and pulmonary emboli:  The patient continue on warfarin with a goal INR of 2-3.    4. Hypertension:  The patient states his blood pressures been under good control.  He will continue on his current antihypertensive  medications.    5. Atrial fibrillation:  The patient continues in sinus rhythm at this time.  He will continue on warfarin with a goal INR of 2-3.      Return in about 6 months (around 08/01/2021) for np.    Landry Corporal, MD

## 2021-02-10 ENCOUNTER — Other Ambulatory Visit (INDEPENDENT_AMBULATORY_CARE_PROVIDER_SITE_OTHER): Payer: Self-pay | Admitting: Family Medicine

## 2021-02-10 DIAGNOSIS — K219 Gastro-esophageal reflux disease without esophagitis: Secondary | ICD-10-CM

## 2021-02-10 DIAGNOSIS — I10 Essential (primary) hypertension: Secondary | ICD-10-CM

## 2021-02-10 NOTE — Telephone Encounter (Signed)
Last scheduled appointment with you was 12/14/2020, Currently scheduled future appointment is 06/10/2021,     Patient has been seen within the last year: Yes.    Confirmed preferred pharmacy for this refill encounter is   [facility] [place], [place]    [place] [address]    Phone: [phone] Fax: [phone]    Hours: Not open 24 hours    [id number], [place]    [address] [place]  [address]    Phone: [phone] Fax: [phone]    Hours: Not open 24 hours    [place]    [address] [place] [address]    Phone: [phone] Fax: [phone]    Hours: Monday-Friday 8AM-6PM, Saturday & Sunday Closed      .     Neta Mends, MA  02/10/2021, 08:40

## 2021-02-11 ENCOUNTER — Other Ambulatory Visit: Payer: Self-pay

## 2021-02-12 ENCOUNTER — Other Ambulatory Visit: Payer: Self-pay

## 2021-02-12 ENCOUNTER — Encounter (INDEPENDENT_AMBULATORY_CARE_PROVIDER_SITE_OTHER): Payer: Self-pay

## 2021-02-12 ENCOUNTER — Other Ambulatory Visit (HOSPITAL_BASED_OUTPATIENT_CLINIC_OR_DEPARTMENT_OTHER): Payer: Self-pay | Admitting: Cardiovascular Disease

## 2021-02-12 DIAGNOSIS — I2510 Atherosclerotic heart disease of native coronary artery without angina pectoris: Secondary | ICD-10-CM

## 2021-02-12 MED ORDER — REPATHA SURECLICK 140 MG/ML SUBCUTANEOUS PEN INJECTOR
140.0000 mg | PEN_INJECTOR | SUBCUTANEOUS | 5 refills | Status: DC
Start: 2021-02-12 — End: 2023-05-26
  Filled 2021-02-12 – 2021-04-19 (×3): qty 2, 60d supply, fill #0
  Filled 2021-06-16: qty 2, 60d supply, fill #1
  Filled 2021-08-16: qty 2, 60d supply, fill #2
  Filled 2021-10-25: qty 2, 60d supply, fill #3
  Filled 2021-12-27: qty 2, 60d supply, fill #4

## 2021-02-14 ENCOUNTER — Other Ambulatory Visit: Payer: Self-pay

## 2021-02-15 ENCOUNTER — Other Ambulatory Visit: Payer: Self-pay

## 2021-02-18 ENCOUNTER — Other Ambulatory Visit (HOSPITAL_BASED_OUTPATIENT_CLINIC_OR_DEPARTMENT_OTHER): Payer: Self-pay | Admitting: Nurse Practitioner

## 2021-02-19 ENCOUNTER — Other Ambulatory Visit: Payer: Self-pay

## 2021-02-19 ENCOUNTER — Encounter (HOSPITAL_BASED_OUTPATIENT_CLINIC_OR_DEPARTMENT_OTHER): Payer: Self-pay | Admitting: Family

## 2021-02-19 ENCOUNTER — Ambulatory Visit: Payer: Medicare Other | Attending: Family | Admitting: Family

## 2021-02-19 VITALS — BP 110/80 | Ht 75.0 in | Wt 234.3 lb

## 2021-02-19 DIAGNOSIS — K580 Irritable bowel syndrome with diarrhea: Secondary | ICD-10-CM | POA: Insufficient documentation

## 2021-02-19 DIAGNOSIS — K900 Celiac disease: Secondary | ICD-10-CM

## 2021-02-19 DIAGNOSIS — K219 Gastro-esophageal reflux disease without esophagitis: Secondary | ICD-10-CM | POA: Insufficient documentation

## 2021-02-19 DIAGNOSIS — D509 Iron deficiency anemia, unspecified: Secondary | ICD-10-CM

## 2021-02-19 NOTE — H&P (Signed)
GASTROENTEROLOGY, [place]  [place] [address]  [phone]    Progress Note  Name: Dustin Webster  MRN: [id number]  DOB: Mar 26, 1956  Age: 65 y.o.  Date: 02/19/2021    Reason for Visit: Follow up GI Appointment (Irritable bowel syndrome with diarrhea and GERD)      History of Present Illness:  Dustin Webster is a 65 y.o. male who presents today for IBS-D, GERD, celiac.    States that he is having a lot more heartburn and indigestion over the last 2-3 months. Is currently taking Pepcid 40mg  BID. States that he believes this is related to his diet. States that he is drinking a large amount of coffee. Denies nausea and vomiting.    Moving bowels twice daily. Typically formed in AM but then will turn loose. Occasional abdominal "soreness." Will have cramping with diarrhea. Taking cholestyramine. States that he averages 1.5 packets daily. States that if he takes BID he will do better. Also endorses that his diet is worse.    Patient with history of celiac disease. States that he is 100% gluten free. Has history of IDA. Taking daily iron supplementation.   Prior Testing:     12/10/2020   H/H 13.6/42.9    08/17/2020   Ferritin 107   H/H 13.2/41.4   TTG negative   TIBC 465    06/11/2020   H/H 13.9/44.0   Ferritin 71    EGD 07/17/2020   Grade B reflux esophagitis   Schatzki ring - 24F dilation   Small sliding hiatal hernia    Colonoscopy 07/17/2020   Subtle chronic colitis - nonspecific chronic inflammation   Moderate diverticulosis of the sigmoid   Small internal hemorrhoids    08/27/2019   H/H 13.8/42.6   MCV 87.1   MCH 28.2   Ferritin 37    EGD 09/2012   Grade B esophagitis   Duodenum biopsy - flattened villi with chronic inflammation and congestion favoring celiac    Colonoscopy 09/2012   normal    Patient History:  Past Medical History:   Diagnosis Date   . Anemia    . Atrial fibrillation (CMS HCC)    . BPH associated with nocturia    . CAD (coronary artery disease)     . Chronic depression 05/23/2018   . CKD (chronic kidney disease)    . COVID-19 08/03/2020   . Diet-controlled diabetes mellitus (CMS [place]) 05/23/2018   . DVT (deep venous thrombosis) (CMS HCC)    . Gastroesophageal reflux disease without esophagitis 05/23/2018   . Insomnia 05/23/2018   . Malignant melanoma of right ear (CMS [place]) 05/23/2018   . Pericardial effusion    . Pulmonary emboli (CMS [facility])          Past Surgical History:   Procedure Laterality Date   . HX CHOLECYSTECTOMY     . HX CORONARY ARTERY BYPASS GRAFT     . HX HEART SURGERY  12/2015   . HX HERNIA REPAIR      Inguinal    . HX LIPOMA RESECTION      Chest   . OTHER SURGICAL HISTORY Left 01/2016    Evacuation of hematoma -Left Atrium  at [facility]    . SKIN CANCER EXCISION  06/21/2013    melanoma/ear/[facility]   . TRANSURETHRAL MICROWAVE THERAPY  05/24/2018    Dr Leandro Reasoner         Current Outpatient Medications   Medication Sig   . allopurinoL (  ZYLOPRIM) 100 mg Oral Tablet TAKE 1 TABLET DAILY Indications: prevention of calcium-containing kidney stones   . amLODIPine (NORVASC) 10 mg Oral Tablet TAKE 1 TABLET EVERY DAY   . ascorbic acid, vitamin C, (VITAMIN C) 500 mg Oral Tablet 500 mg Once a day    . aspirin (ECOTRIN) 81 mg Oral Tablet, Delayed Release (E.C.) Once a day   . azelastine (OPTIVAR) 0.05 % Ophthalmic Drops    . Calcium Carbonate (OS-CAL) 650 mg calcium (1,625 mg) Oral Tablet Every evening    . Cholecalciferol, Vitamin D3, 50 mcg (2,000 unit) Oral Capsule Take by mouth   . cholestyramine-sucrose (QUESTRAN) 4 gram Oral Powder in Packet Take 1 Packet by mouth Once a day   . citalopram (CELEXA) 10 mg Oral Tablet Take 1 Tablet (10 mg total) by mouth Once a day TAKE 1 TABLET EVERY DAY   . Cyanocobalamin-Cobamamide (B12) 5,000-100 mcg Sublingual Lozenge Place under the tongue   . cycloSPORINE (RESTASIS) 0.05 % Ophthalmic Dropperette    . evolocumab (REPATHA SURECLICK) 315 mg/mL Subcutaneous Pen Injector Inject one pen (140 mg) under the skin Every 30 days   . famotidine  (PEPCID) 40 mg Oral Tablet TAKE 1 TABLET TWICE DAILY   . fenofibrate nanocrystallized (TRICOR) 145 mg Oral Tablet Take 1 Tablet (145 mg total) by mouth Every morning with breakfast TAKE 1 TABLET EVERY DAY   . ferrous sulfate (FERATAB) 324 mg (65 mg iron) Oral Tablet, Delayed Release (E.C.) Take 1 Tablet (324 mg total) by mouth Once a day   . fluorometholone (FML LIQUIFILM) 0.1 % Ophthalmic Drops, Suspension    . KRILL OIL ORAL Take by mouth   . LUTEIN ORAL Take by mouth   . Magnesium 250 mg Oral Tablet Take 500 mg by mouth Once a day    . melatonin 1 mg Oral Tablet As directed   . multivitamin (CENTRUM SILVER) 400-250 mcg Oral Tablet, Chewable Take 1 Tab by mouth Once a day Taking different brank with no vit K   . nitroGLYCERIN (NITROSTAT) 0.4 mg Sublingual Tablet, Sublingual 1 Tablet (0.4 mg total) by Sublingual route Every 5 minutes as needed for Chest pain for 3 doses over 15 minutes   . triamcinolone acetonide (ARISTOCORT A) 0.1 % Ointment APPLY TO AFFECTED AREA TWICE A DAY AS NEEDED   . UBIDECARENONE (COQ-10 ORAL) Once a day   . warfarin (COUMADIN) 10 mg Oral Tablet Daily or as directed   . Warfarin (COUMADIN) 4 mg Oral Tablet Take one tablet by mouth once daily or as directed.     Allergies   Allergen Reactions   . Gluten      Celiac disease     Family Medical History:     Problem Relation (Age of Onset)    Emphysema Father    Heart Attack General Family Hx    Heart Disease Mother    Hypertension (High Blood Pressure) Mother    No Known Problems Other    Stroke Mother          Social History     Socioeconomic History   . Marital status: Married     Spouse name: Dustin Webster   . Number of children: 1   . Years of education: Not on file   . Highest education level: Not on file   Occupational History   . Occupation: Other Land     Comment: Retired   Tobacco Use   . Smoking status: Never Smoker   . Smokeless tobacco: Never  Used   Vaping Use   . Vaping Use: Never used   Substance and Sexual Activity   .  Alcohol use: Not Currently     Comment: rare   . Drug use: Never   . Sexual activity: Yes     Partners: Female   Other Topics Concern   . Not on file   Social History Narrative   . Not on file     Social Determinants of Health     Financial Resource Strain: Not on file   Food Insecurity: Not on file   Transportation Needs: Not on file   Physical Activity: Not on file   Stress: Not on file   Intimate Partner Violence: Not on file   Housing Stability: Not on file     Review of Systems:     General Negative for: Appetite Loss, Weight loss, Fatigue out of ordinary     ENT Negative for: Nose bleeds, Mouth sores, Sore throat     Respiratory Negative for: Coughing up blood, Wheezing, Nocturnal cough  Cardiovascular Positive for: Indigestion  Cardiovascular Negative for: Chest Pain, Leg edema  Gastronintestinal Positive for: Heartburn  Gastronintestinal Negative for: Nausea, Vomiting, Abdominal pain, Black/Tarry stools, Difficulty swallowing, Change in bowl habits     Genitourinary Negative for: Pelvic Pain, Vaginal discharge, Urinary frequency  All other review of systems negative    Physical Exam:  BP 110/80   Ht 1.905 m (6\' 3" )   Wt 106 kg (234 lb 5.6 oz)   BMI 29.29 kg/m       Physical Exam  Constitutional:       General: He is not in acute distress.     Appearance: Normal appearance. He is not ill-appearing or diaphoretic.   HENT:      Head: Normocephalic and atraumatic.   Pulmonary:      Effort: Pulmonary effort is normal.   Musculoskeletal:         General: Normal range of motion.   Skin:     General: Skin is dry.   Neurological:      Mental Status: He is alert and oriented to person, place, and time.   Psychiatric:         Behavior: Behavior normal.         Judgment: Judgment normal.       No results were found from the past 30 days.      Assessment:     2 ICD-10-CM    1. Irritable bowel syndrome with diarrhea  K58.0 MAGNESIUM     TISSUE TRANSGLUTAMINASE (TTG) ANTIBODY, IGA, SERUM     CBC/DIFF     IRON      FERRITIN     IRON TRANSFERRIN AND TIBC   2. Celiac disease  K90.0 MAGNESIUM     TISSUE TRANSGLUTAMINASE (TTG) ANTIBODY, IGA, SERUM     CBC/DIFF     IRON     FERRITIN     IRON TRANSFERRIN AND TIBC   3. Iron deficiency anemia, unspecified iron deficiency anemia type  D50.9 MAGNESIUM     TISSUE TRANSGLUTAMINASE (TTG) ANTIBODY, IGA, SERUM     CBC/DIFF     IRON     FERRITIN     IRON TRANSFERRIN AND TIBC   4. Gastroesophageal reflux disease, unspecified whether esophagitis present  K21.9 MAGNESIUM     TISSUE TRANSGLUTAMINASE (TTG) ANTIBODY, IGA, SERUM     CBC/DIFF     IRON     FERRITIN     IRON  TRANSFERRIN AND TIBC     68M with history of IBS-D and GERD. Moving bowels twice daily but will have occasional diarrhea. If consistent with cholestyramine 1 packet BID, he will have almost complete resolution of symptoms. Having increase in heartburn and reflux. Taking Pepcid 40mg  BID. Endorses poor diet. Was taken off Protonix due to kidney function.    Plan:    1) IBS-D   Cholestyramine 1 packet BID   Stressed importance of medication compliance   Stop magnesium supplement for 2 weeks and then check labs    2) GERD   Continue Pepcid 40mg  BID   Anti-reflux diet   Decrease coffee intake    3) Celiac disease   Check TTG   Gluten free diet    4) IDA   Update iron studies      Dustin Oats, FNP-C  02/19/2021, 10:01    This note may have been partially generated using MModal Fluency Direct system, and there may be some incorrect words, spellings, and punctuation that were not noted in checking the note before saving, though effort was made to avoid such errors.

## 2021-02-22 ENCOUNTER — Encounter (HOSPITAL_BASED_OUTPATIENT_CLINIC_OR_DEPARTMENT_OTHER): Payer: Self-pay

## 2021-02-23 ENCOUNTER — Other Ambulatory Visit: Payer: Self-pay

## 2021-03-01 ENCOUNTER — Other Ambulatory Visit: Payer: Self-pay

## 2021-03-02 ENCOUNTER — Encounter (HOSPITAL_BASED_OUTPATIENT_CLINIC_OR_DEPARTMENT_OTHER): Payer: Self-pay

## 2021-03-02 ENCOUNTER — Other Ambulatory Visit: Payer: Self-pay

## 2021-03-02 ENCOUNTER — Ambulatory Visit (INDEPENDENT_AMBULATORY_CARE_PROVIDER_SITE_OTHER): Payer: Medicare Other

## 2021-03-02 DIAGNOSIS — I480 Paroxysmal atrial fibrillation: Secondary | ICD-10-CM

## 2021-03-02 LAB — POCT INR (RESULTS): POCT INR: 2.2

## 2021-03-09 ENCOUNTER — Ambulatory Visit
Admission: RE | Admit: 2021-03-09 | Discharge: 2021-03-09 | Disposition: A | Discharge status: Home / Self Care | Payer: Medicare Other | Source: Ambulatory Visit | Attending: Cardiovascular Disease | Admitting: Cardiovascular Disease

## 2021-03-09 ENCOUNTER — Other Ambulatory Visit: Payer: Self-pay

## 2021-03-09 ENCOUNTER — Ambulatory Visit (HOSPITAL_BASED_OUTPATIENT_CLINIC_OR_DEPARTMENT_OTHER)
Admission: RE | Admit: 2021-03-09 | Discharge: 2021-03-09 | Disposition: A | Discharge status: Home / Self Care | Payer: Medicare Other | Source: Ambulatory Visit | Attending: Cardiovascular Disease | Admitting: Cardiovascular Disease

## 2021-03-09 DIAGNOSIS — I2510 Atherosclerotic heart disease of native coronary artery without angina pectoris: Secondary | ICD-10-CM

## 2021-03-09 MED ORDER — ATROPINE 0.1 MG/ML INJECTION SYRINGE
INJECTION | INTRAMUSCULAR | Status: AC
Start: 2021-03-09 — End: 2021-03-09
  Filled 2021-03-09: qty 10

## 2021-03-09 MED ORDER — DOBUTAMINE 250 MG/20 ML (12.5 MG/ML) INTRAVENOUS SOLUTION
10.0000 ug/kg/min | Freq: Once | INTRAVENOUS | Status: AC
Start: 2021-03-09 — End: 2021-03-09
  Administered 2021-03-09: 1.06 mg/min via INTRAVENOUS

## 2021-03-09 MED ORDER — DOBUTAMINE 250 MG/20 ML (12.5 MG/ML) INTRAVENOUS SOLUTION
INTRAVENOUS | Status: AC
Start: 2021-03-09 — End: 2021-03-09
  Filled 2021-03-09: qty 20

## 2021-03-09 MED ORDER — SODIUM CHLORIDE 0.9 % INTRAVENOUS SOLUTION
INTRAVENOUS | Status: AC
Start: 2021-03-09 — End: 2021-03-09
  Filled 2021-03-09: qty 250

## 2021-03-09 MED ORDER — PERFLUTREN LIPID MICROSPHERES 1.1 MG/ML INTRAVENOUS SUSPENSION
INTRAVENOUS | Status: AC
Start: 2021-03-09 — End: 2021-03-09
  Filled 2021-03-09: qty 2

## 2021-03-09 MED ORDER — SODIUM CHLORIDE 0.9 % INJECTION SOLUTION
2.0000 mL | Freq: Once | INTRAVENOUS | Status: AC | PRN
Start: 2021-03-09 — End: 2021-03-09
  Administered 2021-03-09: 2 mL via INTRAVENOUS

## 2021-03-10 ENCOUNTER — Ambulatory Visit (HOSPITAL_BASED_OUTPATIENT_CLINIC_OR_DEPARTMENT_OTHER): Payer: Self-pay

## 2021-03-12 ENCOUNTER — Other Ambulatory Visit: Payer: Self-pay

## 2021-03-29 ENCOUNTER — Ambulatory Visit (INDEPENDENT_AMBULATORY_CARE_PROVIDER_SITE_OTHER): Payer: Medicare Other

## 2021-03-29 ENCOUNTER — Other Ambulatory Visit: Payer: Self-pay

## 2021-03-29 ENCOUNTER — Other Ambulatory Visit: Payer: Medicare Other | Attending: Family | Admitting: Family

## 2021-03-29 ENCOUNTER — Other Ambulatory Visit (INDEPENDENT_AMBULATORY_CARE_PROVIDER_SITE_OTHER): Payer: Medicare Other

## 2021-03-29 ENCOUNTER — Telehealth (INDEPENDENT_AMBULATORY_CARE_PROVIDER_SITE_OTHER): Payer: Self-pay | Admitting: Family Medicine

## 2021-03-29 DIAGNOSIS — K219 Gastro-esophageal reflux disease without esophagitis: Secondary | ICD-10-CM

## 2021-03-29 DIAGNOSIS — D509 Iron deficiency anemia, unspecified: Secondary | ICD-10-CM

## 2021-03-29 DIAGNOSIS — I480 Paroxysmal atrial fibrillation: Secondary | ICD-10-CM

## 2021-03-29 DIAGNOSIS — K580 Irritable bowel syndrome with diarrhea: Secondary | ICD-10-CM

## 2021-03-29 DIAGNOSIS — K900 Celiac disease: Secondary | ICD-10-CM

## 2021-03-29 DIAGNOSIS — D649 Anemia, unspecified: Secondary | ICD-10-CM

## 2021-03-29 LAB — POCT INR (RESULTS): POCT INR: 2.8

## 2021-03-29 LAB — MANUAL DIFFERENTIAL
EOSINOPHIL %: 7 % (ref 0–7)
EOSINOPHIL ABSOLUTE: 0.36 10*3/uL (ref 0.00–0.40)
LYMPHOCYTE %: 26 % (ref 25–40)
LYMPHOCYTE ABSOLUTE: 1.33 10*3/uL (ref 1.00–3.70)
MONOCYTE %: 8 % (ref 0–10)
MONOCYTE ABSOLUTE: 0.41 10*3/uL (ref 0.00–0.70)
NEUTROPHIL %: 59 % (ref 37–81)
NEUTROPHIL ABSOLUTE: 3.01 10*3/uL (ref 1.50–7.00)
PLATELET MORPHOLOGY COMMENT: NORMAL
WBC: 5.1 10*3/uL

## 2021-03-29 LAB — MAGNESIUM: MAGNESIUM: 2.1 mg/dL (ref 1.9–2.3)

## 2021-03-29 LAB — IRON TRANSFERRIN AND TIBC
IRON (TRANSFERRIN) SATURATION: 11 % — ABNORMAL LOW (ref 20–55)
IRON: 56 ug/dL (ref 49–181)
TOTAL IRON BINDING CAPACITY: 499 ug/dL — ABNORMAL HIGH (ref 261–462)

## 2021-03-29 LAB — CBC WITH DIFF
HCT: 28.3 % — ABNORMAL LOW (ref 40.7–52.3)
HGB: 8.5 g/dL — ABNORMAL LOW (ref 12.5–16.9)
MCH: 27.7 pg (ref 27.0–33.4)
MCHC: 30 g/dL — ABNORMAL LOW (ref 31.0–37.0)
MCV: 92.2 fL (ref 84.1–99.3)
PLATELETS: 237 10*3/uL (ref 129–381)
RBC: 3.07 10*6/uL — ABNORMAL LOW (ref 4.09–5.89)
RDW: 16.5 % — ABNORMAL HIGH (ref 10.8–15.2)
WBC: 5.1 10*3/uL (ref 2.6–9.8)

## 2021-03-29 LAB — FERRITIN: FERRITIN: 32 ng/mL (ref 18–464)

## 2021-03-29 NOTE — Ancillary Notes (Signed)
Department of Community Practice     Venipuncture performed in office on left  arm antecubital vein, dry pressure dressing was applied to site and patient tolerated it well.  Specimen was centrifuged, aliquoted as needed and specimen was labeled and packaged for transport.    Leeann Must  03/29/2021, 10:44

## 2021-03-29 NOTE — Telephone Encounter (Signed)
Pt had labs done today for Dustin Webster. Pt called in notified me he appears anemic again and questions if provider will review as well and consider increasing iron and if further testing is needed.

## 2021-03-30 ENCOUNTER — Encounter (INDEPENDENT_AMBULATORY_CARE_PROVIDER_SITE_OTHER): Payer: Self-pay | Admitting: Family Medicine

## 2021-03-30 NOTE — Telephone Encounter (Signed)
LM for patient with recommendations. If patient wants referred to hematologist ask that patient lets [place] know.

## 2021-03-30 NOTE — Addendum Note (Signed)
Addended by: Clydie Braun on: 03/30/2021 02:21 PM     Modules accepted: Orders

## 2021-03-30 NOTE — Telephone Encounter (Signed)
Follow-up from [place] phone message.  I am still taking one iron tablet a day. 65 mg.  I will increase to two.   Per that message please set up an appointment with a hematologist.    THANKS.  Hope you are having a wonderful spring.

## 2021-03-30 NOTE — Telephone Encounter (Signed)
Make sure taking iron with vit C and would increase to BID _ await rest of labs may need to consider hematology referral as well

## 2021-03-31 ENCOUNTER — Other Ambulatory Visit: Payer: Self-pay

## 2021-03-31 LAB — TISSUE TRANSGLUTAMINASE (TTG) ANTIBODY, IGA, SERUM
TISSUE TRANSGLUTAMINASE ANTIBODIES IGA QUALITATIVE: NEGATIVE
TISSUE TRANSGLUTAMINASE ANTIBODIES IGA QUANTITATIVE: <0.5 U/mL (ref <15.0)

## 2021-04-08 ENCOUNTER — Other Ambulatory Visit (HOSPITAL_BASED_OUTPATIENT_CLINIC_OR_DEPARTMENT_OTHER): Payer: Self-pay | Admitting: Family

## 2021-04-08 DIAGNOSIS — K219 Gastro-esophageal reflux disease without esophagitis: Secondary | ICD-10-CM

## 2021-04-14 ENCOUNTER — Other Ambulatory Visit (INDEPENDENT_AMBULATORY_CARE_PROVIDER_SITE_OTHER): Payer: Self-pay | Admitting: Family Medicine

## 2021-04-14 DIAGNOSIS — E782 Mixed hyperlipidemia: Secondary | ICD-10-CM

## 2021-04-14 NOTE — Telephone Encounter (Signed)
Last scheduled appointment with you was 12/14/2020, Currently scheduled future appointment is 06/10/2021,     Patient has been seen within the last year: Yes.    Confirmed preferred pharmacy for this refill encounter is   [facility] [place], [place]    [place] [address]    Phone: [phone] Fax: [phone]    Hours: Not open 24 hours    [id number], [place]    [address] [place] [address]    Phone: [phone] Fax: [phone]    Hours: Not open 24 hours    [place]    [address] [place] [address]    Phone: [phone] Fax: [phone]    Hours: Monday-Friday 8AM-6PM, Saturday & Sunday Closed      .     Janora Norlander, MA  04/14/2021, 09:59

## 2021-04-15 ENCOUNTER — Encounter (HOSPITAL_BASED_OUTPATIENT_CLINIC_OR_DEPARTMENT_OTHER): Payer: Self-pay | Admitting: UROLOGY

## 2021-04-19 ENCOUNTER — Other Ambulatory Visit: Payer: Self-pay

## 2021-04-20 ENCOUNTER — Other Ambulatory Visit: Payer: Self-pay

## 2021-04-21 ENCOUNTER — Other Ambulatory Visit: Payer: Self-pay

## 2021-04-21 ENCOUNTER — Ambulatory Visit
Admission: RE | Admit: 2021-04-21 | Discharge: 2021-04-21 | Disposition: A | Discharge status: Home / Self Care | Payer: Medicare Other | Source: Ambulatory Visit | Attending: Family | Admitting: Family

## 2021-04-21 ENCOUNTER — Encounter (HOSPITAL_BASED_OUTPATIENT_CLINIC_OR_DEPARTMENT_OTHER): Payer: Self-pay | Admitting: Family

## 2021-04-21 ENCOUNTER — Ambulatory Visit (HOSPITAL_BASED_OUTPATIENT_CLINIC_OR_DEPARTMENT_OTHER): Payer: Medicare Other | Admitting: Family

## 2021-04-21 DIAGNOSIS — D649 Anemia, unspecified: Secondary | ICD-10-CM | POA: Insufficient documentation

## 2021-04-21 LAB — COMPREHENSIVE METABOLIC PANEL, NON-FASTING
ALBUMIN: 4.1 g/dL (ref 3.4–4.8)
ALKALINE PHOSPHATASE: 38 U/L — ABNORMAL LOW (ref 45–115)
ALT (SGPT): 26 U/L (ref 10–55)
ANION GAP: 5 mmol/L (ref 4–13)
AST (SGOT): 27 U/L (ref 8–45)
BILIRUBIN TOTAL: 0.2 mg/dL — ABNORMAL LOW (ref 0.3–1.3)
BUN/CREA RATIO: 16 (ref 6–22)
BUN: 26 mg/dL — ABNORMAL HIGH (ref 8–25)
CALCIUM: 9.6 mg/dL (ref 8.8–10.2)
CHLORIDE: 112 mmol/L — ABNORMAL HIGH (ref 96–111)
CO2 TOTAL: 23 mmol/L (ref 23–31)
CREATININE: 1.6 mg/dL — ABNORMAL HIGH (ref 0.75–1.35)
ESTIMATED GFR: 47 mL/min/BSA — ABNORMAL LOW (ref >=60)
GLUCOSE: 173 mg/dL — ABNORMAL HIGH (ref 65–125)
POTASSIUM: 4.5 mmol/L (ref 3.5–5.1)
PROTEIN TOTAL: 6.9 g/dL (ref 6.0–8.0)
SODIUM: 140 mmol/L (ref 136–145)

## 2021-04-21 LAB — CBC WITH DIFF
BASOPHIL #: <0.1 10*3/uL (ref <=0.20)
BASOPHIL %: 1 %
EOSINOPHIL #: 0.36 10*3/uL (ref <=0.50)
EOSINOPHIL %: 8 %
HCT: 32.7 % — ABNORMAL LOW (ref 38.9–52.0)
HGB: 10.2 g/dL — ABNORMAL LOW (ref 13.4–17.5)
IMMATURE GRANULOCYTE #: <0.1 10*3/uL (ref <0.10)
IMMATURE GRANULOCYTE %: 1 % (ref 0–1)
LYMPHOCYTE #: 1.46 10*3/uL (ref 1.00–4.80)
LYMPHOCYTE %: 32 %
MCH: 30.3 pg (ref 26.0–32.0)
MCHC: 31.2 g/dL (ref 31.0–35.5)
MCV: 97 fL (ref 78.0–100.0)
MONOCYTE #: 0.39 10*3/uL (ref 0.20–1.10)
MONOCYTE %: 8 %
MPV: 10.8 fL (ref 8.7–12.5)
NEUTROPHIL #: 2.34 10*3/uL (ref 1.50–7.70)
NEUTROPHIL %: 50 %
PLATELETS: 206 10*3/uL (ref 150–400)
RBC: 3.37 10*6/uL — ABNORMAL LOW (ref 4.50–6.10)
RDW-CV: 15.8 % — ABNORMAL HIGH (ref 11.5–15.5)
WBC: 4.6 10*3/uL (ref 3.7–11.0)

## 2021-04-21 LAB — IRON TRANSFERRIN AND TIBC
IRON (TRANSFERRIN) SATURATION: 18 % (ref 15–50)
IRON: 87 ug/dL (ref 55–175)
TOTAL IRON BINDING CAPACITY: 489 ug/dL — ABNORMAL HIGH (ref 224–476)
TRANSFERRIN: 349 mg/dL — ABNORMAL HIGH (ref 160–340)

## 2021-04-21 LAB — VITAMIN B12: VITAMIN B 12: 1533 pg/mL — ABNORMAL HIGH (ref 200–900)

## 2021-04-21 LAB — RETICULOCYTE COUNT
IMMATURE RETIC FRACTION: 22.1 % — ABNORMAL HIGH (ref 2.7–15.9)
RETICULOCYTE % AUTOMATED: 3.98 % — ABNORMAL HIGH (ref 0.50–2.20)
RETICULOCYTE HEMOGLOBIN EQUIVALENT: 33.9 pg (ref 28.0–38.0)
RETICULOCYTES COUNT # AUTOMATED: 134.1 10*3/uL — ABNORMAL HIGH (ref 26.0–110.0)

## 2021-04-21 LAB — FERRITIN: FERRITIN: 35 ng/mL (ref 20–300)

## 2021-04-21 LAB — FOLATE: FOLATE: 9.5 ng/mL (ref 7.0–31.0)

## 2021-04-22 ENCOUNTER — Other Ambulatory Visit: Payer: Self-pay

## 2021-04-22 LAB — ALBUMIN FOR ELECTROPHORESIS: ALBUMIN: 4.3 g/dL (ref 3.4–4.8)

## 2021-04-22 LAB — PROTEIN FOR ELECTROPHORESIS: PROTEIN TOTAL: 6.7 g/dL (ref 5.6–7.6)

## 2021-04-23 ENCOUNTER — Ambulatory Visit
Admission: RE | Admit: 2021-04-23 | Discharge: 2021-04-23 | Disposition: A | Discharge status: Home / Self Care | Payer: Medicare Other | Source: Ambulatory Visit | Attending: Family | Admitting: Family

## 2021-04-23 ENCOUNTER — Other Ambulatory Visit: Payer: Self-pay

## 2021-04-23 DIAGNOSIS — D649 Anemia, unspecified: Secondary | ICD-10-CM | POA: Insufficient documentation

## 2021-04-23 LAB — PROTEIN ELECTROPHORESIS, SERUM (SPEP)
ALBUMIN: 4.3 g/dL (ref 3.4–4.8)
PATHOLOGIST INTERPRETATION SPEP: NORMAL
PROTEIN TOTAL: 6.7 g/dL (ref 5.6–7.6)

## 2021-04-23 LAB — FECAL OCCULT BLD (TRIPLE CARD)
OCCULT BLOOD: NEGATIVE
OCCULT BLOOD: NEGATIVE
OCCULT BLOOD: NEGATIVE

## 2021-04-27 NOTE — Cancer Center Note (Signed)
[facility]  [address]   [place], [place] [address]   [phone]       Name:Dustin Webster  Date of Birth: 05/28/1956  MRN: [id number]  Date of Service:  04/21/2021       Dustin Webster is a 65 y.o. who presents to the [facility] at [facility] for evaluation and opinion regarding anemia The referring physician [name], MD  [facility]  [address] [place],  [place] [address] and I will discuss my recommendations and findings by way of shared medical records, telephone converation or a letter via [place] mail.       Hematology/Oncology History and HPI    Patient was referred by PCP for evaluation of anemia.   Patient states he has been diagnosed with anemia over 15 years ago and has been taking oral iron since initial diagnosis, he has not required iron infusions.  He states recently he developed shortness of breath, fatigue and chest pain.  Patient reports history of AFib and underwent CABG 4 years ago.  Patient is on Ecotrin and Coumadin for management of atrial fib which is managed by Cardiology.  Patient follows closely with Gastroenterology for medical management of celiac disease, last EGD 07/21/2020 per Dr. Stanford Scotland.    Recent lab workup reviewed:  -Records from 09/19/2016 through 03/29/2021 shows hemoglobin ranging from 15.9 to most recently 8.5 on 03/29/2021.  -03/29/2021:  Ferritin 32, iron saturation 11 TIBC 499.     Patient denies any recent GI blood loss, melena or hematochezia.  He denies any family history of anemia.        Past Medical History    Past Medical History:   Diagnosis Date   . Anemia    . Atrial fibrillation (CMS HCC)    . BPH associated with nocturia    . CAD (coronary artery disease)    . Chronic depression 05/23/2018   . CKD (chronic kidney disease)    . COVID-19 08/03/2020   . Diet-controlled diabetes mellitus (CMS [place]) 05/23/2018   . DVT (deep venous thrombosis) (CMS HCC)    . Gastroesophageal reflux disease without esophagitis  05/23/2018   . Insomnia 05/23/2018   . 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 Pericardial effusion    . Pulmonary emboli (CMS [facility])            Past Surgical History:   Procedure Laterality Date   . HX CHOLECYSTECTOMY     . HX CORONARY ARTERY BYPASS GRAFT     . HX HEART SURGERY  12/2015   . HX HERNIA REPAIR      Inguinal    . HX LIPOMA RESECTION      Chest   . OTHER SURGICAL HISTORY Left 01/2016    Evacuation of hematoma -Left Atrium  at [facility]    . SKIN CANCER EXCISION  06/21/2013    melanoma/ear/[facility]   . TRANSURETHRAL MICROWAVE THERAPY  05/24/2018    Dr Leandro Reasoner           Current Outpatient Medications   Medication Sig Dispense Refill   . allopurinoL (ZYLOPRIM) 100 mg Oral Tablet TAKE 1 TABLET DAILY Indications: prevention of calcium-containing kidney stones 90 Tablet 1   . amLODIPine (NORVASC) 10 mg Oral Tablet TAKE 1 TABLET EVERY DAY 90 Tablet 1   . ascorbic acid, vitamin C, (VITAMIN C) 500 mg Oral Tablet 500 mg Once a day      .  aspirin (ECOTRIN) 81 mg Oral Tablet, Delayed Release (E.C.) Once a day     . azelastine (OPTIVAR) 0.05 % Ophthalmic Drops      . Calcium Carbonate (OS-CAL) 650 mg calcium (1,625 mg) Oral Tablet Every evening      . Cholecalciferol, Vitamin D3, 50 mcg (2,000 unit) Oral Capsule Take by mouth     . cholestyramine-sucrose (QUESTRAN) 4 gram Oral Powder in Packet Take 1 Packet by mouth Once a day 90 Packet 1   . citalopram (CELEXA) 10 mg Oral Tablet Take 1 Tablet (10 mg total) by mouth Once a day 90 Tablet 1   . Cyanocobalamin-Cobamamide (B12) 5,000-100 mcg Sublingual Lozenge Place under the tongue     . cycloSPORINE (RESTASIS) 0.05 % Ophthalmic Dropperette      . evolocumab (REPATHA SURECLICK) 161 mg/mL Subcutaneous Pen Injector Inject one pen (140 mg) under the skin Every 30 days 4 mL 5   . famotidine (PEPCID) 40 mg Oral Tablet TAKE 1 TABLET TWICE DAILY 180 Tablet 1   . fenofibrate nanocrystallized (TRICOR) 145 mg Oral Tablet Take 1 Tablet (145 mg total) by mouth Every morning with  breakfast 90 Tablet 1   . ferrous sulfate (FERATAB) 324 mg (65 mg iron) Oral Tablet, Delayed Release (E.C.) Take 1 Tablet (324 mg total) by mouth Once a day 1 Tablet 0   . fluorometholone (FML LIQUIFILM) 0.1 % Ophthalmic Drops, Suspension      . KRILL OIL ORAL Take by mouth     . LUTEIN ORAL Take by mouth     . Magnesium 250 mg Oral Tablet Take 500 mg by mouth Once a day  (Patient not taking: Reported on 04/21/2021)     . melatonin 1 mg Oral Tablet As directed     . multivitamin (CENTRUM SILVER) 400-250 mcg Oral Tablet, Chewable Take 1 Tab by mouth Once a day Taking different brank with no vit K     . nitroGLYCERIN (NITROSTAT) 0.4 mg Sublingual Tablet, Sublingual 1 Tablet (0.4 mg total) by Sublingual route Every 5 minutes as needed for Chest pain for 3 doses over 15 minutes 25 Tablet 3   . triamcinolone acetonide (ARISTOCORT A) 0.1 % Ointment APPLY TO AFFECTED AREA TWICE A DAY AS NEEDED     . UBIDECARENONE (COQ-10 ORAL) Once a day     . warfarin (COUMADIN) 10 mg Oral Tablet Daily or as directed 90 Tab 3   . Warfarin (COUMADIN) 4 mg Oral Tablet Take one tablet by mouth once daily or as directed. 90 Tablet 3     No current facility-administered medications for this visit.       Allergies   Allergen Reactions   . Gluten      Celiac disease       Social History     Tobacco Use   . Smoking status: Never Smoker   . Smokeless tobacco: Never Used   Vaping Use   . Vaping Use: Never used   Substance Use Topics   . Alcohol use: Not Currently     Comment: rare   . Drug use: Never        Family Medical History:     Problem Relation (Age of Onset)    Emphysema Father    Heart Attack General Family Hx    Heart Disease Mother    Hypertension (High Blood Pressure) Mother    No Known Problems Other    Stroke Mother  Review of Systems  General: No fever, chills, sweats, night sweats or weight loss.  HEENT: Negative for frequent or significant headaches, No changes in hearing or vision, no nose bleeds or other nasal problems,  No odynophagia or dysphagia  NECK: Negative for lumps, goiter, pain and significant new swelling  RESPIRATORY: Negative for cough, wheezing or shortness of breath. No hemoptysis  CARDIOVASCULAR: Negative for chest pain, leg swelling or palpitations.  GASTROINTESTINAL: Negative for abdominal discomfort, blood in stools or black stools or change in bowel habits.  GENITOURINARY: No history of dysuria, frequency or incontinence  GYN: Negative for abnormal vaginal bleeding, abnormal vaginal discharge  MUSCULOSKELETAL: Negative for joint pain or swelling, back pain or muscle pain.  NEUROLOGIC: Negative for focal numbness or weakness, headaches and dizziness or syncope  SKIN: Negative for lesion, rash, and itching  PSYCHIATRIC: Negative for sleep disturbance, mood disorder and recent psychosocial stressors  HEMATOLOGIC/LYMPHATIC/IMMUNOLOGIC: No bruising or bleeding, no petechiae, no adenopathy  ENDOCRINE: Negative for cold or heat intolerance, polyuria, polydipsia and goiter.  The remainder of the ROS was negative.    Physical Examination:   Vitals:    04/21/21 1304   BP: 122/74   Pulse: 79   Temp: 36.1 C (97 F)   TempSrc: Thermal Scan   SpO2: 98%   Weight: 108 kg (238 lb)   Height: 1.905 m (6\' 3" )   BMI: 29.81      Body mass index is 29.75 kg/m.    Performance Status: 0  General appearance: well appearing, alert, in no acute distress.  Skin: skin color, texture, tugor normal, no rashes or lesions  Head: nomocephalic, no masses, lesions, tendernesss or abdomalities  Eyes: Anicteric sclera. Pupils are equeally round and reactive to light. Extraocular movements are intact  Ears: external ears normal  Oropharynx: Oropharynx clear, no erthema, no thrush.  Neck: Supple,thyroid not palpable  Lymphatic: no palpable lymphadenopathy  CVS: S1 and S2, no audible murmur/ rub  Chest: Clear to auscultation bilaterally  Abdomen: Soft, non tender, non distended, no hepatosplenomegaly. Bowel sounds audible  Gynecology and Rectal exam:  Deferred  [id number], oriented and grossly intact    LABS  No results found for this or any previous visit (from the past 33 hour(s)).      Radiology  No results were found from the past 30 days.    Last Mammogram (if applicable): No results found for this or any previous visit (from the past 17520 hour(s)).    Last PetScan (if applicable): No results found for this or any previous visit (from the past 17520 hour(s)).    Recent Pathology  No results found for this or any previous visit (from the past 720 hour(s)).    Assessment and Plan  Normocytic anemia    Pathophysiology of anemia was discussed with the possibility of underlying decreased production increased destruction or anemia secondary to underlying chronic conditions or underlying chronic kidney disease.  Also discussed that with history of Crohn's disease and anticoagulant therapy he is at increased risk for malabsorption or GI bleeding.    Patient is to follow-up in 4 weeks to discuss results of anemia workup and additional recommendations.  Patient verbalizes understanding and is agreement this plan.    Return visit 05/04/2021    Carolee Rota, APRN, FNP-BC     This note has been dictated by Carolee Rota, APRN, FNP-BC.  This patient has been seen independently by Carolee Rota, APRN FNP-BC with a physician in clinic and who was available for  consultation.    NPThis note was partially generated using MModal Fluency Direct system, and there may be some incorrect words, spellings, and punctuation that were not noted in checking the note before saving.

## 2021-04-28 ENCOUNTER — Other Ambulatory Visit (HOSPITAL_BASED_OUTPATIENT_CLINIC_OR_DEPARTMENT_OTHER): Payer: Self-pay | Admitting: Family

## 2021-04-28 DIAGNOSIS — D649 Anemia, unspecified: Secondary | ICD-10-CM

## 2021-05-03 ENCOUNTER — Other Ambulatory Visit (INDEPENDENT_AMBULATORY_CARE_PROVIDER_SITE_OTHER): Payer: Self-pay | Admitting: Family Medicine

## 2021-05-03 DIAGNOSIS — M1A9XX0 Chronic gout, unspecified, without tophus (tophi): Secondary | ICD-10-CM

## 2021-05-03 NOTE — Telephone Encounter (Signed)
Last scheduled appointment with you was 12/14/2020, .  Currently scheduled future appointment is 06/10/2021

## 2021-05-04 ENCOUNTER — Other Ambulatory Visit (HOSPITAL_BASED_OUTPATIENT_CLINIC_OR_DEPARTMENT_OTHER): Payer: Self-pay

## 2021-05-04 ENCOUNTER — Ambulatory Visit (INDEPENDENT_AMBULATORY_CARE_PROVIDER_SITE_OTHER): Payer: Medicare Other

## 2021-05-04 ENCOUNTER — Ambulatory Visit (HOSPITAL_BASED_OUTPATIENT_CLINIC_OR_DEPARTMENT_OTHER): Payer: Medicare Other

## 2021-05-04 ENCOUNTER — Encounter (HOSPITAL_BASED_OUTPATIENT_CLINIC_OR_DEPARTMENT_OTHER): Payer: Self-pay | Admitting: Family

## 2021-05-04 ENCOUNTER — Encounter (HOSPITAL_BASED_OUTPATIENT_CLINIC_OR_DEPARTMENT_OTHER): Payer: Self-pay

## 2021-05-04 ENCOUNTER — Other Ambulatory Visit: Payer: Self-pay

## 2021-05-04 ENCOUNTER — Ambulatory Visit: Payer: Medicare Other | Attending: Family | Admitting: Family

## 2021-05-04 VITALS — BP 125/80 | HR 64 | Temp 96.4°F | Ht 75.0 in | Wt 235.0 lb

## 2021-05-04 DIAGNOSIS — K219 Gastro-esophageal reflux disease without esophagitis: Secondary | ICD-10-CM

## 2021-05-04 DIAGNOSIS — Z85820 Personal history of malignant melanoma of skin: Secondary | ICD-10-CM | POA: Insufficient documentation

## 2021-05-04 DIAGNOSIS — D649 Anemia, unspecified: Secondary | ICD-10-CM

## 2021-05-04 DIAGNOSIS — I480 Paroxysmal atrial fibrillation: Secondary | ICD-10-CM

## 2021-05-04 LAB — CBC WITH DIFF
BASOPHIL #: <0.1 10*3/uL (ref <=0.20)
BASOPHIL %: 1 %
EOSINOPHIL #: 0.3 10*3/uL (ref <=0.50)
EOSINOPHIL %: 6 %
HCT: 38 % — ABNORMAL LOW (ref 38.9–52.0)
HGB: 11.5 g/dL — ABNORMAL LOW (ref 13.4–17.5)
IMMATURE GRANULOCYTE #: <0.1 10*3/uL (ref <0.10)
IMMATURE GRANULOCYTE %: 0 % (ref 0–1)
LYMPHOCYTE #: 1.53 10*3/uL (ref 1.00–4.80)
LYMPHOCYTE %: 31 %
MCH: 29 pg (ref 26.0–32.0)
MCHC: 30.3 g/dL — ABNORMAL LOW (ref 31.0–35.5)
MCV: 96 fL (ref 78.0–100.0)
MONOCYTE #: 0.39 10*3/uL (ref 0.20–1.10)
MONOCYTE %: 8 %
MPV: 12 fL (ref 8.7–12.5)
NEUTROPHIL #: 2.61 10*3/uL (ref 1.50–7.70)
NEUTROPHIL %: 54 %
PLATELETS: 231 10*3/uL (ref 150–400)
RBC: 3.96 10*6/uL — ABNORMAL LOW (ref 4.50–6.10)
RDW-CV: 14.6 % (ref 11.5–15.5)
WBC: 4.9 10*3/uL (ref 3.7–11.0)

## 2021-05-04 LAB — RETICULOCYTE COUNT
IMMATURE RETIC FRACTION: 14.7 % (ref 2.7–15.9)
RETICULOCYTE % AUTOMATED: 2.83 % — ABNORMAL HIGH (ref 0.50–2.20)
RETICULOCYTE HEMOGLOBIN EQUIVALENT: 32 pg (ref 28.0–38.0)
RETICULOCYTES COUNT # AUTOMATED: 112.1 10*3/uL — ABNORMAL HIGH (ref 26.0–110.0)

## 2021-05-04 LAB — POCT INR (RESULTS): POCT INR: 3.2

## 2021-05-04 NOTE — Cancer Center Note (Signed)
[facility]  [address]   [place], [place] [address]   [phone]       Name:Dustin Webster  Date of Birth: 1956/09/17  MRN: [id number]  Date of Service:  05/04/2021       Dustin Webster is a 65 y.o. who presents to the [place] at [facility] for evaluation and opinion regarding anemia The referring physician No referring provider defined for this encounter. and I will discuss my recommendations and findings by way of shared medical records, telephone converation or a letter via [place] mail.       Hematology/Oncology History and HPI    Patient was referred by PCP for evaluation of anemia.   Patient states he has been diagnosed with anemia over 15 years ago and has been taking oral iron since initial diagnosis, he has not required iron infusions.  He states recently he developed shortness of breath, fatigue and chest pain.  Patient reports history of AFib and underwent CABG 4 years ago.  Patient is on Ecotrin and Coumadin for management of atrial fib which is managed by Cardiology.  Patient follows closely with Gastroenterology for medical management of celiac disease, last EGD 07/21/2020 per Dr. Stanford Scotland.    Recent lab workup reviewed:  -Records from 09/19/2016 through 03/29/2021 shows hemoglobin ranging from 15.9 to most recently 8.5 on 03/29/2021.  -03/29/2021:  Ferritin 32, iron saturation 11 TIBC 499.     In the interval, patient reports he is doing well.  His only complaint in clinic today is epigastric pain which he contributes to reflux.  Patient currently is taking Pepcid which he states relieves epigastric discomfort.  Patient follows closely with Gastroenterology and actually has H pylori I testing pending.  He denies any complaints of abnormal bleeding, rectal bleeding or melena.  Patient is currently on oral iron therapy 2 tablets daily which he states he is tolerating well.  Patient follows closely with PCP for medical management of comorbidities  and health maintenance    Labs reviewed with white blood cells 4.9 hemoglobin 11.5 platelet count 231. Reticulocyte count ABS 112.1 Ferritin 35, Iron Saturation 18%.     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    Pericardial effusion     Pulmonary emboli (CMS HCC)            Past Surgical History:   Procedure Laterality Date    HX CHOLECYSTECTOMY      HX CORONARY ARTERY BYPASS GRAFT      HX HEART SURGERY  12/2015    HX HERNIA REPAIR      Inguinal     HX LIPOMA RESECTION      Chest    OTHER SURGICAL HISTORY Left 01/2016    Evacuation of hematoma -Left Atrium  at [place]  06/21/2013    melanoma/ear/[facility]    TRANSURETHRAL MICROWAVE  THERAPY  05/24/2018    Dr Leandro Reasoner           Current Outpatient Medications   Medication Sig Dispense Refill    allopurinoL (ZYLOPRIM) 100 mg Oral Tablet TAKE 1 TABLET DAILY FOR PREVENTION OF CALCIUM-CONTAINING KIDNEY STONES 90 Tablet 1    amLODIPine (NORVASC) 10 mg Oral Tablet TAKE 1 TABLET EVERY DAY 90 Tablet 1    ascorbic acid, vitamin C, (VITAMIN C) 500 mg Oral Tablet 500 mg Once a day       aspirin (ECOTRIN) 81 mg Oral Tablet, Delayed Release (E.C.) Once a day      azelastine (OPTIVAR) 0.05 % Ophthalmic Drops       Calcium Carbonate (OS-CAL) 650 mg calcium (1,625 mg) Oral Tablet Every evening       Cholecalciferol, Vitamin D3, 50 mcg (2,000 unit) Oral Capsule Take by mouth      cholestyramine-sucrose (QUESTRAN) 4 gram Oral Powder in Packet Take 1 Packet by mouth Once a day 90 Packet 1     citalopram (CELEXA) 10 mg Oral Tablet Take 1 Tablet (10 mg total) by mouth Once a day 90 Tablet 1    Cyanocobalamin-Cobamamide (B12) 5,000-100 mcg Sublingual Lozenge Place under the tongue      cycloSPORINE (RESTASIS) 0.05 % Ophthalmic Dropperette       evolocumab (REPATHA SURECLICK) 741 mg/mL Subcutaneous Pen Injector Inject one pen (140 mg) under the skin Every 30 days 4 mL 5    famotidine (PEPCID) 40 mg Oral Tablet TAKE 1 TABLET TWICE DAILY 180 Tablet 1    fenofibrate nanocrystallized (TRICOR) 145 mg Oral Tablet Take 1 Tablet (145 mg total) by mouth Every morning with breakfast 90 Tablet 1    ferrous sulfate (FERATAB) 324 mg (65 mg iron) Oral Tablet, Delayed Release (E.C.) Take 1 Tablet (324 mg total) by mouth Once a day 1 Tablet 0    fluorometholone (FML LIQUIFILM) 0.1 % Ophthalmic Drops, Suspension       KRILL OIL ORAL Take by mouth      LUTEIN ORAL Take by mouth      Magnesium 250 mg Oral Tablet Take 500 mg by mouth Once a day  (Patient not taking: Reported on 04/21/2021)      melatonin 1 mg Oral Tablet As directed      multivitamin (CENTRUM SILVER) 400-250 mcg Oral Tablet, Chewable Take 1 Tab by mouth Once a day Taking different brank with no vit K      nitroGLYCERIN (NITROSTAT) 0.4 mg Sublingual Tablet, Sublingual 1 Tablet (0.4 mg total) by Sublingual route Every 5 minutes as needed for Chest pain for 3 doses over 15 minutes 25 Tablet 3    triamcinolone acetonide (ARISTOCORT A) 0.1 % Ointment APPLY TO AFFECTED AREA TWICE A DAY AS NEEDED      UBIDECARENONE (COQ-10 ORAL) Once a day      warfarin (COUMADIN) 10 mg Oral Tablet Daily or as directed 90 Tab 3    Warfarin (COUMADIN) 4 mg Oral Tablet Take one tablet by mouth once daily or as directed. 90 Tablet 3     No current facility-administered medications for this visit.       Allergies   Allergen Reactions    Gluten      Celiac disease       Social History     Tobacco Use    Smoking status: Never Smoker    Smokeless tobacco: Never Used    Vaping Use    Vaping Use: Never used  Substance Use Topics    Alcohol use: Not Currently     Comment: rare    Drug use: Never        Family Medical History:     Problem Relation (Age of Onset)    Emphysema Father    Heart Attack General Family Hx    Heart Disease Mother    Hypertension (High Blood Pressure) Mother    No Known Problems Other    Stroke Mother            Review of Systems  General: No fever, chills, sweats, night sweats or weight loss.  HEENT: Negative for frequent or significant headaches, No changes in hearing or vision, no nose bleeds or other nasal problems, No odynophagia or dysphagia  NECK: Negative for lumps, goiter, pain and significant new swelling  RESPIRATORY: Negative for cough, wheezing or shortness of breath. No hemoptysis  CARDIOVASCULAR: Negative for chest pain, leg swelling or palpitations.  GASTROINTESTINAL: Negative for abdominal discomfort, blood in stools or black stools or change in bowel habits.  GENITOURINARY: No history of dysuria, frequency or incontinence  GYN: Negative for abnormal vaginal bleeding, abnormal vaginal discharge  MUSCULOSKELETAL: Negative for joint pain or swelling, back pain or muscle pain.  NEUROLOGIC: Negative for focal numbness or weakness, headaches and dizziness or syncope  SKIN: Negative for lesion, rash, and itching  PSYCHIATRIC: Negative for sleep disturbance, mood disorder and recent psychosocial stressors  HEMATOLOGIC/LYMPHATIC/IMMUNOLOGIC: No bruising or bleeding, no petechiae, no adenopathy  ENDOCRINE: Negative for cold or heat intolerance, polyuria, polydipsia and goiter.  The remainder of the ROS was negative.    Physical Examination:    Blood pressure 125/80, pulse 64, temperature 35.8 C (96.4 F), temperature source Thermal Scan, height 1.905 m (6' 3" ), weight 107 kg (235 lb), SpO2 97 %.      Performance Status: 0  General appearance: well appearing, alert, in no acute distress.  Skin: skin color, texture, tugor normal, no rashes or  lesions  Head: nomocephalic, no masses, lesions, tendernesss or abdomalities  Eyes: Anicteric sclera. Pupils are equeally round and reactive to light. Extraocular movements are intact  Ears: external ears normal  Oropharynx: Oropharynx clear, no erthema, no thrush.  Neck: Supple,thyroid not palpable  Lymphatic: no palpable lymphadenopathy  CVS: S1 and S2, no audible murmur/ rub  Chest: Clear to auscultation bilaterally  Abdomen: Soft, non tender, non distended, no hepatosplenomegaly. Bowel sounds audible  Gynecology and Rectal exam: Deferred  [id number], oriented and grossly intact    LABS  No results found for this or any previous visit (from the past 53 hour(s)).      Radiology  No results were found from the past 30 days.    Last Mammogram (if applicable): No results found for this or any previous visit (from the past 17520 hour(s)).    Last PetScan (if applicable): No results found for this or any previous visit (from the past 17520 hour(s)).    Recent Pathology  No results found for this or any previous visit (from the past 720 hour(s)).    Assessment and Plan  Normocytic anemia    Pathophysiology of anemia was discussed with the possibility of underlying decreased production increased destruction or anemia secondary to underlying chronic conditions or underlying chronic kidney disease.  Also discussed that with history of Crohn's disease and anticoagulant therapy he is at increased risk for malabsorption or GI bleeding.    Lab results reviewed and discussed with patient.  Patient is to continue oral  iron 2 tablets daily.  He is advised to continue to follow with Gastroenterology for medical management of reflux disease and epigastric discomfort.  Patient verbalizes understanding and is agreement this plan.    Return visit    I am scribing for, and in the presence of, Carolee Rota, FNP for services provided on 05/04/2021.  Nehemiah Massed, [place] Thomas, [place]  05/04/2021, 10:45    I personally  performed the services described in this documentation, as scribed  in my presence, and it is both accurate  and complete    Carolee Rota, APRN, FNP-BC  This patient has been seen independently by Carolee Rota, APRN FNP-BC with a physician in clinic and who was available for consultation.

## 2021-05-07 LAB — HELICOBACTER PYLORI BREATH TEST: H. PYLORI,UREA BREATH TEST: NOT DETECTED

## 2021-05-13 ENCOUNTER — Encounter (HOSPITAL_BASED_OUTPATIENT_CLINIC_OR_DEPARTMENT_OTHER): Payer: Self-pay

## 2021-05-13 ENCOUNTER — Encounter (HOSPITAL_BASED_OUTPATIENT_CLINIC_OR_DEPARTMENT_OTHER): Payer: Self-pay | Admitting: UROLOGY

## 2021-05-24 ENCOUNTER — Telehealth (HOSPITAL_BASED_OUTPATIENT_CLINIC_OR_DEPARTMENT_OTHER): Payer: Self-pay | Admitting: Family

## 2021-05-24 NOTE — Telephone Encounter (Signed)
Patient returning nurse call about test results. Please call [phone]

## 2021-05-24 NOTE — Telephone Encounter (Signed)
Called patient and left message.

## 2021-05-25 NOTE — Telephone Encounter (Signed)
Call returned by pt, states has been on vacation & did not receive our phone messages. Advised that H. Pylori test was negative, v/u. Pt is also asking about recommendations for his anemia? States he saw Carolee Rota previously & her recommendation was for a "pill" study for evaluation & to follow up with GI. Please advise.    Dustin Larsen, LPN 80/06/34 94:94

## 2021-05-26 ENCOUNTER — Telehealth (HOSPITAL_BASED_OUTPATIENT_CLINIC_OR_DEPARTMENT_OTHER): Payer: Self-pay | Admitting: Nurse Practitioner

## 2021-05-26 NOTE — Telephone Encounter (Signed)
-----   Message from [place], [place] sent at 05/25/2021  2:22 PM EDT -----  Regarding: RE: H. Pylori results  Unable to respond since this is attached to a patient message. Recent labs look ok with his iron levels. Would hold off on capsule endoscopy referral.  Fara Chute, FNP-C   ----- Message -----  From: Daymon Larsen, LPN  Sent: 2/95/7473   1:44 PM EDT  To: Fabienne Bruns [facility]  Subject: Lemmie Evens Pylori results                                ----- Message from Daymon Larsen, LPN sent at 02/15/7095  1:44 PM EDT -----       ----- Message sent from Awilda Metro, LPN to Arlyss Queen D at 05/13/2021  8:12 AM -----   Guido Sander,                Here are the results for the H.Pylori test that you had completed. If you have any questions or concerns please give [place] a call.     Negative for H.Pylori  Sheila Oats, FNP-C    Brittiany Primm, LPN

## 2021-05-26 NOTE — Telephone Encounter (Signed)
Called patient regarding message per Fara Chute. Spoke with patients wife, she verbalized understanding.    Charlean Merl, LPN 96/43/83 81:84

## 2021-05-26 NOTE — Telephone Encounter (Signed)
Pt notified, verbalized understanding. Denies any further questions.     Daymon Larsen, LPN 95/84/41 71:27

## 2021-05-26 NOTE — Telephone Encounter (Signed)
-----   Message from [place], [place] sent at 05/25/2021  2:22 PM EDT -----  Regarding: RE: H. Pylori results  Unable to respond since this is attached to a patient message. Recent labs look ok with his iron levels. Would hold off on capsule endoscopy referral.  Fara Chute, FNP-C   ----- Message -----  From: Daymon Larsen, LPN  Sent: 9/50/9326   1:44 PM EDT  To: Fabienne Bruns [facility]  Subject: Dustin Webster Pylori results                                ----- Message from Daymon Larsen, LPN sent at 05/26/4579  1:44 PM EDT -----       ----- Message sent from Awilda Metro, LPN to Dustin Webster at 05/13/2021  8:12 AM -----   Dustin Webster,                Here are the results for the H.Pylori test that you had completed. If you have any questions or concerns please give [place] a call.     Negative for H.Pylori  Sheila Oats, FNP-C    Brittiany Primm, LPN

## 2021-06-02 ENCOUNTER — Other Ambulatory Visit: Payer: Self-pay

## 2021-06-02 ENCOUNTER — Ambulatory Visit (INDEPENDENT_AMBULATORY_CARE_PROVIDER_SITE_OTHER): Payer: Medicare Other

## 2021-06-02 DIAGNOSIS — I480 Paroxysmal atrial fibrillation: Secondary | ICD-10-CM

## 2021-06-02 LAB — POCT INR (RESULTS): POCT INR: 3.5

## 2021-06-10 ENCOUNTER — Other Ambulatory Visit: Payer: Self-pay

## 2021-06-10 ENCOUNTER — Ambulatory Visit (INDEPENDENT_AMBULATORY_CARE_PROVIDER_SITE_OTHER): Payer: Medicare Other | Admitting: Family Medicine

## 2021-06-10 ENCOUNTER — Other Ambulatory Visit (INDEPENDENT_AMBULATORY_CARE_PROVIDER_SITE_OTHER): Payer: Medicare Other

## 2021-06-10 VITALS — BP 110/70 | HR 71 | Temp 96.3°F | Ht 75.0 in | Wt 238.0 lb

## 2021-06-10 DIAGNOSIS — I480 Paroxysmal atrial fibrillation: Secondary | ICD-10-CM

## 2021-06-10 DIAGNOSIS — E782 Mixed hyperlipidemia: Secondary | ICD-10-CM

## 2021-06-10 DIAGNOSIS — Z6829 Body mass index (BMI) 29.0-29.9, adult: Secondary | ICD-10-CM

## 2021-06-10 DIAGNOSIS — N183 Chronic kidney disease, stage 3 unspecified (CMS HCC): Secondary | ICD-10-CM

## 2021-06-10 DIAGNOSIS — I2699 Other pulmonary embolism without acute cor pulmonale: Secondary | ICD-10-CM

## 2021-06-10 DIAGNOSIS — Z125 Encounter for screening for malignant neoplasm of prostate: Secondary | ICD-10-CM

## 2021-06-10 DIAGNOSIS — M1A9XX0 Chronic gout, unspecified, without tophus (tophi): Secondary | ICD-10-CM

## 2021-06-10 DIAGNOSIS — I10 Essential (primary) hypertension: Secondary | ICD-10-CM

## 2021-06-10 DIAGNOSIS — I25110 Atherosclerotic heart disease of native coronary artery with unstable angina pectoris: Secondary | ICD-10-CM

## 2021-06-10 DIAGNOSIS — D649 Anemia, unspecified: Secondary | ICD-10-CM

## 2021-06-10 DIAGNOSIS — E1122 Type 2 diabetes mellitus with diabetic chronic kidney disease: Secondary | ICD-10-CM

## 2021-06-10 LAB — COMPREHENSIVE METABOLIC PNL, FASTING
ALBUMIN: 4.8 g/dL (ref 3.5–5.0)
ALKALINE PHOSPHATASE: 45 U/L (ref 38–126)
ALT (SGPT): 61 U/L — ABNORMAL HIGH (ref <=50)
ANION GAP: 9 mmol/L
AST (SGOT): 48 U/L (ref 17–59)
BILIRUBIN TOTAL: 0.2 mg/dL (ref 0.2–5.0)
BUN/CREA RATIO: 18
BUN: 25 mg/dL — ABNORMAL HIGH (ref 9–20)
CALCIUM: 9.6 mg/dL (ref 8.4–10.2)
CHLORIDE: 107 mmol/L (ref 98–107)
CO2 TOTAL: 26 mmol/L (ref 22–30)
CREATININE: 1.36 mg/dL — ABNORMAL HIGH (ref 0.66–1.25)
ESTIMATED GFR: 57 mL/min/{1.73_m2} — ABNORMAL LOW (ref >=60)
GLUCOSE: 153 mg/dL — ABNORMAL HIGH (ref 74–106)
POTASSIUM: 4.9 mmol/L (ref 3.5–5.1)
PROTEIN TOTAL: 7.7 g/dL (ref 6.3–8.2)
SODIUM: 142 mmol/L (ref 137–145)

## 2021-06-10 LAB — CBC WITH DIFF
BASOPHIL #: 0.07 10*3/uL (ref 0.00–0.10)
BASOPHIL %: 2 % — ABNORMAL HIGH (ref 0–1)
EOSINOPHIL #: 0.3 10*3/uL (ref 0.00–0.40)
EOSINOPHIL %: 6 % (ref 0–6)
HCT: 45.1 % (ref 40.7–52.3)
HGB: 13.9 g/dL (ref 12.5–16.9)
LYMPHOCYTE #: 1.59 10*3/uL (ref 1.00–3.70)
LYMPHOCYTE %: 34 % (ref 14–45)
MCH: 27.9 pg (ref 27.0–33.4)
MCHC: 30.8 g/dL — ABNORMAL LOW (ref 31.0–37.0)
MCV: 90.4 fL (ref 84.1–99.3)
MONOCYTE #: 0.55 10*3/uL (ref 0.00–0.70)
MONOCYTE %: 12 % (ref 0–14)
NEUTROPHIL #: 2.18 10*3/uL (ref 1.50–7.00)
NEUTROPHIL %: 47 % (ref 43–80)
PLATELETS: 220 10*3/uL (ref 129–381)
RBC: 4.99 10*6/uL (ref 4.09–5.89)
RDW: 14 % (ref 10.8–15.2)
WBC: 4.7 10*3/uL (ref 2.6–9.8)

## 2021-06-10 LAB — LIPID PANEL
CHOLESTEROL: 129 mg/dL (ref 0–199)
HDL CHOL: 41 mg/dL (ref 40–60)
LDL CALC: 39 mg/dL (ref 0–130)
TRIGLYCERIDES: 245 mg/dL — ABNORMAL HIGH (ref 0–149)
VLDL CALC: 49 mg/dL

## 2021-06-10 LAB — HGA1C (HEMOGLOBIN A1C WITH EST AVG GLUCOSE)
ESTIMATED AVERAGE GLUCOSE: 154 mg/dL
HEMOGLOBIN A1C: 7 % — ABNORMAL HIGH (ref 4.3–6.1)

## 2021-06-10 LAB — THYROID STIMULATING HORMONE (SENSITIVE TSH): TSH: 2.41 u[IU]/mL (ref 0.465–4.680)

## 2021-06-10 LAB — VITAMIN D 25 TOTAL: VITAMIN D 25, TOTAL: 45.1 ng/mL (ref 30.00–100.00)

## 2021-06-10 LAB — URIC ACID: URIC ACID: 4.8 mg/dL (ref 3.5–8.5)

## 2021-06-10 LAB — MAGNESIUM: MAGNESIUM: 1.9 mg/dL (ref 1.9–2.3)

## 2021-06-10 LAB — PSA SCREENING: PSA: 0.2 ng/mL (ref 0.0–4.0)

## 2021-06-10 LAB — VITAMIN B12: VITAMIN B 12: 957 pg/mL — ABNORMAL HIGH (ref 239–931)

## 2021-06-10 LAB — IRON: IRON: 61 ug/dL (ref 49–181)

## 2021-06-10 LAB — FERRITIN: FERRITIN: 51 ng/mL (ref 18–464)

## 2021-06-10 MED ORDER — CHOLESTYRAMINE (WITH SUGAR) 4 GRAM POWDER FOR SUSP IN A PACKET
1.0000 | Freq: Every day | ORAL | 1 refills | Status: DC
Start: 2021-06-10 — End: 2021-08-23

## 2021-06-10 NOTE — Ancillary Notes (Signed)
Department of Community Practice     Venipuncture performed in office on left arm antecubital vein, dry pressure dressing was applied to site and patient tolerated it well.  Specimen was centrifuged, aliquoted as needed and specimen was labeled and packaged for transport.    Dustin Webster  06/10/2021, 11:16

## 2021-06-10 NOTE — Ancillary Notes (Deleted)
Department of Community Practice     Venipuncture performed in office on right arm antecubital vein, dry pressure dressing was applied to site and patient tolerated it well.  Specimen was centrifuged, aliquoted as needed and specimen was labeled and packaged for transport.    Larina Earthly  06/10/2021, 11:14

## 2021-06-13 NOTE — Assessment & Plan Note (Signed)
Continue same for now

## 2021-06-13 NOTE — Progress Notes (Signed)
PRIMARY CARE, MID- [facility]  [address] [place] [address]    Progress Note    Name: Dustin Webster MRN:  [id number]   Date: 06/10/2021 Age: 65 y.o.         Reason for Visit: Follow Up 6 Months (Right ankle pain x 1.5 week with swelling. Not taking NSAID, using ice.)    History of Present Illness  Dustin Webster is a 65 y.o. 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  No chest pain or dyspnea or hemoptysis      Mixed Hyperlipidemia    Current Medication: repatha TriCor 145 mg daily   Side Effects of Medication: none  History of Statin Intolerance: yes  Diet: fairly healthy gluten free  Exercise: starting     Lab Results   Component Value Date    CHOLESTEROL 78 12/10/2020    HDLCHOL 46 12/10/2020    LDLCHOL 11 12/10/2020    LDLCHOLDIR 91 05/07/2018    TRIG 106 12/10/2020             Htn (Hypertension)    Blood Pressure Medications: Norvasc 10 mg daily,   Blood Pressure Measurements at home: normal   Concerning Symptoms: no chest pain or dyspnea no edema   Medication Side Effects: none  Diet: fairly healthy  gluten free   [place]: cardiology           Chronic Gout    On allopurinol - has painful knot right ankle - denies injury but was mowing awkwardly on a hill not sure if that could do it - no other injury etc - not entire ankle etc      Cad (Coronary Artery  Disease), Native Coronary Artery      Aspirin: Yes  Beta blockers: Yes  Statins: repatha due to intolerance     Interval history:  Continues to do well with medications - due for labs  follows with cardiology no chest pain or dyspnea or edema does watch diet fairly well starting to exercise more          Past Medical History:   Diagnosis Date   . Anemia    . Atrial fibrillation (CMS HCC)    . BPH associated with nocturia    . CAD (coronary artery disease)    . Chronic depression 05/23/2018   . CKD (chronic kidney disease)    . COVID-19 08/03/2020   . Diet-controlled diabetes mellitus (CMS [place]) 05/23/2018   . DVT (deep venous thrombosis) (CMS HCC)    . Gastroesophageal reflux disease without esophagitis 05/23/2018   . Insomnia 05/23/2018   . 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  Pericardial effusion    . Pulmonary emboli (CMS [facility])            Past Surgical History:   Procedure Laterality Date   . HX CHOLECYSTECTOMY     . HX CORONARY ARTERY BYPASS GRAFT     . HX HEART SURGERY  12/2015   . HX HERNIA REPAIR      Inguinal    . HX LIPOMA RESECTION      Chest   . OTHER SURGICAL HISTORY Left 01/2016    Evacuation of hematoma -Left Atrium  at [facility]    . SKIN CANCER EXCISION  06/21/2013    melanoma/ear/[facility]   . TRANSURETHRAL MICROWAVE THERAPY  05/24/2018    Dr Leandro Reasoner           Medications  allopurinoL (ZYLOPRIM) 100 mg Oral Tablet, TAKE 1 TABLET DAILY FOR PREVENTION OF CALCIUM-CONTAINING KIDNEY STONES  amLODIPine (NORVASC) 10 mg Oral Tablet, TAKE 1 TABLET EVERY DAY  ascorbic acid, vitamin C, (VITAMIN C) 500 mg Oral Tablet, 500 mg Once a day   aspirin (ECOTRIN) 81 mg Oral Tablet, Delayed Release (E.C.), Once a day  azelastine (OPTIVAR) 0.05 % Ophthalmic Drops,   Calcium Carbonate (OS-CAL) 650 mg calcium (1,625 mg) Oral Tablet, Every evening   Cholecalciferol, Vitamin D3, 50 mcg (2,000 unit) Oral Capsule, Take by mouth  citalopram (CELEXA) 10 mg Oral Tablet, Take 1 Tablet (10 mg total) by mouth Once a  day  Cyanocobalamin-Cobamamide (B12) 5,000-100 mcg Sublingual Lozenge, Place under the tongue  cycloSPORINE (RESTASIS) 0.05 % Ophthalmic Dropperette,   evolocumab (REPATHA SURECLICK) [id number] mg/mL Subcutaneous Pen Injector, Inject one pen (140 mg) under the skin Every 30 days  famotidine (PEPCID) 40 mg Oral Tablet, TAKE 1 TABLET TWICE DAILY  fenofibrate nanocrystallized (TRICOR) 145 mg Oral Tablet, Take 1 Tablet (145 mg total) by mouth Every morning with breakfast  ferrous sulfate (FERATAB) 324 mg (65 mg iron) Oral Tablet, Delayed Release (E.C.), Take 1 Tablet (324 mg total) by mouth Once a day  fluorometholone (FML LIQUIFILM) 0.1 % Ophthalmic Drops, Suspension,   KRILL OIL ORAL, Take by mouth  LUTEIN ORAL, Take by mouth  melatonin 1 mg Oral Tablet, As directed  multivitamin (CENTRUM SILVER) 400-250 mcg Oral Tablet, Chewable, Take 1 Tab by mouth Once a day Taking different brank with no vit K  nitroGLYCERIN (NITROSTAT) 0.4 mg Sublingual Tablet, Sublingual, 1 Tablet (0.4 mg total) by Sublingual route Every 5 minutes as needed for Chest pain for 3 doses over 15 minutes  triamcinolone acetonide (ARISTOCORT A) 0.1 % Ointment, APPLY TO AFFECTED AREA TWICE A DAY AS NEEDED  UBIDECARENONE (COQ-10 ORAL), Once a day  warfarin (COUMADIN) 10 mg Oral Tablet, Daily or as directed  Warfarin (COUMADIN) 4 mg Oral Tablet, Take one tablet by mouth once daily or as directed.  cholestyramine-sucrose (QUESTRAN) 4 gram Oral Powder in Packet, Take 1 Packet by mouth Once a day  Magnesium 250 mg Oral Tablet, Take 500 mg by mouth Once a day  (Patient not taking: No sig reported)    No facility-administered medications prior to visit.      Allergies   Allergen Reactions   . Gluten      Celiac disease       Family Medical History:     Problem Relation (Age of Onset)    Emphysema Father    Heart Attack General Family Hx    Heart Disease Mother    Hypertension (High Blood Pressure) Mother    No Known Problems  Other    Stroke Mother            Social  History     Tobacco Use   . Smoking status: Never Smoker   . Smokeless tobacco: Never Used   Vaping Use   . Vaping Use: Never used   Substance Use Topics   . Alcohol use: Not Currently     Comment: rare   . Drug use: Never       Review of Systems  Pertinent positives are listed in HPI    Physical Exam:  BP 110/70 (Site: Left, Patient Position: Sitting, Cuff Size: Adult)   Pulse 71   Temp 35.7 C (96.3 F) (Thermal Scan)   Ht 1.905 m ('6\' 3"'$ )   Wt 108 kg (238 lb)   SpO2 98%   BMI 29.75 kg/m       Physical Exam  Vitals and nursing note reviewed.   Constitutional:       General: He is not in acute distress.     Appearance: Normal appearance.   Cardiovascular:      Rate and Rhythm: Normal rate and regular rhythm.   Pulmonary:      Effort: Pulmonary effort is normal.      Breath sounds: Normal breath sounds.   Abdominal:      General: Bowel sounds are normal.      Palpations: Abdomen is soft.      Tenderness: There is no abdominal tenderness.   Musculoskeletal:      Cervical back: Neck supple.      Right lower leg: No edema.      Left lower leg: No edema.      Comments: But does have slightly warm area above lateral malleolus right ? Tophus?    Lymphadenopathy:      Cervical: No cervical adenopathy.   Neurological:      Mental Status: He is alert and oriented to person, place, and time.   Psychiatric:         Mood and Affect: Mood normal.                 Assessment/Plan:    Problem List Items Addressed This Visit        Cardiovascular System    Pulmonary emboli (CMS HCC)     Continue same INR followed by cardiology          Mixed hyperlipidemia     Continue with medications and nutrition monitor fasting labs          HTN (hypertension)     Continue same monitor labs          CAD (coronary artery disease), native coronary artery     Continue same monitor fasting labs          Paroxysmal atrial fibrillation (CMS HCC)     Continue  same for now             Nephrology    Stage 3 chronic kidney disease (CMS HCC)     Relevant Orders    VITAMIN D 25 TOTAL (Completed)    Type 2 diabetes mellitus with diabetic chronic kidney disease (CMS HCC)     Monitor labs continue to encourage nutrition and exercise no ace at present has previous issues with hypotension but continue to consider          Relevant Orders    HGA1C (HEMOGLOBIN A1C WITH EST AVG GLUCOSE) (Completed)    LIPID PANEL (Completed)  COMPREHENSIVE METABOLIC PNL, FASTING (Completed)    CBC/DIFF (Completed)    MAGNESIUM (Completed)    THYROID STIMULATING HORMONE (SENSITIVE TSH) (Completed)       Hematologic/Lymphatic    Anemia, unspecified type    Relevant Orders    FERRITIN (Completed)    IRON (Completed)    VITAMIN B12 (Completed)       Rheumatologic    Chronic gout     ? If small gouty tophus - monitor labs adjust allopurinol if needed          Relevant Orders    URIC ACID (Completed)      Other Visit Diagnoses     BMI 29.0-29.9,adult    -  Primary    Prostate cancer screening        Relevant Orders    PSA SCREENING (Completed)           Orders Placed This Encounter   . HGA1C (HEMOGLOBIN A1C WITH EST AVG GLUCOSE)   . LIPID PANEL   . COMPREHENSIVE METABOLIC PNL, FASTING   . CBC/DIFF   . MAGNESIUM   . THYROID STIMULATING HORMONE (SENSITIVE TSH)   . VITAMIN D 25 TOTAL   . PSA SCREENING   . URIC ACID   . FERRITIN   . IRON   . VITAMIN B12   . cholestyramine-sucrose (QUESTRAN) 4 gram Oral Powder in Packet      Medications Discontinued During This Encounter   Medication Reason   . Magnesium 250 mg Oral Tablet Patient states no longer taking   . cholestyramine-sucrose (QUESTRAN) 4 gram Oral Powder in Packet Reorder         Follow up: Return in about 6 months (around 12/11/2021), or if symptoms worsen or fail to improve.    Seek medical attention for new or worsening symptoms.    Patient has been seen in this clinic within the last 3 years.     [name], MD     This note was partially created using MModal Fluency Direct system (voice recognition software) and is inherently  subject to errors including those of syntax and "sound-alike" substitutions which may escape proofreading.  In such instances, original meaning may be extrapolated by contextual derivation.

## 2021-06-13 NOTE — Assessment & Plan Note (Signed)
Continue same INR followed by cardiology

## 2021-06-13 NOTE — Assessment & Plan Note (Signed)
conitnue same monitor labs and monitor for any bleeding while on coumadin

## 2021-06-13 NOTE — Assessment & Plan Note (Signed)
Continue same monitor labs

## 2021-06-13 NOTE — Assessment & Plan Note (Signed)
Continue with medications and nutrition monitor fasting labs

## 2021-06-13 NOTE — Assessment & Plan Note (Signed)
Continue same monitor fasting labs

## 2021-06-13 NOTE — Assessment & Plan Note (Signed)
?   If small gouty tophus - monitor labs adjust allopurinol if needed

## 2021-06-13 NOTE — Assessment & Plan Note (Signed)
Monitor labs continue to encourage nutrition and exercise no ace at present has previous issues with hypotension but continue to consider

## 2021-06-15 ENCOUNTER — Telehealth (INDEPENDENT_AMBULATORY_CARE_PROVIDER_SITE_OTHER): Payer: Self-pay | Admitting: Family Medicine

## 2021-06-15 DIAGNOSIS — U071 COVID-19: Secondary | ICD-10-CM

## 2021-06-15 NOTE — Telephone Encounter (Signed)
Yesterday developed sore throat and and sinus congestion, sneezing, and runny nose and slight cough    No fever or myalgias at this time    Tested at home for covid and was positive. Questions if candidate for Paxlovid      **Also notes that his wife was in here today for an appt.  She has not tested as of yet, as she is asymptomatic and plans to test tomorrow.

## 2021-06-15 NOTE — Telephone Encounter (Signed)
Yes would be candidate for paxlovid but would monitor BP closely and take half dose amlodipine while taking

## 2021-06-16 ENCOUNTER — Other Ambulatory Visit: Payer: Self-pay

## 2021-06-16 MED ORDER — NIRMATRELVIR 300 MG (150 MG X2)-RITONAVIR 100 MG TABLET,DOSE PACK
ORAL_TABLET | ORAL | 0 refills | Status: DC
Start: 2021-06-16 — End: 2021-08-23

## 2021-06-16 NOTE — Telephone Encounter (Signed)
Tried reaching patient but phone off and not taking Vm's.

## 2021-06-17 ENCOUNTER — Other Ambulatory Visit: Payer: Self-pay

## 2021-07-01 ENCOUNTER — Ambulatory Visit (INDEPENDENT_AMBULATORY_CARE_PROVIDER_SITE_OTHER): Payer: Medicare Other

## 2021-07-01 ENCOUNTER — Other Ambulatory Visit: Payer: Self-pay

## 2021-07-01 DIAGNOSIS — I480 Paroxysmal atrial fibrillation: Secondary | ICD-10-CM

## 2021-07-01 LAB — POCT INR (RESULTS): POCT INR: 2.4

## 2021-07-07 ENCOUNTER — Other Ambulatory Visit (INDEPENDENT_AMBULATORY_CARE_PROVIDER_SITE_OTHER): Payer: Self-pay | Admitting: Family Medicine

## 2021-07-07 DIAGNOSIS — I10 Essential (primary) hypertension: Secondary | ICD-10-CM

## 2021-07-07 DIAGNOSIS — K219 Gastro-esophageal reflux disease without esophagitis: Secondary | ICD-10-CM

## 2021-07-07 NOTE — Telephone Encounter (Signed)
Last scheduled appointment with you was 06/10/2021, Currently scheduled future appointment is 12/10/2021,     Patient has been seen within the last year: Yes.    Confirmed preferred pharmacy for this refill encounter is   [facility] - [place], [place]    [place] [address]    Phone: [phone] Fax: [phone]    Hours: Not open 24 hours    [id number] [place]   [id number]   Phone: [phone]: [phone]   Hours: Not open 24 hours      .     RJanora Norlander MA  07/07/2021, 07:39

## 2021-07-20 ENCOUNTER — Other Ambulatory Visit: Payer: Self-pay

## 2021-07-20 ENCOUNTER — Other Ambulatory Visit (HOSPITAL_BASED_OUTPATIENT_CLINIC_OR_DEPARTMENT_OTHER): Payer: Self-pay | Admitting: UROLOGY

## 2021-07-20 DIAGNOSIS — N401 Enlarged prostate with lower urinary tract symptoms: Secondary | ICD-10-CM

## 2021-07-21 ENCOUNTER — Ambulatory Visit (HOSPITAL_BASED_OUTPATIENT_CLINIC_OR_DEPARTMENT_OTHER): Payer: Medicare Other

## 2021-07-21 ENCOUNTER — Ambulatory Visit: Payer: Medicare Other | Attending: UROLOGY | Admitting: UROLOGY

## 2021-07-21 ENCOUNTER — Encounter (HOSPITAL_BASED_OUTPATIENT_CLINIC_OR_DEPARTMENT_OTHER): Payer: Self-pay | Admitting: UROLOGY

## 2021-07-21 ENCOUNTER — Other Ambulatory Visit: Payer: Self-pay

## 2021-07-21 VITALS — BP 118/74 | Ht 75.0 in | Wt 234.0 lb

## 2021-07-21 DIAGNOSIS — N138 Other obstructive and reflux uropathy: Secondary | ICD-10-CM | POA: Insufficient documentation

## 2021-07-21 DIAGNOSIS — N401 Enlarged prostate with lower urinary tract symptoms: Secondary | ICD-10-CM

## 2021-07-21 LAB — URINALYSIS, MACROSCOPIC
BILIRUBIN: NOT DETECTED mg/dL
BLOOD: NOT DETECTED mg/dL
GLUCOSE: NOT DETECTED mg/dL
KETONES: NOT DETECTED mg/dL
LEUKOCYTES: NOT DETECTED WBCs/uL
NITRITE: NOT DETECTED
PH: 7.5 (ref 5.0–?)
PROTEIN: NOT DETECTED mg/dL
SPECIFIC GRAVITY: 1.015 (ref 1.005–?)
UROBILINOGEN: 0.2 mg/dL

## 2021-07-21 NOTE — H&P (Signed)
[facility]   [address]   [place], [place] [address]    Urology, Medical Office [facility]  [address]  [place] [address]  [phone]    Date: 07/21/2021  Name: Dustin Webster  Age: 65 y.o.      Chief of Complaint:   Chief Complaint   Patient presents with   . Benign Prostatic Hypertrophy     PSA 06-10-21 ( 0.2)       Dustin Webster is a 65 y.o. male  who presents to the practice today for follow-up on his history of BPH and prostatism.  He had TUMT 7/19 with significant improvement of his symptoms.  He reports that recently his symptoms have gradually increased.  He is unable to take Flomax due to hypotension.  Most recent PSA is 0.2 7/22.        Patient Active Problem List    Diagnosis Date Noted   . Type 2 diabetes mellitus with diabetic chronic kidney disease (CMS [place]) 12/16/2020   . COVID-19 08/04/2020   . Stage 3 chronic kidney disease (CMS [place]) 06/11/2020   . Paroxysmal atrial fibrillation (CMS HCC) 02/03/2020   . Change in bowel habits 12/19/2019   . Anemia, unspecified type 12/19/2019   . Gastroesophageal reflux disease, unspecified whether esophagitis present 12/19/2019   . Infectious disease contact 10/21/2019   . Pharyngitis, acute 10/21/2019   . BPH with obstruction/lower urinary tract symptoms 07/11/2018   . Hypertrophy of prostate 05/23/2018   . Malignant melanoma of right ear (CMS [place]) 05/23/2018   . Diet-controlled diabetes mellitus (CMS [place]) 05/23/2018   . Gastroesophageal reflux disease without esophagitis 05/23/2018   . Chronic depression 05/23/2018   . History of DVT of lower extremity 05/23/2018   . External hemorrhoid 05/23/2018   . Leg cramps, sleep related 05/23/2018   . Insomnia 05/23/2018   . Allergic rhinitis 05/23/2018   . History of open heart surgery 05/23/2018   . Unstable angina (CMS HCC) 05/07/2018   . Chest pain 05/07/2018   . Anemia    . Pulmonary emboli (CMS HCC) 01/14/2017   . Mixed hyperlipidemia 01/14/2017   . HTN (hypertension) 01/16/2016   . Pericardial  effusion 01/16/2016   . Benign prostatic hyperplasia 01/02/2016   . Chronic gout 01/02/2016   . Atelectasis 12/31/2015   . Pain, postoperative, acute 12/30/2015   . CAD (coronary artery disease), native coronary artery 12/26/2015   . Celiac disease 12/26/2015   . VTE (venous thromboembolism) 12/26/2015         Past Medical History:   Diagnosis Date   . Anemia    . Atrial fibrillation (CMS HCC)    . BPH associated with nocturia    . CAD (coronary artery disease)    . Chronic depression 05/23/2018   . CKD (chronic kidney disease)    . COVID-19 08/03/2020   . Diet-controlled diabetes mellitus (CMS [place]) 05/23/2018   . DVT (deep venous thrombosis) (CMS HCC)    . Gastroesophageal reflux disease without esophagitis 05/23/2018   . Insomnia 05/23/2018   . 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 Pericardial effusion    . Pulmonary emboli (CMS [facility])          Past Surgical History:   Procedure Laterality Date   . HX CHOLECYSTECTOMY     . HX CORONARY ARTERY BYPASS GRAFT     . HX HEART SURGERY  12/2015   . HX HERNIA REPAIR  Inguinal    . HX LIPOMA RESECTION      Chest   . OTHER SURGICAL HISTORY Left 01/2016    Evacuation of hematoma -Left Atrium  at [facility]    . SKIN CANCER EXCISION  06/21/2013    melanoma/ear/[facility]   . TRANSURETHRAL MICROWAVE THERAPY  05/24/2018    Dr Leandro Reasoner         Allergies   Allergen Reactions   . Gluten      Celiac disease     Current Outpatient Medications   Medication Sig Dispense Refill   . allopurinoL (ZYLOPRIM) 100 mg Oral Tablet TAKE 1 TABLET DAILY FOR PREVENTION OF CALCIUM-CONTAINING KIDNEY STONES 90 Tablet 1   . amLODIPine (NORVASC) 10 mg Oral Tablet Take 1 Tablet (10 mg total) by mouth Once a day 90 Tablet 1   . ascorbic acid, vitamin C, (VITAMIN C) 500 mg Oral Tablet 500 mg Once a day      . aspirin (ECOTRIN) 81 mg Oral Tablet, Delayed Release (E.C.) Once a day     . azelastine (OPTIVAR) 0.05 % Ophthalmic Drops      . Calcium Carbonate (OS-CAL) 650 mg calcium (1,625 mg) Oral Tablet Every  evening      . Cholecalciferol, Vitamin D3, 50 mcg (2,000 unit) Oral Capsule Take by mouth     . cholestyramine-sucrose (QUESTRAN) 4 gram Oral Powder in Packet Take 1 Packet by mouth Once a day 90 Packet 1   . citalopram (CELEXA) 10 mg Oral Tablet Take 1 Tablet (10 mg total) by mouth Once a day 90 Tablet 1   . Cyanocobalamin-Cobamamide (B12) 5,000-100 mcg Sublingual Lozenge Place under the tongue     . cycloSPORINE (RESTASIS) 0.05 % Ophthalmic Dropperette      . evolocumab (REPATHA SURECLICK) 924 mg/mL Subcutaneous Pen Injector Inject one pen (140 mg) under the skin Every 30 days 4 mL 5   . famotidine (PEPCID) 40 mg Oral Tablet Take 1 Tablet (40 mg total) by mouth Twice daily 180 Tablet 1   . fenofibrate nanocrystallized (TRICOR) 145 mg Oral Tablet Take 1 Tablet (145 mg total) by mouth Every morning with breakfast 90 Tablet 1   . ferrous sulfate (FERATAB) 324 mg (65 mg iron) Oral Tablet, Delayed Release (E.C.) Take 1 Tablet (324 mg total) by mouth Once a day 1 Tablet 0   . fluorometholone (FML LIQUIFILM) 0.1 % Ophthalmic Drops, Suspension      . KRILL OIL ORAL Take by mouth     . LUTEIN ORAL Take by mouth     . melatonin 1 mg Oral Tablet As directed     . multivitamin (CENTRUM SILVER) 400-250 mcg Oral Tablet, Chewable Take 1 Tab by mouth Once a day Taking different brank with no vit K     . nirmatrelvir-ritonavir 300 mg (150 mg x 2)-100 mg Oral Tablet Take 3 tablets by mouth twice daily, take only 1/2 dose amlodipine while taking this medication 30 Tablet 0   . nitroGLYCERIN (NITROSTAT) 0.4 mg Sublingual Tablet, Sublingual 1 Tablet (0.4 mg total) by Sublingual route Every 5 minutes as needed for Chest pain for 3 doses over 15 minutes 25 Tablet 3   . terbinafine HCL (LAMISIL AT) 1 % Cream APPLY TO AFFECTED AREAS TWICE A DAY 2-4X A WEEK     . triamcinolone acetonide (ARISTOCORT A) 0.1 % Ointment APPLY TO AFFECTED AREA TWICE A DAY AS NEEDED     . UBIDECARENONE (COQ-10 ORAL) Once a day     .  warfarin (COUMADIN) 10 mg  Oral Tablet Daily or as directed 90 Tab 3   . Warfarin (COUMADIN) 4 mg Oral Tablet Take one tablet by mouth once daily or as directed. 90 Tablet 3     No current facility-administered medications for this visit.      Family Medical History:     Problem Relation (Age of Onset)    Emphysema Father    Heart Attack General Family Hx    Heart Disease Mother    Hypertension (High Blood Pressure) Mother    No Known Problems Other    Stroke Mother          Social History     Socioeconomic History   . Marital status: Married     Spouse name: Levada Dy   . Number of children: 1   . Years of education: Not on file   . Highest education level: Not on file   Occupational History   . Occupation: Other Land     Comment: Retired   Tobacco Use   . Smoking status: Never Smoker   . Smokeless tobacco: Never Used   Vaping Use   . Vaping Use: Never used   Substance and Sexual Activity   . Alcohol use: Not Currently     Comment: rare   . Drug use: Never   . Sexual activity: Yes     Partners: Female   Other Topics Concern   . Not on file   Social History Narrative   . Not on file     Social Determinants of Health     Financial Resource Strain: Not on file   Food Insecurity: Not on file   Transportation Needs: Not on file   Physical Activity: Not on file   Stress: Not on file   Intimate Partner Violence: Not on file   Housing Stability: Not on file        ROS: A 10 point review of systems is negative except for the HPI.    OBJECTIVE:   BP 118/74   Ht 1.905 m (6' 3" )   Wt 106 kg (234 lb)   BMI 29.25 kg/m        Body mass index is 29.25 kg/m.     Exam:    GENERAL: Alert and Oriented, No Apparent Distress  ABDOMEN: Soft, NT, Non-distended, No Masses  EXTERNAL GENITALIA: Both testicles appear normal with no masses, Penis has no lesions  ANORECTAL EXAM:  Prostate 2+ symmetric nontender no nodules  EXTREMITIES: No clubbing, cyanosis, or edema    Lab Results   Component Value Date/Time    COLOR Yellow 07/21/2021 02:49 PM     SPECGRAVUR 1.015 07/21/2021 02:49 PM    PHURINE 7.5 07/21/2021 02:49 PM    PROTEIN Not Detected 07/21/2021 02:49 PM    GLUCOSE Not Detected 07/21/2021 02:49 PM    KETONES Not Detected 07/21/2021 02:49 PM    UROBILINOGEN 0.2  07/21/2021 02:49 PM    LEUKOCYTES Not Detected 07/21/2021 02:49 PM    NITRITE Not Detected 07/21/2021 02:49 PM    WBC 4.7 06/10/2021 11:16 AM    RBC 4.99 06/10/2021 11:16 AM    BILIRUBIN Not Detected 07/21/2021 02:49 PM    HGBURINE Not Detected 07/21/2021 02:49 PM       Assessment   ASSESSMENT & PLAN:       ICD-10-CM    1. BPH with obstruction/lower urinary tract symptoms  N40.1 PSA, DIAGNOSTIC    N13.8  Return in about 1 year (around 07/21/2022).    I will see him back in 1 year with repeat PSA.  We did discuss repeating TUMT we also discussed REZUM therapy.           This note may have been fully or partially generated using MModal Fluency Direct system, and there may be some incorrect words, spellings, and punctuation that were not noted in checking the note before saving.    Graylon Gunning, MD  07/21/2021, 15:17

## 2021-07-29 ENCOUNTER — Other Ambulatory Visit: Payer: Self-pay

## 2021-08-02 ENCOUNTER — Other Ambulatory Visit (HOSPITAL_BASED_OUTPATIENT_CLINIC_OR_DEPARTMENT_OTHER): Payer: Self-pay

## 2021-08-02 ENCOUNTER — Encounter (HOSPITAL_BASED_OUTPATIENT_CLINIC_OR_DEPARTMENT_OTHER): Payer: Self-pay | Admitting: Family

## 2021-08-03 ENCOUNTER — Ambulatory Visit (INDEPENDENT_AMBULATORY_CARE_PROVIDER_SITE_OTHER): Payer: Medicare Other | Admitting: Nurse Practitioner

## 2021-08-03 ENCOUNTER — Encounter (HOSPITAL_BASED_OUTPATIENT_CLINIC_OR_DEPARTMENT_OTHER): Payer: Self-pay | Admitting: Nurse Practitioner

## 2021-08-03 ENCOUNTER — Other Ambulatory Visit: Payer: Self-pay

## 2021-08-03 VITALS — BP 110/78 | HR 70 | Ht 74.41 in | Wt 233.0 lb

## 2021-08-03 DIAGNOSIS — Z86718 Personal history of other venous thrombosis and embolism: Secondary | ICD-10-CM

## 2021-08-03 DIAGNOSIS — Z951 Presence of aortocoronary bypass graft: Secondary | ICD-10-CM

## 2021-08-03 DIAGNOSIS — I2510 Atherosclerotic heart disease of native coronary artery without angina pectoris: Secondary | ICD-10-CM

## 2021-08-03 DIAGNOSIS — I25110 Atherosclerotic heart disease of native coronary artery with unstable angina pectoris: Secondary | ICD-10-CM

## 2021-08-03 DIAGNOSIS — E782 Mixed hyperlipidemia: Secondary | ICD-10-CM

## 2021-08-03 DIAGNOSIS — I2699 Other pulmonary embolism without acute cor pulmonale: Secondary | ICD-10-CM

## 2021-08-03 DIAGNOSIS — I10 Essential (primary) hypertension: Secondary | ICD-10-CM

## 2021-08-03 DIAGNOSIS — I480 Paroxysmal atrial fibrillation: Secondary | ICD-10-CM

## 2021-08-03 LAB — POCT INR (RESULTS): POCT INR: 2.4

## 2021-08-03 MED ORDER — WARFARIN 4 MG TABLET
ORAL_TABLET | ORAL | 3 refills | Status: DC
Start: 2021-08-03 — End: 2023-05-03

## 2021-08-03 NOTE — Progress Notes (Signed)
[facility]  [address]  [place] [address]  [phone]    Date:   08/03/2021  Name: Dustin Webster  Age: 65 y.o.    REASON FOR VISIT:   Follow Up    HISTORY OF PRESENTING ILLNESS:    TREYLON Webster is a 65 y.o. male with history of coronary artery disease status post prior CABG, hypertension, hyperlipidemia, history of DVT/PE and paroxysmal AFib.  Patient was last seen here in the office in March at which time he was stable from a cardiovascular standpoint.  He returns today for follow-up exam.  He denies any current chest pain, shortness of breath, palpitations, syncope or near-syncope.  His INR today is therapeutic at 2.4.  He denies any bleeding complications.  His last LDL was 39 and his last hemoglobin A1c was 7.0.  His blood pressure is well controlled on today's exam.  He indicated that his entire family was in a contest for weight loss by putting 25 dollars in a jar and biggest looser wins the money.  Additionally if weight is gained on a weekly basis they have to add more money.  He indicated at this time he has lost the most weight since they started.      Current Outpatient Medications   Medication Sig    allopurinoL (ZYLOPRIM) 100 mg Oral Tablet TAKE 1 TABLET DAILY FOR PREVENTION OF CALCIUM-CONTAINING KIDNEY STONES    amLODIPine (NORVASC) 10 mg Oral Tablet Take 1 Tablet (10 mg total) by mouth Once a day    ascorbic acid, vitamin C, (VITAMIN C) 500 mg Oral Tablet 500 mg Once a day     aspirin (ECOTRIN) 81 mg Oral Tablet, Delayed Release (E.C.) Once a day    azelastine (OPTIVAR) 0.05 % Ophthalmic Drops     Calcium Carbonate (OS-CAL) 650 mg calcium (1,625 mg) Oral Tablet Every evening     Cholecalciferol, Vitamin D3, 50 mcg (2,000 unit) Oral Capsule Take by mouth    cholestyramine-sucrose (QUESTRAN) 4 gram Oral Powder in Packet Take 1 Packet by mouth Once a day    citalopram (CELEXA) 10 mg Oral Tablet Take 1 Tablet (10 mg total) by mouth Once a day     Cyanocobalamin-Cobamamide (B12) 5,000-100 mcg Sublingual Lozenge Place under the tongue    cycloSPORINE (RESTASIS) 0.05 % Ophthalmic Dropperette     evolocumab (REPATHA SURECLICK) 956 mg/mL Subcutaneous Pen Injector Inject one pen (140 mg) under the skin Every 30 days    famotidine (PEPCID) 40 mg Oral Tablet Take 1 Tablet (40 mg total) by mouth Twice daily    fenofibrate nanocrystallized (TRICOR) 145 mg Oral Tablet Take 1 Tablet (145 mg total) by mouth Every morning with breakfast    ferrous sulfate (FERATAB) 324 mg (65 mg iron) Oral Tablet, Delayed Release (E.C.) Take 1 Tablet (324 mg total) by mouth Once a day    fluorometholone (FML LIQUIFILM) 0.1 % Ophthalmic Drops, Suspension  (Patient not taking: Reported on 08/03/2021)    KRILL OIL ORAL Take by mouth    LUTEIN ORAL Take by mouth    melatonin 1 mg Oral Tablet As directed    multivitamin (CENTRUM SILVER) 400-250 mcg Oral Tablet, Chewable Take 1 Tab by mouth Once a day Taking different brank with no vit K    nirmatrelvir-ritonavir 300 mg (150 mg x 2)-100 mg Oral Tablet Take 3 tablets by mouth twice daily, take only 1/2 dose amlodipine while taking this medication    nitroGLYCERIN (NITROSTAT) 0.4 mg Sublingual  Tablet, Sublingual 1 Tablet (0.4 mg total) by Sublingual route Every 5 minutes as needed for Chest pain for 3 doses over 15 minutes    terbinafine HCL (LAMISIL AT) 1 % Cream APPLY TO AFFECTED AREAS TWICE A DAY 2-4X A WEEK    triamcinolone acetonide (ARISTOCORT A) 0.1 % Ointment APPLY TO AFFECTED AREA TWICE A DAY AS NEEDED    UBIDECARENONE (COQ-10 ORAL) Once a day    warfarin (COUMADIN) 10 mg Oral Tablet Daily or as directed    Warfarin (COUMADIN) 4 mg Oral Tablet Take one tablet by mouth once daily or as directed.     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    Pericardial effusion     Pulmonary emboli (CMS HCC)          Past Surgical History:   Procedure Laterality Date    HX CHOLECYSTECTOMY      HX CORONARY ARTERY BYPASS GRAFT      HX HEART SURGERY  12/2015    HX HERNIA REPAIR      Inguinal     HX LIPOMA RESECTION      Chest    OTHER SURGICAL HISTORY Left 01/2016    Evacuation of hematoma -Left Atrium  at [facility]     SKIN CANCER EXCISION  06/21/2013    melanoma/ear/[facility]    TRANSURETHRAL MICROWAVE THERAPY  05/24/2018    Dr Leandro Reasoner         Family Medical History:       Problem Relation (Age of Onset)    Emphysema Father    Heart Attack General Family Hx    Heart Disease Mother    Hypertension (High Blood Pressure) Mother    No Known Problems Other    Stroke Mother            Social History     Socioeconomic History    Marital status: Married     Spouse name: Levada Dy    Number of children: 1   Occupational History    Occupation: Other Land     Comment: Retired   Tobacco Use    Smoking status: Never Smoker    Smokeless tobacco: Never Used   Brewing technologist Use: Never used   Substance and Sexual Activity    Alcohol use: Not Currently     Comment: rare    Drug use: Never    Sexual activity: Yes     Partners: Female       REVIEW OF SYSTEMS:  Constitutional: No fevers, chills, malaise or abnormal weight loss.    HENT: No tinnitus, vertigo, hearing loss, ear pain, sinus drainage, nasal congestion or throat pain.  Eyes: No vision changes, diplopia, drainage, pain  Respiratory: No cough, shortness of breath, wheezing  Cardiovascular: No chest pain, PND, orthopnea, palpitations, lower extremity edema.  Gastrointestinal: No abdominal pain, nausea, vomiting,  diarrhea, constipation, melana or hematochezia   Endocrine: No polyuria, polydipsia, heat or cold intolerance.  Genitourinary: No dysuria, urgency, frequency, hematuria, nocturia.  Musculoskeletal: No myalgias, arthralgias.  Skin: No rashes, suspicious lesions.  Neurological: No headaches, weakness, paresthesias  Hematological:  No bleeding, spontaneous bruising.  Psychiatric/Behavioral: No confusion, agitation, hallucinations, paranoia, delusions.    All other systems reviewed and are negative except as noted in [facility].    PHYSICAL EXAMINATION:     Vitals reviewed. BP 110/78 Comment: RTA   Pulse 70    Ht 1.89 m (6' 2.41")    Wt 106 kg (233 lb)    SpO2 98%    BMI 29.59 kg/m       Vitals Filed         08/03/2021  1339 08/03/2021  1340          BP: 112/70 110/78      Pulse: 70 --              Constitutional: He is oriented to person, place, and time.  He appears well-developed and well-nourished.   HEENT:  Normocephalic, atraumatic.  Nose clear with drainage.  Normal appearing nasal mucosa.  Mucous membranes moist without lesions.   Neck: Supple. Trachea midline.  Cardiovascular: Normal rate and rhythm without murmurs, rubs or gallops.  No JVD.  No bruits.  No peripheral edema is noted.  Pulmonary/Chest: Lungs clear without wheezes, rales or rhonchi.  No chest wall tenderness or deformity.   Abdominal: Soft. Nontender, nondistended. Rectal exam deferred.  Musculoskeletal: Full and painless ROM in all joint groups.  Neurological: Alert and oriented x 4.  BUE's /BLE's.  Strength and sensation intact in all extremeties.   Skin: Skin is warm and dry. No rash or suspicious skin lesions noted.  Psychiatric:  Normal mood and affect. Speech is normal and behavior is normal. Judgment and thought content normal. Cognition and memory are normal.  Vascular: Peripheral lower extremity pulses well felt.    ASSESSMENT:    ENCOUNTER DIAGNOSES     ICD-10-CM   1. Coronary artery disease involving native coronary artery of native heart  with unstable angina pectoris (CMS HCC)  I25.110   2. Hx of CABG  Z95.1   3. History of DVT of lower extremity  Z86.718   4. Pulmonary embolism, unspecified chronicity, unspecified pulmonary embolism type, unspecified whether acute cor pulmonale present (CMS HCC)  I26.99   5. Primary hypertension  I10   6. Mixed hyperlipidemia  E78.2   7. Paroxysmal atrial fibrillation (CMS HCC)  I48.0         PLAN:  Mr. Sia is a 65 year old male with history of CAD prior CABG, PE/DVT, hypertension, hyperlipidemia and paroxysmal AFib.    CAD-status post prior CABG.  No current anginal symptoms.  Continue current medications risk factor modification    Hyperlipidemia-goal LDL is 70. Last LDL was 39 on Repatha.  He is only taking injections once a month due to extremely low LDL of 11 previously.    Hypertension-well controlled.  Goal blood pressure less than 130/80    History of DVT/PE-continue Coumadin.    Paroxysmal AFib-no reported palpitations.  Clinically in sinus rhythm on today's exam.  Continue Coumadin report any signs or symptoms of bleeding to the office.    Patient encouraged healthy lifestyle with diet, exercise and weight loss.  Currently in a contest with his family for weight loss as noted above.    No orders  of the defined types were placed in this encounter.      Return in about 6 months (around 01/31/2022) for [facility].    Electronically signed by     Cristal Deer, APRN  08/03/2021, 13:46  Note reviewed and I agree with the above findings and assessment and plan by the nurse practitioner.

## 2021-08-03 NOTE — Patient Instructions (Signed)
Goal BP < 130/80    Goal LDL < 70    Goal HGB AIC < 7.0        Report any s/s of bleeding

## 2021-08-16 ENCOUNTER — Other Ambulatory Visit: Payer: Self-pay

## 2021-08-17 ENCOUNTER — Other Ambulatory Visit (INDEPENDENT_AMBULATORY_CARE_PROVIDER_SITE_OTHER): Payer: Self-pay | Admitting: Pharmacist

## 2021-08-17 ENCOUNTER — Other Ambulatory Visit: Payer: Self-pay

## 2021-08-17 NOTE — Telephone Encounter (Signed)
Specialty Pharmacy Follow-up Assessment Note    Date of assessment: 08/17/2021  Patient: Dustin Webster is a 65 y.o. male  Diagnosis: hyperlipidemia w/ clinical ASCVD  Specialty Medication(s): Repatha 629 mg Sureclick pen SC every 30 days  Prescriber: Cristal Deer, APRN  Initiation of therapy: 08/10/2020 (dose lowered to monthly June 2022)    Subjective:  Spoke with patient today for annual follow-up regarding Repatha. Continues to take on the 20th of every month (he requested reduced frequency due to high cost); reports 1 missed dose and using paper calendar as a method of adherence. Regarding adherence patient took last dose 9/20 and has zero pens left on hand. Reports the following side effects: none.    He reports overall quality of life is no different since last assessment. Denies missed days from planned activities due to disease. Denies recent hospitalizations/ER/urgent care visits in the past month related to diagnosis.      Patient declined to update medication list today stating no changes since last done w/ Cardiology last month.    Objective:  Pertinent labs include:   Lab Results   Component Value Date    CHOLESTEROL 129 06/10/2021    HDLCHOL 41 06/10/2021    LDLCHOL 39 06/10/2021    LDLCHOLDIR 91 05/07/2018    TRIG 245 (H) 06/10/2021      Assessment:  Reviewed medications, conditions, and allergies with no known contraindications for continuation in therapy. No significant drug-drug interactions expected. Reported medication on hand does not match fill records and demonstrates non-adherence. Patient is back on track injecting on the 20th of each month and using a paper calendar as adherence aid.    Patient is maintaining therapeutic goal of LDL < 70.    Plan:   Continue therapy: therapy appropriate. Patient declined review of administration process, storage, missed dose handling, and side effects today.     Scheduled delivery of Repatha for Mon 10/17; refill due 10/20. Scheduled pharmacist follow-up for  1 year. Patient expressed no additional questions and was encouraged to contact [facility] with any questions or concerns.     [employer], PHARMD  08/17/2021, 11:20

## 2021-08-18 ENCOUNTER — Ambulatory Visit: Payer: Medicare Other | Attending: Family | Admitting: Family

## 2021-08-18 ENCOUNTER — Other Ambulatory Visit (HOSPITAL_BASED_OUTPATIENT_CLINIC_OR_DEPARTMENT_OTHER): Payer: Self-pay | Admitting: Family

## 2021-08-18 ENCOUNTER — Encounter (INDEPENDENT_AMBULATORY_CARE_PROVIDER_SITE_OTHER): Payer: Self-pay | Admitting: Family

## 2021-08-18 ENCOUNTER — Ambulatory Visit (HOSPITAL_BASED_OUTPATIENT_CLINIC_OR_DEPARTMENT_OTHER): Payer: Self-pay

## 2021-08-18 ENCOUNTER — Other Ambulatory Visit: Payer: Self-pay

## 2021-08-18 ENCOUNTER — Telehealth (HOSPITAL_BASED_OUTPATIENT_CLINIC_OR_DEPARTMENT_OTHER): Payer: Self-pay | Admitting: Family

## 2021-08-18 ENCOUNTER — Inpatient Hospital Stay (HOSPITAL_BASED_OUTPATIENT_CLINIC_OR_DEPARTMENT_OTHER)
Admission: RE | Admit: 2021-08-18 | Discharge: 2021-08-18 | Disposition: A | Discharge status: Home / Self Care | Payer: Medicare Other | Source: Ambulatory Visit

## 2021-08-18 VITALS — BP 122/73 | HR 67 | Temp 96.9°F | Ht 75.0 in | Wt 231.0 lb

## 2021-08-18 DIAGNOSIS — D649 Anemia, unspecified: Secondary | ICD-10-CM | POA: Insufficient documentation

## 2021-08-18 LAB — CBC WITH DIFF
BASOPHIL #: <0.1 10*3/uL (ref <=0.20)
BASOPHIL %: 1 %
EOSINOPHIL #: 0.26 10*3/uL (ref <=0.50)
EOSINOPHIL %: 7 %
HCT: 39.4 % (ref 38.9–52.0)
HGB: 12.8 g/dL — ABNORMAL LOW (ref 13.4–17.5)
IMMATURE GRANULOCYTE #: <0.1 10*3/uL (ref <0.10)
IMMATURE GRANULOCYTE %: 1 % (ref 0–1)
LYMPHOCYTE #: 1.48 10*3/uL (ref 1.00–4.80)
LYMPHOCYTE %: 37 %
MCH: 28.8 pg (ref 26.0–32.0)
MCHC: 32.5 g/dL (ref 31.0–35.5)
MCV: 88.7 fL (ref 78.0–100.0)
MONOCYTE #: 0.33 10*3/uL (ref 0.20–1.10)
MONOCYTE %: 8 %
MPV: 11.8 fL (ref 8.7–12.5)
NEUTROPHIL #: 1.9 10*3/uL (ref 1.50–7.70)
NEUTROPHIL %: 46 %
PLATELETS: 184 10*3/uL (ref 150–400)
RBC: 4.44 10*6/uL — ABNORMAL LOW (ref 4.50–6.10)
RDW-CV: 14.7 % (ref 11.5–15.5)
WBC: 4 10*3/uL (ref 3.7–11.0)

## 2021-08-18 LAB — FERRITIN: FERRITIN: 64 ng/mL (ref 20–300)

## 2021-08-18 LAB — IRON TRANSFERRIN AND TIBC
IRON (TRANSFERRIN) SATURATION: 17 % (ref 15–50)
IRON: 74 ug/dL (ref 55–175)
TOTAL IRON BINDING CAPACITY: 444 ug/dL (ref 224–476)
TRANSFERRIN: 317 mg/dL (ref 160–340)

## 2021-08-18 LAB — RETICULOCYTE COUNT
IMMATURE RETIC FRACTION: 12.6 % (ref 2.7–15.9)
RETICULOCYTE % AUTOMATED: 2.2 % (ref 0.50–2.20)
RETICULOCYTE HEMOGLOBIN EQUIVALENT: 33.6 pg (ref 28.0–38.0)
RETICULOCYTES COUNT # AUTOMATED: 97.7 10*3/uL (ref 26.0–110.0)

## 2021-08-18 NOTE — Telephone Encounter (Signed)
Telephoned lab results, ferritin improve 64, previously 51. Remainder iron studies within normal limits. No additional recommendations at this time and keep follow up appt as scheduled. States understanding. Frances Nickels, RN  08/18/2021, 10:18

## 2021-08-18 NOTE — Cancer Center Note (Signed)
[facility]  [address]   [place], [place] [address]   [phone]       Name:Dustin Webster  Date of Birth: Jan 17, 1956  MRN: [id number]  Date of Service:  08/18/2021       Dustin Webster is a 65 y.o. who presents to the [facility] at [facility] for evaluation and opinion regarding anemia The referring physician No referring provider defined for this encounter. and I will discuss my recommendations and findings by way of shared medical records, telephone converation or a letter via [place] mail.       Hematology/Oncology History and HPI    Patient was referred by PCP for evaluation of anemia.   Patient states he has been diagnosed with anemia over 15 years ago and has been taking oral iron since initial diagnosis, he has not required iron infusions.  He states recently he developed shortness of breath, fatigue and chest pain.  Patient reports history of AFib and underwent CABG 4 years ago.  Patient is on Ecotrin and Coumadin for management of atrial fib which is managed by Cardiology.  Patient follows closely with Gastroenterology for medical management of celiac disease, last EGD 07/21/2020 per Dr. Stanford Scotland.    Recent lab workup reviewed:  -Records from 09/19/2016 through 03/29/2021 shows hemoglobin ranging from 15.9 to most recently 8.5 on 03/29/2021.  -03/29/2021:  Ferritin 32, iron saturation 11 TIBC 499.     In the interval, patient reports he is doing well.  He states that he did have COVID infection in August and did experience fatigue but reports symptoms have resolved.  Patient was recently seen by Cardiology and Urology for routine follow-ups.  Patient has upcoming visit with Gastroenterology on 08/23/2021 for surveillance celiac disease.  Patient denies any complaints of increased fatigue, shortness of breath, PICA or abnormal bleeding.  Currently taking 2 iron tablets daily which he is tolerating well and denies any adverse effects.  Patient reports he  has been watching diet and has decreased red meat which is improved his GI symptoms.  He denies any other issues or concerns at this time    White blood cell count 4.0, hemoglobin 12.8, platelets 184.  Absolute reticulocyte count 97.7, 2.2% normal differential.  Ferritin, transferrin and TIBC pending.      Past Medical History    Past Medical History:   Diagnosis Date   . Anemia    . Atrial fibrillation (CMS HCC)    . BPH associated with nocturia    . CAD (coronary artery disease)    . Chronic depression 05/23/2018   . CKD (chronic kidney disease)    . COVID-19 08/03/2020   . Diet-controlled diabetes mellitus (CMS [place]) 05/23/2018   . DVT (deep venous thrombosis) (CMS HCC)    . Gastroesophageal reflux disease without esophagitis 05/23/2018   . Insomnia 05/23/2018   . 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 Pericardial effusion    . Pulmonary emboli (CMS [facility])            Past Surgical History:   Procedure Laterality Date   . HX CHOLECYSTECTOMY     . HX CORONARY ARTERY BYPASS GRAFT     . HX HEART SURGERY  12/2015   . HX HERNIA REPAIR      Inguinal    . HX LIPOMA RESECTION      Chest   . OTHER SURGICAL HISTORY Left 01/2016    Evacuation  of hematoma -Left Atrium  at [facility]    . SKIN CANCER EXCISION  06/21/2013    melanoma/ear/[facility]   . TRANSURETHRAL MICROWAVE THERAPY  05/24/2018    Dr Leandro Reasoner           Current Outpatient Medications   Medication Sig Dispense Refill   . allopurinoL (ZYLOPRIM) 100 mg Oral Tablet TAKE 1 TABLET DAILY FOR PREVENTION OF CALCIUM-CONTAINING KIDNEY STONES 90 Tablet 1   . amLODIPine (NORVASC) 10 mg Oral Tablet Take 1 Tablet (10 mg total) by mouth Once a day 90 Tablet 1   . ascorbic acid, vitamin C, (VITAMIN C) 500 mg Oral Tablet 500 mg Once a day      . aspirin (ECOTRIN) 81 mg Oral Tablet, Delayed Release (E.C.) Once a day     . azelastine (OPTIVAR) 0.05 % Ophthalmic Drops      . Calcium Carbonate (OS-CAL) 650 mg calcium (1,625 mg) Oral Tablet Every evening      . Cholecalciferol, Vitamin D3,  50 mcg (2,000 unit) Oral Capsule Take by mouth     . cholestyramine-sucrose (QUESTRAN) 4 gram Oral Powder in Packet Take 1 Packet by mouth Once a day 90 Packet 1   . citalopram (CELEXA) 10 mg Oral Tablet Take 1 Tablet (10 mg total) by mouth Once a day 90 Tablet 1   . Cyanocobalamin-Cobamamide (B12) 5,000-100 mcg Sublingual Lozenge Place under the tongue     . cycloSPORINE (RESTASIS) 0.05 % Ophthalmic Dropperette      . evolocumab (REPATHA SURECLICK) 270 mg/mL Subcutaneous Pen Injector Inject one pen (140 mg) under the skin Every 30 days 4 mL 5   . famotidine (PEPCID) 40 mg Oral Tablet Take 1 Tablet (40 mg total) by mouth Twice daily 180 Tablet 1   . fenofibrate nanocrystallized (TRICOR) 145 mg Oral Tablet Take 1 Tablet (145 mg total) by mouth Every morning with breakfast 90 Tablet 1   . ferrous sulfate (FERATAB) 324 mg (65 mg iron) Oral Tablet, Delayed Release (E.C.) Take 1 Tablet (324 mg total) by mouth Once a day 1 Tablet 0   . fluorometholone (FML LIQUIFILM) 0.1 % Ophthalmic Drops, Suspension  (Patient not taking: No sig reported)     . KRILL OIL ORAL Take by mouth     . LUTEIN ORAL Take by mouth     . melatonin 1 mg Oral Tablet As directed     . multivitamin (CENTRUM SILVER) 400-250 mcg Oral Tablet, Chewable Take 1 Tab by mouth Once a day Taking different brank with no vit K     . nirmatrelvir-ritonavir 300 mg (150 mg x 2)-100 mg Oral Tablet Take 3 tablets by mouth twice daily, take only 1/2 dose amlodipine while taking this medication 30 Tablet 0   . nitroGLYCERIN (NITROSTAT) 0.4 mg Sublingual Tablet, Sublingual 1 Tablet (0.4 mg total) by Sublingual route Every 5 minutes as needed for Chest pain for 3 doses over 15 minutes 25 Tablet 3   . terbinafine HCL (LAMISIL AT) 1 % Cream APPLY TO AFFECTED AREAS TWICE A DAY 2-4X A WEEK     . triamcinolone acetonide (ARISTOCORT A) 0.1 % Ointment APPLY TO AFFECTED AREA TWICE A DAY AS NEEDED     . UBIDECARENONE (COQ-10 ORAL) Once a day     . warfarin (COUMADIN) 10 mg Oral  Tablet Daily or as directed 90 Tab 3   . Warfarin (COUMADIN) 4 mg Oral Tablet Take one tablet by mouth once daily or as directed. 90 Tablet 3  No current facility-administered medications for this visit.       Allergies   Allergen Reactions   . Gluten      Celiac disease       Social History     Tobacco Use   . Smoking status: Never Smoker   . Smokeless tobacco: Never Used   Vaping Use   . Vaping Use: Never used   Substance Use Topics   . Alcohol use: Not Currently     Comment: rare   . Drug use: Never        Family Medical History:     Problem Relation (Age of Onset)    Emphysema Father    Heart Attack General Family Hx    Heart Disease Mother    Hypertension (High Blood Pressure) Mother    No Known Problems Other    Stroke Mother            Review of Systems  General: No fever, chills, sweats, night sweats or weight loss.  HEENT: Negative for frequent or significant headaches, No changes in hearing or vision, no nose bleeds or other nasal problems, No odynophagia or dysphagia  NECK: Negative for lumps, goiter, pain and significant new swelling  RESPIRATORY: Negative for cough, wheezing or shortness of breath. No hemoptysis  CARDIOVASCULAR: Negative for chest pain, leg swelling or palpitations.  GASTROINTESTINAL: Negative for abdominal discomfort, blood in stools or black stools or change in bowel habits.  GENITOURINARY: No history of dysuria, frequency or incontinence  GYN: Negative for abnormal vaginal bleeding, abnormal vaginal discharge  MUSCULOSKELETAL: Negative for joint pain or swelling, back pain or muscle pain.  NEUROLOGIC: Negative for focal numbness or weakness, headaches and dizziness or syncope  SKIN: Negative for lesion, rash, and itching  PSYCHIATRIC: Negative for sleep disturbance, mood disorder and recent psychosocial stressors  HEMATOLOGIC/LYMPHATIC/IMMUNOLOGIC: No bruising or bleeding, no petechiae, no adenopathy  ENDOCRINE: Negative for cold or heat intolerance, polyuria, polydipsia and  goiter.  The remainder of the ROS was negative.    Physical Examination:    Blood pressure 122/73, pulse 67, temperature 36.1 C (96.9 F), temperature source Thermal Scan, height 1.905 m (_0 ), weight 105 kg (231 lb), SpO2 97 %.      Performance Status: 0  General appearance: well appearing, alert, in no acute distress.  Skin: skin color, texture, tugor normal, no rashes or lesions  Head: nomocephalic, no masses, lesions, tendernesss or abdomalities  Eyes: Anicteric sclera. Pupils are equeally round and reactive to light. Extraocular movements are intact  Ears: external ears normal  Oropharynx: Oropharynx clear, no erthema, no thrush.  Neck: Supple,thyroid not palpable  Lymphatic: no palpable lymphadenopathy  CVS: S1 and S2, no audible murmur/ rub  Chest: Clear to auscultation bilaterally  Abdomen: Soft, non tender, non distended, no hepatosplenomegaly. Bowel sounds audible  Gynecology and Rectal exam: Deferred  PQZ:RAQTM, oriented and grossly intact    LABS  Results for orders placed or performed during the hospital encounter of 08/18/21 (from the past 72 hour(s))   CBC/DIFF    Narrative    The following orders were created for panel order CBC/DIFF.  Procedure                               Abnormality         Status                     ---------                               -----------         ------  CBC WITH FIEP[[id number]]                Abnormal            Final result                 Please view results for these tests on the individual orders.   RETICULOCYTE COUNT   Result Value Ref Range    RETICULOCYTE % AUTOMATED 2.20 0.50 - 2.20 %    RETICULOCYTES COUNT # AUTOMATED 97.7 26.0 - 110.0 x10^3/uL    IMMATURE RETIC FRACTION 12.6 2.7 - 15.9 %    RETICULOCYTE HEMOGLOBIN EQUIVALENT 33.6 28.0 - 38.0 pg    Narrative    Both reticulocyte hemoglobin content(RET-He) and immature reticulocyte fraction (IRF) quantify circulating immature red blood cells (RBC). They are used to evaluate anemic  patientswho may have insufficient marrow production (lowerRET-He & IRF) or rapid peripheral destruction of RBC (higher RET-He &IRF).     CBC WITH DIFF   Result Value Ref Range    WBC 4.0 3.7 - 11.0 x10^3/uL    RBC 4.44 (L) 4.50 - 6.10 x10^6/uL    HGB 12.8 (L) 13.4 - 17.5 g/dL    HCT 39.4 38.9 - 52.0 %    MCV 88.7 78.0 - 100.0 fL    MCH 28.8 26.0 - 32.0 pg    MCHC 32.5 31.0 - 35.5 g/dL    RDW-CV 14.7 11.5 - 15.5 %    PLATELETS 184 150 - 400 x10^3/uL    MPV 11.8 8.7 - 12.5 fL    NEUTROPHIL % 46 %    LYMPHOCYTE % 37 %    MONOCYTE % 8 %    EOSINOPHIL % 7 %    BASOPHIL % 1 %    NEUTROPHIL # 1.90 1.50 - 7.70 x10^3/uL    LYMPHOCYTE # 1.48 1.00 - 4.80 x10^3/uL    MONOCYTE # 0.33 0.20 - 1.10 x10^3/uL    EOSINOPHIL # 0.26 <=0.50 x10^3/uL    BASOPHIL # <0.10 <=0.20 x10^3/uL    IMMATURE GRANULOCYTE % 1 0 - 1 %    IMMATURE GRANULOCYTE # <0.10 <0.10 x10^3/uL         Radiology  No results were found from the past 30 days.    Last Mammogram (if applicable): No results found for this or any previous visit (from the past 17520 hour(s)).    Last PetScan (if applicable): No results found for this or any previous visit (from the past 17520 hour(s)).    Recent Pathology  No results found for this or any previous visit (from the past 720 hour(s)).    Assessment and Plan  Normocytic anemia    Pathophysiology of anemia was discussed with the possibility of underlying decreased production increased destruction or anemia secondary to underlying chronic conditions or underlying chronic kidney disease.  Also discussed that with history of Crohn's disease and anticoagulant therapy he is at increased risk for malabsorption or GI bleeding.    Lab results reviewed and discussed with patient.  Hemoglobin has decreased slightly to 12.8, patient has reported decreasing intake of red meat since last visit.  Ferritin and transferrin pending, patient will be notified of results and recommendations once reviewed.  Patient is to continue oral iron tablets  twice a day.  Discussed with patient's since he is asymptomatic and counts are stable can extend follow-up visit to every 6 months.  Advised patient that if ferritin level has dropped will follow-up in 3 months.  Patient verbalizes  understanding and is in agreement with this plan.    Return visit 02/15/2019    Carolee Rota, APRN, FNP-BC     This note has been dictated by Carolee Rota, APRN, FNP-BC.  This patient has been seen independently by Carolee Rota, APRN FNP-BC with a physician in clinic and who was available for consultation.

## 2021-08-19 ENCOUNTER — Other Ambulatory Visit: Payer: Self-pay

## 2021-08-23 ENCOUNTER — Other Ambulatory Visit: Payer: Self-pay

## 2021-08-23 ENCOUNTER — Ambulatory Visit: Payer: Medicare Other | Attending: Family | Admitting: Family

## 2021-08-23 ENCOUNTER — Encounter (HOSPITAL_BASED_OUTPATIENT_CLINIC_OR_DEPARTMENT_OTHER): Payer: Self-pay | Admitting: Family

## 2021-08-23 VITALS — BP 130/76 | Ht 75.0 in | Wt 233.0 lb

## 2021-08-23 DIAGNOSIS — K900 Celiac disease: Secondary | ICD-10-CM | POA: Insufficient documentation

## 2021-08-23 DIAGNOSIS — K580 Irritable bowel syndrome with diarrhea: Secondary | ICD-10-CM | POA: Insufficient documentation

## 2021-08-23 DIAGNOSIS — K219 Gastro-esophageal reflux disease without esophagitis: Secondary | ICD-10-CM | POA: Insufficient documentation

## 2021-08-23 MED ORDER — CHOLESTYRAMINE (WITH SUGAR) 4 GRAM POWDER FOR SUSP IN A PACKET
1.0000 | Freq: Every day | ORAL | 1 refills | Status: DC
Start: 2021-08-23 — End: 2023-05-03

## 2021-08-23 NOTE — H&P (Signed)
GASTROENTEROLOGY, [place]  [place] [id number]  [phone]    Progress Note  Name: Dustin Webster  MRN: [id number]  DOB: [birth year]  Age: 65 y.o.  Date: 08/23/2021    Reason for Visit: Follow Up (Anemia, change in bowel habits/)      History of Present Illness:  Dustin Webster is a 65 y.o. male who presents today for change in bowel, GERD.    States that he has changed his diet and this has significantly helped his bowel habits and GERD.     Moving bowels twice a day. Formed stool. Will have an episode of diarrhea every 1-2 weeks. Taking cholestyramine 1 packet daily. Denies hematochezia. States stool is always black due to iron supplementation. Iron and Hgb levels normal. Denies abdominal pain.    Denies nausea and vomiting. States he will have heartburn and reflux almost daily if he drinks too much coffee. States that if he decreases his coffee intake, he does not typically have problems. Taking Pepcid 40mg  BID. States he was taken off of PPI by nephrology. Occasionally using TUMs. Denies dysphagia. Endorses 100% gluten free diet.    Prior Testing:     Labs 08/18/2021   Ferritin - 64   H/H - 12.8/39.4      Labs 06/10/2021   Ferritin - 51   H/H - 13.9/45.1    EGD 07/17/2020  . Grade B reflux esophagitis  . Schatzki ring - 30F dilation  . Small sliding hiatal hernia    Colonoscopy 07/17/2020  . Subtle chronic colitis - nonspecific chronic inflammation  . Moderate diverticulosis of the sigmoid  . Small internal hemorrhoids    08/27/2019  . H/H 13.8/42.6  . MCV 87.1  . MCH 28.2  . Ferritin 37    EGD 09/2012  . Grade B esophagitis  . Duodenum biopsy - flattened villi with chronic inflammation and congestion favoring celiac    Colonoscopy 09/2012  . Normal    Patient History:  Past Medical History:   Diagnosis Date   . Anemia    . Atrial fibrillation (CMS HCC)    . BPH associated with nocturia    . CAD (coronary artery disease)    . Chronic depression 05/23/2018   . CKD  (chronic kidney disease)    . COVID-19 08/03/2020   . Diet-controlled diabetes mellitus (CMS [place]) 05/23/2018   . DVT (deep venous thrombosis) (CMS HCC)    . Gastroesophageal reflux disease without esophagitis 05/23/2018   . Insomnia 05/23/2018   . 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 Pericardial effusion    . Pulmonary emboli (CMS [facility])      Past Surgical History:   Procedure Laterality Date   . HX CHOLECYSTECTOMY     . HX CORONARY ARTERY BYPASS GRAFT     . HX HEART SURGERY  12/2015   . HX HERNIA REPAIR      Inguinal    . HX LIPOMA RESECTION      Chest   . OTHER SURGICAL HISTORY Left 01/2016    Evacuation of hematoma -Left Atrium  at [facility]    . SKIN CANCER EXCISION  06/21/2013    melanoma/ear/[facility]   . TRANSURETHRAL MICROWAVE THERAPY  05/24/2018    Dr Leandro Reasoner     Current Outpatient Medications   Medication Sig   . allopurinoL (ZYLOPRIM) 100 mg Oral Tablet TAKE 1 TABLET DAILY FOR PREVENTION OF CALCIUM-CONTAINING KIDNEY STONES   .  amLODIPine (NORVASC) 10 mg Oral Tablet Take 1 Tablet (10 mg total) by mouth Once a day   . ascorbic acid, vitamin C, (VITAMIN C) 500 mg Oral Tablet 500 mg Once a day    . aspirin (ECOTRIN) 81 mg Oral Tablet, Delayed Release (E.C.) Once a day   . azelastine (OPTIVAR) 0.05 % Ophthalmic Drops    . Calcium Carbonate (OS-CAL) 650 mg calcium (1,625 mg) Oral Tablet Every evening    . Cholecalciferol, Vitamin D3, 50 mcg (2,000 unit) Oral Capsule Take by mouth   . cholestyramine-sucrose (QUESTRAN) 4 gram Oral Powder in Packet Take 1 Packet by mouth Once a day   . citalopram (CELEXA) 10 mg Oral Tablet Take 1 Tablet (10 mg total) by mouth Once a day   . Cyanocobalamin-Cobamamide (B12) 5,000-100 mcg Sublingual Lozenge Place under the tongue   . evolocumab (REPATHA SURECLICK) 701 mg/mL Subcutaneous Pen Injector Inject one pen (140 mg) under the skin Every 30 days   . famotidine (PEPCID) 40 mg Oral Tablet Take 1 Tablet (40 mg total) by mouth Twice daily   . fenofibrate nanocrystallized (TRICOR)  145 mg Oral Tablet Take 1 Tablet (145 mg total) by mouth Every morning with breakfast   . ferrous sulfate (FERATAB) 324 mg (65 mg iron) Oral Tablet, Delayed Release (E.C.) Take 1 Tablet (324 mg total) by mouth Once a day   . fluorometholone (FML LIQUIFILM) 0.1 % Ophthalmic Drops, Suspension  (Patient not taking: No sig reported)   . KRILL OIL ORAL Take by mouth   . LUTEIN ORAL Take by mouth   . melatonin 1 mg Oral Tablet As directed   . nitroGLYCERIN (NITROSTAT) 0.4 mg Sublingual Tablet, Sublingual 1 Tablet (0.4 mg total) by Sublingual route Every 5 minutes as needed for Chest pain for 3 doses over 15 minutes   . terbinafine HCL (LAMISIL AT) 1 % Cream APPLY TO AFFECTED AREAS TWICE A DAY 2-4X A WEEK   . triamcinolone acetonide (ARISTOCORT A) 0.1 % Ointment APPLY TO AFFECTED AREA TWICE A DAY AS NEEDED   . UBIDECARENONE (COQ-10 ORAL) Once a day   . warfarin (COUMADIN) 10 mg Oral Tablet Daily or as directed   . Warfarin (COUMADIN) 4 mg Oral Tablet Take one tablet by mouth once daily or as directed.     Allergies   Allergen Reactions   . Gluten      Celiac disease     Family Medical History:     Problem Relation (Age of Onset)    Emphysema Father    Heart Attack General Family Hx    Heart Disease Mother    Hypertension (High Blood Pressure) Mother    No Known Problems Other    Stroke Mother        Social History     Socioeconomic History   . Marital status: Married     Spouse name: Levada Dy   . Number of children: 1   . Years of education: Not on file   . Highest education level: Not on file   Occupational History   . Occupation: Other Land     Comment: Retired   Tobacco Use   . Smoking status: Never   . Smokeless tobacco: Never   Vaping Use   . Vaping Use: Never used   Substance and Sexual Activity   . Alcohol use: Not Currently     Comment: rare   . Drug use: Never   . Sexual activity: Yes     Partners:  Female   Other Topics Concern   . Not on file   Social History Narrative   . Not on file     Social  Determinants of Health     Financial Resource Strain: Not on file   Food Insecurity: Not on file   Transportation Needs: Not on file   Physical Activity: Not on file   Stress: Not on file   Intimate Partner Violence: Not on file   Housing Stability: Not on file     Review of Systems:  General Positive for: Weight loss  General Negative for: Appetite Loss, Fatigue out of ordinary     ENT Negative for: Nose bleeds, Mouth sores, Sore throat     Respiratory Negative for: Coughing up blood, Wheezing, Nocturnal cough  Cardiovascular Positive for: Indigestion  Cardiovascular Negative for: Chest Pain, Leg edema  Gastronintestinal Positive for: Black/Tarry stools, Change in bowl habits, Heartburn  Gastronintestinal Negative for: Abdominal pain, Difficulty swallowing, Nausea, Vomiting     Genitourinary Negative for: Pelvic Pain, Urinary frequency  All other review of systems negative    Physical Exam:  BP 130/76   Ht 1.905 m (6\' 3" )   Wt 106 kg (233 lb 0.4 oz)   BMI 29.13 kg/m       Physical Exam  Constitutional:       General: He is not in acute distress.     Appearance: Normal appearance. He is not ill-appearing or diaphoretic.   HENT:      Head: Normocephalic and atraumatic.   Pulmonary:      Effort: Pulmonary effort is normal.   Musculoskeletal:         General: Normal range of motion.   Skin:     General: Skin is dry.   Neurological:      Mental Status: He is alert and oriented to person, place, and time.   Psychiatric:         Behavior: Behavior normal.         Judgment: Judgment normal.       No results were found from the past 30 days.    Assessment:      ICD-10-CM    1. Gastroesophageal reflux disease, unspecified whether esophagitis present  K21.9       2. Irritable bowel syndrome with diarrhea  K58.0       3. Celiac disease  K90.0           37M with history of GERD and change in bowel. Symptoms have improved with dietary changes. Taking cholestyramine 1 packet daily. GERD controlled with Pepcid 40mg  BID. Having  breakthrough with large amounts of coffee. Following a gluten free diet.    Plan:    1) GERD   Continue Pepcid 40mg  BID   Anti-reflux diet   Avoid large amounts of coffee    2) Celiac disease   Gluten free diet    3) Change in bowel   Continue cholestyramine 1 packet daily   Adequate water intake   Continue dietary changes    Follow up in 1 year        Sheila Oats, FNP-C  08/23/2021, 12:53    This note may have been partially generated using MModal Fluency Direct system, and there may be some incorrect words, spellings, and punctuation that were not noted in checking the note before saving, though effort was made to avoid such errors.

## 2021-08-24 ENCOUNTER — Other Ambulatory Visit: Payer: Self-pay

## 2021-09-03 ENCOUNTER — Encounter (HOSPITAL_BASED_OUTPATIENT_CLINIC_OR_DEPARTMENT_OTHER): Payer: Self-pay

## 2021-09-13 ENCOUNTER — Other Ambulatory Visit: Payer: Self-pay

## 2021-09-13 ENCOUNTER — Ambulatory Visit (INDEPENDENT_AMBULATORY_CARE_PROVIDER_SITE_OTHER): Payer: Medicare Other

## 2021-09-13 DIAGNOSIS — I480 Paroxysmal atrial fibrillation: Secondary | ICD-10-CM

## 2021-09-13 LAB — POCT INR (RESULTS): POCT INR: 3.1

## 2021-10-05 ENCOUNTER — Other Ambulatory Visit: Payer: Self-pay

## 2021-10-11 ENCOUNTER — Encounter (HOSPITAL_BASED_OUTPATIENT_CLINIC_OR_DEPARTMENT_OTHER): Payer: Self-pay

## 2021-10-14 ENCOUNTER — Other Ambulatory Visit: Payer: Self-pay

## 2021-10-18 ENCOUNTER — Other Ambulatory Visit (HOSPITAL_BASED_OUTPATIENT_CLINIC_OR_DEPARTMENT_OTHER): Payer: Self-pay | Admitting: UROLOGY

## 2021-10-18 MED ORDER — SILDENAFIL (PULMONARY HYPERTENSION) 20 MG TABLET
ORAL_TABLET | ORAL | 5 refills | Status: DC
Start: 2021-10-18 — End: 2023-09-28

## 2021-10-18 NOTE — Telephone Encounter (Signed)
-----   Message from Peggye Fothergill sent at 10/18/2021  8:48 AM EST -----  Pt is requesting refill for Viagra  Pt need the RX sent to [facility] in [facility] [phone]  [place] [address]    Sarah L Dillon  10/18/2021, 08:47

## 2021-10-25 ENCOUNTER — Other Ambulatory Visit: Payer: Self-pay

## 2021-10-26 ENCOUNTER — Other Ambulatory Visit: Payer: Self-pay

## 2021-10-26 ENCOUNTER — Encounter (HOSPITAL_BASED_OUTPATIENT_CLINIC_OR_DEPARTMENT_OTHER): Payer: Medicare Other

## 2021-10-29 ENCOUNTER — Encounter (HOSPITAL_BASED_OUTPATIENT_CLINIC_OR_DEPARTMENT_OTHER): Payer: Medicare Other

## 2021-10-29 ENCOUNTER — Other Ambulatory Visit: Payer: Self-pay

## 2021-10-29 ENCOUNTER — Encounter (INDEPENDENT_AMBULATORY_CARE_PROVIDER_SITE_OTHER): Payer: Self-pay | Admitting: Family

## 2021-10-29 ENCOUNTER — Telehealth: Payer: Medicare Other | Admitting: Family

## 2021-10-29 DIAGNOSIS — U071 COVID-19: Secondary | ICD-10-CM

## 2021-10-29 MED ORDER — PAXLOVID 300 MG (150 MG X 2)-100 MG TABLETS IN A DOSE PACK
ORAL_TABLET | ORAL | 0 refills | Status: AC
Start: 2021-10-29 — End: 2021-11-03

## 2021-10-29 NOTE — Progress Notes (Signed)
PR[facility] CARE, [facility]  [place] [address]    Telephone Visit    Name:  Dustin Webster MRN: [id number]   Date:  10/29/2021 Age:   65 y.o.     The patient/family initiated a request for telephone service.  Verbal consent for this service was obtained from the patient/family.    Last office visit in this department: 06/10/2021      Reason for call:  Chief Complaint   Patient presents with   . Covid     Nursing Notes:   Aleda Grana, RN  10/29/21 1531  Signed  Patient states he had positive home COVID test this morning. C/o coughing, sneezing, post nasal drip, sore throat, nasal congestion. Symptoms started 12/12. He has been taking OTC decongestants.     Call notes:  Patient is present today for positive home COVID test requesting Paxlovid prescription.  Patient reports 2 previous COVID infections treated with antibody infusion and Paxlovid.  Patient received most recent prescription for Paxlovid on 06/16/2021.  Patient states that symptoms started about 4 days ago.  Patient reports sinus congestion, rhinorrhea, postnasal drip, ear fullness, sore throat, productive cough, sneezing, diarrhea, and fatigue.  Patient denies fevers and chills, sinus pressure, headache, ear pain, chest congestion, and shortness of breath.  He reports use of over-the-counter decongestant.  He would like to repeat Paxlovid treatment.      ICD-10-CM    1. COVID-19 virus infection  U07.1 nirmatrelvir-ritonavir (PAXLOVID, EUA,) 300 mg (150 mg x 2)-100 mg Oral Tablets, Dose Pack      Patient has a history of CKD with GFR of 57 on 06/10/2021.  Patient was prescribed Paxlovid at reduced dose for CKD.  Advised patient to take medication twice daily x5 days.  Go to ED for severe symptoms.    Return if symptoms worsen or fail to improve.    Total provider time spent with the patient on the phone: 10 minutes.    Dellia Nims, APRN

## 2021-10-29 NOTE — Nursing Note (Signed)
Patient states he had positive home COVID test this morning. C/o coughing, sneezing, post nasal drip, sore throat, nasal congestion. Symptoms started 12/12. He has been taking OTC decongestants.

## 2021-10-29 NOTE — Patient Instructions (Signed)
Paxlovid is an unapproved drug that is authorized for use under Emergency Use Authorization (EUA) from the Food and Drug Administration (FDA).    The risks, benefits, unknowns of this treatment, and reasonable alternatives were discussed with me.    A fact sheet with further information on Paxlovid can be downloaded at this website:    https://www.fda.gov/media/155051/download

## 2021-11-04 ENCOUNTER — Other Ambulatory Visit: Payer: Self-pay

## 2021-11-04 ENCOUNTER — Ambulatory Visit (INDEPENDENT_AMBULATORY_CARE_PROVIDER_SITE_OTHER): Payer: Medicare Other

## 2021-11-04 DIAGNOSIS — I480 Paroxysmal atrial fibrillation: Secondary | ICD-10-CM

## 2021-11-04 LAB — POCT INR (RESULTS): POCT INR: 1.9

## 2021-11-05 ENCOUNTER — Encounter (HOSPITAL_BASED_OUTPATIENT_CLINIC_OR_DEPARTMENT_OTHER): Payer: Medicare Other

## 2021-11-10 ENCOUNTER — Other Ambulatory Visit: Payer: Self-pay

## 2021-11-20 ENCOUNTER — Other Ambulatory Visit (INDEPENDENT_AMBULATORY_CARE_PROVIDER_SITE_OTHER): Payer: Self-pay | Admitting: Family Medicine

## 2021-11-20 DIAGNOSIS — F32A Depression, unspecified: Secondary | ICD-10-CM

## 2021-11-20 DIAGNOSIS — E782 Mixed hyperlipidemia: Secondary | ICD-10-CM

## 2021-11-22 NOTE — Telephone Encounter (Signed)
Last scheduled appointment with you was 06/10/2021, Pt seen Telephone visit  10/29/21 by L. Toler  COVID  Currently scheduled future appointment is Visit date not found,   Patient has been seen within the last year: Yes.  Pt Cancelled appt 12/10/21 via my chart,   Pt needs to have follow up appointment.   Confirmed preferred pharmacy for this refill encounter is   Preferred [place], [place]    [place] [address]    Phone: [phone] Fax: [phone]    Hours: Not open 24 hours     I updated med list and Rx or Rx's are ready to be sent to   [facility], [place]  11/22/2021, 08:31

## 2021-11-30 NOTE — Progress Notes (Signed)
Primary Physician/Referring:  No primary care provider on file.  Patient ID: Brian Cummings, male    DOB: [birth year], 66 y.o.   MRN: [id number]  Chief Complaint  Patient presents with   New Patient (Initial Visit)   Coagulation Disorder   HPI:    Brian Cummings  is a 66 y.o. Caucasian male patient with CAD SP CABG in 2017, hypertension, hyperlipidemia, history of recurrent DVT and pulmonary embolism and also paroxysmal atrial fibrillation, hence has been on chronic warfarin therapy, he has had extensive hematology evaluation including at [facility] clinic with no etiology found for his hypercoagulable state.  Hence was advised to continue anticoagulation indefinitely.  He has prior history of Denali IVC filter which was retrieved in 2018, obesity, diet-controlled diabetes mellitus with stage IIIa chronic kidney disease last seen at [facility] in [place] on 08/03/2021 presents to establish care.  Presently asymptomatic.  Relocated from [place].  Past Medical History:  Diagnosis Date   AF (paroxysmal atrial fibrillation) (HCC)    Clotting disorder (HCC)    Recurrent DVT and PE   Diabetes (HCC)    Hypercholesteremia    Hypertension    Past Surgical History:  Procedure Laterality Date   BYPASS GRAFT     CORONARY ARTERY BYPASS GRAFT  12/30/2015   LIMA to LAD, SVG Y-graft to RI-OM 2, SVG to R PDA   MELANOMA EXCISION Right    RIGHT EAR   Family History  Problem Relation Age of Onset   Aneurysm Mother 5   Lung disease Father 17   Gout Brother     Social History   Tobacco Use   Smoking status: Never   Smokeless tobacco: Never  Substance Use Topics   Alcohol use: Yes    Comment: OCC   Marital Status:    ROS  Review of Systems  Cardiovascular:  Negative for chest pain, dyspnea on exertion and leg swelling.  Gastrointestinal:  Negative for melena.  Objective  Blood pressure 136/79, pulse 75, temperature 98.8 F (37.1 C), temperature source  Temporal, resp. rate 17, height 6\' 3"  (1.905 m), weight 228 lb 12.8 oz (103.8 kg), SpO2 97 %. Body mass index is 28.6 kg/m.  Vitals with BMI 12/01/2021  Height 6\' 3"   Weight 228 lbs 13 oz  BMI 28.6  Systolic 136  Diastolic 79  Pulse 75    Physical Exam Neck:     Vascular: No carotid bruit or JVD.  Cardiovascular:     Rate and Rhythm: Normal rate and regular rhythm.     Pulses: Intact distal pulses.     Heart sounds: Normal heart sounds. No murmur heard.   No gallop.  Pulmonary:     Effort: Pulmonary effort is normal.     Breath sounds: Normal breath sounds.  Abdominal:     General: Bowel sounds are normal.     Palpations: Abdomen is soft.  Musculoskeletal:        General: No swelling.     Laboratory examination:   No results for input(s): NA, K, CL, CO2, GLUCOSE, BUN, CREATININE, CALCIUM, GFRNONAA, GFRAA in the last 8760 hours. CrCl cannot be calculated (No successful lab value found.).  No flowsheet data found. No flowsheet data found.  Lipid Panel No results for input(s): CHOL, TRIG, LDLCALC, VLDL, HDL, CHOLHDL, LDLDIRECT in the last 8760 hours. Lipid Panel  No results found for: CHOL, TRIG, HDL, CHOLHDL, VLDL, LDLCALC, LDLDIRECT, LABVLDL   HEMOGLOBIN A1C No results found for: HGBA1C, MPG TSH  No results for input(s): TSH in the last 8760 hours.  External labs:   NA Medications and allergies   Allergies  Allergen Reactions   Statins     Muscle aches     Medication prior to this encounter:   Outpatient Medications Prior to Visit  Medication Sig Dispense Refill   allopurinol (ZYLOPRIM) 100 MG tablet Take 100 mg by mouth daily.     amLODipine (NORVASC) 10 MG tablet Take 10 mg by mouth daily.     calcium carbonate (OSCAL) 1500 (600 Ca) MG TABS tablet Take by mouth daily.     Cholecalciferol (VITAMIN D3) 1.25 MG (50000 UT) TABS Take 1 tablet by mouth daily.     cholestyramine (QUESTRAN) 4 g packet Take 4 g by mouth daily.     citalopram (CELEXA) 10 MG  tablet Take 10 mg by mouth daily.     Coenzyme Q10 (COQ-10) 400 MG CAPS Take 1 capsule by mouth daily.     famotidine (PEPCID) 40 MG tablet Take 40 mg by mouth daily.     fenofibrate (TRICOR) 145 MG tablet Take 145 mg by mouth daily.     ferrous sulfate 325 (65 FE) MG EC tablet Take 325 mg by mouth 2 (two) times daily.     fluocinonide gel (LIDEX) 0.05 % Apply 1 application topically as needed.     hydrocortisone 2.5 % cream Apply 1 application topically as needed.     KRILL OIL PO Take 1 capsule by mouth daily.     LUTEIN PO Take 1 capsule by mouth daily.     Magnesium 500 MG CAPS Take 1 capsule by mouth daily.     melatonin 1 MG TABS tablet Take 1 mg by mouth as needed.     MULTIPLE VITAMIN PO Take 1 tablet by mouth daily.     nitroGLYCERIN (NITROSTAT) 0.4 MG SL tablet Place 0.4 mg under the tongue every 5 (five) minutes as needed for chest pain.     vitamin C (ASCORBIC ACID) 500 MG tablet Take 500 mg by mouth daily.     warfarin (COUMADIN) 5 MG tablet Take 5 mg by mouth daily. 10 mg M W F 8 mg T Th S Sun     aspirin 81 MG chewable tablet Chew 81 mg by mouth daily.     Evolocumab (REPATHA SURECLICK) 140 MG/ML SOAJ Inject 1 Applicatorful into the skin every 30 (thirty) days.     No facility-administered medications prior to visit.     Medication list after today's encounter   Current Outpatient Medications  Medication Instructions   allopurinol (ZYLOPRIM) 100 mg, Oral, Daily   amLODipine (NORVASC) 10 mg, Oral, Daily   calcium carbonate (OSCAL) 1500 (600 Ca) MG TABS tablet Oral, Daily   Cholecalciferol (VITAMIN D3) 1.25 MG (50000 UT) TABS 1 tablet, Oral, Daily   cholestyramine (QUESTRAN) 4 g, Oral, Daily   citalopram (CELEXA) 10 mg, Oral, Daily   Coenzyme Q10 (COQ-10) 400 MG CAPS 1 capsule, Oral, Daily   Evolocumab (REPATHA SURECLICK) 140 MG/ML SOAJ 1 Applicatorful, Subcutaneous, Every 30 days   famotidine (PEPCID) 40 mg, Oral, Daily   fenofibrate (TRICOR) 145 mg, Oral, Daily    ferrous sulfate 325 mg, Oral, 2 times daily   fluocinonide gel (LIDEX) 0.05 % 1 application, Topical, As needed   hydrocortisone 2.5 % cream 1 application, Topical, As needed   KRILL OIL PO 1 capsule, Oral, Daily   LUTEIN PO 1 capsule, Oral, Daily   Magnesium 500 MG  CAPS 1 capsule, Oral, Daily   melatonin 1 mg, Oral, As needed   MULTIPLE VITAMIN PO 1 tablet, Oral, Daily   nitroGLYCERIN (NITROSTAT) 0.4 mg, Sublingual, Every 5 min PRN   vitamin C (ASCORBIC ACID) 500 mg, Oral, Daily   warfarin (COUMADIN) 5 mg, Oral, Daily, 10 mg M W F<BR>8 mg T Th S Sun    Radiology:   No results found.  Cardiac Studies:  Coronary angiogram 05/11/2018: LM: Large, mild disease. LAD proximal LAD 100% stenosed.  LIMA to LAD widely patent. RI: 100% stenosed. CX: OM 2 large 100% stenosed. SVG Y-graft to RI and OM 2 widely patent. Proximal RCA mild disease, ostial right PDA 100% stenosed.  SVG to PDA widely patent. LVEF 60%.  Holter monitor 09/18/2019: Minimum heart rate 50 bpm, average heart rate 67 bpm, rare PVCs and PACs. Brief atrial tachycardia, longest 5 beats.  Symptoms correlated with normal sinus rhythm.  Dobutamine stress echocardiogram 03/09/2021: EKG: Normal sinus rhythm.  Occasional PACs with dobutamine infusion. No dobutamine induced LV dilatation or wall motion abnormality.  Normal LV size and function.  Echocardiogram 03/09/2021: Low normal LVEF, EF 50 to 55%.  No segmental wall motion abnormality.  Normal diastolic function. Mild aortic valve sclerosis. Mild MR and TR and PI.  Abdominal aortic duplex 2022: Proximal abdominal dimensions 2.1 x 2.5 cm, mid abdominal aorta measures 2.8 x 2.3 cm, distal aorta measures 2.4 x 2.7 cm.  Common iliac arteries measure approximately 1.4 cm.  No aneurysmal dilatation of the abdominal aorta is seen.  EKG:   EKG 12/01/2021: Normal sinus rhythm at rate of 69 bpm, normal axis.  Nonspecific high lateral T abnormality.    Assessment     ICD-10-CM    1. Coronary artery disease involving native coronary artery of native heart without angina pectoris  I25.10 EKG 12-Lead    2. Primary hypertension  I10 EKG 12-Lead    3. Paroxysmal atrial fibrillation (HCC)  I48.0     4. Pure hypercholesterolemia  E78.00 Evolocumab (REPATHA SURECLICK) 140 MG/ML SOAJ    5. Primary hypercoagulable state (HCC)  D68.59     6. Long term (current) use of anticoagulants  Z79.01     7. Acute venous embolism and thrombosis of deep vessels of proximal end of left lower extremity (HCC)  I82.4Y2        Medications Discontinued During This Encounter  Medication Reason   aspirin 81 MG chewable tablet Discontinued by provider   Evolocumab (REPATHA SURECLICK) 140 MG/ML SOAJ Reorder    Meds ordered this encounter  Medications   Evolocumab (REPATHA SURECLICK) 140 MG/ML SOAJ    Sig: Inject 1 Applicatorful into the skin every 30 (thirty) days.    Dispense:  6 mL    Refill:  3   Orders Placed This Encounter  Procedures   POCT INR    This back office order was created through the Results Console.   EKG 12-Lead   Recommendations:   Brian Cummings is a 66 y.o. Caucasian male patient with CAD SP CABG in 2017, hypertension, hyperlipidemia, history of recurrent DVT and pulmonary embolism and also paroxysmal atrial fibrillation, hence has been on chronic warfarin therapy, he has had extensive hematology evaluation including at [facility] clinic with no etiology found for his hypercoagulable state.  Hence was advised to continue anticoagulation indefinitely.  He has prior history of Denali IVC filter which was retrieved in 2018, obesity, diet-controlled diabetes mellitus with stage IIIa chronic kidney disease last seen at [facility]  in [place] on 08/03/2021 presents to establish care.  He presently remains asymptomatic without any chest pain or dyspnea.  I reviewed his external records and updated the for now advised him that he could certainly  discontinue aspirin to reduce bleeding risk as his coronary artery disease has been stable.  Blood pressure is well controlled.  I do not have his recent labs but patient has had statin induced marked LFT abnormality and is presently on Repatha, I have refilled the prescription for continuation of therapy.  He will be enrolled in our Coumadin clinic.  Goal INR 2-3.  Patient will try to download his labs and bring them to me on his next office visit with me in 1 month.   Yates Decamp, MD, [facility] 12/01/2021, 8:53 PM Office: [phone]

## 2021-12-01 ENCOUNTER — Encounter: Payer: Self-pay | Admitting: Cardiology

## 2021-12-01 ENCOUNTER — Ambulatory Visit: Payer: Medicare Other | Admitting: Cardiology

## 2021-12-01 ENCOUNTER — Other Ambulatory Visit: Payer: Self-pay

## 2021-12-01 VITALS — BP 136/79 | HR 75 | Temp 98.8°F | Resp 17 | Ht 75.0 in | Wt 228.8 lb

## 2021-12-01 DIAGNOSIS — I4891 Unspecified atrial fibrillation: Secondary | ICD-10-CM | POA: Insufficient documentation

## 2021-12-01 DIAGNOSIS — Z7901 Long term (current) use of anticoagulants: Secondary | ICD-10-CM

## 2021-12-01 DIAGNOSIS — I10 Essential (primary) hypertension: Secondary | ICD-10-CM

## 2021-12-01 DIAGNOSIS — D6859 Other primary thrombophilia: Secondary | ICD-10-CM | POA: Insufficient documentation

## 2021-12-01 DIAGNOSIS — I824Y2 Acute embolism and thrombosis of unspecified deep veins of left proximal lower extremity: Secondary | ICD-10-CM

## 2021-12-01 DIAGNOSIS — I480 Paroxysmal atrial fibrillation: Secondary | ICD-10-CM

## 2021-12-01 DIAGNOSIS — E7800 Pure hypercholesterolemia, unspecified: Secondary | ICD-10-CM

## 2021-12-01 DIAGNOSIS — I2510 Atherosclerotic heart disease of native coronary artery without angina pectoris: Secondary | ICD-10-CM

## 2021-12-01 DIAGNOSIS — I824Y9 Acute embolism and thrombosis of unspecified deep veins of unspecified proximal lower extremity: Secondary | ICD-10-CM | POA: Insufficient documentation

## 2021-12-01 LAB — POCT INR: INR: 2.1 (ref 2.0–3.0)

## 2021-12-01 MED ORDER — REPATHA SURECLICK 140 MG/ML ~~LOC~~ SOAJ: 1.0000 | SUBCUTANEOUS | 3 refills | Status: DC

## 2021-12-01 NOTE — Patient Instructions (Signed)
INR at goal. Continue current weekly dose of 10 mg every Mon, Wed, Fri and 8 mg all other days. Recheck INR in 2 weeks.

## 2021-12-10 ENCOUNTER — Encounter (INDEPENDENT_AMBULATORY_CARE_PROVIDER_SITE_OTHER): Payer: Self-pay | Admitting: Family Medicine

## 2021-12-11 ENCOUNTER — Other Ambulatory Visit (INDEPENDENT_AMBULATORY_CARE_PROVIDER_SITE_OTHER): Payer: Self-pay | Admitting: Family Medicine

## 2021-12-11 DIAGNOSIS — M1A9XX0 Chronic gout, unspecified, without tophus (tophi): Secondary | ICD-10-CM

## 2021-12-13 NOTE — Telephone Encounter (Signed)
Last scheduled appointment with you was 06/10/2021, Currently scheduled future appointment is Visit date not found,     Patient has been seen within the last year: Yes.    Confirmed preferred pharmacy for this refill encounter is   Preferred Pharmacy     [place], [place]    [place] [address]    Phone: [phone] Fax: [phone]    Hours: Not open 24 hours    [id number] [place], [place]    [address] [place] [address]    Phone: [phone] Fax: [phone]    Hours: Not open 24 hours    [id number] - [place], [place] - [address] AT [place]    [place] [address]    Phone: [phone] Fax: [phone]    Hours: Not open 24 hours      .     Janora Norlander, MA  12/13/2021, 09:21

## 2021-12-16 ENCOUNTER — Other Ambulatory Visit: Payer: Self-pay

## 2021-12-16 ENCOUNTER — Ambulatory Visit: Payer: Medicare Other | Admitting: Pharmacist

## 2021-12-16 DIAGNOSIS — I824Y2 Acute embolism and thrombosis of unspecified deep veins of left proximal lower extremity: Secondary | ICD-10-CM

## 2021-12-16 DIAGNOSIS — Z5181 Encounter for therapeutic drug level monitoring: Secondary | ICD-10-CM

## 2021-12-16 DIAGNOSIS — Z7901 Long term (current) use of anticoagulants: Secondary | ICD-10-CM

## 2021-12-16 DIAGNOSIS — I480 Paroxysmal atrial fibrillation: Secondary | ICD-10-CM

## 2021-12-16 DIAGNOSIS — D6859 Other primary thrombophilia: Secondary | ICD-10-CM

## 2021-12-16 LAB — POCT INR: INR: 2.7 (ref 2.0–3.0)

## 2021-12-16 NOTE — Progress Notes (Signed)
Anticoagulation Management Brian Cummings is a 66 y.o. male who reports to the clinic for monitoring of warfarin treatment.    Indication: atrial fibrillation, DVT, and PE CHA2DS2 Vasc Score 2 (Age 56-74, HTN hx), HAS-BLED 1  Duration: indefinite Supervising physician: Adrian Prows  Anticoagulation Clinic Visit History:  Pt using 10 mg tablets and 4 mg tablets at home. Hx of multiple prior DVT and PE. No identifiable hx of hypercoagulable markers. No labs available to confirm. Previously being managed through coumadin clinic in [place].   Patient does not report signs/symptoms of bleeding or thromboembolism   Other recent changes: No changes in diet, medications, lifestyle  Anticoagulation Episode Summary     Current INR goal:  2.0-3.0  TTR:  100.0 % (5 d)  Next INR check:  12/30/2021  INR from last check:  2.7 (12/16/2021)  Weekly max warfarin dose:    Target end date:    INR check location:    Preferred lab:    Send INR reminders to:     Indications   Primary hypercoagulable state ([place]) [D68.59] Long term (current) use of anticoagulants [Z79.01] Atrial fibrillation ([place]) [I48.91] Acute venous embolism and thrombosis of deep vessels of proximal lower extremity ([place]) [I82.4Y9]        Comments:  Prior DVT and PE (x3), paroxysmal AFib, hypercoaguable state         Allergies  Allergen Reactions   Statins     Muscle aches    Current Outpatient Medications:    allopurinol (ZYLOPRIM) 100 MG tablet, Take 100 mg by mouth daily., Disp: , Rfl:    amLODipine (NORVASC) 10 MG tablet, Take 10 mg by mouth daily., Disp: , Rfl:    calcium carbonate (OSCAL) 1500 (600 Ca) MG TABS tablet, Take by mouth daily., Disp: , Rfl:    Cholecalciferol (VITAMIN D3) 1.25 MG (50000 UT) TABS, Take 1 tablet by mouth daily., Disp: , Rfl:    cholestyramine (QUESTRAN) 4 g packet, Take 4 g by mouth daily., Disp: , Rfl:    citalopram (CELEXA) 10 MG tablet, Take 10 mg by mouth daily., Disp: , Rfl:    Coenzyme Q10  (COQ-10) 400 MG CAPS, Take 1 capsule by mouth daily., Disp: , Rfl:    Evolocumab (REPATHA SURECLICK) [id number] MG/ML SOAJ, Inject 1 Applicatorful into the skin every 30 (thirty) days., Disp: 6 mL, Rfl: 3   famotidine (PEPCID) 40 MG tablet, Take 40 mg by mouth daily., Disp: , Rfl:    fenofibrate (TRICOR) 145 MG tablet, Take 145 mg by mouth daily., Disp: , Rfl:    ferrous sulfate 325 (65 FE) MG EC tablet, Take 325 mg by mouth 2 (two) times daily., Disp: , Rfl:    fluocinonide gel (LIDEX) [id number] %, Apply 1 application topically as needed., Disp: , Rfl:    hydrocortisone 2.5 % cream, Apply 1 application topically as needed., Disp: , Rfl:    KRILL OIL PO, Take 1 capsule by mouth daily., Disp: , Rfl:    LUTEIN PO, Take 1 capsule by mouth daily., Disp: , Rfl:    Magnesium 500 MG CAPS, Take 1 capsule by mouth daily., Disp: , Rfl:    melatonin 1 MG TABS tablet, Take 1 mg by mouth as needed., Disp: , Rfl:    MULTIPLE VITAMIN PO, Take 1 tablet by mouth daily., Disp: , Rfl:    nitroGLYCERIN (NITROSTAT) 0.4 MG SL tablet, Place 0.4 mg under the tongue every 5 (five) minutes as needed for chest pain., Disp: , Rfl:  vitamin C (ASCORBIC ACID) 500 MG tablet, Take 500 mg by mouth daily., Disp: , Rfl:    warfarin (COUMADIN) 5 MG tablet, Take 5 mg by mouth daily. 10 mg M W F 8 mg T Th S Sun, Disp: , Rfl:  Past Medical History:  Diagnosis Date   AF (paroxysmal atrial fibrillation) (HCC)    Clotting disorder (HCC)    Recurrent DVT and PE   Diabetes ([place])    Hypercholesteremia    Hypertension     ASSESSMENT  Recent Results: The most recent result is correlated with 62 mg per week:  Lab Results  Component Value Date   INR 2.7 12/16/2021   INR 2.1 12/01/2021    Anticoagulation Dosing: Description   INR at goal. Continue current weekly dose of 10 mg every Mon, Wed, Fri and 8 mg all other days. Recheck INR in 2 weeks.       INR today: Therapeutic. Continues to remain therapeutic on current weekly dose of 62  mg/week  Denies any complains of bleeding or bruising symptoms. Denies any other relevant changes in her diet, medications, or lifestyle. Will continue current weekly dose and continue close monitoring and follow up.    PLAN Weekly dose was unchanged by  0 % to 62 mg per week. Continue current weekly dose of 10 mg every Mon, Wed, Fri and 8 mg all other days. Recheck INR in 2 weeks.   Patient Instructions  INR at goal. Continue current weekly dose of 10 mg every Mon, Wed, Fri and 8 mg all other days. Recheck INR in 2 weeks.  Patient advised to contact clinic or seek medical attention if signs/symptoms of bleeding or thromboembolism occur.  Patient verbalized understanding by repeating back information and was advised to contact me if further medication-related questions arise.   Follow-up Return in about 2 weeks (around 12/30/2021).  Alysia Penna, PharmD  15 minutes spent face-to-face with the patient during the encounter. 50% of time spent on education, including signs/sx bleeding and clotting, as well as food and drug interactions with warfarin. 50% of time was spent on fingerprick POC INR sample collection,processing, results determination, and documentation

## 2021-12-16 NOTE — Patient Instructions (Addendum)
INR at goal. Continue current weekly dose of 10 mg every Mon, Wed, Fri and 8 mg all other days. Recheck INR in 2 weeks.  °

## 2021-12-27 ENCOUNTER — Other Ambulatory Visit: Payer: Self-pay

## 2021-12-27 ENCOUNTER — Encounter (INDEPENDENT_AMBULATORY_CARE_PROVIDER_SITE_OTHER): Payer: Self-pay | Admitting: NURSE PRACTITIONER

## 2021-12-30 ENCOUNTER — Encounter: Payer: Self-pay | Admitting: Cardiology

## 2021-12-30 ENCOUNTER — Ambulatory Visit: Payer: Medicare Other | Admitting: Cardiology

## 2021-12-30 ENCOUNTER — Other Ambulatory Visit: Payer: Self-pay

## 2021-12-30 VITALS — BP 133/85 | HR 61 | Temp 97.9°F | Resp 16 | Ht 75.0 in | Wt 230.0 lb

## 2021-12-30 DIAGNOSIS — I25118 Atherosclerotic heart disease of native coronary artery with other forms of angina pectoris: Secondary | ICD-10-CM

## 2021-12-30 DIAGNOSIS — D6859 Other primary thrombophilia: Secondary | ICD-10-CM

## 2021-12-30 DIAGNOSIS — I480 Paroxysmal atrial fibrillation: Secondary | ICD-10-CM

## 2021-12-30 DIAGNOSIS — I10 Essential (primary) hypertension: Secondary | ICD-10-CM

## 2021-12-30 DIAGNOSIS — N1831 Chronic kidney disease, stage 3a: Secondary | ICD-10-CM

## 2021-12-30 DIAGNOSIS — Z7901 Long term (current) use of anticoagulants: Secondary | ICD-10-CM

## 2021-12-30 DIAGNOSIS — E7800 Pure hypercholesterolemia, unspecified: Secondary | ICD-10-CM

## 2021-12-30 DIAGNOSIS — I824Y2 Acute embolism and thrombosis of unspecified deep veins of left proximal lower extremity: Secondary | ICD-10-CM

## 2021-12-30 LAB — POCT INR: INR: 2.4 (ref 2.0–3.0)

## 2021-12-30 MED ORDER — REPATHA PUSHTRONEX SYSTEM 420 MG/3.5ML ~~LOC~~ SOCT: 1.0000 [IU] | SUBCUTANEOUS | 12 refills | Status: DC

## 2021-12-30 MED ORDER — LOSARTAN POTASSIUM 50 MG PO TABS: 50.0000 mg | ORAL_TABLET | Freq: Every day | ORAL | 2 refills | Status: DC

## 2021-12-30 MED ORDER — AMLODIPINE BESYLATE 5 MG PO TABS: 5.0000 mg | ORAL_TABLET | Freq: Every day | ORAL | 3 refills | Status: DC

## 2021-12-30 NOTE — Patient Instructions (Signed)
INR at goal. Continue current weekly dose of 10 mg every Mon, Wed, Fri and 8 mg all other days. Recheck INR in 4 weeks.

## 2021-12-30 NOTE — Progress Notes (Signed)
Primary Physician/Referring:  Georgianne Fick, MD  Patient ID: Brian Cummings, male    DOB: Oct 31, 1956, 66 y.o.   MRN: [id number]  Chief Complaint  Patient presents with   Coronary Artery Disease   Hypertension   Follow-up    4 weeks    HPI:    Brian Cummings  is a 66 y.o. Caucasian male patient with CAD SP CABG in 2017, hypertension, hyperlipidemia, chronic stage IIIa kidney disease, diet-controlled diabetes mellitus, history of recurrent DVT and pulmonary embolism and also paroxysmal atrial fibrillation, hence has been on chronic warfarin therapy, he has had extensive hematology evaluation including at [facility] clinic with no etiology found for his hypercoagulable state.  Hence was advised to continue anticoagulation indefinitely.  He has prior history of [place] IVC filter which was retrieved in 2018, obesity, diet-controlled diabetes mellitus with stage IIIa chronic kidney disease last seen at [facility] in [place] on 08/03/2021.  6-week office visit, states that he wanted to mention that he occasionally has left-sided chest pain that last almost an entire day and no relationship to nitroglycerin or exertion.  Past Medical History:  Diagnosis Date   AF (paroxysmal atrial fibrillation) (HCC)    Clotting disorder (HCC)    Recurrent DVT and PE   Diabetes (HCC)    Hypercholesteremia    Hypertension    Past Surgical History:  Procedure Laterality Date   BYPASS GRAFT     CORONARY ARTERY BYPASS GRAFT  12/30/2015   LIMA to LAD, SVG Y-graft to RI-OM 2, SVG to R PDA   MELANOMA EXCISION Right    RIGHT EAR   Family History  Problem Relation Age of Onset   Aneurysm Mother 47   Lung disease Father 5   Gout Brother     Social History   Tobacco Use   Smoking status: Never   Smokeless tobacco: Never  Substance Use Topics   Alcohol use: Yes    Comment: OCC   Marital Status:    ROS  Review of Systems  Cardiovascular:  Positive for chest pain  (occasinal). Negative for dyspnea on exertion and leg swelling.  Gastrointestinal:  Negative for melena.  Objective  Blood pressure 133/85, pulse 61, temperature 97.9 F (36.6 C), temperature source Temporal, resp. rate 16, height 6\' 3"  (1.905 m), weight 230 lb (104.3 kg), SpO2 97 %. Body mass index is 28.75 kg/m.  Vitals with BMI 12/30/2021 12/01/2021  Height 6\' 3"  6\' 3"   Weight 230 lbs 228 lbs 13 oz  BMI 28.75 28.6  Systolic 133 136  Diastolic 85 79  Pulse 61 75    Physical Exam Neck:     Vascular: No carotid bruit or JVD.  Cardiovascular:     Rate and Rhythm: Normal rate and regular rhythm.     Pulses: Intact distal pulses.     Heart sounds: Normal heart sounds. No murmur heard.   No gallop.  Pulmonary:     Effort: Pulmonary effort is normal.     Breath sounds: Normal breath sounds.  Musculoskeletal:     Right lower leg: No edema.     Left lower leg: No edema.     Laboratory examination:   External labs:   Lab 12/23/2021:  A1c 6.9%.  Total cholesterol 117, triglycerides 137, HDL 39, LDL 51.  Vitamin D 46.3.  Sodium 142, potassium 4.6, BUN 23, creatinine 1.48, EGFR 48 mL, LFT normal.  Medications and allergies   Allergies  Allergen Reactions   Statins  Muscle aches    Medication list after today's encounter    Current Outpatient Medications:    allopurinol (ZYLOPRIM) 100 MG tablet, Take 100 mg by mouth daily., Disp: , Rfl:    calcium carbonate (OSCAL) 1500 (600 Ca) MG TABS tablet, Take by mouth daily., Disp: , Rfl:    Cholecalciferol (VITAMIN D3) 1.25 MG (50000 UT) TABS, Take 1 tablet by mouth daily., Disp: , Rfl:    cholestyramine (QUESTRAN) 4 g packet, Take 4 g by mouth daily., Disp: , Rfl:    citalopram (CELEXA) 10 MG tablet, Take 10 mg by mouth daily., Disp: , Rfl:    Coenzyme Q10 (COQ-10) 400 MG CAPS, Take 1 capsule by mouth daily., Disp: , Rfl:    Evolocumab with Infusor (REPATHA PUSHTRONEX SYSTEM) 420 MG/3.5ML SOCT, Inject 1 Units into the skin  every 28 (twenty-eight) days., Disp: 3.6 mL, Rfl: 12   famotidine (PEPCID) 40 MG tablet, Take 40 mg by mouth daily., Disp: , Rfl:    fenofibrate (TRICOR) 145 MG tablet, Take 145 mg by mouth daily., Disp: , Rfl:    ferrous sulfate 325 (65 FE) MG EC tablet, Take 325 mg by mouth 2 (two) times daily., Disp: , Rfl:    fluocinonide gel (LIDEX) 0.05 %, Apply 1 application topically as needed., Disp: , Rfl:    hydrocortisone 2.5 % cream, Apply 1 application topically as needed., Disp: , Rfl:    KRILL OIL PO, Take 1 capsule by mouth daily., Disp: , Rfl:    losartan (COZAAR) 50 MG tablet, Take 1 tablet (50 mg total) by mouth daily., Disp: 30 tablet, Rfl: 2   LUTEIN PO, Take 1 capsule by mouth daily., Disp: , Rfl:    Magnesium 500 MG CAPS, Take 1 capsule by mouth daily., Disp: , Rfl:    melatonin 1 MG TABS tablet, Take 1 mg by mouth as needed., Disp: , Rfl:    MULTIPLE VITAMIN PO, Take 1 tablet by mouth daily., Disp: , Rfl:    nitroGLYCERIN (NITROSTAT) 0.4 MG SL tablet, Place 0.4 mg under the tongue every 5 (five) minutes as needed for chest pain., Disp: , Rfl:    vitamin C (ASCORBIC ACID) 500 MG tablet, Take 500 mg by mouth daily., Disp: , Rfl:    warfarin (COUMADIN) 5 MG tablet, Take 5 mg by mouth daily. 10 mg M W F 8 mg T Th S Sun, Disp: , Rfl:    amLODipine (NORVASC) 5 MG tablet, Take 1 tablet (5 mg total) by mouth daily., Disp: 90 tablet, Rfl: 3  Radiology:   No results found.  Cardiac Studies:  Coronary angiogram 05/11/2018: LM: Large, mild disease. LAD proximal LAD 100% stenosed.  LIMA to LAD widely patent. RI: 100% stenosed. CX: OM 2 large 100% stenosed. SVG Y-graft to RI and OM 2 widely patent. Proximal RCA mild disease, ostial right PDA 100% stenosed.  SVG to PDA widely patent. LVEF 60%.  Holter monitor 09/18/2019: Minimum heart rate 50 bpm, average heart rate 67 bpm, rare PVCs and PACs. Brief atrial tachycardia, longest 5 beats.  Symptoms correlated with normal sinus  rhythm.  Dobutamine stress echocardiogram 03/09/2021: EKG: Normal sinus rhythm.  Occasional PACs with dobutamine infusion. No dobutamine induced LV dilatation or wall motion abnormality.  Normal LV size and function.  Echocardiogram 03/09/2021: Low normal LVEF, EF 50 to 55%.  No segmental wall motion abnormality.  Normal diastolic function. Mild aortic valve sclerosis. Mild MR and TR and PI.  Abdominal aortic duplex 2022: Proximal abdominal dimensions  2.1 x 2.5 cm, mid abdominal aorta measures 2.8 x 2.3 cm, distal aorta measures 2.4 x 2.7 cm.  Common iliac arteries measure approximately 1.4 cm.  No aneurysmal dilatation of the abdominal aorta is seen.  EKG:   EKG 12/01/2021: Normal sinus rhythm at rate of 69 bpm, normal axis.  Nonspecific high lateral T abnormality.    Assessment     ICD-10-CM   1. Coronary artery disease of native artery of native heart with stable angina pectoris (HCC)  I25.118 Evolocumab with Infusor (REPATHA PUSHTRONEX SYSTEM) 420 MG/3.5ML SOCT    2. Paroxysmal atrial fibrillation (HCC)  I48.0     3. Primary hypercoagulable state (HCC)  D68.59 POCT INR    4. Primary hypertension  I10 amLODipine (NORVASC) 5 MG tablet    5. Stage 3a chronic kidney disease (HCC)  N18.31 losartan (COZAAR) 50 MG tablet    Basic metabolic panel    6. Pure hypercholesterolemia  E78.00 Evolocumab with Infusor (REPATHA PUSHTRONEX SYSTEM) 420 MG/3.5ML SOCT    7. Long term (current) use of anticoagulants  Z79.01     8. Acute venous embolism and thrombosis of deep vessels of proximal end of left lower extremity (HCC)  I82.4Y2        Medications Discontinued During This Encounter  Medication Reason   Evolocumab (REPATHA SURECLICK) 140 MG/ML SOAJ Change in therapy   amLODipine (NORVASC) 10 MG tablet Reorder    Meds ordered this encounter  Medications   losartan (COZAAR) 50 MG tablet    Sig: Take 1 tablet (50 mg total) by mouth daily.    Dispense:  30 tablet    Refill:  2    amLODipine (NORVASC) 5 MG tablet    Sig: Take 1 tablet (5 mg total) by mouth daily.    Dispense:  90 tablet    Refill:  3   Evolocumab with Infusor (REPATHA PUSHTRONEX SYSTEM) 420 MG/3.5ML SOCT    Sig: Inject 1 Units into the skin every 28 (twenty-eight) days.    Dispense:  3.6 mL    Refill:  12   Orders Placed This Encounter  Procedures   Basic metabolic panel   POCT INR    This back office order was created through the Results Console.   INR was 2.4 and therapeutic today.  Recommendations:   Brian Cummings is a 66 y.o. Caucasian male patient with CAD SP CABG in 2017, hypertension, hyperlipidemia, chronic stage IIIa kidney disease, diet-controlled diabetes mellitus, history of recurrent DVT and pulmonary embolism and also paroxysmal atrial fibrillation, hence has been on chronic warfarin therapy, he has had extensive hematology evaluation including at [facility] clinic with no etiology found for his hypercoagulable state.  Hence was advised to continue anticoagulation indefinitely.  He has had occasional chest pain which appears to be musculoskeletal.  He probably has chronic stable angina as well.  As he is remained stable, continue present medical therapy.  I reviewed his external labs, he does have chronic stage III kidney disease, he is also diabetic with recent A1c being >6.5%.  I will start him on losartan 50 mg daily, will reduce amlodipine to 5 mg daily, will obtain BMP in 2 to 3 weeks.  We reviewed the lipids, he is presently on Repatha as he has been statin intolerant, I have changed him from 140 mg dose to 420 mg once a month dosing for convenience.  Otherwise remained stable from cardiac standpoint, I will see him back in 6 months for follow-up.  He will continue  to be no Coumadin clinic and goal INR 2-3.    Yates Decamp, MD, [facility] 12/30/2021, 7:17 PM Office: [phone]

## 2022-01-11 ENCOUNTER — Telehealth: Payer: Self-pay | Admitting: Cardiology

## 2022-01-11 ENCOUNTER — Other Ambulatory Visit: Payer: Self-pay

## 2022-01-11 NOTE — Telephone Encounter (Signed)
Patient requesting refill on some medication, 90 day supply if possible. Patient switched new insurance and uses [facility] [address] now.

## 2022-01-11 NOTE — Telephone Encounter (Signed)
Called pt, no answer. Not sure which medications he needs. They were filled 12/30/[facility] to [facility] [address].

## 2022-01-13 ENCOUNTER — Telehealth: Payer: Self-pay

## 2022-01-13 DIAGNOSIS — I480 Paroxysmal atrial fibrillation: Secondary | ICD-10-CM

## 2022-01-13 DIAGNOSIS — D6859 Other primary thrombophilia: Secondary | ICD-10-CM

## 2022-01-13 DIAGNOSIS — Z7901 Long term (current) use of anticoagulants: Secondary | ICD-10-CM

## 2022-01-13 MED ORDER — WARFARIN SODIUM 4 MG PO TABS: 4.0000 mg | ORAL_TABLET | Freq: Every day | ORAL | 2 refills | Status: DC

## 2022-01-13 MED ORDER — WARFARIN SODIUM 10 MG PO TABS: 10.0000 mg | ORAL_TABLET | Freq: Every day | ORAL | 3 refills | Status: DC

## 2022-01-13 NOTE — Telephone Encounter (Signed)
Pt requesting Warfarin 4mg  and 10 mg be [facility] to [facility] on [address]. Please advise.

## 2022-01-13 NOTE — Addendum Note (Signed)
Addended by: Cassell Clement T on: 01/13/2022 05:46 PM   Modules accepted: Orders

## 2022-01-27 ENCOUNTER — Other Ambulatory Visit: Payer: Self-pay

## 2022-01-27 ENCOUNTER — Ambulatory Visit: Payer: Medicare Other | Admitting: Pharmacist

## 2022-01-27 DIAGNOSIS — I480 Paroxysmal atrial fibrillation: Secondary | ICD-10-CM

## 2022-01-27 DIAGNOSIS — D6859 Other primary thrombophilia: Secondary | ICD-10-CM

## 2022-01-27 DIAGNOSIS — I824Y2 Acute embolism and thrombosis of unspecified deep veins of left proximal lower extremity: Secondary | ICD-10-CM

## 2022-01-27 DIAGNOSIS — Z7901 Long term (current) use of anticoagulants: Secondary | ICD-10-CM

## 2022-01-27 LAB — POCT INR: INR: 2.1 (ref 2.0–3.0)

## 2022-01-27 NOTE — Progress Notes (Signed)
Anticoagulation Management ?[name] is a 66 y.o. male who reports to the clinic for monitoring of warfarin treatment.   ? ?Indication: atrial fibrillation, DVT, and PE CHA2DS2 Vasc Score 2 (Age 9-74, HTN hx), HAS-BLED 1  ?Duration: indefinite ?Supervising physician: Adrian Prows ? ?Anticoagulation Clinic Visit History: ? ?Pt using 10 mg tablets and 4 mg tablets at home. Hx of multiple prior DVT and PE. No identifiable hx of hypercoagulable markers. No labs available to confirm. Previously being managed through coumadin clinic in [place].  ? ?Patient does not report signs/symptoms of bleeding or thromboembolism  ? ?Other recent changes: No changes in diet, medications, lifestyle ? ?Pt reports that he is tolerating recent start losartan 50 mg and amlodipine dose decrease. Pt states that he got a letter from his insurance stating that his [place] pushtronex system wasn't covered. Unclear if a PA was completed. PA started on pt's behalf. Pt to complete lab work later this week to ensure renal function and electrolyte continues to remain stable following starting losartan.  ? ?Anticoagulation Episode Summary   ? ? Current INR goal:  2.0-3.0  ?TTR:  100.0 % (1.6 mo)  ?Next INR check:  03/03/2022  ?INR from last check:  2.1 (01/27/2022)  ?Weekly max warfarin dose:    ?Target end date:    ?INR check location:    ?Preferred lab:    ?Send INR reminders to:    ? Indications   ?Primary hypercoagulable state ([place]) [D68.59] ?Long term (current) use of anticoagulants [Z79.01] ?Atrial fibrillation ([place]) [I48.91] ?Acute venous embolism and thrombosis of deep vessels of proximal lower extremity ([place]) [I82.4Y9] ? ?  ?  ? ? Comments:  Prior DVT and PE (x3), paroxysmal AFib, hypercoaguable state  ?  ? ?  ? ? ?Allergies  ?Allergen Reactions  ? Statins   ?  Muscle aches  ? ? ?Current Outpatient Medications:  ?  allopurinol (ZYLOPRIM) 100 MG tablet, Take 100 mg by mouth daily., Disp: , Rfl:  ?  amLODipine (NORVASC) 5 MG tablet, Take 1 tablet (5  mg total) by mouth daily., Disp: 90 tablet, Rfl: 3 ?  calcium carbonate (OSCAL) 1500 (600 Ca) MG TABS tablet, Take by mouth daily., Disp: , Rfl:  ?  Cholecalciferol (VITAMIN D3) 1.25 MG (50000 UT) TABS, Take 1 tablet by mouth daily., Disp: , Rfl:  ?  cholestyramine (QUESTRAN) 4 g packet, Take 4 g by mouth daily., Disp: , Rfl:  ?  citalopram (CELEXA) 10 MG tablet, Take 10 mg by mouth daily., Disp: , Rfl:  ?  Coenzyme Q10 (COQ-10) 400 MG CAPS, Take 1 capsule by mouth daily., Disp: , Rfl:  ?  Evolocumab with Infusor ([place]) 420 MG/3.5ML SOCT, Inject 1 Units into the skin every 28 (twenty-eight) days., Disp: 3.6 mL, Rfl: 12 ?  famotidine (PEPCID) 40 MG tablet, Take 40 mg by mouth daily., Disp: , Rfl:  ?  fenofibrate (TRICOR) 145 MG tablet, Take 145 mg by mouth daily., Disp: , Rfl:  ?  ferrous sulfate 325 (65 FE) MG EC tablet, Take 325 mg by mouth 2 (two) times daily., Disp: , Rfl:  ?  fluocinonide gel (LIDEX) [id number] %, Apply 1 application topically as needed., Disp: , Rfl:  ?  hydrocortisone 2.5 % cream, Apply 1 application topically as needed., Disp: , Rfl:  ?  KRILL OIL PO, Take 1 capsule by mouth daily., Disp: , Rfl:  ?  losartan (COZAAR) 50 MG tablet, Take 1 tablet (50 mg total) by mouth daily.,  Disp: 30 tablet, Rfl: 2 ?  LUTEIN PO, Take 1 capsule by mouth daily., Disp: , Rfl:  ?  Magnesium 500 MG CAPS, Take 1 capsule by mouth daily., Disp: , Rfl:  ?  melatonin 1 MG TABS tablet, Take 1 mg by mouth as needed., Disp: , Rfl:  ?  MULTIPLE VITAMIN PO, Take 1 tablet by mouth daily., Disp: , Rfl:  ?  nitroGLYCERIN (NITROSTAT) 0.4 MG SL tablet, Place 0.4 mg under the tongue every 5 (five) minutes as needed for chest pain., Disp: , Rfl:  ?  vitamin C (ASCORBIC ACID) 500 MG tablet, Take 500 mg by mouth daily., Disp: , Rfl:  ?  warfarin (COUMADIN) 10 MG tablet, Take 1 tablet (10 mg total) by mouth daily., Disp: 100 tablet, Rfl: 3 ?  warfarin (COUMADIN) 4 MG tablet, Take 1 tablet (4 mg total) by mouth daily.  Pt taking both 10 mg and 4 mg daily. Current weekly dose of 10 mg every Mon, Wed, Fri and 8 mg all other days. Being managed and followed closely through coumadin clinic, Disp: 100 tablet, Rfl: 2 ?Past Medical History:  ?Diagnosis Date  ? AF (paroxysmal atrial fibrillation) ([place])   ? Clotting disorder ([place])   ? Recurrent DVT and PE  ? Diabetes ([place])   ? Hypercholesteremia   ? Hypertension   ? ? ?ASSESSMENT ? ?Recent Results: ?The most recent result is correlated with 62 mg per week: ? ?Lab Results  ?Component Value Date  ? INR 2.1 01/27/2022  ? INR 2.4 12/30/2021  ? INR 2.7 12/16/2021  ? ? ?Anticoagulation Dosing: ?Description   ?INR at goal. Continue current weekly dose of 10 mg every Mon, Wed, Fri and 8 mg all other days. Recheck INR in 5 weeks.  ?  ?  ? ?INR today: Therapeutic. Continues to remain therapeutic on current weekly dose of 62 mg/week  Denies any complains of bleeding or bruising symptoms. Denies any other relevant changes in her diet, medications, or lifestyle. Will continue current weekly dose and continue close monitoring and follow up.  ? ? ?PLAN ?Weekly dose was unchanged by  0 % to 62 mg per week. Continue current weekly dose of 10 mg every Mon, Wed, Fri and 8 mg all other days. Recheck INR in 2 weeks.  ? ?Patient Instructions  ?INR at goal. Continue current weekly dose of 10 mg every Mon, Wed, Fri and 8 mg all other days. Recheck INR in 5 weeks.  ?Patient advised to contact clinic or seek medical attention if signs/symptoms of bleeding or thromboembolism occur. ? ?Patient verbalized understanding by repeating back information and was advised to contact me if further medication-related questions arise.  ? ?Follow-up ?Return in about 5 weeks (around 03/03/2022). ? ?Alysia Penna, PharmD ? ?15 minutes spent face-to-face with the patient during the encounter. 50% of time spent on education, including signs/sx bleeding and clotting, as well as food and drug interactions with warfarin. 50% of time was  spent on fingerprick POC INR sample collection,processing, results determination, and documentation ? ?

## 2022-01-27 NOTE — Patient Instructions (Signed)
INR at goal. Continue current weekly dose of 10 mg every Mon, Wed, Fri and 8 mg all other days. Recheck INR in 5 weeks.  ?

## 2022-01-28 ENCOUNTER — Telehealth: Payer: Self-pay | Admitting: Pharmacist

## 2022-01-28 LAB — BASIC METABOLIC PANEL
BUN/Creatinine Ratio: 17 (ref 10–24)
BUN: 25 mg/dL (ref 8–27)
CO2: 19 mmol/L — ABNORMAL LOW (ref 20–29)
Calcium: 10 mg/dL (ref 8.6–10.2)
Chloride: 103 mmol/L (ref 96–106)
Creatinine, Ser: 1.45 mg/dL — ABNORMAL HIGH (ref 0.76–1.27)
Glucose: 115 mg/dL — ABNORMAL HIGH (ref 70–99)
Potassium: 4.5 mmol/L (ref 3.5–5.2)
Sodium: 140 mmol/L (ref 134–144)
eGFR: 53 mL/min/{1.73_m2} — ABNORMAL LOW (ref >59)

## 2022-01-28 NOTE — Telephone Encounter (Signed)
Pt states that his Repatha Pushtronex was denies by insurance. Completed PA on pt's behalf with [employer]. PA approved from 12/15/21-01/26/22. Called pt's pharmacy and requested Rx to be reprocessed. Repatha Pushtronex approved through insurance for a co-pay of $47/month. Pt agreeable with the copay and okay with continuing with current therapy.  ?

## 2022-02-14 ENCOUNTER — Encounter (INDEPENDENT_AMBULATORY_CARE_PROVIDER_SITE_OTHER): Payer: Self-pay | Admitting: Family

## 2022-02-14 ENCOUNTER — Ambulatory Visit (HOSPITAL_BASED_OUTPATIENT_CLINIC_OR_DEPARTMENT_OTHER): Payer: Self-pay

## 2022-03-03 ENCOUNTER — Other Ambulatory Visit: Payer: Self-pay | Admitting: Pharmacist

## 2022-03-03 ENCOUNTER — Ambulatory Visit: Payer: Medicare Other | Admitting: Pharmacist

## 2022-03-03 DIAGNOSIS — I824Y2 Acute embolism and thrombosis of unspecified deep veins of left proximal lower extremity: Secondary | ICD-10-CM

## 2022-03-03 DIAGNOSIS — I480 Paroxysmal atrial fibrillation: Secondary | ICD-10-CM

## 2022-03-03 DIAGNOSIS — Z7901 Long term (current) use of anticoagulants: Secondary | ICD-10-CM

## 2022-03-03 DIAGNOSIS — Z5181 Encounter for therapeutic drug level monitoring: Secondary | ICD-10-CM

## 2022-03-03 DIAGNOSIS — N1831 Chronic kidney disease, stage 3a: Secondary | ICD-10-CM

## 2022-03-03 DIAGNOSIS — D6859 Other primary thrombophilia: Secondary | ICD-10-CM

## 2022-03-03 LAB — POCT INR: INR: 2.8 (ref 2.0–3.0)

## 2022-03-03 MED ORDER — LOSARTAN POTASSIUM 50 MG PO TABS: 50.0000 mg | ORAL_TABLET | Freq: Every day | ORAL | 3 refills | Status: DC

## 2022-03-03 NOTE — Progress Notes (Signed)
Anticoagulation Management ?Brian Cummings is a 66 y.o. male who reports to the clinic for monitoring of warfarin treatment.   ? ?Indication: atrial fibrillation, DVT, and PE CHA2DS2 Vasc Score 2 (Age 47-74, HTN hx), HAS-BLED 1  ?Duration: indefinite ?Supervising physician: Adrian Prows ? ?Anticoagulation Clinic Visit History: ? ?Pt using 10 mg tablets and 4 mg tablets at home. Hx of multiple prior DVT and PE. No identifiable hx of hypercoagulable markers. No labs available to confirm. Previously being managed through coumadin clinic in [place].  ? ?Patient does not report signs/symptoms of bleeding or thromboembolism  ? ?Other recent changes: No changes in diet, medications, lifestyle ? ?No noted changes  ? ?Anticoagulation Episode Summary   ? ? Current INR goal:  2.0-3.0  ?TTR:  100.0 % (2.7 mo)  ?Next INR check:  04/14/2022  ?INR from last check:  2.8 (03/03/2022)  ?Weekly max warfarin dose:    ?Target end date:    ?INR check location:    ?Preferred lab:    ?Send INR reminders to:    ? Indications   ?Primary hypercoagulable state ([place]) [D68.59] ?Long term (current) use of anticoagulants [Z79.01] ?Atrial fibrillation ([place]) [I48.91] ?Acute venous embolism and thrombosis of deep vessels of proximal lower extremity ([place]) [I82.4Y9] ? ?  ?  ? ? Comments:  Prior DVT and PE (x3), paroxysmal AFib, hypercoaguable state  ?  ? ?  ? ? ?Allergies  ?Allergen Reactions  ? Statins   ?  Muscle aches  ? ? ?Current Outpatient Medications:  ?  allopurinol (ZYLOPRIM) 100 MG tablet, Take 100 mg by mouth daily., Disp: , Rfl:  ?  amLODipine (NORVASC) 5 MG tablet, Take 1 tablet (5 mg total) by mouth daily., Disp: 90 tablet, Rfl: 3 ?  calcium carbonate (OSCAL) 1500 (600 Ca) MG TABS tablet, Take by mouth daily., Disp: , Rfl:  ?  Cholecalciferol (VITAMIN D3) 1.25 MG (50000 UT) TABS, Take 1 tablet by mouth daily., Disp: , Rfl:  ?  cholestyramine (QUESTRAN) 4 g packet, Take 4 g by mouth daily., Disp: , Rfl:  ?  citalopram (CELEXA) 10 MG tablet, Take 10 mg  by mouth daily., Disp: , Rfl:  ?  Coenzyme Q10 (COQ-10) 400 MG CAPS, Take 1 capsule by mouth daily., Disp: , Rfl:  ?  Evolocumab with Infusor ([place]) 420 MG/3.5ML SOCT, Inject 1 Units into the skin every 28 (twenty-eight) days., Disp: 3.6 mL, Rfl: 12 ?  famotidine (PEPCID) 40 MG tablet, Take 40 mg by mouth daily., Disp: , Rfl:  ?  fenofibrate (TRICOR) 145 MG tablet, Take 145 mg by mouth daily., Disp: , Rfl:  ?  ferrous sulfate 325 (65 FE) MG EC tablet, Take 325 mg by mouth 2 (two) times daily., Disp: , Rfl:  ?  fluocinonide gel (LIDEX) [id number] %, Apply 1 application topically as needed., Disp: , Rfl:  ?  hydrocortisone 2.5 % cream, Apply 1 application topically as needed., Disp: , Rfl:  ?  KRILL OIL PO, Take 1 capsule by mouth daily., Disp: , Rfl:  ?  losartan (COZAAR) 50 MG tablet, Take 1 tablet (50 mg total) by mouth daily., Disp: 90 tablet, Rfl: 3 ?  LUTEIN PO, Take 1 capsule by mouth daily., Disp: , Rfl:  ?  Magnesium 500 MG CAPS, Take 1 capsule by mouth daily., Disp: , Rfl:  ?  melatonin 1 MG TABS tablet, Take 1 mg by mouth as needed., Disp: , Rfl:  ?  MULTIPLE VITAMIN PO, Take 1 tablet by  mouth daily., Disp: , Rfl:  ?  nitroGLYCERIN (NITROSTAT) 0.4 MG SL tablet, Place 0.4 mg under the tongue every 5 (five) minutes as needed for chest pain., Disp: , Rfl:  ?  vitamin C (ASCORBIC ACID) 500 MG tablet, Take 500 mg by mouth daily., Disp: , Rfl:  ?  warfarin (COUMADIN) 10 MG tablet, Take 1 tablet (10 mg total) by mouth daily., Disp: 100 tablet, Rfl: 3 ?  warfarin (COUMADIN) 4 MG tablet, Take 1 tablet (4 mg total) by mouth daily. Pt taking both 10 mg and 4 mg daily. Current weekly dose of 10 mg every Mon, Wed, Fri and 8 mg all other days. Being managed and followed closely through coumadin clinic, Disp: 100 tablet, Rfl: 2 ?Past Medical History:  ?Diagnosis Date  ? AF (paroxysmal atrial fibrillation) ([place])   ? Clotting disorder ([place])   ? Recurrent DVT and PE  ? Diabetes ([place])   ? Hypercholesteremia   ?  Hypertension   ? ? ?ASSESSMENT ? ?Recent Results: ?The most recent result is correlated with 62 mg per week: ? ?Lab Results  ?Component Value Date  ? INR 2.8 03/03/2022  ? INR 2.1 01/27/2022  ? INR 2.4 12/30/2021  ? ? ?Anticoagulation Dosing: ?Description   ?INR at goal. Continue current weekly dose of 10 mg every Mon, Wed, Fri and 8 mg all other days. Recheck INR in 6 weeks.  ?  ?  ? ?INR today: Therapeutic. Continues to remain therapeutic on current weekly dose of 62 mg/week  Denies any complains of bleeding or bruising symptoms. Denies any other relevant changes in her diet, medications, or lifestyle. Will continue current weekly dose with no changes ? ?PLAN ?Weekly dose was unchanged by  0 % to 62 mg per week. Continue current weekly dose of 10 mg every Mon, Wed, Fri and 8 mg all other days. Recheck INR in 6 weeks.  ? ?Patient Instructions  ?INR at goal. Continue current weekly dose of 10 mg every Mon, Wed, Fri and 8 mg all other days. Recheck INR in 5 weeks.  ?Patient advised to contact clinic or seek medical attention if signs/symptoms of bleeding or thromboembolism occur. ? ?Patient verbalized understanding by repeating back information and was advised to contact me if further medication-related questions arise.  ? ?Follow-up ?Return in about 6 weeks (around 04/14/2022). ? ?Alysia Penna, PharmD ? ?15 minutes spent face-to-face with the patient during the encounter. 50% of time spent on education, including signs/sx bleeding and clotting, as well as food and drug interactions with warfarin. 50% of time was spent on fingerprick POC INR sample collection,processing, results determination, and documentation ? ?

## 2022-03-03 NOTE — Patient Instructions (Signed)
INR at goal. Continue current weekly dose of 10 mg every Mon, Wed, Fri and 8 mg all other days. Recheck INR in 5 weeks.  ?

## 2022-03-21 ENCOUNTER — Other Ambulatory Visit: Payer: Self-pay | Admitting: Cardiology

## 2022-03-21 DIAGNOSIS — Z7901 Long term (current) use of anticoagulants: Secondary | ICD-10-CM

## 2022-03-21 DIAGNOSIS — I480 Paroxysmal atrial fibrillation: Secondary | ICD-10-CM

## 2022-03-21 DIAGNOSIS — D6859 Other primary thrombophilia: Secondary | ICD-10-CM

## 2022-03-26 ENCOUNTER — Inpatient Hospital Stay (HOSPITAL_COMMUNITY)
Admission: EM | Admit: 2022-03-26 | Discharge: 2022-03-29 | DRG: 378 | Disposition: A | Discharge status: Home / Self Care | Payer: Medicare Other | Attending: Family Medicine | Admitting: Family Medicine

## 2022-03-26 DIAGNOSIS — Z9049 Acquired absence of other specified parts of digestive tract: Secondary | ICD-10-CM

## 2022-03-26 DIAGNOSIS — N1831 Chronic kidney disease, stage 3a: Secondary | ICD-10-CM | POA: Diagnosis present

## 2022-03-26 DIAGNOSIS — T45515A Adverse effect of anticoagulants, initial encounter: Secondary | ICD-10-CM

## 2022-03-26 DIAGNOSIS — K922 Gastrointestinal hemorrhage, unspecified: Secondary | ICD-10-CM

## 2022-03-26 DIAGNOSIS — N179 Acute kidney failure, unspecified: Secondary | ICD-10-CM

## 2022-03-26 DIAGNOSIS — K900 Celiac disease: Secondary | ICD-10-CM | POA: Diagnosis present

## 2022-03-26 DIAGNOSIS — D62 Acute posthemorrhagic anemia: Secondary | ICD-10-CM

## 2022-03-26 DIAGNOSIS — I2510 Atherosclerotic heart disease of native coronary artery without angina pectoris: Secondary | ICD-10-CM | POA: Diagnosis present

## 2022-03-26 DIAGNOSIS — Z79890 Hormone replacement therapy (postmenopausal): Secondary | ICD-10-CM

## 2022-03-26 DIAGNOSIS — R55 Syncope and collapse: Secondary | ICD-10-CM | POA: Diagnosis not present

## 2022-03-26 DIAGNOSIS — Y92002 Bathroom of unspecified non-institutional (private) residence single-family (private) house as the place of occurrence of the external cause: Secondary | ICD-10-CM

## 2022-03-26 DIAGNOSIS — Z6829 Body mass index (BMI) 29.0-29.9, adult: Secondary | ICD-10-CM

## 2022-03-26 DIAGNOSIS — K760 Fatty (change of) liver, not elsewhere classified: Secondary | ICD-10-CM | POA: Diagnosis present

## 2022-03-26 DIAGNOSIS — I952 Hypotension due to drugs: Secondary | ICD-10-CM | POA: Diagnosis present

## 2022-03-26 DIAGNOSIS — R1310 Dysphagia, unspecified: Secondary | ICD-10-CM | POA: Diagnosis present

## 2022-03-26 DIAGNOSIS — Z86711 Personal history of pulmonary embolism: Secondary | ICD-10-CM

## 2022-03-26 DIAGNOSIS — T467X5A Adverse effect of peripheral vasodilators, initial encounter: Secondary | ICD-10-CM | POA: Diagnosis present

## 2022-03-26 DIAGNOSIS — I129 Hypertensive chronic kidney disease with stage 1 through stage 4 chronic kidney disease, or unspecified chronic kidney disease: Secondary | ICD-10-CM | POA: Diagnosis present

## 2022-03-26 DIAGNOSIS — N401 Enlarged prostate with lower urinary tract symptoms: Secondary | ICD-10-CM | POA: Diagnosis present

## 2022-03-26 DIAGNOSIS — Z79899 Other long term (current) drug therapy: Secondary | ICD-10-CM

## 2022-03-26 DIAGNOSIS — Z7901 Long term (current) use of anticoagulants: Secondary | ICD-10-CM

## 2022-03-26 DIAGNOSIS — K5730 Diverticulosis of large intestine without perforation or abscess without bleeding: Secondary | ICD-10-CM

## 2022-03-26 DIAGNOSIS — D689 Coagulation defect, unspecified: Secondary | ICD-10-CM | POA: Diagnosis present

## 2022-03-26 DIAGNOSIS — N138 Other obstructive and reflux uropathy: Secondary | ICD-10-CM | POA: Diagnosis present

## 2022-03-26 DIAGNOSIS — K648 Other hemorrhoids: Secondary | ICD-10-CM

## 2022-03-26 DIAGNOSIS — E785 Hyperlipidemia, unspecified: Secondary | ICD-10-CM | POA: Diagnosis present

## 2022-03-26 DIAGNOSIS — E119 Type 2 diabetes mellitus without complications: Secondary | ICD-10-CM

## 2022-03-26 DIAGNOSIS — Z951 Presence of aortocoronary bypass graft: Secondary | ICD-10-CM

## 2022-03-26 DIAGNOSIS — K264 Chronic or unspecified duodenal ulcer with hemorrhage: Principal | ICD-10-CM | POA: Diagnosis present

## 2022-03-26 DIAGNOSIS — Z91018 Allergy to other foods: Secondary | ICD-10-CM

## 2022-03-26 DIAGNOSIS — I959 Hypotension, unspecified: Secondary | ICD-10-CM

## 2022-03-26 DIAGNOSIS — I480 Paroxysmal atrial fibrillation: Secondary | ICD-10-CM | POA: Diagnosis present

## 2022-03-26 DIAGNOSIS — R791 Abnormal coagulation profile: Secondary | ICD-10-CM

## 2022-03-26 DIAGNOSIS — Z20822 Contact with and (suspected) exposure to covid-19: Secondary | ICD-10-CM | POA: Diagnosis present

## 2022-03-26 DIAGNOSIS — K589 Irritable bowel syndrome without diarrhea: Secondary | ICD-10-CM

## 2022-03-26 DIAGNOSIS — I10 Essential (primary) hypertension: Secondary | ICD-10-CM | POA: Diagnosis present

## 2022-03-26 DIAGNOSIS — Z86718 Personal history of other venous thrombosis and embolism: Secondary | ICD-10-CM

## 2022-03-26 DIAGNOSIS — E1122 Type 2 diabetes mellitus with diabetic chronic kidney disease: Secondary | ICD-10-CM | POA: Diagnosis present

## 2022-03-26 DIAGNOSIS — D6832 Hemorrhagic disorder due to extrinsic circulating anticoagulants: Secondary | ICD-10-CM

## 2022-03-26 DIAGNOSIS — K269 Duodenal ulcer, unspecified as acute or chronic, without hemorrhage or perforation: Secondary | ICD-10-CM

## 2022-03-26 DIAGNOSIS — W228XXA Striking against or struck by other objects, initial encounter: Secondary | ICD-10-CM | POA: Diagnosis present

## 2022-03-26 DIAGNOSIS — K219 Gastro-esophageal reflux disease without esophagitis: Secondary | ICD-10-CM | POA: Diagnosis present

## 2022-03-26 DIAGNOSIS — K641 Second degree hemorrhoids: Secondary | ICD-10-CM

## 2022-03-26 DIAGNOSIS — Z713 Dietary counseling and surveillance: Secondary | ICD-10-CM

## 2022-03-26 DIAGNOSIS — Z888 Allergy status to other drugs, medicaments and biological substances status: Secondary | ICD-10-CM

## 2022-03-26 DIAGNOSIS — T50995A Adverse effect of other drugs, medicaments and biological substances, initial encounter: Secondary | ICD-10-CM | POA: Diagnosis present

## 2022-03-26 HISTORY — DX: Irritable bowel syndrome, unspecified: K58.9

## 2022-03-26 HISTORY — DX: Celiac disease: K90.0

## 2022-03-26 HISTORY — DX: Essential (primary) hypertension: I10

## 2022-03-26 HISTORY — DX: Disorder of kidney and ureter, unspecified: N28.9

## 2022-03-26 HISTORY — DX: Other pulmonary embolism without acute cor pulmonale: I26.99

## 2022-03-26 HISTORY — DX: Atherosclerotic heart disease of native coronary artery without angina pectoris: I25.10

## 2022-03-26 LAB — CBG MONITORING, ED: Glucose-Capillary: 175 mg/dL — ABNORMAL HIGH (ref 70–99)

## 2022-03-26 NOTE — ED Triage Notes (Signed)
Pt arrived from home with EMS as level 1 fall on coumadin. EMS called after pt had multiple syncopal episodes. Wife reported a fall and hit his head. Also reports bloody stools (hx of ibs). Took viagras x 3 at 1400 Initial bp 60/40, ns and 4mg  zofran given enroute with improvement of bp. Reports recent changed in medications.  ?

## 2022-03-27 ENCOUNTER — Emergency Department (HOSPITAL_COMMUNITY): Payer: Medicare Other

## 2022-03-27 ENCOUNTER — Other Ambulatory Visit: Payer: Self-pay

## 2022-03-27 ENCOUNTER — Encounter (HOSPITAL_COMMUNITY)
Admission: EM | Disposition: A | Discharge status: Home / Self Care | Payer: Self-pay | Source: Home / Self Care | Attending: Family Medicine

## 2022-03-27 ENCOUNTER — Inpatient Hospital Stay (HOSPITAL_COMMUNITY): Payer: Medicare Other | Admitting: Registered Nurse

## 2022-03-27 ENCOUNTER — Other Ambulatory Visit (HOSPITAL_COMMUNITY): Payer: Medicare Other

## 2022-03-27 ENCOUNTER — Encounter (HOSPITAL_COMMUNITY): Payer: Self-pay | Admitting: *Deleted

## 2022-03-27 ENCOUNTER — Observation Stay (HOSPITAL_COMMUNITY): Payer: Medicare Other

## 2022-03-27 DIAGNOSIS — I2510 Atherosclerotic heart disease of native coronary artery without angina pectoris: Secondary | ICD-10-CM

## 2022-03-27 DIAGNOSIS — E119 Type 2 diabetes mellitus without complications: Secondary | ICD-10-CM

## 2022-03-27 DIAGNOSIS — K264 Chronic or unspecified duodenal ulcer with hemorrhage: Secondary | ICD-10-CM | POA: Diagnosis present

## 2022-03-27 DIAGNOSIS — Y92002 Bathroom of unspecified non-institutional (private) residence single-family (private) house as the place of occurrence of the external cause: Secondary | ICD-10-CM | POA: Diagnosis not present

## 2022-03-27 DIAGNOSIS — K580 Irritable bowel syndrome with diarrhea: Secondary | ICD-10-CM

## 2022-03-27 DIAGNOSIS — K900 Celiac disease: Secondary | ICD-10-CM | POA: Diagnosis present

## 2022-03-27 DIAGNOSIS — T50995A Adverse effect of other drugs, medicaments and biological substances, initial encounter: Secondary | ICD-10-CM | POA: Diagnosis present

## 2022-03-27 DIAGNOSIS — R55 Syncope and collapse: Secondary | ICD-10-CM | POA: Diagnosis present

## 2022-03-27 DIAGNOSIS — Z20822 Contact with and (suspected) exposure to covid-19: Secondary | ICD-10-CM | POA: Diagnosis present

## 2022-03-27 DIAGNOSIS — I480 Paroxysmal atrial fibrillation: Secondary | ICD-10-CM

## 2022-03-27 DIAGNOSIS — I10 Essential (primary) hypertension: Secondary | ICD-10-CM

## 2022-03-27 DIAGNOSIS — Z951 Presence of aortocoronary bypass graft: Secondary | ICD-10-CM | POA: Diagnosis not present

## 2022-03-27 DIAGNOSIS — K5730 Diverticulosis of large intestine without perforation or abscess without bleeding: Secondary | ICD-10-CM | POA: Diagnosis present

## 2022-03-27 DIAGNOSIS — K5731 Diverticulosis of large intestine without perforation or abscess with bleeding: Secondary | ICD-10-CM | POA: Diagnosis not present

## 2022-03-27 DIAGNOSIS — K921 Melena: Secondary | ICD-10-CM | POA: Diagnosis not present

## 2022-03-27 DIAGNOSIS — W228XXA Striking against or struck by other objects, initial encounter: Secondary | ICD-10-CM | POA: Diagnosis present

## 2022-03-27 DIAGNOSIS — N401 Enlarged prostate with lower urinary tract symptoms: Secondary | ICD-10-CM

## 2022-03-27 DIAGNOSIS — D689 Coagulation defect, unspecified: Secondary | ICD-10-CM | POA: Diagnosis present

## 2022-03-27 DIAGNOSIS — K269 Duodenal ulcer, unspecified as acute or chronic, without hemorrhage or perforation: Secondary | ICD-10-CM

## 2022-03-27 DIAGNOSIS — K641 Second degree hemorrhoids: Secondary | ICD-10-CM | POA: Diagnosis present

## 2022-03-27 DIAGNOSIS — N1831 Chronic kidney disease, stage 3a: Secondary | ICD-10-CM

## 2022-03-27 DIAGNOSIS — I129 Hypertensive chronic kidney disease with stage 1 through stage 4 chronic kidney disease, or unspecified chronic kidney disease: Secondary | ICD-10-CM | POA: Diagnosis present

## 2022-03-27 DIAGNOSIS — K922 Gastrointestinal hemorrhage, unspecified: Secondary | ICD-10-CM

## 2022-03-27 DIAGNOSIS — I952 Hypotension due to drugs: Secondary | ICD-10-CM | POA: Diagnosis present

## 2022-03-27 DIAGNOSIS — D6832 Hemorrhagic disorder due to extrinsic circulating anticoagulants: Secondary | ICD-10-CM | POA: Diagnosis not present

## 2022-03-27 DIAGNOSIS — N179 Acute kidney failure, unspecified: Secondary | ICD-10-CM

## 2022-03-27 DIAGNOSIS — K589 Irritable bowel syndrome without diarrhea: Secondary | ICD-10-CM

## 2022-03-27 DIAGNOSIS — T467X5A Adverse effect of peripheral vasodilators, initial encounter: Secondary | ICD-10-CM | POA: Diagnosis present

## 2022-03-27 DIAGNOSIS — E785 Hyperlipidemia, unspecified: Secondary | ICD-10-CM | POA: Diagnosis present

## 2022-03-27 DIAGNOSIS — N138 Other obstructive and reflux uropathy: Secondary | ICD-10-CM | POA: Diagnosis present

## 2022-03-27 DIAGNOSIS — Z86711 Personal history of pulmonary embolism: Secondary | ICD-10-CM | POA: Diagnosis not present

## 2022-03-27 DIAGNOSIS — Z86718 Personal history of other venous thrombosis and embolism: Secondary | ICD-10-CM | POA: Diagnosis not present

## 2022-03-27 DIAGNOSIS — R791 Abnormal coagulation profile: Secondary | ICD-10-CM

## 2022-03-27 DIAGNOSIS — E1122 Type 2 diabetes mellitus with diabetic chronic kidney disease: Secondary | ICD-10-CM

## 2022-03-27 DIAGNOSIS — D62 Acute posthemorrhagic anemia: Secondary | ICD-10-CM | POA: Diagnosis present

## 2022-03-27 DIAGNOSIS — Z7901 Long term (current) use of anticoagulants: Secondary | ICD-10-CM | POA: Diagnosis not present

## 2022-03-27 DIAGNOSIS — K760 Fatty (change of) liver, not elsewhere classified: Secondary | ICD-10-CM | POA: Diagnosis present

## 2022-03-27 HISTORY — PX: ESOPHAGOGASTRODUODENOSCOPY (EGD) WITH PROPOFOL: SHX5813

## 2022-03-27 LAB — COMPREHENSIVE METABOLIC PANEL
ALT: 24 U/L (ref 0–44)
ALT: 24 U/L (ref 0–44)
AST: 18 U/L (ref 15–41)
AST: 25 U/L (ref 15–41)
Albumin: 3.3 g/dL — ABNORMAL LOW (ref 3.5–5.0)
Albumin: 3.3 g/dL — ABNORMAL LOW (ref 3.5–5.0)
Alkaline Phosphatase: 21 U/L — ABNORMAL LOW (ref 38–126)
Alkaline Phosphatase: 23 U/L — ABNORMAL LOW (ref 38–126)
Anion gap: 4 — ABNORMAL LOW (ref 5–15)
Anion gap: 7 (ref 5–15)
BUN: 36 mg/dL — ABNORMAL HIGH (ref 8–23)
BUN: 38 mg/dL — ABNORMAL HIGH (ref 8–23)
CO2: 21 mmol/L — ABNORMAL LOW (ref 22–32)
CO2: 22 mmol/L (ref 22–32)
Calcium: 8.4 mg/dL — ABNORMAL LOW (ref 8.9–10.3)
Calcium: 8.6 mg/dL — ABNORMAL LOW (ref 8.9–10.3)
Chloride: 109 mmol/L (ref 98–111)
Chloride: 109 mmol/L (ref 98–111)
Creatinine, Ser: 1.52 mg/dL — ABNORMAL HIGH (ref 0.61–1.24)
Creatinine, Ser: 1.72 mg/dL — ABNORMAL HIGH (ref 0.61–1.24)
GFR, Estimated: 43 mL/min — ABNORMAL LOW (ref >60)
GFR, Estimated: 50 mL/min — ABNORMAL LOW (ref >60)
Glucose, Bld: 177 mg/dL — ABNORMAL HIGH (ref 70–99)
Glucose, Bld: 205 mg/dL — ABNORMAL HIGH (ref 70–99)
Potassium: 4.7 mmol/L (ref 3.5–5.1)
Potassium: 5.3 mmol/L — ABNORMAL HIGH (ref 3.5–5.1)
Sodium: 135 mmol/L (ref 135–145)
Sodium: 137 mmol/L (ref 135–145)
Total Bilirubin: 0.5 mg/dL (ref 0.3–1.2)
Total Bilirubin: 0.7 mg/dL (ref 0.3–1.2)
Total Protein: 5.2 g/dL — ABNORMAL LOW (ref 6.5–8.1)
Total Protein: 5.3 g/dL — ABNORMAL LOW (ref 6.5–8.1)

## 2022-03-27 LAB — PROTIME-INR
INR: 4.8 (ref 0.8–1.2)
INR: 2.6 — ABNORMAL HIGH (ref 0.8–1.2)
INR: 1.3 — ABNORMAL HIGH (ref 0.8–1.2)
Prothrombin Time: 44.4 seconds — ABNORMAL HIGH (ref 11.4–15.2)
Prothrombin Time: 27.4 seconds — ABNORMAL HIGH (ref 11.4–15.2)
Prothrombin Time: 16.2 seconds — ABNORMAL HIGH (ref 11.4–15.2)

## 2022-03-27 LAB — URINALYSIS, ROUTINE W REFLEX MICROSCOPIC
Bilirubin Urine: NEGATIVE
Glucose, UA: NEGATIVE mg/dL
Hgb urine dipstick: NEGATIVE
Ketones, ur: NEGATIVE mg/dL
Leukocytes,Ua: NEGATIVE
Nitrite: NEGATIVE
Protein, ur: NEGATIVE mg/dL
Specific Gravity, Urine: 1.043 — ABNORMAL HIGH (ref 1.005–1.030)
pH: 5 (ref 5.0–8.0)

## 2022-03-27 LAB — ECHOCARDIOGRAM COMPLETE
Area-P 1/2: 3.65 cm2
Height: 75 in
S' Lateral: 2.65 cm
Weight: 3760 [oz_av]

## 2022-03-27 LAB — HEMOGLOBIN AND HEMATOCRIT, BLOOD
HCT: 32.3 % — ABNORMAL LOW (ref 39.0–52.0)
HCT: 27.5 % — ABNORMAL LOW (ref 39.0–52.0)
HCT: 27.8 % — ABNORMAL LOW (ref 39.0–52.0)
Hemoglobin: 11 g/dL — ABNORMAL LOW (ref 13.0–17.0)
Hemoglobin: 9.1 g/dL — ABNORMAL LOW (ref 13.0–17.0)
Hemoglobin: 9 g/dL — ABNORMAL LOW (ref 13.0–17.0)

## 2022-03-27 LAB — I-STAT CHEM 8, ED
BUN: 47 mg/dL — ABNORMAL HIGH (ref 8–23)
Calcium, Ion: 1.07 mmol/L — ABNORMAL LOW (ref 1.15–1.40)
Chloride: 106 mmol/L (ref 98–111)
Creatinine, Ser: 1.8 mg/dL — ABNORMAL HIGH (ref 0.61–1.24)
Glucose, Bld: 174 mg/dL — ABNORMAL HIGH (ref 70–99)
HCT: 33 % — ABNORMAL LOW (ref 39.0–52.0)
Hemoglobin: 11.2 g/dL — ABNORMAL LOW (ref 13.0–17.0)
Potassium: 4.8 mmol/L (ref 3.5–5.1)
Sodium: 138 mmol/L (ref 135–145)
TCO2: 23 mmol/L (ref 22–32)

## 2022-03-27 LAB — MAGNESIUM: Magnesium: 1.8 mg/dL (ref 1.7–2.4)

## 2022-03-27 LAB — TROPONIN I (HIGH SENSITIVITY)
Troponin I (High Sensitivity): 4 ng/L (ref <18)
Troponin I (High Sensitivity): 5 ng/L (ref <18)

## 2022-03-27 LAB — CBG MONITORING, ED
Glucose-Capillary: 169 mg/dL — ABNORMAL HIGH (ref 70–99)
Glucose-Capillary: 168 mg/dL — ABNORMAL HIGH (ref 70–99)

## 2022-03-27 LAB — TYPE AND SCREEN
ABO/RH(D): A POS
Antibody Screen: NEGATIVE

## 2022-03-27 LAB — CBC
HCT: 34.8 % — ABNORMAL LOW (ref 39.0–52.0)
Hemoglobin: 11.4 g/dL — ABNORMAL LOW (ref 13.0–17.0)
MCH: 29.6 pg (ref 26.0–34.0)
MCHC: 32.8 g/dL (ref 30.0–36.0)
MCV: 90.4 fL (ref 80.0–100.0)
Platelets: 178 10*3/uL (ref 150–400)
RBC: 3.85 MIL/uL — ABNORMAL LOW (ref 4.22–5.81)
RDW: 14.3 % (ref 11.5–15.5)
WBC: 6.9 10*3/uL (ref 4.0–10.5)
nRBC: 0 % (ref 0.0–0.2)

## 2022-03-27 LAB — ABO/RH: ABO/RH(D): A POS

## 2022-03-27 LAB — RESP PANEL BY RT-PCR (FLU A&B, COVID) ARPGX2
Influenza A by PCR: NEGATIVE
Influenza B by PCR: NEGATIVE
SARS Coronavirus 2 by RT PCR: NEGATIVE

## 2022-03-27 LAB — HEMOGLOBIN A1C
Hgb A1c MFr Bld: 7.3 % — ABNORMAL HIGH (ref 4.8–5.6)
Mean Plasma Glucose: 162.81 mg/dL

## 2022-03-27 LAB — LACTIC ACID, PLASMA: Lactic Acid, Venous: 1.9 mmol/L (ref 0.5–1.9)

## 2022-03-27 LAB — SAMPLE TO BLOOD BANK

## 2022-03-27 LAB — HIV ANTIBODY (ROUTINE TESTING W REFLEX): HIV Screen 4th Generation wRfx: NONREACTIVE

## 2022-03-27 LAB — GLUCOSE, CAPILLARY: Glucose-Capillary: 164 mg/dL — ABNORMAL HIGH (ref 70–99)

## 2022-03-27 SURGERY — ESOPHAGOGASTRODUODENOSCOPY (EGD) WITH PROPOFOL
Anesthesia: Monitor Anesthesia Care

## 2022-03-27 MED ORDER — LIDOCAINE 2% (20 MG/ML) 5 ML SYRINGE: INTRAMUSCULAR | Status: DC | PRN

## 2022-03-27 MED ORDER — INSULIN ASPART 100 UNIT/ML IJ SOLN
0.0000 [IU] | Freq: Three times a day (TID) | INTRAMUSCULAR | Status: DC
  Administered 2022-03-27 – 2022-03-28 (×3): 2 [IU] via SUBCUTANEOUS
  Administered 2022-03-28: 1 [IU] via SUBCUTANEOUS
  Administered 2022-03-28: 2 [IU] via SUBCUTANEOUS
  Administered 2022-03-29: 1 [IU] via SUBCUTANEOUS

## 2022-03-27 MED ORDER — SODIUM CHLORIDE 0.9% IV SOLUTION: Freq: Once | INTRAVENOUS | Status: AC

## 2022-03-27 MED ORDER — PERFLUTREN LIPID MICROSPHERE
1.0000 mL | INTRAVENOUS | Status: AC | PRN
  Administered 2022-03-27: 4 mL via INTRAVENOUS
  Filled 2022-03-27: qty 10

## 2022-03-27 MED ORDER — VITAMIN K1 10 MG/ML IJ SOLN
10.0000 mg | INTRAVENOUS | Status: AC
  Administered 2022-03-27: 10 mg via INTRAVENOUS
  Filled 2022-03-27: qty 1

## 2022-03-27 MED ORDER — ALLOPURINOL 100 MG PO TABS
100.0000 mg | ORAL_TABLET | Freq: Every day | ORAL | Status: DC
Start: 2022-03-27 — End: 2022-03-29
  Administered 2022-03-27 – 2022-03-28 (×2): 100 mg via ORAL
  Filled 2022-03-27 (×2): qty 1

## 2022-03-27 MED ORDER — ACETAMINOPHEN 650 MG RE SUPP: 650.0000 mg | Freq: Four times a day (QID) | RECTAL | Status: DC | PRN

## 2022-03-27 MED ORDER — POLYVINYL ALCOHOL 1.4 % OP SOLN
1.0000 [drp] | OPHTHALMIC | Status: DC | PRN
  Filled 2022-03-27 (×2): qty 15

## 2022-03-27 MED ORDER — SODIUM CHLORIDE 0.9 % IV BOLUS
250.0000 mL | Freq: Once | INTRAVENOUS | Status: AC
  Administered 2022-03-27: 250 mL via INTRAVENOUS

## 2022-03-27 MED ORDER — CITALOPRAM HYDROBROMIDE 10 MG PO TABS
10.0000 mg | ORAL_TABLET | Freq: Every day | ORAL | Status: DC
  Administered 2022-03-27 – 2022-03-28 (×2): 10 mg via ORAL
  Filled 2022-03-27 (×3): qty 1

## 2022-03-27 MED ORDER — PROPOFOL 10 MG/ML IV BOLUS
INTRAVENOUS | Status: DC | PRN
  Administered 2022-03-27: 100 mg via INTRAVENOUS

## 2022-03-27 MED ORDER — LACTATED RINGERS IV SOLN: INTRAVENOUS | Status: DC | PRN

## 2022-03-27 MED ORDER — PROTHROMBIN COMPLEX CONC HUMAN 500 UNITS IV KIT
2550.0000 [IU] | PACK | Status: AC
  Administered 2022-03-27: 2550 [IU] via INTRAVENOUS
  Filled 2022-03-27: qty 2550

## 2022-03-27 MED ORDER — INSULIN ASPART 100 UNIT/ML IJ SOLN: 0.0000 [IU] | Freq: Every day | INTRAMUSCULAR | Status: DC

## 2022-03-27 MED ORDER — SODIUM CHLORIDE 0.9 % IV BOLUS
500.0000 mL | Freq: Once | INTRAVENOUS | Status: AC
  Administered 2022-03-27: 500 mL via INTRAVENOUS

## 2022-03-27 MED ORDER — SODIUM CHLORIDE 0.9 % IV SOLN: INTRAVENOUS | Status: DC

## 2022-03-27 MED ORDER — LACTATED RINGERS IV SOLN: INTRAVENOUS | Status: AC

## 2022-03-27 MED ORDER — ONDANSETRON HCL 4 MG/2ML IJ SOLN: 4.0000 mg | Freq: Four times a day (QID) | INTRAMUSCULAR | Status: DC | PRN

## 2022-03-27 MED ORDER — EPINEPHRINE HCL 5 MG/250ML IV SOLN IN NS: 0.5000 ug/min | INTRAVENOUS | Status: DC

## 2022-03-27 MED ORDER — PROPOFOL 500 MG/50ML IV EMUL
INTRAVENOUS | Status: DC | PRN
Start: 2022-03-27 — End: 2022-03-27
  Administered 2022-03-27: 150 ug/kg/min via INTRAVENOUS

## 2022-03-27 MED ORDER — PANTOPRAZOLE SODIUM 40 MG IV SOLR
40.0000 mg | Freq: Two times a day (BID) | INTRAVENOUS | Status: DC
  Administered 2022-03-27: 40 mg via INTRAVENOUS
  Filled 2022-03-27: qty 10

## 2022-03-27 MED ORDER — IOHEXOL 350 MG/ML SOLN
80.0000 mL | Freq: Once | INTRAVENOUS | Status: AC | PRN
  Administered 2022-03-27: 80 mL via INTRAVENOUS

## 2022-03-27 MED ORDER — EPINEPHRINE 0.1 MG/10ML (10 MCG/ML) SYRINGE FOR IV PUSH (FOR BLOOD PRESSURE SUPPORT)
50.0000 ug | PREFILLED_SYRINGE | Freq: Once | INTRAVENOUS | Status: DC
  Filled 2022-03-27: qty 10

## 2022-03-27 MED ORDER — ONDANSETRON HCL 4 MG PO TABS: 4.0000 mg | ORAL_TABLET | Freq: Four times a day (QID) | ORAL | Status: DC | PRN

## 2022-03-27 MED ORDER — ACETAMINOPHEN 325 MG PO TABS
650.0000 mg | ORAL_TABLET | Freq: Four times a day (QID) | ORAL | Status: DC | PRN
  Filled 2022-03-27: qty 2

## 2022-03-27 SURGICAL SUPPLY — 15 items

## 2022-03-27 NOTE — Subjective & Objective (Signed)
CC: syncope, GI bleeding ?HPI: ?66 year old white male history of coronary disease status post CABG, paroxysmal atrial fibrillation, history of DVT on Coumadin, CKD stage IIIa baseline creatinine 1.4, diet-controlled diabetes, history of celiac disease/IBS, BPH, hypertension who presents to the ER today with multiple episodes of syncope while at home.  Patient states that he was not feeling well today.  However he decided to take 60 mg of Viagra this evening around 5 PM.  Around 9:00 this evening, while going to the bathroom, he had a syncopal episode.  He states that while he was in the bathroom he had a bloody stool.  After he got up from the bathroom, he collapsed.  He hit his head against a metal cabinet.  His significant other was with him.  He managed to crawl to the living room.  He then had a another bloody bowel movement and passed out.  EMS was called.  While they were getting him into a wheelchair, he passed out again.  Reportedly his systolic blood pressure was in the 60s. ? ?Patient reports that he has had recent changes to his medications.  [facility] was seen by [facility] a couple weeks ago.  He was started on Rapaflo for his BPH.  Patient states that Flomax in the past this caused excessive hypotension. ? ?On arrival to the ER, temp 98.4 heart rate 72 blood pressure 82/64. ? ? ?He was given 750 cc of IV fluids. ? ?Labs: ?INR 4.8 ? ?BUN of 38 creatinine 1.72 ? ?Lactic acid 1.9 ? ?Hemoglobin 11.4, platelets 178 ? ?Due to the patient's fall and syncope, trauma service was called.  There is no acute injury. ? ?CT head chest abdomen pelvis and cervical spine were all negative for acute processes. ? ?[facility] hospitalist contacted for admission. ?

## 2022-03-27 NOTE — Progress Notes (Signed)
*  PRELIMINARY RESULTS* ?Echocardiogram ?2D Echocardiogram has been performed with Definity. ? ?Stacey Drain ?03/27/2022, 3:47 PM ?

## 2022-03-27 NOTE — Progress Notes (Signed)
?  03/26/22 2336  ?Clinical Encounter Type  ?Visited With Patient;Health care provider ?Marland KitchenLoran Senters, RN)  ?Visit Type ED;Trauma;Initial;Spiritual support ?(Level 2 Trauma)  ?Referral From Nurse ?Marland KitchenLoran Senters, RN)  ?Consult/Referral To Chaplain ?Albertina Parr [place])  ?Spiritual Encounters  ?Spiritual Needs Emotional;Prayer  ? ?Responded to page in E.D. Trauma Room C for Level 1 Trauma. Met Mr. Brian Cummings at patient's bedside. No family present at this time. Patient stated that his family is all in [place]. Provided meaningful presence and offered patient hospitality. [name] stated that he is [place] and requested prayer. At patient's request and at his request we prayed for comfort and healing. ?Staff will page Chaplain upon request of patient or family.  ?Chaplain Abisai Coble, M.Min., [phone].   ?

## 2022-03-27 NOTE — Assessment & Plan Note (Signed)
Diet controlled.  Check A1c.

## 2022-03-27 NOTE — Anesthesia Preprocedure Evaluation (Signed)
Anesthesia Evaluation  ?Patient identified by MRN, date of birth, ID band ?Patient awake ? ? ? ?Reviewed: ?Allergy & Precautions, NPO status , Patient's Chart, lab work & pertinent test results ? ?Airway ?Mallampati: IV ? ?TM Distance: >3 FB ?Neck ROM: Full ? ? ? Dental ? ?(+) Teeth Intact, Dental Advisory Given ?  ?Pulmonary ?PE (chronic coumadin for DVT/PE and Afib) ?Snores occasionally, has never had sleep study ?  ?Pulmonary exam normal ?breath sounds clear to auscultation ? ? ? ? ? ? Cardiovascular ?hypertension, Pt. on medications ?+ CAD, + CABG and + DVT  ?Normal cardiovascular exam+ dysrhythmias Atrial Fibrillation  ?Rhythm:Regular Rate:Normal ? ? ?  ?Neuro/Psych ?PSYCHIATRIC DISORDERS Depression negative neurological ROS ?   ? GI/Hepatic ?Neg liver ROS, GERD  Controlled,  ?Endo/Other  ?diabetes, Well ControlledDiet controlled  ? ? Renal/GU ?Renal InsufficiencyRenal diseaseCKD 3, Cr 1.52  ?negative genitourinary ?  ?Musculoskeletal ?negative musculoskeletal ROS ?(+)  ? Abdominal ?  ?Peds ?negative pediatric ROS ?(+)  Hematology ? ?(+) Blood dyscrasia, anemia , Hb 11 this AM, plt 178 ?Last INR 2.6   ?Anesthesia Other Findings ? ? Reproductive/Obstetrics ?negative OB ROS ? ?  ? ? ? ? ? ? ? ? ? ? ? ? ? ?  ?  ? ? ? ? ? ? ? ? ?Anesthesia Physical ?Anesthesia Plan ? ?ASA: 3 ? ?Anesthesia Plan: MAC  ? ?Post-op Pain Management:   ? ?Induction:  ? ?PONV Risk Score and Plan: 2 and Propofol infusion and TIVA ? ?Airway Management Planned: Natural Airway and Simple Face Mask ? ?Additional Equipment: None ? ?Intra-op Plan:  ? ?Post-operative Plan:  ? ?Informed Consent: I have reviewed the patients History and Physical, chart, labs and discussed the procedure including the risks, benefits and alternatives for the proposed anesthesia with the patient or authorized representative who has indicated his/her understanding and acceptance.  ? ? ? ? ? ?Plan Discussed with: CRNA ? ?Anesthesia Plan  Comments:   ? ? ? ? ? ? ?Anesthesia Quick Evaluation ? ?

## 2022-03-27 NOTE — Transfer of Care (Signed)
Immediate Anesthesia Transfer of Care Note ? ?Patient: Brian Cummings ? ?Procedure(s) Performed: ESOPHAGOGASTRODUODENOSCOPY (EGD) WITH PROPOFOL ? ?Patient Location: PACU ? ?Anesthesia Type:General ? ?Level of Consciousness: awake, alert  and patient cooperative ? ?Airway & Oxygen Therapy: Patient Spontanous Breathing and Patient connected to nasal cannula oxygen ? ?Post-op Assessment: Report given to RN, Post -op Vital signs reviewed and stable and Patient moving all extremities X 4 ? ?Post vital signs: Reviewed and stable ? ?Last Vitals:  ?Vitals Value Taken Time  ?BP    ?Temp    ?Pulse 78 03/27/22 1641  ?Resp 23 03/27/22 1641  ?SpO2 97 % 03/27/22 1641  ?Vitals shown include unvalidated device data. ? ?Last Pain:  ?Vitals:  ? 03/27/22 1546  ?TempSrc: Temporal  ?PainSc: 0-No pain  ?   ? ?  ? ?Complications: No notable events documented. ?

## 2022-03-27 NOTE — Consult Note (Addendum)
? ? ?Consultation ? ?Referring Provider: [facility]/ Pahwani ?Primary Care Physician:  [facility] [place] ?Primary Gastroenterologist:  unassigned - prior  GI in [place] ? ?Reason for Consultation:  GI bleed  with syncope ? ?HPI: Brian Cummings is a 66 y.o. male, with multiple medical problems, who was brought to the emergency room about 10 hours ago via EMS after he had a syncopal episode while on the commode at which time he passed a grossly bloody stool.  (Had also taken Viagra last p.m.) Apparently he crawled into the living room and then had another syncopal episode.  Initial blood pressure on arrival to the ER 82/64 ?Patient has not had any prior episodes of GI bleeding.  He is on chronic Coumadin with history of atrial fibrillation and recurrent DVT/PE.  He has history of coronary artery disease, status post CABG 2018, chronic kidney disease stage III, diabetes mellitus, status postcholecystectomy, and has history of celiac disease. ? ?He did have a gastroenterologist when living in [place] and had undergone endoscopic evaluation in September 2021 with EGD showing grade B esophagitis, he had a distal Schatzki's ring which was dilated with 39 [place], and noted small hiatal hernia.  Colonoscopy at that same setting with subtle chronic colitis nonspecific and moderate diverticulosis, small internal hemorrhoids.  Apices of the esophagus showed reactive inflammatory changes secondary to reflux, random colon biopsies showed mild nonspecific chronic inflammation no evidence of lymphocytic or collagenous colitis.  No duodenal biopsies ? ?.  On arrival hemoglobin 11.4/hematocrit 34.8, down from baseline of 12.21 August 2021. ?Most recent hemoglobin early this a.m. 11.0. ? ?INR was 4.8, he has received 2 units of FFP> 2.6 ? ?BUN 38/creatinine 1.72 ?lactate within normal limits/troponin within normal limits. ? ?Patient says that a couple of months ago he did have 1 reddish appearing bowel movement, had not had any  recurrence until Friday evening when he passed a reddish looking bowel movement.  He says he did not pay too much attention to that. ?Last evening had the 2 grossly bloody bowel movements as above.  Since arrival to the ER he has had 2 large more melenic appearing bowel movements. ? ?Last dose of Coumadin was yesterday, he is on chronic iron at home. ?CT of the chest abdomen and pelvis with contrast here unremarkable, noted fatty liver. ?Patient says he has history of severe GERD, used to be on Protonix which controlled his symptoms well however due to his chronic kidney disease the Protonix has been stopped and he takes famotidine currently which does not work as well.  He has noticed some recent mild odynophagia in the distal esophagus. ? ?No nausea or vomiting or abdominal pain associated with current episode of bleeding. ? ? ? ?Past Medical History:  ?Diagnosis Date  ? Celiac disease   ? Coronary artery disease   ? Hypertension   ? IBS (irritable bowel syndrome)   ? Pulmonary embolism ([place])   ? Renal disorder   ? ? ?Past Surgical History:  ?Procedure Laterality Date  ? CHOLECYSTECTOMY    ? CORONARY ARTERY BYPASS GRAFT    ? ? ?Prior to Admission medications   ?Medication Sig Start Date End Date Taking? Authorizing Provider  ?allopurinol (ZYLOPRIM) 100 MG tablet Take 100 mg by mouth at bedtime. 02/09/22  Yes [provider]  ?amLODipine (NORVASC) 5 MG tablet Take 5 mg by mouth at bedtime. 12/30/21  Yes [provider]  ?CALCIUM-VITAMIN D PO Take 1 tablet by mouth at bedtime.   Yes [provider]  ?Cholecalciferol (VITAMIN D3 PO) Take 1 tablet by mouth at bedtime. Unsure of dose   Yes [provider]  ?citalopram (CELEXA) 10 MG tablet Take 10 mg by mouth daily.   Yes [provider]  ?famotidine (PEPCID) 40 MG tablet Take 40 mg by mouth 2 (two) times daily.   Yes [provider]  ?ferrous sulfate 325 (65 FE) MG tablet Take 325 mg by mouth 2 (two) times daily with  a meal.   Yes [provider]  ?fluocinonide cream (LIDEX) [id number] % Apply 1 application. topically daily as needed (dry patches on ear).   Yes [provider]  ?losartan (COZAAR) 50 MG tablet Take 50 mg by mouth daily. 03/21/22  Yes [provider]  ?melatonin 1 MG TABS tablet Take 1 mg by mouth at bedtime.   Yes [provider]  ?Multiple Vitamins-Minerals (MULTIVITAMIN ADULT) CHEW Chew 1 tablet by mouth daily.   Yes [provider]  ?nitroGLYCERIN (NITROSTAT) 0.4 MG SL tablet Place 0.4 mg under the tongue every 5 (five) minutes as needed for chest pain.   Yes [provider]  ?Polyethyl Glycol-Propyl Glycol (SYSTANE OP) Place 1 drop into both eyes in the morning and at bedtime.   Yes [provider]  ?[place] 420 MG/3.5ML SOCT Inject 420 mg into the skin every 28 (twenty-eight) days. 03/24/22  Yes [provider]  ?sildenafil (REVATIO) 20 MG tablet Take 40-100 mg by mouth daily as needed (ED). 02/18/22  Yes [provider]  ?silodosin (RAPAFLO) 4 MG CAPS capsule Take 4 mg by mouth at bedtime. 03/18/22  Yes [provider]  ?vitamin C (ASCORBIC ACID) 500 MG tablet Take 500 mg by mouth daily.   Yes [provider]  ?warfarin (COUMADIN) 10 MG tablet Take 10 mg by mouth every Monday, Wednesday, and Friday. 01/13/22  Yes [provider]  ?warfarin (COUMADIN) 4 MG tablet Take 8 mg by mouth See admin instructions. Sunday,Tuesday,Thursday,saturday 02/13/22  Yes [provider]  ? ? ?Current Facility-Administered Medications  ?Medication Dose Route Frequency Provider Last Rate Last Admin  ? acetaminophen (TYLENOL) tablet 650 mg  650 mg Oral Q6H PRN Kristopher Oppenheim, DO      ? Or  ? acetaminophen (TYLENOL) suppository 650 mg  650 mg Rectal Q6H PRN Kristopher Oppenheim, DO      ? allopurinol (ZYLOPRIM) tablet 100 mg  100 mg Oral QHS Kristopher Oppenheim, DO      ? citalopram (CELEXA) tablet 10 mg  10 mg Oral Daily Kristopher Oppenheim, DO    10 mg at 03/27/22 1026  ? insulin aspart (novoLOG) injection 0-5 Units  0-5 Units Subcutaneous QHS Kristopher Oppenheim, DO      ? insulin aspart (novoLOG) injection 0-9 Units  0-9 Units Subcutaneous TID WC Kristopher Oppenheim, DO   2 Units at 03/27/22 0746  ? lactated ringers infusion   Intravenous Continuous Kristopher Oppenheim, DO 100 mL/hr at 03/27/22 0331 New Bag at 03/27/22 0331  ? ondansetron (ZOFRAN) tablet 4 mg  4 mg Oral Q6H PRN Kristopher Oppenheim, DO      ? Or  ? ondansetron (ZOFRAN) injection 4 mg  4 mg Intravenous Q6H PRN Kristopher Oppenheim, DO      ? pantoprazole (PROTONIX) injection 40 mg  40 mg Intravenous Q12H Esterwood, Amy S, PA-C      ? ?Current Outpatient Medications  ?Medication Sig Dispense Refill  ? allopurinol (ZYLOPRIM) 100 MG tablet Take 100 mg by mouth at bedtime.    ?  amLODipine (NORVASC) 5 MG tablet Take 5 mg by mouth at bedtime.    ? CALCIUM-VITAMIN D PO Take 1 tablet by mouth at bedtime.    ? Cholecalciferol (VITAMIN D3 PO) Take 1 tablet by mouth at bedtime. Unsure of dose    ? citalopram (CELEXA) 10 MG tablet Take 10 mg by mouth daily.    ? famotidine (PEPCID) 40 MG tablet Take 40 mg by mouth 2 (two) times daily.    ? ferrous sulfate 325 (65 FE) MG tablet Take 325 mg by mouth 2 (two) times daily with a meal.    ? fluocinonide cream (LIDEX) [id number] % Apply 1 application. topically daily as needed (dry patches on ear).    ? losartan (COZAAR) 50 MG tablet Take 50 mg by mouth daily.    ? melatonin 1 MG TABS tablet Take 1 mg by mouth at bedtime.    ? Multiple Vitamins-Minerals (MULTIVITAMIN ADULT) CHEW Chew 1 tablet by mouth daily.    ? nitroGLYCERIN (NITROSTAT) 0.4 MG SL tablet Place 0.4 mg under the tongue every 5 (five) minutes as needed for chest pain.    ? Polyethyl Glycol-Propyl Glycol (SYSTANE OP) Place 1 drop into both eyes in the morning and at bedtime.    ? [place] 420 MG/3.5ML SOCT Inject 420 mg into the skin every 28 (twenty-eight) days.    ? sildenafil (REVATIO) 20 MG tablet Take 40-100 mg by mouth daily  as needed (ED).    ? silodosin (RAPAFLO) 4 MG CAPS capsule Take 4 mg by mouth at bedtime.    ? vitamin C (ASCORBIC ACID) 500 MG tablet Take 500 mg by mouth daily.    ? warfarin (COUMADIN) 10 MG tabl

## 2022-03-27 NOTE — H&P (View-Only) (Signed)
? ? ?Consultation ? ?Referring Provider: [facility]/ Pahwani ?Primary Care Physician:  [facility] [place] ?Primary Gastroenterologist:  unassigned - prior  GI in [place] ? ?Reason for Consultation:  GI bleed  with syncope ? ?HPI: Brian Cummings is a 66 y.o. male, with multiple medical problems, who was brought to the emergency room about 10 hours ago via EMS after he had a syncopal episode while on the commode at which time he passed a grossly bloody stool.  (Had also taken Viagra last p.m.) Apparently he crawled into the living room and then had another syncopal episode.  Initial blood pressure on arrival to the ER 82/64 ?Patient has not had any prior episodes of GI bleeding.  He is on chronic Coumadin with history of atrial fibrillation and recurrent DVT/PE.  He has history of coronary artery disease, status post CABG 2018, chronic kidney disease stage III, diabetes mellitus, status postcholecystectomy, and has history of celiac disease. ? ?He did have a gastroenterologist when living in [place] and had undergone endoscopic evaluation in September 2021 with EGD showing grade B esophagitis, he had a distal Schatzki's ring which was dilated with 28 [place], and noted small hiatal hernia.  Colonoscopy at that same setting with subtle chronic colitis nonspecific and moderate diverticulosis, small internal hemorrhoids.  Apices of the esophagus showed reactive inflammatory changes secondary to reflux, random colon biopsies showed mild nonspecific chronic inflammation no evidence of lymphocytic or collagenous colitis.  No duodenal biopsies ? ?.  On arrival hemoglobin 11.4/hematocrit 34.8, down from baseline of 12.21 August 2021. ?Most recent hemoglobin early this a.m. 11.0. ? ?INR was 4.8, he has received 2 units of FFP> 2.6 ? ?BUN 38/creatinine 1.72 ?lactate within normal limits/troponin within normal limits. ? ?Patient says that a couple of months ago he did have 1 reddish appearing bowel movement, had not had any  recurrence until Friday evening when he passed a reddish looking bowel movement.  He says he did not pay too much attention to that. ?Last evening had the 2 grossly bloody bowel movements as above.  Since arrival to the ER he has had 2 large more melenic appearing bowel movements. ? ?Last dose of Coumadin was yesterday, he is on chronic iron at home. ?CT of the chest abdomen and pelvis with contrast here unremarkable, noted fatty liver. ?Patient says he has history of severe GERD, used to be on Protonix which controlled his symptoms well however due to his chronic kidney disease the Protonix has been stopped and he takes famotidine currently which does not work as well.  He has noticed some recent mild odynophagia in the distal esophagus. ? ?No nausea or vomiting or abdominal pain associated with current episode of bleeding. ? ? ? ?Past Medical History:  ?Diagnosis Date  ? Celiac disease   ? Coronary artery disease   ? Hypertension   ? IBS (irritable bowel syndrome)   ? Pulmonary embolism ([place])   ? Renal disorder   ? ? ?Past Surgical History:  ?Procedure Laterality Date  ? CHOLECYSTECTOMY    ? CORONARY ARTERY BYPASS GRAFT    ? ? ?Prior to Admission medications   ?Medication Sig Start Date End Date Taking? Authorizing Provider  ?allopurinol (ZYLOPRIM) 100 MG tablet Take 100 mg by mouth at bedtime. 02/09/22  Yes [provider]  ?amLODipine (NORVASC) 5 MG tablet Take 5 mg by mouth at bedtime. 12/30/21  Yes [provider]  ?CALCIUM-VITAMIN D PO Take 1 tablet by mouth at bedtime.   Yes [provider]  ?Cholecalciferol (VITAMIN D3 PO) Take 1 tablet by mouth at bedtime. Unsure of dose   Yes [provider]  ?citalopram (CELEXA) 10 MG tablet Take 10 mg by mouth daily.   Yes [provider]  ?famotidine (PEPCID) 40 MG tablet Take 40 mg by mouth 2 (two) times daily.   Yes [provider]  ?ferrous sulfate 325 (65 FE) MG tablet Take 325 mg by mouth 2 (two) times daily with  a meal.   Yes [provider]  ?fluocinonide cream (LIDEX) [id number] % Apply 1 application. topically daily as needed (dry patches on ear).   Yes [provider]  ?losartan (COZAAR) 50 MG tablet Take 50 mg by mouth daily. 03/21/22  Yes [provider]  ?melatonin 1 MG TABS tablet Take 1 mg by mouth at bedtime.   Yes [provider]  ?Multiple Vitamins-Minerals (MULTIVITAMIN ADULT) CHEW Chew 1 tablet by mouth daily.   Yes [provider]  ?nitroGLYCERIN (NITROSTAT) 0.4 MG SL tablet Place 0.4 mg under the tongue every 5 (five) minutes as needed for chest pain.   Yes [provider]  ?Polyethyl Glycol-Propyl Glycol (SYSTANE OP) Place 1 drop into both eyes in the morning and at bedtime.   Yes [provider]  ?[place] 420 MG/3.5ML SOCT Inject 420 mg into the skin every 28 (twenty-eight) days. 03/24/22  Yes [provider]  ?sildenafil (REVATIO) 20 MG tablet Take 40-100 mg by mouth daily as needed (ED). 02/18/22  Yes [provider]  ?silodosin (RAPAFLO) 4 MG CAPS capsule Take 4 mg by mouth at bedtime. 03/18/22  Yes [provider]  ?vitamin C (ASCORBIC ACID) 500 MG tablet Take 500 mg by mouth daily.   Yes [provider]  ?warfarin (COUMADIN) 10 MG tablet Take 10 mg by mouth every Monday, Wednesday, and Friday. 01/13/22  Yes [provider]  ?warfarin (COUMADIN) 4 MG tablet Take 8 mg by mouth See admin instructions. Sunday,Tuesday,Thursday,saturday 02/13/22  Yes [provider]  ? ? ?Current Facility-Administered Medications  ?Medication Dose Route Frequency Provider Last Rate Last Admin  ? acetaminophen (TYLENOL) tablet 650 mg  650 mg Oral Q6H PRN Kristopher Oppenheim, DO      ? Or  ? acetaminophen (TYLENOL) suppository 650 mg  650 mg Rectal Q6H PRN Kristopher Oppenheim, DO      ? allopurinol (ZYLOPRIM) tablet 100 mg  100 mg Oral QHS Kristopher Oppenheim, DO      ? citalopram (CELEXA) tablet 10 mg  10 mg Oral Daily Kristopher Oppenheim, DO    10 mg at 03/27/22 1026  ? insulin aspart (novoLOG) injection 0-5 Units  0-5 Units Subcutaneous QHS Kristopher Oppenheim, DO      ? insulin aspart (novoLOG) injection 0-9 Units  0-9 Units Subcutaneous TID WC Kristopher Oppenheim, DO   2 Units at 03/27/22 0746  ? lactated ringers infusion   Intravenous Continuous Kristopher Oppenheim, DO 100 mL/hr at 03/27/22 0331 New Bag at 03/27/22 0331  ? ondansetron (ZOFRAN) tablet 4 mg  4 mg Oral Q6H PRN Kristopher Oppenheim, DO      ? Or  ? ondansetron (ZOFRAN) injection 4 mg  4 mg Intravenous Q6H PRN Kristopher Oppenheim, DO      ? pantoprazole (PROTONIX) injection 40 mg  40 mg Intravenous Q12H Esterwood, Amy S, PA-C      ? ?Current Outpatient Medications  ?Medication Sig Dispense Refill  ? allopurinol (ZYLOPRIM) 100 MG tablet Take 100 mg by mouth at bedtime.    ?  amLODipine (NORVASC) 5 MG tablet Take 5 mg by mouth at bedtime.    ? CALCIUM-VITAMIN D PO Take 1 tablet by mouth at bedtime.    ? Cholecalciferol (VITAMIN D3 PO) Take 1 tablet by mouth at bedtime. Unsure of dose    ? citalopram (CELEXA) 10 MG tablet Take 10 mg by mouth daily.    ? famotidine (PEPCID) 40 MG tablet Take 40 mg by mouth 2 (two) times daily.    ? ferrous sulfate 325 (65 FE) MG tablet Take 325 mg by mouth 2 (two) times daily with a meal.    ? fluocinonide cream (LIDEX) [id number] % Apply 1 application. topically daily as needed (dry patches on ear).    ? losartan (COZAAR) 50 MG tablet Take 50 mg by mouth daily.    ? melatonin 1 MG TABS tablet Take 1 mg by mouth at bedtime.    ? Multiple Vitamins-Minerals (MULTIVITAMIN ADULT) CHEW Chew 1 tablet by mouth daily.    ? nitroGLYCERIN (NITROSTAT) 0.4 MG SL tablet Place 0.4 mg under the tongue every 5 (five) minutes as needed for chest pain.    ? Polyethyl Glycol-Propyl Glycol (SYSTANE OP) Place 1 drop into both eyes in the morning and at bedtime.    ? [place] 420 MG/3.5ML SOCT Inject 420 mg into the skin every 28 (twenty-eight) days.    ? sildenafil (REVATIO) 20 MG tablet Take 40-100 mg by mouth daily  as needed (ED).    ? silodosin (RAPAFLO) 4 MG CAPS capsule Take 4 mg by mouth at bedtime.    ? vitamin C (ASCORBIC ACID) 500 MG tablet Take 500 mg by mouth daily.    ? warfarin (COUMADIN) 10 MG tabl

## 2022-03-27 NOTE — Assessment & Plan Note (Signed)
On coumadin for hx of DVT and PAF. ?

## 2022-03-27 NOTE — Op Note (Signed)
[facility] ?Patient Name: Brian Cummings ?Procedure Date : 03/27/2022 ?MRN: [id number] ?Attending MD: Estill Cotta. Loletha Carrow , MD ?Date of Birth: 1956-05-12 ?CSN: [id number] ?Age: 66 ?Admit Type: Inpatient ?Procedure:                Upper GI endoscopy ?Indications:              Acute post hemorrhagic anemia, Hematochezia, Melena ?                          EGD being done to r/o UGI source of bleeding ?Providers:                Estill Cotta. Loletha Carrow, MD, Doristine Johns, RN, Elberta Fortis  ?                          Johnella Moloney, Technician ?Referring MD:             [facility] ?Medicines:                Monitored Anesthesia Care ?Complications:            No immediate complications. ?Estimated Blood Loss:     Estimated blood loss: none. ?Procedure:                Pre-Anesthesia Assessment: ?                          - Prior to the procedure, a History and Physical  ?                          was performed, and patient medications and  ?                          allergies were reviewed. The patient's tolerance of  ?                          previous anesthesia was also reviewed. The risks  ?                          and benefits of the procedure and the sedation  ?                          options and risks were discussed with the patient.  ?                          All questions were answered, and informed consent  ?                          was obtained. Prior Anticoagulants: The patient has  ?                          taken Coumadin (warfarin), last dose was 1 day  ?                          prior to procedure (reversed with FFR and Kcentra).  ?  ASA Grade Assessment: IV - A patient with severe  ?                          systemic disease that is a constant threat to life.  ?                          After reviewing the risks and benefits, the patient  ?                          was deemed in satisfactory condition to undergo the  ?                          procedure. ?                           After obtaining informed consent, the endoscope was  ?                          passed under direct vision. Throughout the  ?                          procedure, the patient's blood pressure, pulse, and  ?                          oxygen saturations were monitored continuously. The  ?                          [id number]) Olympus endoscope was introduced  ?                          through the mouth, and advanced to the second part  ?                          of duodenum. The upper GI endoscopy was  ?                          accomplished without difficulty. The patient  ?                          tolerated the procedure well. ?Scope In: ?Scope Out: ?Findings: ?     The esophagus was normal. ?     The stomach was normal. (J-shaped) ?     The cardia and gastric fundus were normal on retroflexion. ?     A single diminutive erosion without bleeding was found in the duodenal  ?     bulb. ?     The exam of the duodenum was otherwise normal. ?Impression:               - Normal esophagus. ?                          - Normal stomach. ?                          - Duodenal erosion without bleeding. ?                          -  No specimens collected. ?Recommendation:           - Return patient to hospital ward for ongoing care. ?                          - Clear liquid diet. ?                          - Serial Hgb /Hct ?                          Stop protonix drip ?                          GI service will follow to determine timing of  ?                          colonoscopy (most likely day after tomorrow) ?Procedure Code(s):        --- Professional --- ?                          [phone], Esophagogastroduodenoscopy, flexible,  ?                          transoral; diagnostic, including collection of  ?                          specimen(s) by brushing or washing, when performed  ?                          (separate procedure) ?Diagnosis Code(s):        --- Professional --- ?                          K26.9, Duodenal ulcer,  unspecified as acute or  ?                          chronic, without hemorrhage or perforation ?                          D62, Acute posthemorrhagic anemia ?                          K92.1, Melena (includes Hematochezia) ?CPT copyright 2019 American Medical Association. All rights reserved. ?The codes documented in this report are preliminary and upon coder review may  ?be revised to meet current compliance requirements. ?Bijon Mineer L. Loletha Carrow, MD ?03/27/2022 4:40:29 PM ?This report has been signed electronically. ?Number of Addenda: 0 ?

## 2022-03-27 NOTE — Assessment & Plan Note (Signed)
Pt with hx of celiac disease and IBS. Pt states he had daily diarrhea. Takes questran daily to help with diarrhea. ?

## 2022-03-27 NOTE — Anesthesia Postprocedure Evaluation (Signed)
Anesthesia Post Note ? ?Patient: Brian Cummings ? ?Procedure(s) Performed: ESOPHAGOGASTRODUODENOSCOPY (EGD) WITH PROPOFOL ? ?  ? ?Patient location during evaluation: PACU ?Anesthesia Type: MAC ?Level of consciousness: awake ?Pain management: pain level controlled ?Vital Signs Assessment: post-procedure vital signs reviewed and stable ?Respiratory status: spontaneous breathing, nonlabored ventilation, respiratory function stable and patient connected to nasal cannula oxygen ?Cardiovascular status: stable and blood pressure returned to baseline ?Postop Assessment: no apparent nausea or vomiting ?Anesthetic complications: no ? ? ?No notable events documented. ? ?Last Vitals:  ?Vitals:  ? 03/27/22 1710 03/27/22 1724  ?BP: 101/67 113/72  ?Pulse: 69 63  ?Resp: 18 18  ?Temp: 36.4 ?C 36.7 ?C  ?SpO2: 99% 98%  ?  ?Last Pain:  ?Vitals:  ? 03/27/22 1745  ?TempSrc:   ?PainSc: 0-No pain  ? ? ?  ?  ?  ?  ?  ?  ? ?Richie Vadala P Avett Reineck ? ? ? ? ?

## 2022-03-27 NOTE — Assessment & Plan Note (Signed)
Admit to observation telemetry bed. Likely due to excessive, self-administration of viagra. ?

## 2022-03-27 NOTE — ED Notes (Signed)
To CT

## 2022-03-27 NOTE — Assessment & Plan Note (Signed)
Check A1c. Pt is diet controlled. On a gluten-free diet due to celiac disease. ?

## 2022-03-27 NOTE — Assessment & Plan Note (Signed)
Hx of CABG. On repatha for lipids. ?

## 2022-03-27 NOTE — Assessment & Plan Note (Signed)
On gluten free diet 

## 2022-03-27 NOTE — ED Notes (Signed)
To ENDO now ?

## 2022-03-27 NOTE — Assessment & Plan Note (Signed)
Stable. On coumadin for CVA prophylaxis. Will need to hold coumadin for now due to GI bleeding and elevated INR(4.8). ?

## 2022-03-27 NOTE — Assessment & Plan Note (Signed)
Due to coumadin. INR 4.8. will give 2 units FFP and hold coumadin. ?

## 2022-03-27 NOTE — H&P (Signed)
?History and Physical  ? ? ?Brian Cummings [id number] DOB: [birth year] DOA: 03/26/2022 ? ?DOS: the patient was seen and examined on 03/26/2022 ? ?[facility]P: [facility] [place]  ? ?Patient coming from: Home ? ?I have personally briefly reviewed patient's old medical records in [place] ? ?CC: syncope, GI bleeding ?HPI: ?66 year old white male history of coronary disease status post CABG, paroxysmal atrial fibrillation, history of DVT on Coumadin, CKD stage IIIa baseline creatinine 1.4, diet-controlled diabetes, history of celiac disease/IBS, BPH, hypertension who presents to the ER today with multiple episodes of syncope while at home.  Patient states that he was not feeling well today.  However he decided to take 60 mg of Viagra this evening around 5 PM.  Around 9:00 this evening, while going to the bathroom, he had a syncopal episode.  He states that while he was in the bathroom he had a bloody stool.  After he got up from the bathroom, he collapsed.  He hit his head against a metal cabinet.  His significant other was with him.  He managed to crawl to the living room.  He then had a another bloody bowel movement and passed out.  EMS was called.  While they were getting him into a wheelchair, he passed out again.  Reportedly his systolic blood pressure was in the 60s. ? ?Patient reports that he has had recent changes to his medications.  [facility].  He was started on Rapaflo for his BPH.  Patient states that Flomax in the past this caused excessive hypotension. ? ?On arrival to the ER, temp 98.4 heart rate 72 blood pressure 82/64. ? ? ?He was given 750 cc of IV fluids. ? ?Labs: ?INR 4.8 ? ?BUN of 38 creatinine 1.72 ? ?Lactic acid 1.9 ? ?Hemoglobin 11.4, platelets 178 ? ?Due to the patient's fall and syncope, trauma service was called.  There is no acute injury. ? ?CT head chest abdomen pelvis and cervical spine were all negative for acute processes. ? ?[facility] contacted for admission.  ? ?ED Course: CT head, chest, abd/pelvis negative. INR 4.8 ? ?Review of Systems:  ?Review of Systems  ?Constitutional:  Positive for malaise/fatigue.  ?HENT: Negative.    ?Eyes: Negative.   ?Respiratory: Negative.    ?Cardiovascular: Negative.   ?Gastrointestinal:  Positive for blood in stool and diarrhea.  ?     Chronic diarrhea ?2 episodes of bloody stools bright red  ?Genitourinary:  Positive for frequency and hematuria. Negative for dysuria.  ?Musculoskeletal: Negative.   ?Skin: Negative.   ?Neurological:  Positive for dizziness.  ?Endo/Heme/Allergies: Negative.   ?Psychiatric/Behavioral: Negative.    ?All other systems reviewed and are negative. ? ?Past Medical History:  ?Diagnosis Date  ? Celiac disease   ? Coronary artery disease   ? Hypertension   ? IBS (irritable bowel syndrome)   ? Pulmonary embolism ([place])   ? Renal disorder   ? ? ?Past Surgical History:  ?Procedure Laterality Date  ? CHOLECYSTECTOMY    ? CORONARY ARTERY BYPASS GRAFT    ? ? ? reports that he has never smoked. He does not have any smokeless tobacco history on file. He reports that he does not drink alcohol and does not use drugs. ? ?Allergies  ?Allergen Reactions  ? Flomax [Tamsulosin]   ? Gluten Meal   ?  Other reaction(s): celiac disease  ? Statins   ?  Other reaction(s): myalgia  ? ? ?History reviewed. No  pertinent family history. ? ?Prior to Admission medications   ?Medication Sig Start Date End Date Taking? Authorizing Provider  ?allopurinol (ZYLOPRIM) 100 MG tablet Take 100 mg by mouth at bedtime. 02/09/22  Yes [provider]  ?amLODipine (NORVASC) 5 MG tablet Take 5 mg by mouth at bedtime. 12/30/21  Yes [provider]  ?CALCIUM-VITAMIN D PO Take 1 tablet by mouth at bedtime.   Yes [provider]  ?Cholecalciferol (VITAMIN D3 PO) Take 1 tablet by mouth at bedtime. Unsure of dose   Yes [provider]  ?citalopram (CELEXA) 10 MG tablet Take 10 mg by mouth daily.    Yes [provider]  ?famotidine (PEPCID) 40 MG tablet Take 40 mg by mouth 2 (two) times daily.   Yes [provider]  ?ferrous sulfate 325 (65 FE) MG tablet Take 325 mg by mouth 2 (two) times daily with a meal.   Yes [provider]  ?fluocinonide cream (LIDEX) 0.05 % Apply 1 application. topically daily as needed (dry patches on ear).   Yes [provider]  ?losartan (COZAAR) 50 MG tablet Take 50 mg by mouth daily. 03/21/22  Yes [provider]  ?melatonin 1 MG TABS tablet Take 1 mg by mouth at bedtime.   Yes [provider]  ?Multiple Vitamins-Minerals (MULTIVITAMIN ADULT) CHEW Chew 1 tablet by mouth daily.   Yes [provider]  ?nitroGLYCERIN (NITROSTAT) 0.4 MG SL tablet Place 0.4 mg under the tongue every 5 (five) minutes as needed for chest pain.   Yes [provider]  ?Polyethyl Glycol-Propyl Glycol (SYSTANE OP) Place 1 drop into both eyes in the morning and at bedtime.   Yes [provider]  ?REPATHA PUSHTRONEX SYSTEM 420 MG/3.5ML SOCT Inject 420 mg into the skin every 28 (twenty-eight) days. 03/24/22  Yes [provider]  ?sildenafil (REVATIO) 20 MG tablet Take 40-100 mg by mouth daily as needed (ED). 02/18/22  Yes [provider]  ?silodosin (RAPAFLO) 4 MG CAPS capsule Take 4 mg by mouth at bedtime. 03/18/22  Yes [provider]  ?vitamin C (ASCORBIC ACID) 500 MG tablet Take 500 mg by mouth daily.   Yes [provider]  ?warfarin (COUMADIN) 10 MG tablet Take 10 mg by mouth every Monday, Wednesday, and Friday. 01/13/22  Yes [provider]  ?warfarin (COUMADIN) 4 MG tablet Take 8 mg by mouth See admin instructions. Sunday,Tuesday,Thursday,saturday 02/13/22  Yes [provider]  ? ? ?Physical Exam: ?Vitals:  ? 03/27/22 0130 03/27/22 0145 03/27/22 0200 03/27/22 0215  ?BP: 98/75 98/70 109/73 109/73  ?Pulse: 73 72 70 77  ?Resp: 16 15 18 11   ?Temp:      ?TempSrc:      ?SpO2: 96% 94% 97%  98%  ?Weight:      ?Height:      ? ? ?Physical Exam ?Vitals and nursing note reviewed.  ?Constitutional:   ?   General: He is not in acute distress. ?   Appearance: Normal appearance. He is normal weight. He is not ill-appearing, toxic-appearing or diaphoretic.  ?HENT:  ?   Head: Normocephalic and atraumatic.  ?   Nose: Nose normal. No rhinorrhea.  ?Eyes:  ?   General: No scleral icterus. ?Cardiovascular:  ?   Rate and Rhythm: Normal rate and regular rhythm.  ?   Pulses: Normal pulses.  ?Pulmonary:  ?   Effort: Pulmonary effort is normal. No respiratory distress.  ?   Breath sounds: Normal breath sounds. No wheezing or rales.  ?  Abdominal:  ?   General: Abdomen is flat. Bowel sounds are normal. There is no distension.  ?   Tenderness: There is no abdominal tenderness. There is no guarding.  ?Musculoskeletal:  ?   Right lower leg: No edema.  ?   Left lower leg: No edema.  ?Skin: ?   General: Skin is warm and dry.  ?   Capillary Refill: Capillary refill takes less than 2 seconds.  ?Neurological:  ?   General: No focal deficit present.  ?   Mental Status: He is alert and oriented to person, place, and time.  ?  ? ?Labs on Admission: I have personally reviewed following labs and imaging studies ? ?CBC: ?Recent Labs  ?Lab 03/27/22 ?0002 03/27/22 ?0006  ?WBC 6.9  --   ?HGB 11.4* 11.2*  ?HCT 34.8* 33.0*  ?MCV 90.4  --   ?PLT 178  --   ? ?Basic Metabolic Panel: ?Recent Labs  ?Lab 03/27/22 ?0002 03/27/22 ?0006  ?NA 137 138  ?K 4.7 4.8  ?CL 109 106  ?CO2 21*  --   ?GLUCOSE 177* 174*  ?BUN 38* 47*  ?CREATININE 1.72* 1.80*  ?CALCIUM 8.4*  --   ? ?GFR: ?Estimated Creatinine Clearance: 52.6 mL/min (A) (by C-G formula based on SCr of 1.8 mg/dL (H)). ?Liver Function Tests: ?Recent Labs  ?Lab 03/27/22 ?0002  ?AST 25  ?ALT 24  ?ALKPHOS 21*  ?BILITOT 0.5  ?PROT 5.2*  ?ALBUMIN 3.3*  ? ?No results for input(s): LIPASE, AMYLASE in the last 168 hours. ?No results for input(s): AMMONIA in the last 168 hours. ?Coagulation Profile: ?Recent  Labs  ?Lab 03/27/22 ?0002  ?INR 4.8*  ? ?Cardiac Enzymes: ?Recent Labs  ?Lab 03/27/22 ?0002  ?TROPONINIHS 4  ? ?BNP (last 3 results) ?No results for input(s): PROBNP in the last 8760 hours. ?HbA1C: ?No resul

## 2022-03-27 NOTE — Progress Notes (Signed)
Orthopedic Tech Progress Note ?Patient Details:  ?Brian Cummings ?01-24-1956 ?[id number] ? ?Patient ID: [name], male   DOB: 11/12/1956, 66 y.o.   MRN: [id number] ?I attended trauma page. ?Trinna Post ?03/27/2022, 12:47 AM ? ?

## 2022-03-27 NOTE — Assessment & Plan Note (Signed)
Pt recently started rapaflo for his BPH symptoms(1 week ago). Pt states flomax caused excessive hypotension.  Recent addition of rapaflo could have contributed to his syncope/collapse today. ?

## 2022-03-27 NOTE — Progress Notes (Signed)
?PROGRESS NOTE ? ? ? ?Brian Cummings  [id number] DOB: 08/31/1956 DOA: 03/26/2022 ?PCP: [facility] [place] ? ? ?Brief Narrative:  ?66 year old white male history of coronary disease status post CABG, paroxysmal atrial fibrillation, history of DVT on Coumadin, CKD stage IIIa baseline creatinine 1.4, diet-controlled diabetes, history of celiac disease/IBS, BPH, hypertension who presented to the ER today with multiple episodes of syncope while at home.  Patient states that he was not feeling well today.  However he decided to take 60 mg of Viagra this evening around 5 PM.  Around 9:00 this evening, while going to the bathroom, he had a syncopal episode.  He states that while he was in the bathroom he had a bloody stool.  After he got up from the bathroom, he collapsed.  He hit his head against a metal cabinet.  His significant other was with him.  He managed to crawl to the living room.  He then had a another bloody bowel movement and passed out.  EMS was called.  While they were getting him into a wheelchair, he passed out again.  Reportedly his systolic blood pressure was in the 60s. ?  ?Patient reports that he has had recent changes to his medications.  [facility].  He was started on Rapaflo for his BPH.  Patient states that Flomax in the past this caused excessive hypotension. On arrival to the ER, temp 98.4 heart rate 72 blood pressure 82/64.  INR 4.8.  Creatinine 1.72.  Lactic acid 1.9.  Hemoglobin 7.4.  CT head chest and abdomen and pelvis as well as cervical spine were all negative for acute process. ? ?Assessment & Plan: ?  ?Principal Problem: ?  Syncope and collapse ?Active Problems: ?  Acute renal failure superimposed on stage 3a chronic kidney disease ([place]) ?  GI bleeding ?  Elevated INR ?  CAD (coronary artery disease), native coronary artery ?  BPH with obstruction/lower urinary tract symptoms ?  Celiac disease ?  Chronic kidney disease, stage 3a ([place]) ?   Essential hypertension ?  Diet-controlled diabetes mellitus ([place]) ?  History of DVT of lower extremity ?  Paroxysmal atrial fibrillation ([facility]) ?  Type 2 diabetes mellitus with diabetic chronic kidney disease ([place]) ?  IBS (irritable bowel syndrome)/chronic diarrhea ? ?Recurrent syncope and collapse: Likely due to combination of rectal bleeding as well as hypotension in the setting of initiation of new medication Rapaflo and agent effect of Viagra.  Blood pressure still on the borderline.  He has no further symptoms of syncope or dizziness.  All blood pressure medications on hold. ? ?Hypotension: Takes Cozaar and amlodipine at home.  Per him, he does not check his blood pressure at home but previous to moving to [place] up until 5 months ago, he was checking his blood pressure and usually his systolic was running around [id number] and diastolic around 80 and this was despite of him taking amlodipine and Cozaar which she has been taking for 20 years.  Holding all antihypertensives.  Blood pressure borderline low. ? ?AKI on CKD stage IIIa: Baseline creatinine around 1.5.  Presented with 1.8.  Now back to baseline. ? ?Rectal bleeding/supratherapeutic INR/coagulopathy: Had few episodes of rectal bleeding at home and 1 episode here this morning.  Likely in the setting of elevated INR which was 4.8 yesterday.  He received 2 units of FFP.  INR 2.8.  Continue to hold Coumadin.  I have consulted GI for their recommendations. ? ?  Type 2 diabetes mellitus: Hemoglobin A1c 7.3.  Continue SSI. ? ?Paroxysmal atrial fibrillation: Rates controlled.  Holding Coumadin as mentioned above. ? ?History of DVT of lower extremity and PE x2: Holding Coumadin. ? ?BPH with obstructive symptoms: Patient has recently been started on Rapaflo.  Symptoms are controlled. ? ?History of CAD s/p CABG/hyperlipidemia: On Repatha for lipids. ? ?Morbid obesity: Will need counseling for weight reduction. ? ?DVT prophylaxis: SCDs Start: 03/27/22 0314 ?   Code Status: Full Code  ?Family Communication:  None present at bedside.  Plan of care discussed with patient in length and he/she verbalized understanding and agreed with it. ? ?Status is: Observation ?The patient will require care spanning > 2 midnights and should be moved to inpatient because: Still having rectal bleeding.  May need colonoscopy.  Blood pressure also borderline. ? ? ?Estimated body mass index is 29.37 kg/m? as calculated from the following: ?  Height as of this encounter: 6\' 3"  (1.905 m). ?  Weight as of this encounter: 106.6 kg. ? ?  ?Nutritional Assessment: ?Body mass index is 29.37 kg/m?Marland KitchenMarland Kitchen ?Seen by dietician.  I agree with the assessment and plan as outlined below: ?Nutrition Status: ?  ?  ?  ? ?. ?Skin Assessment: ?I have examined the patient's skin and I agree with the wound assessment as performed by the wound care RN as outlined below: ?  ? ?Consultants:  ?GI ? ?Procedures:  ?None ? ?Antimicrobials:  ?Anti-infectives (From admission, onward)  ? ? None  ? ?  ?  ? ? ?Subjective: ?Patient seen and examined.  No dizziness but he had 1 rectal bleeding this morning.  No other complaint. ? ?Objective: ?Vitals:  ? 03/27/22 0815 03/27/22 0830 03/27/22 0845 03/27/22 0900  ?BP: 111/70 (!) 113/55 (!) 109/51 (!) 109/55  ?Pulse: (!) 49 78 (!) 57 (!) 56  ?Resp: 16 18 18 18   ?Temp:      ?TempSrc:      ?SpO2: 97% 98% 96% 99%  ?Weight:      ?Height:      ? ? ?Intake/Output Summary (Last 24 hours) at 03/27/2022 1008 ?Last data filed at 03/27/2022 0802 ?Gross per 24 hour  ?Intake 2165.16 ml  ?Output 625 ml  ?Net 1540.16 ml  ? ?Filed Weights  ? 03/27/22 0022  ?Weight: 106.6 kg  ? ? ?Examination: ? ?General exam: Appears calm and comfortable morbidly obese ?Respiratory system: Clear to auscultation. Respiratory effort normal. ?Cardiovascular system: S1 & S2 heard, RRR. No JVD, murmurs, rubs, gallops or clicks. No pedal edema. ?Gastrointestinal system: Abdomen is nondistended, soft and nontender. No organomegaly or  masses felt. Normal bowel sounds heard. ?Central nervous system: Alert and oriented. No focal neurological deficits. ?Extremities: Symmetric 5 x 5 power. ?Skin: No rashes, lesions or ulcers ?Psychiatry: Judgement and insight appear normal. Mood & affect appropriate.  ? ? ?Data Reviewed: I have personally reviewed following labs and imaging studies ? ?CBC: ?Recent Labs  ?Lab 03/27/22 ?0002 03/27/22 ?0006 03/27/22 ?0411  ?WBC 6.9  --   --   ?HGB 11.4* 11.2* 11.0*  ?HCT 34.8* 33.0* 32.3*  ?MCV 90.4  --   --   ?PLT 178  --   --   ? ?Basic Metabolic Panel: ?Recent Labs  ?Lab 03/27/22 ?0002 03/27/22 ?0006 03/27/22 ?0411  ?NA 137 138 135  ?K 4.7 4.8 5.3*  ?CL 109 106 109  ?CO2 21*  --  22  ?GLUCOSE 177* 174* 205*  ?BUN 38* 47* 36*  ?CREATININE 1.72* 1.80* 1.52*  ?  CALCIUM 8.4*  --  8.6*  ?MG  --   --  1.8  ? ?GFR: ?Estimated Creatinine Clearance: 62.2 mL/min (A) (by C-G formula based on SCr of 1.52 mg/dL (H)). ?Liver Function Tests: ?Recent Labs  ?Lab 03/27/22 ?0002 03/27/22 ?0411  ?AST 25 18  ?ALT 24 24  ?ALKPHOS 21* 23*  ?BILITOT 0.5 0.7  ?PROT 5.2* 5.3*  ?ALBUMIN 3.3* 3.3*  ? ?No results for input(s): LIPASE, AMYLASE in the last 168 hours. ?No results for input(s): AMMONIA in the last 168 hours. ?Coagulation Profile: ?Recent Labs  ?Lab 03/27/22 ?0002 03/27/22 ?[id number]  ?INR 4.8* 2.6*  ? ?Cardiac Enzymes: ?No results for input(s): CKTOTAL, CKMB, CKMBINDEX, TROPONINI in the last 168 hours. ?BNP (last 3 results) ?No results for input(s): PROBNP in the last 8760 hours. ?HbA1C: ?Recent Labs  ?  03/27/22 ?0411  ?HGBA1C 7.3*  ? ?CBG: ?Recent Labs  ?Lab 03/26/22 ?2356 03/27/22 ?0739  ?GLUCAP 175* 169*  ? ?Lipid Profile: ?No results for input(s): CHOL, HDL, LDLCALC, TRIG, CHOLHDL, LDLDIRECT in the last 72 hours. ?Thyroid Function Tests: ?No results for input(s): TSH, T4TOTAL, FREET4, T3FREE, THYROIDAB in the last 72 hours. ?Anemia Panel: ?No results for input(s): VITAMINB12, FOLATE, FERRITIN, TIBC, IRON, RETICCTPCT in the last 72  hours. ?Sepsis Labs: ?Recent Labs  ?Lab 03/27/22 ?0002  ?LATICACIDVEN 1.9  ? ? ?Recent Results (from the past 240 hour(s))  ?Resp Panel by RT-PCR (Flu A&B, Covid) Nasopharyngeal Swab     Status: None  ? C

## 2022-03-27 NOTE — ED Provider Notes (Signed)
?[facility] ?Provider Note ? ? ?CSN: [id number] ?Arrival date & time: 03/26/22  2350 ? ?  ? ?History ? ?Chief Complaint  ?Patient presents with  ? Fall  ? Loss of Consciousness  ? ? ?Brian Cummings is a [age] y.o. male. ? ?The history is provided by the EMS personnel. The history is limited by the condition of the patient.  ?Loss of Consciousness ?Episode history:  Multiple ?Most recent episode:  Today ?Timing:  Constant ?Progression:  Resolved ?Chronicity:  New ?Context: not sight of blood   ?Witnessed: yes   ?Relieved by:  Nothing ?Worsened by:  Nothing ?Associated symptoms: diaphoresis   ?Associated symptoms: no chest pain, no fever, no vomiting and no weakness   ?Associated symptoms comment:  Took flomax, 3 viagra and his BP meds together.  Then passed out multiple times and fell and hit head on coumadin  ?Risk factors: coronary artery disease   ?Patient with CAD ?  ? ?Home Medications ?Prior to Admission medications   ?Medication Sig Start Date End Date Taking? Authorizing Provider  ?allopurinol (ZYLOPRIM) 100 MG tablet Take 100 mg by mouth at bedtime. 02/09/22  Yes [provider]  ?amLODipine (NORVASC) 5 MG tablet Take 5 mg by mouth at bedtime. 12/30/21  Yes [provider]  ?CALCIUM-VITAMIN D PO Take 1 tablet by mouth at bedtime.   Yes [provider]  ?Cholecalciferol (VITAMIN D3 PO) Take 1 tablet by mouth at bedtime. Unsure of dose   Yes [provider]  ?citalopram (CELEXA) 10 MG tablet Take 10 mg by mouth daily.   Yes [provider]  ?famotidine (PEPCID) 40 MG tablet Take 40 mg by mouth 2 (two) times daily.   Yes [provider]  ?ferrous sulfate 325 (65 FE) MG tablet Take 325 mg by mouth 2 (two) times daily with a meal.   Yes [provider]  ?fluocinonide cream (LIDEX) 0.05 % Apply 1 application. topically daily as needed (dry patches on ear).   Yes [provider]  ?losartan (COZAAR) 50 MG tablet  Take 50 mg by mouth daily. 03/21/22  Yes [provider]  ?melatonin 1 MG TABS tablet Take 1 mg by mouth at bedtime.   Yes [provider]  ?Multiple Vitamins-Minerals (MULTIVITAMIN ADULT) CHEW Chew 1 tablet by mouth daily.   Yes [provider]  ?nitroGLYCERIN (NITROSTAT) 0.4 MG SL tablet Place 0.4 mg under the tongue every 5 (five) minutes as needed for chest pain.   Yes [provider]  ?Polyethyl Glycol-Propyl Glycol (SYSTANE OP) Place 1 drop into both eyes in the morning and at bedtime.   Yes [provider]  ?REPATHA PUSHTRONEX SYSTEM 420 MG/3.5ML SOCT Inject 420 mg into the skin every 28 (twenty-eight) days. 03/24/22  Yes [provider]  ?sildenafil (REVATIO) 20 MG tablet Take 40-100 mg by mouth daily as needed (ED). 02/18/22  Yes [provider]  ?silodosin (RAPAFLO) 4 MG CAPS capsule Take 4 mg by mouth at bedtime. 03/18/22  Yes [provider]  ?vitamin C (ASCORBIC ACID) 500 MG tablet Take 500 mg by mouth daily.   Yes [provider]  ?warfarin (COUMADIN) 10 MG tablet Take 10 mg by mouth every Monday, Wednesday, and Friday. 01/13/22  Yes [provider]  ?warfarin (COUMADIN) 4 MG tablet Take 8 mg by mouth See admin instructions. Sunday,Tuesday,Thursday,saturday 02/13/22  Yes [provider]  ?   ? ?Allergies    ?Flomax [tamsulosin], Gluten meal, and Statins   ? ?  Review of Systems   ?Review of Systems  ?Constitutional:  Positive for diaphoresis. Negative for fever.  ?HENT:  Negative for facial swelling.   ?Eyes:  Negative for redness.  ?Respiratory:  Negative for wheezing and stridor.   ?Cardiovascular:  Positive for syncope. Negative for chest pain.  ?Gastrointestinal:  Negative for abdominal pain and vomiting.  ?Neurological:  Negative for facial asymmetry and weakness.  ?All other systems reviewed and are negative. ? ?Physical Exam ?Updated Vital Signs ?BP (!) 94/58   Pulse 72   Temp 97.7 ?F (36.5 ?C)   Resp 17    Ht 6\' 3"  (1.905 m)   Wt 106.6 kg   SpO2 100%   BMI 29.37 kg/m?  ?Physical Exam ?Vitals and nursing note reviewed.  ?Constitutional:   ?   Appearance: Normal appearance. He is diaphoretic.  ?HENT:  ?   Head: Normocephalic and atraumatic.  ?   Nose: Nose normal.  ?Eyes:  ?   Conjunctiva/sclera: Conjunctivae normal.  ?   Pupils: Pupils are equal, round, and reactive to light.  ?Cardiovascular:  ?   Rate and Rhythm: Normal rate and regular rhythm.  ?   Pulses: Normal pulses.  ?   Heart sounds: Normal heart sounds.  ?Pulmonary:  ?   Effort: Pulmonary effort is normal.  ?   Breath sounds: Normal breath sounds.  ?Abdominal:  ?   General: Bowel sounds are normal.  ?   Palpations: Abdomen is soft.  ?   Tenderness: There is no abdominal tenderness. There is no guarding.  ?Musculoskeletal:  ?   Cervical back: Normal range of motion and neck supple.  ?   Right lower leg: No edema.  ?   Left lower leg: No edema.  ?Skin: ?   Capillary Refill: Capillary refill takes less than 2 seconds.  ?Neurological:  ?   General: No focal deficit present.  ?   Mental Status: He is alert and oriented to person, place, and time.  ?   Deep Tendon Reflexes: Reflexes normal.  ?Psychiatric:     ?   Mood and Affect: Mood normal.     ?   Behavior: Behavior normal.  ? ? ?ED Results / Procedures / Treatments   ?Labs ?(all labs ordered are listed, but only abnormal results are displayed) ?Results for orders placed or performed during the hospital encounter of 03/26/22  ?Comprehensive metabolic panel  ?Result Value Ref Range  ? Sodium 137 135 - 145 mmol/L  ? Potassium 4.7 3.5 - 5.1 mmol/L  ? Chloride 109 98 - 111 mmol/L  ? CO2 21 (L) 22 - 32 mmol/L  ? Glucose, Bld 177 (H) 70 - 99 mg/dL  ? BUN 38 (H) 8 - 23 mg/dL  ? Creatinine, Ser 1.72 (H) 0.61 - 1.24 mg/dL  ? Calcium 8.4 (L) 8.9 - 10.3 mg/dL  ? Total Protein 5.2 (L) 6.5 - 8.1 g/dL  ? Albumin 3.3 (L) 3.5 - 5.0 g/dL  ? AST 25 15 - 41 U/L  ? ALT 24 0 - 44 U/L  ? Alkaline Phosphatase 21 (L) 38 - 126 U/L  ?  Total Bilirubin 0.5 0.3 - 1.2 mg/dL  ? GFR, Estimated 43 (L) >60 mL/min  ? Anion gap 7 5 - 15  ?CBC  ?Result Value Ref Range  ? WBC 6.9 4.0 - 10.5 K/uL  ? RBC 3.85 (L) 4.22 - 5.81 MIL/uL  ? Hemoglobin 11.4 (L) 13.0 - 17.0 g/dL  ? HCT 34.8 (L) 39.0 - 52.0 %  ?  MCV 90.4 80.0 - 100.0 fL  ? MCH 29.6 26.0 - 34.0 pg  ? MCHC 32.8 30.0 - 36.0 g/dL  ? RDW 14.3 11.5 - 15.5 %  ? Platelets 178 150 - 400 K/uL  ? nRBC 0.0 0.0 - 0.2 %  ?Lactic acid, plasma  ?Result Value Ref Range  ? Lactic Acid, Venous 1.9 0.5 - 1.9 mmol/L  ?Protime-INR  ?Result Value Ref Range  ? Prothrombin Time 44.4 (H) 11.4 - 15.2 seconds  ? INR 4.8 (HH) 0.8 - 1.2  ?CBG monitoring, ED  ?Result Value Ref Range  ? Glucose-Capillary 175 (H) 70 - 99 mg/dL  ?I-Stat Chem 8, ED  ?Result Value Ref Range  ? Sodium 138 135 - 145 mmol/L  ? Potassium 4.8 3.5 - 5.1 mmol/L  ? Chloride 106 98 - 111 mmol/L  ? BUN 47 (H) 8 - 23 mg/dL  ? Creatinine, Ser 1.80 (H) 0.61 - 1.24 mg/dL  ? Glucose, Bld 174 (H) 70 - 99 mg/dL  ? Calcium, Ion 1.07 (L) 1.15 - 1.40 mmol/L  ? TCO2 23 22 - 32 mmol/L  ? Hemoglobin 11.2 (L) 13.0 - 17.0 g/dL  ? HCT 33.0 (L) 39.0 - 52.0 %  ?Sample to Blood Bank  ?Result Value Ref Range  ? Blood Bank Specimen SAMPLE AVAILABLE FOR TESTING   ? Sample Expiration    ?  03/28/2022,2359 ?Performed at [facility], [address], [place], [place] [address] ?  ?Troponin I (High Sensitivity)  ?Result Value Ref Range  ? Troponin I (High Sensitivity) 4 <18 ng/L  ? ?CT HEAD WO CONTRAST ? ?Result Date: 03/27/2022 ?CLINICAL DATA:  Head trauma, intracranial arterial injury suspected. Fall. EXAM: CT HEAD WITHOUT CONTRAST TECHNIQUE: Contiguous axial images were obtained from the base of the skull through the vertex without intravenous contrast. RADIATION DOSE REDUCTION: This exam was performed according to the departmental dose-optimization program which includes automated exposure control, adjustment of the mA and/or kV according to patient size and/or use of iterative  reconstruction technique. COMPARISON:  None Available. FINDINGS: Brain: No acute intracranial abnormality. Specifically, no hemorrhage, hydrocephalus, mass lesion, acute infarction, or significant intracranial i

## 2022-03-27 NOTE — Assessment & Plan Note (Signed)
Due to excessively low BP today, will hold BP meds for now. ?

## 2022-03-27 NOTE — Assessment & Plan Note (Addendum)
Likely from elevated INR. Will give 2 units FFP. Will need GI consult. Pt does not have GI specialist here in [place].  Pt states he had normal colonoscopy 2-3 years ago in [place]. H&H q6h ?

## 2022-03-27 NOTE — Interval H&P Note (Signed)
History and Physical Interval Note: ? ?03/27/2022 ?4:10 PM ? ?Brian Cummings  has presented today for surgery, with the diagnosis of GI bleeding with anemia.  The various methods of treatment have been discussed with the patient and family. After consideration of risks, benefits and other options for treatment, the patient has consented to  Procedure(s): ?ESOPHAGOGASTRODUODENOSCOPY (EGD) WITH PROPOFOL (N/A) as a surgical intervention.  The patient's history has been reviewed, patient examined, no change in status, stable for surgery.  I have reviewed the patient's chart and labs.  Questions were answered to the patient's satisfaction.   ? ? ?Nelida Meuse III ? ? ?

## 2022-03-27 NOTE — Assessment & Plan Note (Signed)
Continue with IVF. Likely due to hypotension and GI bleeding. Repeat BMP ?

## 2022-03-27 NOTE — Assessment & Plan Note (Signed)
Baseline Scr 1.4 ?

## 2022-03-28 ENCOUNTER — Encounter (HOSPITAL_COMMUNITY): Payer: Self-pay | Admitting: Gastroenterology

## 2022-03-28 DIAGNOSIS — K5730 Diverticulosis of large intestine without perforation or abscess without bleeding: Secondary | ICD-10-CM

## 2022-03-28 DIAGNOSIS — R55 Syncope and collapse: Secondary | ICD-10-CM | POA: Diagnosis not present

## 2022-03-28 DIAGNOSIS — D6832 Hemorrhagic disorder due to extrinsic circulating anticoagulants: Secondary | ICD-10-CM

## 2022-03-28 DIAGNOSIS — D62 Acute posthemorrhagic anemia: Secondary | ICD-10-CM

## 2022-03-28 LAB — CBC WITH DIFFERENTIAL/PLATELET
Abs Immature Granulocytes: 0.04 10*3/uL (ref 0.00–0.07)
Basophils Absolute: 0 10*3/uL (ref 0.0–0.1)
Basophils Relative: 1 %
Eosinophils Absolute: 0.4 10*3/uL (ref 0.0–0.5)
Eosinophils Relative: 7 %
HCT: 26.9 % — ABNORMAL LOW (ref 39.0–52.0)
Hemoglobin: 8.9 g/dL — ABNORMAL LOW (ref 13.0–17.0)
Immature Granulocytes: 1 %
Lymphocytes Relative: 36 %
Lymphs Abs: 2.3 10*3/uL (ref 0.7–4.0)
MCH: 29.3 pg (ref 26.0–34.0)
MCHC: 33.1 g/dL (ref 30.0–36.0)
MCV: 88.5 fL (ref 80.0–100.0)
Monocytes Absolute: 0.6 10*3/uL (ref 0.1–1.0)
Monocytes Relative: 9 %
Neutro Abs: 2.9 10*3/uL (ref 1.7–7.7)
Neutrophils Relative %: 46 %
Platelets: 144 10*3/uL — ABNORMAL LOW (ref 150–400)
RBC: 3.04 MIL/uL — ABNORMAL LOW (ref 4.22–5.81)
RDW: 14.3 % (ref 11.5–15.5)
WBC: 6.2 10*3/uL (ref 4.0–10.5)
nRBC: 0 % (ref 0.0–0.2)

## 2022-03-28 LAB — BPAM FFP
Blood Product Expiration Date: 202305172359
Blood Product Expiration Date: 202305172359
ISSUE DATE / TIME: 202305140400
ISSUE DATE / TIME: 202305140400
Unit Type and Rh: 6200
Unit Type and Rh: 6200

## 2022-03-28 LAB — BASIC METABOLIC PANEL
Anion gap: 4 — ABNORMAL LOW (ref 5–15)
BUN: 27 mg/dL — ABNORMAL HIGH (ref 8–23)
CO2: 23 mmol/L (ref 22–32)
Calcium: 8.6 mg/dL — ABNORMAL LOW (ref 8.9–10.3)
Chloride: 109 mmol/L (ref 98–111)
Creatinine, Ser: 1.37 mg/dL — ABNORMAL HIGH (ref 0.61–1.24)
GFR, Estimated: 57 mL/min — ABNORMAL LOW (ref >60)
Glucose, Bld: 127 mg/dL — ABNORMAL HIGH (ref 70–99)
Potassium: 4 mmol/L (ref 3.5–5.1)
Sodium: 136 mmol/L (ref 135–145)

## 2022-03-28 LAB — PREPARE FRESH FROZEN PLASMA: Unit division: 0

## 2022-03-28 LAB — GLUCOSE, CAPILLARY
Glucose-Capillary: 153 mg/dL — ABNORMAL HIGH (ref 70–99)
Glucose-Capillary: 181 mg/dL — ABNORMAL HIGH (ref 70–99)
Glucose-Capillary: 130 mg/dL — ABNORMAL HIGH (ref 70–99)
Glucose-Capillary: 140 mg/dL — ABNORMAL HIGH (ref 70–99)

## 2022-03-28 LAB — PROTIME-INR
INR: 1.2 (ref 0.8–1.2)
Prothrombin Time: 14.8 seconds (ref 11.4–15.2)

## 2022-03-28 MED ORDER — PANTOPRAZOLE SODIUM 40 MG PO TBEC
40.0000 mg | DELAYED_RELEASE_TABLET | Freq: Every day | ORAL | Status: DC
  Administered 2022-03-29: 40 mg via ORAL
  Filled 2022-03-28: qty 1

## 2022-03-28 MED ORDER — PEG-KCL-NACL-NASULF-NA ASC-C 100 G PO SOLR: 1.0000 | Freq: Once | ORAL | Status: DC

## 2022-03-28 MED ORDER — BISACODYL 5 MG PO TBEC
20.0000 mg | DELAYED_RELEASE_TABLET | Freq: Once | ORAL | Status: AC
  Administered 2022-03-28: 20 mg via ORAL
  Filled 2022-03-28: qty 4

## 2022-03-28 MED ORDER — PEG-KCL-NACL-NASULF-NA ASC-C 100 G PO SOLR
0.5000 | Freq: Once | ORAL | Status: AC
  Administered 2022-03-28: 100 g via ORAL
  Filled 2022-03-28: qty 1

## 2022-03-28 MED ORDER — METOCLOPRAMIDE HCL 5 MG/ML IJ SOLN
10.0000 mg | Freq: Four times a day (QID) | INTRAMUSCULAR | Status: AC
  Administered 2022-03-28: 10 mg via INTRAVENOUS
  Filled 2022-03-28 (×2): qty 2

## 2022-03-28 NOTE — Plan of Care (Signed)
  Problem: Education: Goal: Knowledge of General Education information will improve Description: Including pain rating scale, medication(s)/side effects and non-pharmacologic comfort measures Outcome: Progressing   Problem: Health Behavior/Discharge Planning: Goal: Ability to manage health-related needs will improve Outcome: Progressing   Problem: Clinical Measurements: Goal: Ability to maintain clinical measurements within normal limits will improve Outcome: Progressing   Problem: Clinical Measurements: Goal: Cardiovascular complication will be avoided Outcome: Progressing   Problem: Pain Managment: Goal: General experience of comfort will improve Outcome: Progressing   Problem: Safety: Goal: Ability to remain free from injury will improve Outcome: Progressing   

## 2022-03-28 NOTE — Progress Notes (Addendum)
? ? Attending physician's note  ? ?I have taken a history, reviewed the chart, and examined the patient. I performed a substantive portion of this encounter, including complete performance of at least one of the key components, in conjunction with the APP. I agree with the APP's note, impression, and recommendations with my edits.  ? ?Hemoglobin stable at 8.9 (from 9 yesterday).  EGD yesterday with nonbleeding duodenal erosion, not felt to be culprit for acute bleeding event.  Does have a known history of diverticulosis on prior colonoscopy in 2021. ? ?- Plan for colonoscopy tomorrow for diagnostic and potentially therapeutic intent ?- Continue clears with n.p.o. at midnight ?- Starting bowel preparation this evening ?- Holding Coumadin ? ?The indications, risks, and benefits of colonoscopy were explained to the patient in detail. Risks include but are not limited to bleeding, perforation, adverse reaction to medications, and cardiopulmonary compromise. Sequelae include but are not limited to the possibility of surgery, prolonged hospitalization, and mortality. The patient verbalized understanding and wished to proceed. All questions answered. Further recommendations pending results of the exam.  ? ? ?[address], DO, FACG ?([phone] office  ? ?   ? ?        ? ?Daily Rounding Note ? ?03/28/2022, 2:19 PM ? LOS: 1 day  ? ?SUBJECTIVE:   ?Chief complaint:     ? ?Yesterday's stools were initially black with some leaching of blood.  Then black with no leaching of blood last night.  This morning at about 10 AM had a brown stool.  Feels well.  Wondering if celiac disease may be responsible for the GI bleed.  He is compliant with gluten-free diet. ? ?OBJECTIVE:        ? Vital signs in last 24 hours:    ?Temp:  [97.6 ?F (36.4 ?C)-98.3 ?F (36.8 ?C)] 97.9 ?F (36.6 ?C) (05/15 0800) ?Pulse Rate:  [63-79] 68 (05/15 0800) ?Resp:  [16-19] 19 (05/15 0454) ?BP:  (100-118)/(58-102) 103/64 (05/15 0800) ?SpO2:  [96 %-99 %] 96 % (05/15 0800) ?Weight:  [103.4 kg-106.2 kg] 103.4 kg (05/15 0454) ?Last BM Date : 03/28/22 ?Filed Weights  ? 03/27/22 0022 03/27/22 1735 03/28/22 0454  ?Weight: 106.6 kg 106.2 kg 103.4 kg  ? ?General: Looks well.  Comfortable. ?Heart: Telemetry monitor with A-fib rate 70. ?Chest: Clear bilaterally.  No labored breathing or cough. ?Abdomen: Not tender or distended.  Active bowel sounds. ?Extremities: No CCE. ?Neuro/Psych: Alert.  Affect normal, pleasant.  Fully oriented.  No gross deficits or tremors. ? ?Intake/Output from previous day: ?05/14 0701 - 05/15 0700 ?In: 537.3 [I.V.:400; IV Piggyback:137.3] ?Out: 1025 [Urine:825; Stool:200] ? ?Intake/Output this shift: ?Total I/O ?In: 240 [P.O.:240] ?Out: -  ? ?Lab Results: ?Recent Labs  ?  03/27/22 ?0002 03/27/22 ?0006 03/27/22 ?1811 03/27/22 ?2057 03/28/22 ?0131  ?WBC 6.9  --   --   --  6.2  ?HGB 11.4*   < > 9.1* 9.0* 8.9*  ?HCT 34.8*   < > 27.5* 27.8* 26.9*  ?PLT 178  --   --   --  144*  ? < > = values in this interval not displayed.  ? ?BMET ?Recent Labs  ?  03/27/22 ?0002 03/27/22 ?0006 03/27/22 ?0411 03/28/22 ?0131  ?NA 137 138 135 136  ?K 4.7 4.8 5.3* 4.0  ?CL 109 106 109 109  ?CO2 21*  --  22 23  ?GLUCOSE 177* 174* 205* 127*  ?BUN 38* 47* 36* 27*  ?CREATININE 1.72* 1.80* 1.52* 1.37*  ?CALCIUM 8.4*  --  8.6* 8.6*  ? ?LFT ?Recent Labs  ?  03/27/22 ?0002 03/27/22 ?0411  ?PROT 5.2* 5.3*  ?ALBUMIN 3.3* 3.3*  ?AST 25 18  ?ALT 24 24  ?ALKPHOS 21* 23*  ?BILITOT 0.5 0.7  ? ?PT/INR ?Recent Labs  ?  03/27/22 ?1811 03/28/22 ?0827  ?LABPROT 16.2* 14.8  ?INR 1.3* 1.2  ? ?Hepatitis Panel ?No results for input(s): HEPBSAG, HCVAB, HEPAIGM, HEPBIGM in the last 72 hours. ? ?Studies/Results: ?CT HEAD WO CONTRAST ? ?Result Date: 03/27/2022 ?CLINICAL DATA:  Head trauma, intracranial arterial injury suspected. Fall. EXAM: CT HEAD WITHOUT CONTRAST TECHNIQUE: Contiguous axial images were obtained from the base of the skull  through the vertex without intravenous contrast. RADIATION DOSE REDUCTION: This exam was performed according to the departmental dose-optimization program which includes automated exposure control, adjustment of the mA and/or kV according to patient size and/or use of iterative reconstruction technique. COMPARISON:  None Available. FINDINGS: Brain: No acute intracranial abnormality. Specifically, no hemorrhage, hydrocephalus, mass lesion, acute infarction, or significant intracranial injury. Vascular: No hyperdense vessel or unexpected calcification. Skull: No acute calvarial abnormality. Sinuses/Orbits: No acute findings Other: None IMPRESSION: Normal study. Electronically Signed   By: Rolm Baptise M.D.   On: 03/27/2022 00:32  ? ?CT CERVICAL SPINE WO CONTRAST ? ?Result Date: 03/27/2022 ?CLINICAL DATA:  Polytrauma, blunt.  Fall. EXAM: CT CERVICAL SPINE WITHOUT CONTRAST TECHNIQUE: Multidetector CT imaging of the cervical spine was performed without intravenous contrast. Multiplanar CT image reconstructions were also generated. RADIATION DOSE REDUCTION: This exam was performed according to the departmental dose-optimization program which includes automated exposure control, adjustment of the mA and/or kV according to patient size and/or use of iterative reconstruction technique. COMPARISON:  None Available. FINDINGS: Alignment: Normal Skull base and vertebrae: No acute fracture. No primary bone lesion or focal pathologic process. Soft tissues and spinal canal: No prevertebral fluid or swelling. No visible canal hematoma. Disc levels: Early degenerative disc disease from C4-5 through C6-7 with disc space narrowing and spurring. Mild bilateral degenerative facet disease, left greater than right. Upper chest: No acute findings Other: None IMPRESSION: No acute bony abnormality. Electronically Signed   By: Rolm Baptise M.D.   On: 03/27/2022 00:33  ? ?DG Pelvis Portable ? ?Result Date: 03/27/2022 ?CLINICAL DATA:  Status post  fall. EXAM: PORTABLE PELVIS 1-2 VIEWS COMPARISON:  None Available. FINDINGS: There is no evidence of pelvic fracture or diastasis. No pelvic bone lesions are seen. IMPRESSION: Negative. Electronically Signed   By: Virgina Norfolk M.D.   On: 03/27/2022 00:17  ? ?CT CHEST ABDOMEN PELVIS W CONTRAST ? ?Result Date: 03/27/2022 ?CLINICAL DATA:  Polytrauma, blunt [id number].  Fall. EXAM: CT CHEST, ABDOMEN, AND PELVIS WITH CONTRAST TECHNIQUE: Multidetector CT imaging of the chest, abdomen and pelvis was performed following the standard protocol during bolus administration of intravenous contrast. RADIATION DOSE REDUCTION: This exam was performed according to the departmental dose-optimization program which includes automated exposure control, adjustment of the mA and/or kV according to patient size and/or use of iterative reconstruction technique. CONTRAST:  13mL OMNIPAQUE IOHEXOL 350 MG/ML SOLN COMPARISON:  03/21/2022 FINDINGS: CT CHEST FINDINGS Cardiovascular: Prior CABG. Heart is normal size. Aorta is normal caliber. Mediastinum/Nodes: No mediastinal, hilar, or axillary adenopathy. Trachea and esophagus are unremarkable. Thyroid unremarkable. Lungs/Pleura: Dependent atelectasis or scarring posteriorly in the lower lobes. No effusions or pneumothorax. No confluent airspace opacities. Musculoskeletal: Chest wall soft tissues are unremarkable. No acute bony abnormality. CT ABDOMEN PELVIS FINDINGS Hepatobiliary: No hepatic injury or perihepatic hematoma. Diffuse low-density throughout the  liver compatible with fatty infiltration. Prior cholecystectomy. Pancreas: No focal abnormality or ductal dilatation. Spleen: No splenic injury or perisplenic hematoma. Adrenals/Urinary Tract: No adrenal hemorrhage or renal injury identified. Bladder is unremarkable. Stomach/Bowel: Normal appendix. Stomach, large and small bowel grossly unremarkable. Vascular/Lymphatic: Scattered aortic atherosclerosis. No evidence of aneurysm or adenopathy.  Reproductive: No visible focal abnormality. Other: No free fluid or free air. Musculoskeletal: No acute bony abnormality. IMPRESSION: No acute findings or evidence of significant traumatic injury in the chest, abdomen

## 2022-03-28 NOTE — TOC CAGE-AID Note (Signed)
Transition of Care (TOC) - CAGE-AID Screening ? ? ?Patient Details  ?Name: Brian Cummings ?MRN: [id number] ?Date of Birth: Feb 23, 1956 ? ?Transition of Care (TOC) CM/SW Contact:    ?Eder Macek C Tarpley-Carter, LCSWA ?Phone Number: ?03/28/2022, 12:44 PM ? ? ?Clinical Narrative: ?Pt participated in Cage-Aid.  Pt stated he does not use substance or ETOH.  Pt was not offered resources, due to no usage of substance or ETOH.   ? ?Insurance underwriter, MSW, LCSW-A ?Pronouns:  She/Her/Hers ?[facility] ?Clinical Social Worker ?Direct Number:  [phone] ?[email or web address]  ? ?CAGE-AID Screening: ?  ? ?Have You Ever Felt You Ought to Cut Down on Your Drinking or Drug Use?: No ?Have People Annoyed You By Critizing Your Drinking Or Drug Use?: No ?Have You Felt Bad Or Guilty About Your Drinking Or Drug Use?: No ?Have You Ever Had a Drink or Used Drugs First Thing In The Morning to Steady Your Nerves or to Get Rid of a Hangover?: No ?CAGE-AID Score: 0 ? ?Substance Abuse Education Offered: No ? ?  ? ? ? ? ? ? ?

## 2022-03-28 NOTE — H&P (View-Only) (Signed)
? ? Attending physician's note  ? ?I have taken a history, reviewed the chart, and examined the patient. I performed a substantive portion of this encounter, including complete performance of at least one of the key components, in conjunction with the APP. I agree with the APP's note, impression, and recommendations with my edits.  ? ?Hemoglobin stable at 8.9 (from 9 yesterday).  EGD yesterday with nonbleeding duodenal erosion, not felt to be culprit for acute bleeding event.  Does have a known history of diverticulosis on prior colonoscopy in 2021. ? ?- Plan for colonoscopy tomorrow for diagnostic and potentially therapeutic intent ?- Continue clears with n.p.o. at midnight ?- Starting bowel preparation this evening ?- Holding Coumadin ? ?The indications, risks, and benefits of colonoscopy were explained to the patient in detail. Risks include but are not limited to bleeding, perforation, adverse reaction to medications, and cardiopulmonary compromise. Sequelae include but are not limited to the possibility of surgery, prolonged hospitalization, and mortality. The patient verbalized understanding and wished to proceed. All questions answered. Further recommendations pending results of the exam.  ? ? ?[address], DO, FACG ?([phone] office  ? ?   ? ?        ? ?Daily Rounding Note ? ?03/28/2022, 2:19 PM ? LOS: 1 day  ? ?SUBJECTIVE:   ?Chief complaint:     ? ?Yesterday's stools were initially black with some leaching of blood.  Then black with no leaching of blood last night.  This morning at about 10 AM had a brown stool.  Feels well.  Wondering if celiac disease may be responsible for the GI bleed.  He is compliant with gluten-free diet. ? ?OBJECTIVE:        ? Vital signs in last 24 hours:    ?Temp:  [97.6 ?F (36.4 ?C)-98.3 ?F (36.8 ?C)] 97.9 ?F (36.6 ?C) (05/15 0800) ?Pulse Rate:  [63-79] 68 (05/15 0800) ?Resp:  [16-19] 19 (05/15 0454) ?BP:  (100-118)/(58-102) 103/64 (05/15 0800) ?SpO2:  [96 %-99 %] 96 % (05/15 0800) ?Weight:  [103.4 kg-106.2 kg] 103.4 kg (05/15 0454) ?Last BM Date : 03/28/22 ?Filed Weights  ? 03/27/22 0022 03/27/22 1735 03/28/22 0454  ?Weight: 106.6 kg 106.2 kg 103.4 kg  ? ?General: Looks well.  Comfortable. ?Heart: Telemetry monitor with A-fib rate 70. ?Chest: Clear bilaterally.  No labored breathing or cough. ?Abdomen: Not tender or distended.  Active bowel sounds. ?Extremities: No CCE. ?Neuro/Psych: Alert.  Affect normal, pleasant.  Fully oriented.  No gross deficits or tremors. ? ?Intake/Output from previous day: ?05/14 0701 - 05/15 0700 ?In: 537.3 [I.V.:400; IV Piggyback:137.3] ?Out: 1025 [Urine:825; Stool:200] ? ?Intake/Output this shift: ?Total I/O ?In: 240 [P.O.:240] ?Out: -  ? ?Lab Results: ?Recent Labs  ?  03/27/22 ?0002 03/27/22 ?0006 03/27/22 ?1811 03/27/22 ?2057 03/28/22 ?0131  ?WBC 6.9  --   --   --  6.2  ?HGB 11.4*   < > 9.1* 9.0* 8.9*  ?HCT 34.8*   < > 27.5* 27.8* 26.9*  ?PLT 178  --   --   --  144*  ? < > = values in this interval not displayed.  ? ?BMET ?Recent Labs  ?  03/27/22 ?0002 03/27/22 ?0006 03/27/22 ?0411 03/28/22 ?0131  ?NA 137 138 135 136  ?K 4.7 4.8 5.3* 4.0  ?CL 109 106 109 109  ?CO2 21*  --  22 23  ?GLUCOSE 177* 174* 205* 127*  ?BUN 38* 47* 36* 27*  ?CREATININE 1.72* 1.80* 1.52* 1.37*  ?CALCIUM 8.4*  --  8.6* 8.6*  ? ?LFT ?Recent Labs  ?  03/27/22 ?0002 03/27/22 ?0411  ?PROT 5.2* 5.3*  ?ALBUMIN 3.3* 3.3*  ?AST 25 18  ?ALT 24 24  ?ALKPHOS 21* 23*  ?BILITOT 0.5 0.7  ? ?PT/INR ?Recent Labs  ?  03/27/22 ?1811 03/28/22 ?0827  ?LABPROT 16.2* 14.8  ?INR 1.3* 1.2  ? ?Hepatitis Panel ?No results for input(s): HEPBSAG, HCVAB, HEPAIGM, HEPBIGM in the last 72 hours. ? ?Studies/Results: ?CT HEAD WO CONTRAST ? ?Result Date: 03/27/2022 ?CLINICAL DATA:  Head trauma, intracranial arterial injury suspected. Fall. EXAM: CT HEAD WITHOUT CONTRAST TECHNIQUE: Contiguous axial images were obtained from the base of the skull  through the vertex without intravenous contrast. RADIATION DOSE REDUCTION: This exam was performed according to the departmental dose-optimization program which includes automated exposure control, adjustment of the mA and/or kV according to patient size and/or use of iterative reconstruction technique. COMPARISON:  None Available. FINDINGS: Brain: No acute intracranial abnormality. Specifically, no hemorrhage, hydrocephalus, mass lesion, acute infarction, or significant intracranial injury. Vascular: No hyperdense vessel or unexpected calcification. Skull: No acute calvarial abnormality. Sinuses/Orbits: No acute findings Other: None IMPRESSION: Normal study. Electronically Signed   By: Rolm Baptise M.D.   On: 03/27/2022 00:32  ? ?CT CERVICAL SPINE WO CONTRAST ? ?Result Date: 03/27/2022 ?CLINICAL DATA:  Polytrauma, blunt.  Fall. EXAM: CT CERVICAL SPINE WITHOUT CONTRAST TECHNIQUE: Multidetector CT imaging of the cervical spine was performed without intravenous contrast. Multiplanar CT image reconstructions were also generated. RADIATION DOSE REDUCTION: This exam was performed according to the departmental dose-optimization program which includes automated exposure control, adjustment of the mA and/or kV according to patient size and/or use of iterative reconstruction technique. COMPARISON:  None Available. FINDINGS: Alignment: Normal Skull base and vertebrae: No acute fracture. No primary bone lesion or focal pathologic process. Soft tissues and spinal canal: No prevertebral fluid or swelling. No visible canal hematoma. Disc levels: Early degenerative disc disease from C4-5 through C6-7 with disc space narrowing and spurring. Mild bilateral degenerative facet disease, left greater than right. Upper chest: No acute findings Other: None IMPRESSION: No acute bony abnormality. Electronically Signed   By: Rolm Baptise M.D.   On: 03/27/2022 00:33  ? ?DG Pelvis Portable ? ?Result Date: 03/27/2022 ?CLINICAL DATA:  Status post  fall. EXAM: PORTABLE PELVIS 1-2 VIEWS COMPARISON:  None Available. FINDINGS: There is no evidence of pelvic fracture or diastasis. No pelvic bone lesions are seen. IMPRESSION: Negative. Electronically Signed   By: Virgina Norfolk M.D.   On: 03/27/2022 00:17  ? ?CT CHEST ABDOMEN PELVIS W CONTRAST ? ?Result Date: 03/27/2022 ?CLINICAL DATA:  Polytrauma, blunt [id number].  Fall. EXAM: CT CHEST, ABDOMEN, AND PELVIS WITH CONTRAST TECHNIQUE: Multidetector CT imaging of the chest, abdomen and pelvis was performed following the standard protocol during bolus administration of intravenous contrast. RADIATION DOSE REDUCTION: This exam was performed according to the departmental dose-optimization program which includes automated exposure control, adjustment of the mA and/or kV according to patient size and/or use of iterative reconstruction technique. CONTRAST:  74mL OMNIPAQUE IOHEXOL 350 MG/ML SOLN COMPARISON:  03/21/2022 FINDINGS: CT CHEST FINDINGS Cardiovascular: Prior CABG. Heart is normal size. Aorta is normal caliber. Mediastinum/Nodes: No mediastinal, hilar, or axillary adenopathy. Trachea and esophagus are unremarkable. Thyroid unremarkable. Lungs/Pleura: Dependent atelectasis or scarring posteriorly in the lower lobes. No effusions or pneumothorax. No confluent airspace opacities. Musculoskeletal: Chest wall soft tissues are unremarkable. No acute bony abnormality. CT ABDOMEN PELVIS FINDINGS Hepatobiliary: No hepatic injury or perihepatic hematoma. Diffuse low-density throughout the  liver compatible with fatty infiltration. Prior cholecystectomy. Pancreas: No focal abnormality or ductal dilatation. Spleen: No splenic injury or perisplenic hematoma. Adrenals/Urinary Tract: No adrenal hemorrhage or renal injury identified. Bladder is unremarkable. Stomach/Bowel: Normal appendix. Stomach, large and small bowel grossly unremarkable. Vascular/Lymphatic: Scattered aortic atherosclerosis. No evidence of aneurysm or adenopathy.  Reproductive: No visible focal abnormality. Other: No free fluid or free air. Musculoskeletal: No acute bony abnormality. IMPRESSION: No acute findings or evidence of significant traumatic injury in the chest, abdomen

## 2022-03-28 NOTE — Progress Notes (Signed)
?PROGRESS NOTE ? ? ? ?Brian Cummings  [id number] DOB: 1956-02-17 DOA: 03/26/2022 ?PCP: [facility] ? ? ?Brief Narrative:  ?66 year old white male history of coronary disease status post CABG, paroxysmal atrial fibrillation, history of DVT on Coumadin, CKD stage IIIa baseline creatinine 1.4, diet-controlled diabetes, history of celiac disease/IBS, BPH, hypertension who presented to the ER today with multiple episodes of syncope while at home.  Patient states that he was not feeling well today.  However he decided to take 60 mg of Viagra this evening around 5 PM.  Around 9:00 this evening, while going to the bathroom, he had a syncopal episode.  He states that while he was in the bathroom he had a bloody stool.  After he got up from the bathroom, he collapsed.  He hit his head against a metal cabinet.  His significant other was with him.  He managed to crawl to the living room.  He then had a another bloody bowel movement and passed out.  EMS was called.  While they were getting him into a wheelchair, he passed out again.  Reportedly his systolic blood pressure was in the 60s. ?  ?Patient reports that he has had recent changes to his medications.  He was seen by [facility] a couple weeks ago.  He was started on Rapaflo for his BPH.  Patient states that Flomax in the past this caused excessive hypotension. On arrival to the ER, temp 98.4 heart rate 72 blood pressure 82/64.  INR 4.8.  Creatinine 1.72.  Lactic acid 1.9.  Hemoglobin 7.4.  CT head chest and abdomen and pelvis as well as cervical spine were all negative for acute process. ? ?Assessment & Plan: ?  ?Principal Problem: ?  Syncope and collapse ?Active Problems: ?  Acute renal failure superimposed on stage 3a chronic kidney disease ([place]) ?  GI bleeding ?  Elevated INR ?  CAD (coronary artery disease), native coronary artery ?  BPH with obstruction/lower urinary tract symptoms ?  Celiac disease ?  Chronic kidney disease, stage 3a ([place]) ?   Essential hypertension ?  Diet-controlled diabetes mellitus ([place]) ?  History of DVT of lower extremity ?  Paroxysmal atrial fibrillation ([facility]) ?  Type 2 diabetes mellitus with diabetic chronic kidney disease ([place]) ?  IBS (irritable bowel syndrome)/chronic diarrhea ? ?Recurrent syncope and collapse: Likely due to combination of rectal bleeding as well as hypotension in the setting of initiation of new medication Rapaflo and agent effect of Viagra.  Blood pressure still on the borderline.  He has no further symptoms of syncope or dizziness.  All blood pressure medications as well as Rapaflo on hold. ? ?Hypotension: Takes Cozaar and amlodipine at home.  Per him, he does not check his blood pressure at home but previous to moving to [place] up until 5 months ago, he was checking his blood pressure and usually his systolic was running around [id number] and diastolic around 80 and this was despite of him taking amlodipine and Cozaar which she has been taking for 20 years.  Holding all antihypertensives.  Blood pressure borderline low. ? ?AKI on CKD stage IIIa: Baseline creatinine around 1.5.  Presented with 1.8.  Now back to baseline. ? ?Rectal bleeding/supratherapeutic INR/coagulopathy: Had few episodes of rectal bleeding at home and 1 episode here yesterday morning.  Likely in the setting of elevated INR which was 4.8 at the time of admission.Marland Kitchen  He received 2 units of FFP.  Seen by GI.  Interestingly, they performed his  EGD which was totally unremarkable.  Now he is going to have colonoscopy but not until tomorrow, per GI note.  Patient claims that he has had at least 3 more rectal bleeds in the last 24 hours.  Hemoglobin stable.  Continue to hold Coumadin. ? ?Type 2 diabetes mellitus: Hemoglobin A1c 7.3.  Continue SSI. ? ?Paroxysmal atrial fibrillation: Rates controlled.  Holding Coumadin as mentioned above. ? ?History of DVT of lower extremity and PE x2: Holding Coumadin. ? ?BPH with obstructive symptoms: Patient  has recently been started on Rapaflo.  Symptoms are controlled. ? ?History of CAD s/p CABG/hyperlipidemia: On Repatha for lipids. ? ?Morbid obesity: Weight loss counseling provided. ? ?DVT prophylaxis: SCDs Start: 03/27/22 0314 ?  Code Status: Full Code  ?Family Communication:  None present at bedside.  Plan of care discussed with patient in length and he/she verbalized understanding and agreed with it. ? ?Status is: Inpatient ?Remains inpatient appropriate because: Pending colonoscopy tomorrow morning. ? ? ? ?Estimated body mass index is 28.5 kg/m? as calculated from the following: ?  Height as of this encounter: 6\' 3"  (1.905 m). ?  Weight as of this encounter: 103.4 kg. ? ?  ?Nutritional Assessment: ?Body mass index is 28.5 kg/m?Marland KitchenMarland Kitchen ?Seen by dietician.  I agree with the assessment and plan as outlined below: ?Nutrition Status: ?  ?  ?  ? ?. ?Skin Assessment: ?I have examined the patient's skin and I agree with the wound assessment as performed by the wound care RN as outlined below: ?  ? ?Consultants:  ?GI ? ?Procedures:  ?None ? ?Antimicrobials:  ?Anti-infectives (From admission, onward)  ? ? None  ? ?  ?  ? ? ?Subjective: ? ?Seen and examined.  Still had 3 rectal bleeds in last 24 hours.  No other complaint. ? ?Objective: ?Vitals:  ? 03/27/22 1735 03/27/22 1936 03/28/22 0454 03/28/22 0800  ?BP:  117/68 100/71 103/64  ?Pulse:  73 72 68  ?Resp:  18 19   ?Temp:  97.8 ?F (36.6 ?C) 98 ?F (36.7 ?C) 97.9 ?F (36.6 ?C)  ?TempSrc:  Oral Oral Oral  ?SpO2:  97% 97% 96%  ?Weight: 106.2 kg  103.4 kg   ?Height:      ? ? ?Intake/Output Summary (Last 24 hours) at 03/28/2022 1017 ?Last data filed at 03/28/2022 0800 ?Gross per 24 hour  ?Intake 777.31 ml  ?Output 400 ml  ?Net 377.31 ml  ? ? ?Filed Weights  ? 03/27/22 0022 03/27/22 1735 03/28/22 0454  ?Weight: 106.6 kg 106.2 kg 103.4 kg  ? ? ?Examination: ? ?General exam: Appears calm and comfortable, morbidly obese ?Respiratory system: Clear to auscultation. Respiratory effort  normal. ?Cardiovascular system: S1 & S2 heard, RRR. No JVD, murmurs, rubs, gallops or clicks. No pedal edema. ?Gastrointestinal system: Abdomen is nondistended, soft and nontender. No organomegaly or masses felt. Normal bowel sounds heard. ?Central nervous system: Alert and oriented. No focal neurological deficits. ?Extremities: Symmetric 5 x 5 power. ?Skin: No rashes, lesions or ulcers.  ?Psychiatry: Judgement and insight appear normal. Mood & affect appropriate.  ? ? ?Data Reviewed: I have personally reviewed following labs and imaging studies ? ?CBC: ?Recent Labs  ?Lab 03/27/22 ?0002 03/27/22 ?0006 03/27/22 ?0411 03/27/22 ?1811 03/27/22 ?2057 03/28/22 ?0131  ?WBC 6.9  --   --   --   --  6.2  ?NEUTROABS  --   --   --   --   --  2.9  ?HGB 11.4* 11.2* 11.0* 9.1* 9.0* 8.9*  ?HCT 34.8*  33.0* 32.3* 27.5* 27.8* 26.9*  ?MCV 90.4  --   --   --   --  88.5  ?PLT 178  --   --   --   --  144*  ? ? ?Basic Metabolic Panel: ?Recent Labs  ?Lab 03/27/22 ?0002 03/27/22 ?0006 03/27/22 ?0411 03/28/22 ?0131  ?NA 137 138 135 136  ?K 4.7 4.8 5.3* 4.0  ?CL 109 106 109 109  ?CO2 21*  --  22 23  ?GLUCOSE 177* 174* 205* 127*  ?BUN 38* 47* 36* 27*  ?CREATININE 1.72* 1.80* 1.52* 1.37*  ?CALCIUM 8.4*  --  8.6* 8.6*  ?MG  --   --  1.8  --   ? ? ?GFR: ?Estimated Creatinine Clearance: 68.2 mL/min (A) (by C-G formula based on SCr of 1.37 mg/dL (H)). ?Liver Function Tests: ?Recent Labs  ?Lab 03/27/22 ?0002 03/27/22 ?0411  ?AST 25 18  ?ALT 24 24  ?ALKPHOS 21* 23*  ?BILITOT 0.5 0.7  ?PROT 5.2* 5.3*  ?ALBUMIN 3.3* 3.3*  ? ? ?No results for input(s): LIPASE, AMYLASE in the last 168 hours. ?No results for input(s): AMMONIA in the last 168 hours. ?Coagulation Profile: ?Recent Labs  ?Lab 03/27/22 ?0002 03/27/22 ?[id number] 03/27/22 ?1811 03/28/22 ?0827  ?INR 4.8* 2.6* 1.3* 1.2  ? ? ?Cardiac Enzymes: ?No results for input(s): CKTOTAL, CKMB, CKMBINDEX, TROPONINI in the last 168 hours. ?BNP (last 3 results) ?No results for input(s): PROBNP in the last 8760  hours. ?HbA1C: ?Recent Labs  ?  03/27/22 ?0411  ?HGBA1C 7.3*  ? ? ?CBG: ?Recent Labs  ?Lab 03/26/22 ?2356 03/27/22 ?0739 03/27/22 ?1129 03/27/22 ?2116 03/28/22 ?0759  ?GLUCAP 175* 169* 168* 164* 153*  ? ? ?Lipid Pr

## 2022-03-29 ENCOUNTER — Encounter (HOSPITAL_COMMUNITY): Payer: Self-pay | Admitting: Internal Medicine

## 2022-03-29 ENCOUNTER — Other Ambulatory Visit (HOSPITAL_COMMUNITY): Payer: Self-pay

## 2022-03-29 ENCOUNTER — Inpatient Hospital Stay (HOSPITAL_COMMUNITY): Payer: Medicare Other | Admitting: Anesthesiology

## 2022-03-29 ENCOUNTER — Encounter (HOSPITAL_COMMUNITY)
Admission: EM | Disposition: A | Discharge status: Home / Self Care | Payer: Self-pay | Source: Home / Self Care | Attending: Family Medicine

## 2022-03-29 ENCOUNTER — Telehealth: Payer: Self-pay

## 2022-03-29 DIAGNOSIS — K641 Second degree hemorrhoids: Secondary | ICD-10-CM

## 2022-03-29 DIAGNOSIS — K648 Other hemorrhoids: Secondary | ICD-10-CM

## 2022-03-29 DIAGNOSIS — R55 Syncope and collapse: Secondary | ICD-10-CM | POA: Diagnosis not present

## 2022-03-29 DIAGNOSIS — I2510 Atherosclerotic heart disease of native coronary artery without angina pectoris: Secondary | ICD-10-CM

## 2022-03-29 DIAGNOSIS — D62 Acute posthemorrhagic anemia: Secondary | ICD-10-CM

## 2022-03-29 DIAGNOSIS — K269 Duodenal ulcer, unspecified as acute or chronic, without hemorrhage or perforation: Secondary | ICD-10-CM

## 2022-03-29 DIAGNOSIS — K5731 Diverticulosis of large intestine without perforation or abscess with bleeding: Secondary | ICD-10-CM

## 2022-03-29 HISTORY — PX: COLONOSCOPY WITH PROPOFOL: SHX5780

## 2022-03-29 LAB — CBC WITH DIFFERENTIAL/PLATELET
Abs Immature Granulocytes: 0.07 10*3/uL (ref 0.00–0.07)
Basophils Absolute: 0.1 10*3/uL (ref 0.0–0.1)
Basophils Relative: 1 %
Eosinophils Absolute: 0.3 10*3/uL (ref 0.0–0.5)
Eosinophils Relative: 6 %
HCT: 28 % — ABNORMAL LOW (ref 39.0–52.0)
Hemoglobin: 9.1 g/dL — ABNORMAL LOW (ref 13.0–17.0)
Immature Granulocytes: 1 %
Lymphocytes Relative: 33 %
Lymphs Abs: 1.9 10*3/uL (ref 0.7–4.0)
MCH: 28.9 pg (ref 26.0–34.0)
MCHC: 32.5 g/dL (ref 30.0–36.0)
MCV: 88.9 fL (ref 80.0–100.0)
Monocytes Absolute: 0.6 10*3/uL (ref 0.1–1.0)
Monocytes Relative: 11 %
Neutro Abs: 2.7 10*3/uL (ref 1.7–7.7)
Neutrophils Relative %: 48 %
Platelets: 162 10*3/uL (ref 150–400)
RBC: 3.15 MIL/uL — ABNORMAL LOW (ref 4.22–5.81)
RDW: 14.3 % (ref 11.5–15.5)
WBC: 5.6 10*3/uL (ref 4.0–10.5)
nRBC: 0 % (ref 0.0–0.2)

## 2022-03-29 LAB — BASIC METABOLIC PANEL
Anion gap: 9 (ref 5–15)
BUN: 19 mg/dL (ref 8–23)
CO2: 23 mmol/L (ref 22–32)
Calcium: 9.2 mg/dL (ref 8.9–10.3)
Chloride: 109 mmol/L (ref 98–111)
Creatinine, Ser: 1.4 mg/dL — ABNORMAL HIGH (ref 0.61–1.24)
GFR, Estimated: 55 mL/min — ABNORMAL LOW (ref >60)
Glucose, Bld: 133 mg/dL — ABNORMAL HIGH (ref 70–99)
Potassium: 3.9 mmol/L (ref 3.5–5.1)
Sodium: 141 mmol/L (ref 135–145)

## 2022-03-29 LAB — GLUCOSE, CAPILLARY
Glucose-Capillary: 142 mg/dL — ABNORMAL HIGH (ref 70–99)
Glucose-Capillary: 126 mg/dL — ABNORMAL HIGH (ref 70–99)

## 2022-03-29 LAB — PROTIME-INR
INR: 1.2 (ref 0.8–1.2)
Prothrombin Time: 14.7 seconds (ref 11.4–15.2)

## 2022-03-29 SURGERY — COLONOSCOPY WITH PROPOFOL
Anesthesia: Monitor Anesthesia Care

## 2022-03-29 MED ORDER — PROPOFOL 10 MG/ML IV BOLUS
INTRAVENOUS | Status: DC | PRN
  Administered 2022-03-29: 20 mg via INTRAVENOUS
  Administered 2022-03-29: 30 mg via INTRAVENOUS

## 2022-03-29 MED ORDER — APIXABAN 5 MG PO TABS
5.0000 mg | ORAL_TABLET | Freq: Two times a day (BID) | ORAL | 0 refills | Status: AC
  Filled 2022-03-29: qty 60, 30d supply, fill #0

## 2022-03-29 MED ORDER — PROPOFOL 500 MG/50ML IV EMUL
INTRAVENOUS | Status: DC | PRN
  Administered 2022-03-29: 100 ug/kg/min via INTRAVENOUS

## 2022-03-29 MED ORDER — LACTATED RINGERS IV SOLN: INTRAVENOUS | Status: DC | PRN

## 2022-03-29 MED ORDER — LIDOCAINE 2% (20 MG/ML) 5 ML SYRINGE
INTRAMUSCULAR | Status: DC | PRN
  Administered 2022-03-29: 40 mg via INTRAVENOUS

## 2022-03-29 SURGICAL SUPPLY — 22 items

## 2022-03-29 NOTE — Telephone Encounter (Signed)
-----   Message from Shellia Cleverly, DO sent at 03/29/2022  9:57 AM EDT ----- ?Expect d/c today or tomorrow.  ? ?Please order CBC in 7-10 days after hospital d/c and can f/u wit Dr. Myrtie Neither or one of the APPs in 4-6 weeks. Thanks.  ? ?

## 2022-03-29 NOTE — Op Note (Signed)
[facility] ?Patient Name: Brian Cummings ?Procedure Date : 03/29/2022 ?MRN: [id number] ?Attending MD: Gerrit Heck , MD ?Date of Birth: [birth year] ?CSN: [id number] ?Age: 66 ?Admit Type: Inpatient ?Procedure:                Colonoscopy ?Indications:              Hematochezia, Acute post hemorrhagic anemia ?Providers:                Gerrit Heck, MD, Glori Bickers, RN, Cletis Athens,  ?                          Technician, Claybon Jabs CRNA, CRNA ?Referring MD:              ?Medicines:                Monitored Anesthesia Care ?Complications:            No immediate complications. ?Estimated Blood Loss:     Estimated blood loss: none. ?Procedure:                Pre-Anesthesia Assessment: ?                          - Prior to the procedure, a History and Physical  ?                          was performed, and patient medications and  ?                          allergies were reviewed. The patient's tolerance of  ?                          previous anesthesia was also reviewed. The risks  ?                          and benefits of the procedure and the sedation  ?                          options and risks were discussed with the patient.  ?                          All questions were answered, and informed consent  ?                          was obtained. Prior Anticoagulants: The patient has  ?                          taken Coumadin (warfarin), last dose was 3 days  ?                          prior to procedure. ASA Grade Assessment: III - A  ?                          patient with severe systemic disease. After  ?  reviewing the risks and benefits, the patient was  ?                          deemed in satisfactory condition to undergo the  ?                          procedure. ?                          After obtaining informed consent, the colonoscope  ?                          was passed under direct vision. Throughout the  ?                          procedure, the patient's  blood pressure, pulse, and  ?                          oxygen saturations were monitored continuously. The  ?                          [id number]) Olympus colonoscope was  ?                          introduced through the anus and advanced to the the  ?                          terminal ileum. The colonoscopy was performed  ?                          without difficulty. The patient tolerated the  ?                          procedure well. The quality of the bowel  ?                          preparation was adequate. The terminal ileum,  ?                          ileocecal valve, appendiceal orifice, and rectum  ?                          were photographed. ?Scope In: 9:23:34 AM ?Scope Out: 9:38:45 AM ?Scope Withdrawal Time: 0 hours 12 minutes 38 seconds  ?Total Procedure Duration: 0 hours 15 minutes 11 seconds  ?Findings: ?     Hemorrhoids were found on perianal exam. ?     A few tiny, non-bleeding diverticula were found in the sigmoid colon.  ?     There was no bleeding or stigmata of recent bleeding noted. ?     The exam was otherwise normal throughout the remainder of the colon.  ?     There was no blood throughout the colon lumen and no stigmata of recent  ?     bleeding. ?     Non-bleeding internal hemorrhoids were found during retroflexion. The  ?  hemorrhoids were medium-sized and Grade II (internal hemorrhoids that  ?     prolapse but reduce spontaneously). ?     The terminal ileum appeared normal. ?Impression:               - Hemorrhoids found on perianal exam. ?                          - Mild, small mouther diverticulosis in the sigmoid  ?                          colon. ?                          - Non-bleeding internal hemorrhoids. ?                          - The examined portion of the ileum was normal. ?                          - No specimens collected. ?                          - There was no blood throughout the colon lumen and  ?                          no stigmata of recent  bleeding on this study. ?Recommendation:           - Return patient to hospital ward for possible  ?                          discharge same day. ?                          - Advance diet as tolerated. ?                          - Continue present medications. ?                          - Ok to resume anticoagulation. ?                          - Check CBC in 7-10 days after hospital discharge. ?                          - GI service will sign off at this time. Please do  ?                          not hesitate to contact the inpatient service with  ?                          additional questions or concerns. ?Procedure Code(s):        --- Professional --- ?                          [phone], Colonoscopy, flexible; diagnostic,  including  ?                          collection of specimen(s) by brushing or washing,  ?                          when performed (separate procedure) ?Diagnosis Code(s):        --- Professional --- ?                          K64.1, Second degree hemorrhoids ?                          K92.1, Melena (includes Hematochezia) ?                          D62, Acute posthemorrhagic anemia ?                          K57.30, Diverticulosis of large intestine without  ?                          perforation or abscess without bleeding ?CPT copyright 2019 [employer] Medical Association. All rights reserved. ?The codes documented in this report are preliminary and upon coder review may  ?be revised to meet current compliance requirements. ?Gerrit Heck, MD ?03/29/2022 9:55:43 AM ?Number of Addenda: 0 ?

## 2022-03-29 NOTE — Anesthesia Preprocedure Evaluation (Addendum)
Anesthesia Evaluation  ?Patient identified by MRN, date of birth, ID band ?Patient awake ? ? ? ?Reviewed: ?Allergy & Precautions, NPO status , Patient's Chart, lab work & pertinent test results ? ?History of Anesthesia Complications ?Negative for: history of anesthetic complications ? ?Airway ?Mallampati: IV ? ?TM Distance: >3 FB ?Neck ROM: Full ? ? ? Dental ? ?(+) Dental Advisory Given, Teeth Intact ?  ?Pulmonary ?PE ?  ?Pulmonary exam normal ? ? ? ? ? ? ? Cardiovascular ?hypertension, Pt. on medications ?+ CAD and + CABG  ?Normal cardiovascular exam ? ? ?'23 TTE - EF 55 to 60%. Left atrial size was mildly dilated. Trivial mitral valve regurgitation. There is mild dilatation of the aortic root, measuring 40 mm.  ? ?  ?Neuro/Psych ?PSYCHIATRIC DISORDERS Depression negative neurological ROS ?   ? GI/Hepatic ?Neg liver ROS, GERD  Controlled and Medicated, ?Celiac disease ? ?  ?Endo/Other  ?diabetes, Well Controlled, Type 2 ? Renal/GU ?CRFRenal disease  ? ?  ?Musculoskeletal ? ?Gout ?  ? Abdominal ?  ?Peds ? Hematology ? ?(+) Blood dyscrasia, anemia ,  ?On coumadin ?   ?Anesthesia Other Findings ? ? Reproductive/Obstetrics ? ?  ? ? ? ? ? ? ? ? ? ? ? ? ? ?  ?  ? ? ? ? ? ? ? ?Anesthesia Physical ?Anesthesia Plan ? ?ASA: 3 ? ?Anesthesia Plan: MAC  ? ?Post-op Pain Management:   ? ?Induction:  ? ?PONV Risk Score and Plan: 1 and Propofol infusion and Treatment may vary due to age or medical condition ? ?Airway Management Planned: Nasal Cannula and Natural Airway ? ?Additional Equipment: None ? ?Intra-op Plan:  ? ?Post-operative Plan:  ? ?Informed Consent: I have reviewed the patients History and Physical, chart, labs and discussed the procedure including the risks, benefits and alternatives for the proposed anesthesia with the patient or authorized representative who has indicated his/her understanding and acceptance.  ? ? ? ? ? ?Plan Discussed with: CRNA and Anesthesiologist ? ?Anesthesia  Plan Comments:   ? ? ? ? ? ?Anesthesia Quick Evaluation ? ?

## 2022-03-29 NOTE — Progress Notes (Signed)
Previous notes deleted due to wrong patient. ?

## 2022-03-29 NOTE — Anesthesia Postprocedure Evaluation (Signed)
Anesthesia Post Note ? ?Patient: [name] ? ?Procedure(s) Performed: COLONOSCOPY WITH PROPOFOL ? ?  ? ?Patient location during evaluation: PACU ?Anesthesia Type: MAC ?Level of consciousness: awake and alert ?Pain management: pain level controlled ?Vital Signs Assessment: post-procedure vital signs reviewed and stable ?Respiratory status: spontaneous breathing, nonlabored ventilation and respiratory function stable ?Cardiovascular status: stable and blood pressure returned to baseline ?Anesthetic complications: no ? ? ?No notable events documented. ? ?Last Vitals:  ?Vitals:  ? 03/29/22 1019 03/29/22 1039  ?BP: 102/70 114/78  ?Pulse: 62 60  ?Resp: 17 16  ?Temp: 36.6 ?C 36.5 ?C  ?SpO2: 100%   ?  ?Last Pain:  ?Vitals:  ? 03/29/22 1039  ?TempSrc: Oral  ?PainSc:   ? ? ?  ?  ?  ?  ?  ?  ? ?Brian Cummings ? ? ? ? ?

## 2022-03-29 NOTE — Discharge Instructions (Signed)

## 2022-03-29 NOTE — TOC Benefit Eligibility Note (Signed)
Patient Advocate Encounter ? ?Insurance verification completed.   ? ?The patient is currently admitted and upon discharge could be taking Eliquis 5 mg. ? ?The current 30 day co-pay is, $47.00.  ? ?The patient is insured through [employer] Medicare Part D  ? ? ? ?Janaia Kozel, CPhT ?Pharmacy Patient Advocate Specialist ? Pharmacy Patient Advocate Team ?Direct Number: [phone]  Fax: [phone] ? ? ? ? ? ?  ?

## 2022-03-29 NOTE — Anesthesia Procedure Notes (Signed)
Procedure Name: Beecher ?Date/Time: 03/29/2022 9:15 AM ?Performed by: Moshe Salisbury, CRNA ?Pre-anesthesia Checklist: Patient identified, Emergency Drugs available, Suction available and Patient being monitored ?Oxygen Delivery Method: Nasal cannula ?Placement Confirmation: positive ETCO2 ?Dental Injury: Teeth and Oropharynx as per pre-operative assessment  ? ? ? ? ?

## 2022-03-29 NOTE — Discharge Summary (Addendum)
PatientPhysician Discharge Summary  ?Brian Cummings [id number] DOB: [birth year] DOA: 03/26/2022 ? ?PCP: [facility] ? ?Admit date: 03/26/2022 ?Discharge date: 03/29/2022 ?30 Day Unplanned Readmission Risk Score   ? ?Flowsheet Row ED to Hosp-Admission (Current) from 03/26/2022 in [facility]  ?30 Day Unplanned Readmission Risk Score (%) 9.2 Filed at 03/29/2022 0801  ? ?  ? ? This score is the patient's risk of an unplanned readmission within 30 days of being discharged (0 -100%). The score is based on dignosis, age, lab data, medications, orders, and past utilization.   ?Low:  0-14.9   Medium: 15-21.9   High: 22-29.9   Extreme: 30 and above ? ?  ? ?  ? ? ? ?Admitted From: Home ?Disposition: Home ? ?Recommendations for Outpatient Follow-up:  ?Follow up with PCP in 1-2 weeks ?Please obtain BMP/CBC in one week ?Please follow up with your PCP on the following pending results: ?Unresulted Labs (From admission, onward)  ? ?  Start     Ordered  ? 03/27/22 0001  Ethanol  (Trauma Panel)  Once,   URGENT       ? 03/27/22 0001  ? ?  ?  ? ?  ?  ? ? ?Home Health: None ?Equipment/Devices: None ? ?Discharge Condition: Stable ?CODE STATUS: Full code ?Diet recommendation: Cardiac ? ?Subjective: Seen and examined after colonoscopy.  Wife at the bedside.  He is feeling well.  He was offered to eat lunch and see how he does before he goes home however he prefers to go home and in fact ate breakfast at a restaurant.  Further details below. ? ?Brief/Interim Summary: 66 year old white male history of coronary disease status post CABG, paroxysmal atrial fibrillation, history of DVT on Coumadin, CKD stage IIIa baseline creatinine 1.4, diet-controlled diabetes, history of celiac disease/IBS, BPH, hypertension who presented to the ER today with multiple episodes of syncope while at home.  Patient states that he was not feeling well today.  However he decided to take 60 mg of Viagra this evening around 5 PM.   Around 9:00 this evening, while going to the bathroom, he had a syncopal episode.  He states that while he was in the bathroom he had a bloody stool.  After he got up from the bathroom, he collapsed.  He hit his head against a metal cabinet.  His significant other was with him.  He managed to crawl to the living room.  He then had a another bloody bowel movement and passed out.  EMS was called.  While they were getting him into a wheelchair, he passed out again.  Reportedly his systolic blood pressure was in the 60s. ?  ?Patient reports that he has had recent changes to his medications.  [facility] was seen [facility] couple weeks [facility].  He was started on Rapaflo for his BPH.  Patient states that Flomax in the past this caused excessive hypotension. On arrival to the ER, temp 98.4 heart rate 72 blood pressure 82/64.  INR 4.8.  Creatinine 1.72.  Lactic acid 1.9.  Hemoglobin 7.4.  CT head chest and abdomen and pelvis as well as cervical spine were all negative for acute process.  He was admitted under hospitalist service, detailed hospitalization as below. ?  ?Recurrent syncope and collapse/hypotension: Likely due to combination of rectal bleeding as well as hypotension in the setting of initiation of new medication Rapaflo and effect of Viagra.  Both of his antihypertensives including amlodipine and losartan as well as  Rapaflo were held and despite of that, patient's blood pressure during this hospitalization remained low normal.  He did not have any dizziness or any syncope.  Based on this data, we mutually decided to discontinue his antihypertensives but we are resuming his Rapaflo for his BPH. ?  ?AKI on CKD stage IIIa: Baseline creatinine around 1.5.  Presented with 1.8.  Now back to baseline. ?  ?Rectal bleeding/supratherapeutic INR/coagulopathy: Had few episodes of rectal bleeding at home and a few here after hospitalization as well.  Likely in the setting of elevated INR which was 4.8 at the time of  admission.Marland Kitchen  He received 2 units of FFP.  Seen by GI.  Interestingly, they performed his EGD which was totally unremarkable except duodenal erosions but no active bleeding.  He then underwent colonoscopy today and was found to have internal hemorrhoids which were nonbleeding as well as diverticulosis, no active source of bleeding was found.  He was cleared from GI to go home and resume anticoagulation.  It is very likely that he may have bled from internal hemorrhoids or his diverticulosis in the setting of elevated INR which has normalized now.  Patient did have anemia, had some drop in hemoglobin but not below 7 and thus he did not require any blood transfusion.  Hemoglobin upon discharge is 9.1. ?  ?Type 2 diabetes mellitus: Hemoglobin A1c 7.3.  Resume home medications. ?  ?Paroxysmal atrial fibrillation: Rates controlled.  Coumadin was held during this hospitalization.  I have had very lengthy and detailed discussion with the patient and his wife today.  Interestingly, patient has been on Coumadin for 7 to 8 years, he has never tried NOACS and he did not seem to be aware of those medications either.  After I explained to him how those medications were, side effect profile and the benefit profile, patient decided to proceed with Eliquis and he will be discharged on 5 mg p.o. twice daily. ?  ?History of DVT of lower extremity and PE x2: Eliquis as above. ?  ?BPH with obstructive symptoms: Resume Rapaflo. ?  ?History of CAD s/p CABG/hyperlipidemia: On Repatha for lipids. ? ?Discharge plan was discussed with patient and/or family member and they verbalized understanding and agreed with it.  ?Discharge Diagnoses:  ?Principal Problem: ?  Syncope and collapse ?Active Problems: ?  Acute renal failure superimposed on stage 3a chronic kidney disease ([place]) ?  GI bleeding ?  Elevated INR ?  CAD (coronary artery disease), native coronary artery ?  BPH with obstruction/lower urinary tract symptoms ?  Celiac disease ?  Chronic  kidney disease, stage 3a ([place]) ?  Essential hypertension ?  Diet-controlled diabetes mellitus ([place]) ?  History of DVT of lower extremity ?  Paroxysmal atrial fibrillation (H[facility]) ?  Type 2 diabetes mellitus with diabetic chronic kidney disease ([place]) ?  IBS (irritable bowel syndrome)/chronic diarrhea ?  Warfarin-induced coagulopathy ([place]) ?  Acute blood loss anemia ?  Diverticulosis of colon without hemorrhage ?  Grade II internal hemorrhoids ?  Internal hemorrhoids ?  Duodenal erosion ? ? ? ?Discharge Instructions ? ?Discharge Instructions   ? ? Consult for [facility] Admission   Complete by: As directed ?  ? ?  ? ?Allergies as of 03/29/2022   ? ?   Reactions  ? Flomax [tamsulosin]   ? Gluten Meal   ? Other reaction(s): celiac disease  ? Statins   ? Other reaction(s): myalgia  ? ?  ? ?  ?Medication List  ?  ? ?  STOP taking these medications   ? ?amLODipine 5 MG tablet ?Commonly known as: NORVASC ?  ?losartan 50 MG tablet ?Commonly known as: COZAAR ?  ?warfarin 10 MG tablet ?Commonly known as: COUMADIN ?  ?warfarin 4 MG tablet ?Commonly known as: COUMADIN ?  ? ?  ? ?TAKE these medications   ? ?allopurinol 100 MG tablet ?Commonly known as: ZYLOPRIM ?Take 100 mg by mouth at bedtime. ?  ?apixaban 5 MG Tabs tablet ?Commonly known as: Eliquis ?Take 1 tablet (5 mg total) by mouth 2 (two) times daily. ?  ?CALCIUM-VITAMIN D PO ?Take 1 tablet by mouth at bedtime. ?  ?citalopram 10 MG tablet ?Commonly known as: CELEXA ?Take 10 mg by mouth daily. ?  ?famotidine 40 MG tablet ?Commonly known as: PEPCID ?Take 40 mg by mouth 2 (two) times daily. ?  ?ferrous sulfate 325 (65 FE) MG tablet ?Take 325 mg by mouth 2 (two) times daily with a meal. ?  ?fluocinonide cream 0.05 % ?Commonly known as: LIDEX ?Apply 1 application. topically daily as needed (dry patches on ear). ?  ?melatonin 1 MG Tabs tablet ?Take 1 mg by mouth at bedtime. ?  ?Multivitamin Adult Chew ?Chew 1 tablet by mouth daily. ?  ?nitroGLYCERIN 0.4 MG SL tablet ?Commonly  known as: NITROSTAT ?Place 0.4 mg under the tongue every 5 (five) minutes as needed for chest pain. ?  ?Repatha Pushtronex System 420 MG/3.5ML Soct ?Generic drug: Evolocumab with Infusor ?Inject 420 mg into the

## 2022-03-29 NOTE — Interval H&P Note (Signed)
History and Physical Interval Note: ? ?03/29/2022 ?8:51 AM ? ?Brian Cummings  has presented today for surgery, with the diagnosis of Black, followed by bloody stool.  Nonspecific colitis [place] colon[place] 2021..  The various methods of treatment have been discussed with the patient and family. After consideration of risks, benefits and other options for treatment, the patient has consented to  Procedure(s): ?COLONOSCOPY WITH PROPOFOL (N/A) as a surgical intervention.  The patient's history has been reviewed, patient examined, no change in status, stable for surgery.  I have reviewed the patient's chart and labs.  Questions were answered to the patient's satisfaction.   ? ? ?Brian Cummings V Sabir Charters ? ? ?

## 2022-03-29 NOTE — Progress Notes (Deleted)
CBG 420. MD notified by pager. Responded in person. Awaiting orders ?

## 2022-03-29 NOTE — Transfer of Care (Signed)
Immediate Anesthesia Transfer of Care Note ? ?Patient: Brian Cummings ? ?Procedure(s) Performed: COLONOSCOPY WITH PROPOFOL ? ?Patient Location: PACU ? ?Anesthesia Type:MAC ? ?Level of Consciousness: drowsy and patient cooperative ? ?Airway & Oxygen Therapy: Patient Spontanous Breathing and Patient connected to nasal cannula oxygen ? ?Post-op Assessment: Report given to RN, Post -op Vital signs reviewed and stable and Patient moving all extremities ? ?Post vital signs: Reviewed and stable ? ?Last Vitals:  ?Vitals Value Taken Time  ?BP 165/83 03/29/22 0948  ?Temp    ?Pulse 85 03/29/22 0950  ?Resp 15 03/29/22 0950  ?SpO2 87 % 03/29/22 0950  ?Vitals shown include unvalidated device data. ? ?Last Pain:  ?Vitals:  ? 03/29/22 0829  ?TempSrc: Oral  ?PainSc: 0-No pain  ?   ? ?Patients Stated Pain Goal: 0 (03/27/22 1936) ? ?Complications: No notable events documented. ?

## 2022-03-30 ENCOUNTER — Encounter (HOSPITAL_COMMUNITY): Payer: Self-pay | Admitting: Internal Medicine

## 2022-03-30 ENCOUNTER — Encounter (HOSPITAL_COMMUNITY): Payer: Self-pay | Admitting: Gastroenterology

## 2022-03-30 NOTE — Telephone Encounter (Signed)
Called and spoke with patient regarding recommendations below. Pt is aware that I will remind him about repeat labs next week. I provided him with the office address and lab location. Pt has been scheduled for a hospital follow up with Willette Cluster, NP on Wednesday, 05/04/22 at 1:30 pm. Pt is aware that I will mail his appt information, he confirmed address on file. Pt verbalized understanding of all information and had no concerns at the end of the call. ? ?Lab order and reminder in epic. ? ?Appt information sent to patient via MyChart and mailed on 03/30/22.  ?

## 2022-04-07 ENCOUNTER — Telehealth: Payer: Self-pay

## 2022-04-07 ENCOUNTER — Other Ambulatory Visit (INDEPENDENT_AMBULATORY_CARE_PROVIDER_SITE_OTHER): Payer: Medicare Other

## 2022-04-07 DIAGNOSIS — D62 Acute posthemorrhagic anemia: Secondary | ICD-10-CM

## 2022-04-07 LAB — CBC WITH DIFFERENTIAL/PLATELET
Basophils Absolute: 0.1 10*3/uL (ref 0.0–0.1)
Basophils Relative: 1.2 % (ref 0.0–3.0)
Eosinophils Absolute: 0.3 10*3/uL (ref 0.0–0.7)
Eosinophils Relative: 5.7 % — ABNORMAL HIGH (ref 0.0–5.0)
HCT: 30.3 % — ABNORMAL LOW (ref 39.0–52.0)
Hemoglobin: 10 g/dL — ABNORMAL LOW (ref 13.0–17.0)
Lymphocytes Relative: 25.1 % (ref 12.0–46.0)
Lymphs Abs: 1.4 10*3/uL (ref 0.7–4.0)
MCHC: 33 g/dL (ref 30.0–36.0)
MCV: 89.2 fl (ref 78.0–100.0)
Monocytes Absolute: 0.4 10*3/uL (ref 0.1–1.0)
Monocytes Relative: 7.2 % (ref 3.0–12.0)
Neutro Abs: 3.5 10*3/uL (ref 1.4–7.7)
Neutrophils Relative %: 60.8 % (ref 43.0–77.0)
Platelets: 242 10*3/uL (ref 150.0–400.0)
RBC: 3.39 Mil/uL — ABNORMAL LOW (ref 4.22–5.81)
RDW: 16.1 % — ABNORMAL HIGH (ref 11.5–15.5)
WBC: 5.8 10*3/uL (ref 4.0–10.5)

## 2022-04-07 NOTE — Telephone Encounter (Signed)
-----   Message from Yevette Edwards, RN sent at 03/30/2022 10:41 AM EDT ----- Regarding: Labs CBC due - order is in epic

## 2022-04-07 NOTE — Telephone Encounter (Signed)
Spoke with patient to remind him that he is due for repeat labs at this time. No appointment is necessary. Patient is aware that he can stop by the lab in the basement at his convenience between 7:30 AM - 5 PM. Pt states that he will be by today after breakfast. Patient verbalized understanding and had no concerns at the end of the call.

## 2022-04-14 ENCOUNTER — Other Ambulatory Visit (HOSPITAL_COMMUNITY): Payer: Self-pay

## 2022-04-14 ENCOUNTER — Telehealth (HOSPITAL_COMMUNITY): Payer: Self-pay

## 2022-04-14 NOTE — Telephone Encounter (Signed)
Pharmacy Transitions of Care Follow-up Telephone Call  Date of discharge: 03/29/22  Discharge Diagnosis: syncope/collapse  How have you been since you were released from the hospital? Patient doing well since discharge, no questions about meds at this time   Medication changes made at discharge:     START taking: Eliquis (apixaban)  STOP taking: amLODipine 5 MG tablet (NORVASC)  losartan 50 MG tablet (COZAAR)  warfarin 10 MG tablet (COUMADIN)  warfarin 4 MG tablet (COUMADIN)   Medication changes verified by the patient? Yes    Medication Accessibility:  Home Pharmacy: Not discussed, patient confirmed has refills at home pharmacy   Was the patient provided with refills on discharged medications? No   Have all prescriptions been transferred from [facility] to home pharmacy? N/A   Is the patient able to afford medications? Has insurance    Medication Review:  APIXABAN (ELIQUIS)  Apixaban 5 mg BID initiated on 03/29/22.  - Discussed importance of taking medication around the same time everyday  - Advised patient of medications to avoid (NSAIDs, ASA)  - Educated that Tylenol (acetaminophen) will be the preferred analgesic to prevent risk of bleeding  - Emphasized importance of monitoring for signs and symptoms of bleeding (abnormal bruising, prolonged bleeding, nose bleeds, bleeding from gums, discolored urine, black tarry stools)  - Advised patient to alert all providers of anticoagulation therapy prior to starting a new medication or having a procedure   Follow-up Appointments:  PCP Hospital f/u appt confirmed? Scheduled to see PCP on 04/21/22 @ primary care.   [facility] f/u appt confirmed? Scheduled to see Dr. Chester Holstein on 05/04/22 @ 1:30pm.   If their condition worsens, is the pt aware to call PCP or go to the Emergency Dept.? Yes  Final Patient Assessment: Patient has refills at home pharmacy and f/u scheduled

## 2022-05-04 ENCOUNTER — Encounter: Payer: Self-pay | Admitting: Nurse Practitioner

## 2022-05-04 ENCOUNTER — Ambulatory Visit (INDEPENDENT_AMBULATORY_CARE_PROVIDER_SITE_OTHER): Payer: Medicare Other | Admitting: Nurse Practitioner

## 2022-05-04 VITALS — BP 122/80 | HR 52 | Ht 73.75 in | Wt 236.5 lb

## 2022-05-04 DIAGNOSIS — I2510 Atherosclerotic heart disease of native coronary artery without angina pectoris: Secondary | ICD-10-CM | POA: Diagnosis not present

## 2022-05-04 DIAGNOSIS — K922 Gastrointestinal hemorrhage, unspecified: Secondary | ICD-10-CM | POA: Diagnosis not present

## 2022-05-04 NOTE — Progress Notes (Signed)
Chief complaint:  Hospital follow up  Assessment &  Plan   66 year old male with PMH significant for CAD status post CABG, DVT/PE, atrial fibrillation, chronic anticoagulation, GERD, celiac disease, chronic anemia,  chronic kidney disease, diabetes, diverticulosis  # Recent admission for GI bleed with hypotension / syncope in setting of warfarin with supratherapeutic INR. Diverticular bleed? Hemorrhoidal bleed seems unlikely given apparent hemodynamic instability. No evidence for upper GI source . Inpatient EGD and colonoscopy unrevealing.  Small bowel was not evaluated. Since hospital dicharge a few weeks ago he has had one minor episode of rectal bleeding which he thinks was hemorrhoidal.  Contact us for recurrent bleeding. If thought to be minor / hemorrhoidal in nature then might be candidate for internal hemorrhoid banding.   # Acute on chronic anemia with recent GI bleed. Has celiac disease and CKD probably contributing to chronic anemia.  He thinks hgb is ~ 12 at baseline on chronic oral iron.  Hgb declined to 9 with recent GI bleed but seems to be recovering, it was 10 at last check on 5/25.  Per patient he is scheduled for repeat CBC on 7/6.    # GERD. Treated with BID Pepcid.  He was having a lot of symptoms ( heartburn, gas) prior to recent hospitalization but they dong better after omission of caffeine from diet.   # Celiac disease, diagnosed in [place] years ago. Follows gluten free diet  # Chronic diarrhea, takes Cholestyramine and imodium as needed. Diarrhea has improved off caffeine.   # History of DVT / PE. Warfarin was discontinued after GI bleed. Now on Eliquis  HPI   Mr. Brian Cummings was hospitalized mid May with a GI bleed associated with hypotension / syncope. He takes warfarin and INR was 4.8.  He received FFP with improvement in INR . We saw him in consultation, I reviewed the consult/progress notes.  He was apparently having melenic stool followed by more  red-colored stool. Presenting hemoglobin 11.4 , subsequently declined to 8.9.   Inpatient EGD and colonoscopy were essentially unrevealing. He has been on oral iron for years. Hospital follow up hgb on 5/25 stable improved at 10.   Interval History:  Here for hospital follow up. No bleeding since discharge except for a small amount of blood with a BM on one particular day a few weeks ago after having several BMs. He has some minor discomfort at the end of the bowel movements and attributes the bleeding to a hemorrhoid. He still feels a little tired but no other complaints.    Previous GI Evaluation   May 2023 EGD and colonoscopy for GI bleed - Small duodenal erosion without bleeding  - Hemorrhoids found on perianal exam. - Mild, small mouth diverticulosis in the sigmoid colon. - Non-bleeding internal hemorrhoids. - The examined portion of the ileum was normal. - No specimens collected. - There was no blood throughout the colon lumen and no stigmata of recent bleeding on this study.  Labs:     Latest Ref Rng & Units 04/07/2022   10:49 AM 03/29/2022    4:02 AM 03/28/2022    1:31 AM  CBC  WBC 4.0 - 10.5 K/uL 5.8  5.6  6.2   Hemoglobin 13.0 - 17.0 g/dL 10.0  9.1  8.9   Hematocrit 39.0 - 52.0 % 30.3  28.0  26.9   Platelets 150.0 - 400.0 K/uL 242.0  162  144        Latest Ref Rng & Units 03/27/2022  4:11 AM 03/27/2022   12:02 AM  Hepatic Function  Total Protein 6.5 - 8.1 g/dL 5.3  5.2   Albumin 3.5 - 5.0 g/dL 3.3  3.3   AST 15 - 41 U/L 18  25   ALT 0 - 44 U/L 24  24   Alk Phosphatase 38 - 126 U/L 23  21   Total Bilirubin 0.3 - 1.2 mg/dL 0.7  0.5      Past Medical History:  Diagnosis Date   AF (paroxysmal atrial fibrillation) (HCC)    Celiac disease    Clotting disorder ([place])    Recurrent DVT and PE   Coronary artery disease    Diabetes ([place])    Hypercholesteremia    Hypertension    IBS (irritable bowel syndrome)    Pulmonary embolism ([place])    Renal disorder     Past  Surgical History:  Procedure Laterality Date   BYPASS GRAFT     CHOLECYSTECTOMY     COLONOSCOPY WITH PROPOFOL N/A 03/29/2022   Procedure: COLONOSCOPY WITH PROPOFOL;  Surgeon: Lavena Bullion, DO;  Location: [facility];  Service: Gastroenterology;  Laterality: N/A;   CORONARY ARTERY BYPASS GRAFT  12/30/2015   LIMA to LAD, SVG Y-graft to RI-OM 2, SVG to R PDA   CORONARY ARTERY BYPASS GRAFT     ESOPHAGOGASTRODUODENOSCOPY (EGD) WITH PROPOFOL N/A 03/27/2022   Procedure: ESOPHAGOGASTRODUODENOSCOPY (EGD) WITH PROPOFOL;  Surgeon: Doran Stabler, MD;  Location: [facility];  Service: Gastroenterology;  Laterality: N/A;   MELANOMA EXCISION Right    RIGHT EAR    Current Medications, Allergies, Family History and Social History were reviewed in [employer] record.     Current Outpatient Medications  Medication Sig Dispense Refill   allopurinol (ZYLOPRIM) 100 MG tablet Take 100 mg by mouth daily.     allopurinol (ZYLOPRIM) 100 MG tablet Take 100 mg by mouth at bedtime.     amLODipine (NORVASC) 5 MG tablet Take 1 tablet (5 mg total) by mouth daily. 90 tablet 3   apixaban (ELIQUIS) 5 MG TABS tablet Take 1 tablet (5 mg total) by mouth 2 (two) times daily. 60 tablet 0   calcium carbonate (OSCAL) 1500 (600 Ca) MG TABS tablet Take by mouth daily.     CALCIUM-VITAMIN D PO Take 1 tablet by mouth at bedtime.     Cholecalciferol (VITAMIN D3 PO) Take 1 tablet by mouth at bedtime. Unsure of dose     Cholecalciferol (VITAMIN D3) 1.25 MG (50000 UT) TABS Take 1 tablet by mouth daily.     cholestyramine (QUESTRAN) 4 g packet Take 4 g by mouth daily.     citalopram (CELEXA) 10 MG tablet Take 10 mg by mouth daily.     citalopram (CELEXA) 10 MG tablet Take 10 mg by mouth daily.     Coenzyme Q10 (COQ-10) 400 MG CAPS Take 1 capsule by mouth daily.     Evolocumab with Infusor ([place]) 420 MG/3.5ML SOCT Inject 1 Units into the skin every 28 (twenty-eight) days. 3.6 mL 12    famotidine (PEPCID) 40 MG tablet Take 40 mg by mouth daily.     famotidine (PEPCID) 40 MG tablet Take 40 mg by mouth 2 (two) times daily.     fenofibrate (TRICOR) 145 MG tablet Take 145 mg by mouth daily.     ferrous sulfate 325 (65 FE) MG EC tablet Take 325 mg by mouth 2 (two) times daily.     ferrous sulfate 325 (65  FE) MG tablet Take 325 mg by mouth 2 (two) times daily with a meal.     fluocinonide cream (LIDEX) 1.89 % Apply 1 application. topically daily as needed (dry patches on ear).     fluocinonide gel (LIDEX) 8.42 % Apply 1 application topically as needed.     hydrocortisone 2.5 % cream Apply 1 application topically as needed.     KRILL OIL PO Take 1 capsule by mouth daily.     losartan (COZAAR) 50 MG tablet Take 1 tablet (50 mg total) by mouth daily. 90 tablet 3   LUTEIN PO Take 1 capsule by mouth daily.     Magnesium 500 MG CAPS Take 1 capsule by mouth daily.     melatonin 1 MG TABS tablet Take 1 mg by mouth as needed.     melatonin 1 MG TABS tablet Take 1 mg by mouth at bedtime.     MULTIPLE VITAMIN PO Take 1 tablet by mouth daily.     Multiple Vitamins-Minerals (MULTIVITAMIN ADULT) CHEW Chew 1 tablet by mouth daily.     nitroGLYCERIN (NITROSTAT) 0.4 MG SL tablet Place 0.4 mg under the tongue every 5 (five) minutes as needed for chest pain.     nitroGLYCERIN (NITROSTAT) 0.4 MG SL tablet Place 0.4 mg under the tongue every 5 (five) minutes as needed for chest pain.     Polyethyl Glycol-Propyl Glycol (SYSTANE OP) Place 1 drop into both eyes in the morning and at bedtime.     [place] 420 MG/3.5ML SOCT Inject 420 mg into the skin every 28 (twenty-eight) days.     sildenafil (REVATIO) 20 MG tablet Take 40-100 mg by mouth daily as needed (ED).     silodosin (RAPAFLO) 4 MG CAPS capsule Take 4 mg by mouth at bedtime.     vitamin C (ASCORBIC ACID) 500 MG tablet Take 500 mg by mouth daily.     vitamin C (ASCORBIC ACID) 500 MG tablet Take 500 mg by mouth daily.     No  current facility-administered medications for this visit.    Review of Systems: No chest pain. No shortness of breath. No urinary complaints.    Physical Exam  Wt Readings from Last 3 Encounters:  03/29/22 222 lb 0.1 oz (100.7 kg)  12/30/21 230 lb (104.3 kg)  12/01/21 228 lb 12.8 oz (103.8 kg)    BP 122/80 (BP Location: Left Arm, Patient Position: Sitting, Cuff Size: Normal)   Pulse (!) 52 Comment: irregular  Ht 6' 1.75" (1.873 m) Comment: height measured without shoes  Wt 236 lb 8 oz (107.3 kg)   BMI 30.57 kg/m  Constitutional:  Generally well appearing male in no acute distress. Psychiatric: Pleasant. Normal mood and affect. Behavior is normal. EENT: Pupils normal.  Conjunctivae are normal. No scleral icterus. Neck supple.  Cardiovascular: Normal rate, regular rhythm. No edema Pulmonary/chest: Effort normal and breath sounds normal. No wheezing, rales or rhonchi. Abdominal: Soft, nondistended, nontender. Bowel sounds active throughout. There are no masses palpable. No hepatomegaly. Neurological: Alert and oriented to person place and time. Skin: Skin is warm and dry. No rashes noted.   Tye Savoy, NP  05/04/2022, 12:45 PM

## 2022-05-04 NOTE — Patient Instructions (Addendum)
If you are age 67 or older, your body mass index should be between 23-30. Your Body mass index is 30.57 kg/m. If this is out of the aforementioned range listed, please consider follow up with your Primary Care Provider.  If you are age 37 or younger, your body mass index should be between 19-25. Your Body mass index is 30.57 kg/m. If this is out of the aformentioned range listed, please consider follow up with your Primary Care Provider.   ________________________________________________________  The [facility] providers would like to encourage you to use [facility] to communicate with providers for non-urgent requests or questions.  Due to long hold times on the telephone, sending your provider a message by [facility] may be a faster and more efficient way to get a response.  Please allow 48 business hours for a response.  Please remember that this is for non-urgent requests.  _______________________________________________________  Follow up as needed. Please call the office if any GI bleeding symptoms reoccur.    It was a pleasure to see you today!  Thank you for trusting me with your gastrointestinal care!

## 2022-06-30 ENCOUNTER — Ambulatory Visit: Payer: Medicare Other | Admitting: Cardiology

## 2022-06-30 ENCOUNTER — Encounter: Payer: Self-pay | Admitting: Cardiology

## 2022-06-30 VITALS — BP 117/76 | HR 77 | Temp 98.6°F | Resp 16 | Ht 75.0 in | Wt 233.0 lb

## 2022-06-30 DIAGNOSIS — N1831 Chronic kidney disease, stage 3a: Secondary | ICD-10-CM

## 2022-06-30 DIAGNOSIS — I25118 Atherosclerotic heart disease of native coronary artery with other forms of angina pectoris: Secondary | ICD-10-CM

## 2022-06-30 DIAGNOSIS — I10 Essential (primary) hypertension: Secondary | ICD-10-CM

## 2022-06-30 DIAGNOSIS — D6859 Other primary thrombophilia: Secondary | ICD-10-CM

## 2022-06-30 NOTE — Progress Notes (Signed)
Primary Physician/Referring:  [name], MD  Patient ID: Brian Cummings, male    DOB: Mar 04, 1956, 66 y.o.   MRN: [id number]  No chief complaint on file.  HPI:    Brian Cummings  is a 66 y.o. Caucasian male patient with CAD SP CABG in 2017, hypertension, hyperlipidemia, chronic stage IIIa kidney disease, diet-controlled diabetes mellitus, history of recurrent DVT and pulmonary embolism and also paroxysmal atrial fibrillation, hence has been on chronic anticoagulation therapy, he has had extensive hematology evaluation including at [facility] with no etiology found for his hypercoagulable state.  Hence was advised to continue anticoagulation indefinitely.  Admitted in May 2023 with syncope and collapse after severe lower GI bleed and hematuria and hypotension.  Symptoms are resolved, warfarin has been switched over to Eliquis.  Patient states that he is presently doing well and has not had any chest pain or dyspnea on exertion, PND or orthopnea or leg edema.  He has not had any bleeding diathesis on Eliquis.  Past Medical History:  Diagnosis Date   AF (paroxysmal atrial fibrillation) (HCC)    Celiac disease    Clotting disorder ([place])    Recurrent DVT and PE   Coronary artery disease    Diabetes ([place])    Hypercholesteremia    Hypertension    IBS (irritable bowel syndrome)    Pulmonary embolism ([place])    Renal disorder    Past Surgical History:  Procedure Laterality Date   BYPASS GRAFT     CHOLECYSTECTOMY     COLONOSCOPY WITH PROPOFOL N/A 03/29/2022   Procedure: COLONOSCOPY WITH PROPOFOL;  Surgeon: Lavena Bullion, DO;  Location: [place];  Service: Gastroenterology;  Laterality: N/A;   CORONARY ARTERY BYPASS GRAFT  12/30/2015   LIMA to LAD, SVG Y-graft to RI-OM 2, SVG to R PDA   CORONARY ARTERY BYPASS GRAFT     ESOPHAGOGASTRODUODENOSCOPY (EGD) WITH PROPOFOL N/A 03/27/2022   Procedure: ESOPHAGOGASTRODUODENOSCOPY (EGD) WITH PROPOFOL;  Surgeon: Doran Stabler, MD;   Location: [place];  Service: Gastroenterology;  Laterality: N/A;   MELANOMA EXCISION Right    RIGHT EAR   Family History  Problem Relation Age of Onset   Aneurysm Mother 5   Lung disease Father 17   Gout Brother     Social History   Tobacco Use   Smoking status: Never   Smokeless tobacco: Never  Substance Use Topics   Alcohol use: Not Currently    Comment: Rarely   Marital Status:    ROS  Review of Systems  Cardiovascular:  Negative for chest pain, dyspnea on exertion and leg swelling.  Gastrointestinal:  Negative for melena.   Objective  Blood pressure 117/76, pulse 77, temperature 98.6 F (37 C), temperature source Temporal, resp. rate 16, height _0  (1.905 m), weight 233 lb (105.7 kg), SpO2 97 %. Body mass index is 29.12 kg/m.     06/30/2022    1:10 PM 05/04/2022    1:22 PM 03/29/2022   10:39 AM  Vitals with BMI  Height _1  6' 1.75"   Weight 233 lbs 236 lbs 8 oz   BMI 44.96 75.91   Systolic 638 466 599  Diastolic 76 80 78  Pulse 77 52 60    Physical Exam Neck:     Vascular: No carotid bruit or JVD.  Cardiovascular:     Rate and Rhythm: Normal rate and regular rhythm.     Pulses: Intact distal pulses.     Heart sounds: Normal heart sounds.  No murmur heard.    No gallop.  Pulmonary:     Effort: Pulmonary effort is normal.     Breath sounds: Normal breath sounds.  Abdominal:     General: Bowel sounds are normal.     Palpations: Abdomen is soft.  Musculoskeletal:     Right lower leg: No edema.     Left lower leg: No edema.     Laboratory examination:   Lab Results  Component Value Date   NA 141 03/29/2022   K 3.9 03/29/2022   CO2 23 03/29/2022   GLUCOSE 133 (H) 03/29/2022   BUN 19 03/29/2022   CREATININE 1.40 (H) 03/29/2022   CALCIUM 9.2 03/29/2022   EGFR 53 (L) 01/27/2022   GFRNONAA 55 (L) 03/29/2022       Latest Ref Rng & Units 03/29/2022    4:02 AM 03/28/2022    1:31 AM 03/27/2022    4:11 AM  CMP  Glucose 70 - 99 mg/dL 133  127   205   BUN 8 - 23 mg/dL 19  27  36   Creatinine 0.61 - 1.24 mg/dL 1.40  1.37  1.52   Sodium 135 - 145 mmol/L 141  136  135   Potassium 3.5 - 5.1 mmol/L 3.9  4.0  5.3   Chloride 98 - 111 mmol/L 109  109  109   CO2 22 - 32 mmol/L _0 Calcium 8.9 - 10.3 mg/dL 9.2  8.6  8.6   Total Protein 6.5 - 8.1 g/dL   5.3   Total Bilirubin 0.3 - 1.2 mg/dL   0.7   Alkaline Phos 38 - 126 U/L   23   AST 15 - 41 U/L   18   ALT 0 - 44 U/L   24       Latest Ref Rng & Units 04/07/2022   10:49 AM 03/29/2022    4:02 AM 03/28/2022    1:31 AM  CBC  WBC 4.0 - 10.5 K/uL 5.8  5.6  6.2   Hemoglobin 13.0 - 17.0 g/dL 10.0  9.1  8.9   Hematocrit 39.0 - 52.0 % 30.3  28.0  26.9   Platelets 150.0 - 400.0 K/uL 242.0  162  144    External labs:   Labs 06/22/2022:  Total cholesterol 74, triglycerides 226, HDL 34, LDL 40.  A1c 7.8%.  Hb 14.3/HCT 42.8, platelets 208.  LFTs normal.  BUN 24, creatinine 1.62, EGFR 43 mL, potassium 4.5.  Urinalysis normal without albuminuria.  Lab 12/23/2021:  A1c 6.9%.  Total cholesterol 117, triglycerides 137, HDL 39, LDL 51.  Vitamin D 46.3.  Sodium 142, potassium 4.6, BUN 23, creatinine 1.48, EGFR 48 mL, LFT normal.  Medications and allergies   Allergies  Allergen Reactions   Flomax [Tamsulosin]    Gluten Meal     Other reaction(s): celiac disease   Statins     Muscle aches   Statins     Other reaction(s): myalgia    Medication list after today's encounter    Current Outpatient Medications:    allopurinol (ZYLOPRIM) 100 MG tablet, Take 100 mg by mouth at bedtime., Disp: , Rfl:    amLODipine (NORVASC) 5 MG tablet, Take 1 tablet by mouth daily., Disp: , Rfl:    apixaban (ELIQUIS) 5 MG TABS tablet, Take 1 tablet (5 mg total) by mouth 2 (two) times daily., Disp: 60 tablet, Rfl: 0   calcium carbonate (SUPER CALCIUM) 1500 (600 Ca) MG TABS tablet,  Take 1 tablet by mouth daily., Disp: , Rfl:    Cholecalciferol (VITAMIN D3 PO), Take 1 tablet by mouth at bedtime.  Unsure of dose, Disp: , Rfl:    cholestyramine (QUESTRAN) 4 g packet, Take 4 g by mouth daily., Disp: , Rfl:    citalopram (CELEXA) 10 MG tablet, Take 10 mg by mouth daily., Disp: , Rfl:    Coenzyme Q10 (COQ-10) 400 MG CAPS, Take 1 capsule by mouth daily., Disp: , Rfl:    Evolocumab with Infusor ([place]) 420 MG/3.5ML SOCT, Inject 1 Units into the skin every 28 (twenty-eight) days., Disp: 3.6 mL, Rfl: 12   famotidine (PEPCID) 40 MG tablet, Take 40 mg by mouth 2 (two) times daily., Disp: , Rfl:    fenofibrate (TRICOR) 145 MG tablet, Take 145 mg by mouth daily., Disp: , Rfl:    ferrous sulfate 325 (65 FE) MG tablet, Take 325 mg by mouth 2 (two) times daily with a meal., Disp: , Rfl:    fluocinonide cream (LIDEX) 7.84 %, Apply 1 application. topically daily as needed (dry patches on ear)., Disp: , Rfl:    hydrocortisone 2.5 % cream, Apply 1 application topically as needed., Disp: , Rfl:    KRILL OIL PO, Take 1 capsule by mouth daily., Disp: , Rfl:    losartan (COZAAR) 25 MG tablet, Take 25 mg by mouth daily., Disp: , Rfl:    LUTEIN PO, Take 1 capsule by mouth daily., Disp: , Rfl:    melatonin 1 MG TABS tablet, Take 1 mg by mouth at bedtime., Disp: , Rfl:    metFORMIN (GLUCOPHAGE) 500 MG tablet, Take 500 mg by mouth 2 (two) times daily with a meal. Twice a day, Disp: , Rfl:    MULTIPLE VITAMIN PO, Take 1 tablet by mouth daily., Disp: , Rfl:    nitroGLYCERIN (NITROSTAT) 0.4 MG SL tablet, Place 0.4 mg under the tongue every 5 (five) minutes as needed for chest pain., Disp: , Rfl:    Polyethyl Glycol-Propyl Glycol (SYSTANE OP), Place 1 drop into both eyes in the morning and at bedtime., Disp: , Rfl:    silodosin (RAPAFLO) 4 MG CAPS capsule, Take 4 mg by mouth at bedtime., Disp: , Rfl:    vitamin C (ASCORBIC ACID) 500 MG tablet, Take 500 mg by mouth 2 (two) times daily., Disp: , Rfl:   Radiology:   No results found.  Cardiac Studies:   Coronary angiogram 05/11/2018: LM: Large,  mild disease. LAD proximal LAD 100% stenosed.  LIMA to LAD widely patent. RI: 100% stenosed. CX: OM 2 large 100% stenosed. SVG Y-graft to RI and OM 2 widely patent. Proximal RCA mild disease, ostial right PDA 100% stenosed.  SVG to PDA widely patent. LVEF 60%.  Holter monitor 09/18/2019: Minimum heart rate 50 bpm, average heart rate 67 bpm, rare PVCs and PACs. Brief atrial tachycardia, longest 5 beats.  Symptoms correlated with normal sinus rhythm.  Dobutamine stress echocardiogram 03/09/2021: EKG: Normal sinus rhythm.  Occasional PACs with dobutamine infusion. No dobutamine induced LV dilatation or wall motion abnormality.  Normal LV size and function.  Abdominal aortic duplex 2022: Proximal abdominal dimensions 2.1 x 2.5 cm, mid abdominal aorta measures 2.8 x 2.3 cm, distal aorta measures 2.4 x 2.7 cm.  Common iliac arteries measure approximately 1.4 cm.  No aneurysmal dilatation of the abdominal aorta is seen.  Echocardiogram 03/27/2022:  1. Left ventricular ejection fraction, by estimation, is 55 to 60%. The left ventricle has normal function. The left  ventricle has no regional wall motion abnormalities. Left ventricular diastolic parameters were normal.  2. Right ventricular systolic function is low normal. The right ventricular size is normal. There is normal pulmonary artery systolic pressure. The estimated right ventricular systolic pressure is 31.5 mmHg.  3. Left atrial size was mildly dilated.  4. The mitral valve is normal in structure. Trivial mitral valve regurgitation. No evidence of mitral stenosis.  5. The aortic valve is tricuspid. Aortic valve regurgitation is not visualized. No aortic stenosis is present.  6. Aortic dilatation noted. There is mild dilatation of the aortic root, measuring 40 mm.  EKG:   EKG 12/01/2021: Normal sinus rhythm at rate of 69 bpm, normal axis.  Nonspecific high lateral T abnormality.    Assessment     ICD-10-CM   1. Coronary artery disease of  native artery of native heart with stable angina pectoris ([place])  I25.118     2. Primary hypertension  I10 EKG 12-Lead    3. Stage 3a chronic kidney disease (HCC)  N18.31     4. Primary hypercoagulable state ([place])  D68.59        Medications Discontinued During This Encounter  Medication Reason   CALCIUM-VITAMIN D PO    sildenafil (REVATIO) 20 MG tablet    aspirin 81 MG chewable tablet Discontinued by provider    No orders of the defined types were placed in this encounter.  Orders Placed This Encounter  Procedures   EKG 12-Lead   INR was 2.4 and therapeutic today.  Recommendations:   Brian Cummings is a 66 y.o. Caucasian male patient with CAD SP CABG in 2017, hypertension, hyperlipidemia, chronic stage IIIa kidney disease, diet-controlled diabetes mellitus, history of recurrent DVT and pulmonary embolism and also paroxysmal atrial fibrillation, hence has been on chronic anticoagulation therapy, he has had extensive hematology evaluation including at [facility] clinic with no etiology found for his hypercoagulable state.  Hence was advised to continue anticoagulation indefinitely.  Admitted in May 2023 with syncope and collapse after severe lower GI bleed and hematuria and hypotension.  Symptoms are resolved, warfarin has been switched over to Eliquis.  From cardiac standpoint he remains stable without clinical evidence of heart failure.  Presently his blood pressure is well controlled.  Otherwise remained stable from cardiac standpoint, as he is presently on anticoagulation, with Eliquis and as he has had prior history of GI bleed, will discontinue aspirin as CAD has been stable on chronic.  Blood pressure is well controlled.  Reviewed, lipids under excellent control, triglycerides are elevated secondary to his poor dietary habits and also uncontrolled diabetes mellitus.  We discussed making changes to this.  Otherwise remained stable and I will see him back in 6 months.  Continue Repatha  for hypercholesterolemia.   Adrian Prows, MD, [facility] 07/03/2022, 5:16 PM Office: [phone]

## 2022-07-27 ENCOUNTER — Encounter (HOSPITAL_BASED_OUTPATIENT_CLINIC_OR_DEPARTMENT_OTHER): Payer: Self-pay | Admitting: UROLOGY

## 2022-08-22 ENCOUNTER — Encounter (HOSPITAL_BASED_OUTPATIENT_CLINIC_OR_DEPARTMENT_OTHER): Payer: Self-pay | Admitting: Family

## 2022-10-28 ENCOUNTER — Other Ambulatory Visit (HOSPITAL_BASED_OUTPATIENT_CLINIC_OR_DEPARTMENT_OTHER): Payer: Self-pay | Admitting: Cardiovascular Disease

## 2022-10-28 DIAGNOSIS — I2510 Atherosclerotic heart disease of native coronary artery without angina pectoris: Secondary | ICD-10-CM

## 2022-10-29 ENCOUNTER — Other Ambulatory Visit: Payer: Self-pay

## 2022-11-14 HISTORY — PX: ORIF HIP FRACTURE: SHX2125

## 2022-11-22 ENCOUNTER — Other Ambulatory Visit: Payer: Self-pay

## 2022-11-22 DIAGNOSIS — I25118 Atherosclerotic heart disease of native coronary artery with other forms of angina pectoris: Secondary | ICD-10-CM

## 2022-11-22 DIAGNOSIS — E7800 Pure hypercholesterolemia, unspecified: Secondary | ICD-10-CM

## 2023-01-02 ENCOUNTER — Encounter: Payer: Self-pay | Admitting: Cardiology

## 2023-01-02 ENCOUNTER — Ambulatory Visit: Payer: Medicare Other | Admitting: Cardiology

## 2023-01-02 VITALS — BP 135/89 | HR 66 | Ht 75.0 in | Wt 235.8 lb

## 2023-01-02 DIAGNOSIS — E7800 Pure hypercholesterolemia, unspecified: Secondary | ICD-10-CM

## 2023-01-02 DIAGNOSIS — N1831 Chronic kidney disease, stage 3a: Secondary | ICD-10-CM

## 2023-01-02 DIAGNOSIS — I25118 Atherosclerotic heart disease of native coronary artery with other forms of angina pectoris: Secondary | ICD-10-CM

## 2023-01-02 DIAGNOSIS — I77810 Thoracic aortic ectasia: Secondary | ICD-10-CM

## 2023-01-02 DIAGNOSIS — I480 Paroxysmal atrial fibrillation: Secondary | ICD-10-CM

## 2023-01-02 MED ORDER — METOPROLOL SUCCINATE ER 25 MG PO TB24: 25.0000 mg | ORAL_TABLET | Freq: Every day | ORAL | 2 refills | Status: DC

## 2023-01-02 MED ORDER — NITROGLYCERIN 0.4 MG SL SUBL: 0.4000 mg | SUBLINGUAL_TABLET | SUBLINGUAL | 2 refills | Status: AC | PRN

## 2023-01-02 NOTE — Progress Notes (Signed)
Primary Physician/Referring:  [name], MD  Patient ID: Brian Cummings, male    DOB: 05-Jun-1956, 67 y.o.   MRN: [id number]  Chief Complaint  Patient presents with   Coronary Artery Disease   Follow-up   Chest Pain    HPI:    Brian Cummings  is a 67 y.o. Caucasian male patient with CAD SP CABG in 2017, hypertension, hyperlipidemia, chronic stage IIIa kidney disease, diet-controlled diabetes mellitus, history of recurrent DVT and pulmonary embolism and also paroxysmal atrial fibrillation, hence has been on chronic anticoagulation therapy, he has had extensive hematology evaluation including at [facility] clinic with no etiology found for his hypercoagulable state.  Hence was advised to continue anticoagulation indefinitely.  Admitted in May 2023 with syncope and collapse after severe lower GI bleed and hematuria and hypotension.  Symptoms are resolved, warfarin has been switched over to Eliquis.  This is a 68-monthoffice visit, over the past couple months he has noticed chest tightness, in the middle of the chest lasting several minutes to several hours, sometimes with exertion sometimes doing routine activity.  He is also noticed frequent skipping in his heartbeat as per his wife was trying to his heart.  This is new to him.  Otherwise denies any specific complaints of dyspnea, PND or orthopnea.  He has not had any dark stool or bloody stool.  Past Medical History:  Diagnosis Date   AF (paroxysmal atrial fibrillation) (HCC)    Celiac disease    [place]    Recurrent DVT and PE   Coronary artery disease    Diabetes ([place]    Hypercholesteremia    Hypertension    IBS (irritable bowel syndrome)    Pulmonary embolism ([place]    Renal disorder    Past Surgical History:  Procedure Laterality Date   BYPASS GRAFT     CHOLECYSTECTOMY     COLONOSCOPY WITH PROPOFOL N/A 03/29/2022   Procedure: COLONOSCOPY WITH PROPOFOL;  Surgeon: CLavena Bullion DO;  [place]  Service: Gastroenterology;  Laterality: N/A;   CORONARY ARTERY BYPASS GRAFT  12/30/2015   LIMA to LAD, SVG Y-graft to RI-OM 2, SVG to R PDA   CORONARY ARTERY BYPASS GRAFT     ESOPHAGOGASTRODUODENOSCOPY (EGD) WITH PROPOFOL N/A 03/27/2022   Procedure: ESOPHAGOGASTRODUODENOSCOPY (EGD) WITH PROPOFOL;  Surgeon: DDoran Stabler MD;  [place]  Service: Gastroenterology;  Laterality: N/A;   MELANOMA EXCISION Right    RIGHT EAR   Family History  Problem Relation Age of Onset   Aneurysm Mother 875  Lung disease Father 745  Gout Brother     Social History   Tobacco Use   Smoking status: Never   Smokeless tobacco: Never  Substance Use Topics   Alcohol use: Not Currently    Comment: Rarely   Marital Status:    ROS  Review of Systems  Cardiovascular:  Positive for chest pain and irregular heartbeat. Negative for dyspnea on exertion and leg swelling.  Gastrointestinal:  Negative for melena.   Objective  Blood pressure 135/89, pulse 66, height 6' 3"$  (1.905 m), weight 235 lb 12.8 oz (107 kg), SpO2 99 %. Body mass index is 29.47 kg/m.     01/02/2023    1:39 PM 06/30/2022    1:10 PM 05/04/2022    1:22 PM  Vitals with BMI  Height 6' 3"$  6' 3"$  6' 1.75"  Weight 235 lbs 13 oz 233 lbs 236 lbs 8 oz  BMI 29.47 29.12  [id number]  Systolic [id number]  Diastolic 89 76 80  Pulse 66 77 52    Physical Exam Neck:     Vascular: No carotid bruit or JVD.  Cardiovascular:     Rate and Rhythm: Normal rate and regular rhythm.     Pulses: Intact distal pulses.     Heart sounds: Normal heart sounds. No murmur heard.    No gallop.  Pulmonary:     Effort: Pulmonary effort is normal.     Breath sounds: Normal breath sounds.  Abdominal:     General: Bowel sounds are normal.     Palpations: Abdomen is soft.  Musculoskeletal:     Right lower leg: No edema.     Left lower leg: No edema.     Laboratory examination:   External labs:   Labs 10/12/2022:  A1c 6.6%.  Total  cholesterol 63, triglycerides 150, HDL 36, LDL could not be calculated.  Sodium 140, potassium 4.2, BUN 27, creatinine 1.66, EGFR 42/48 mL.  Labs 06/22/2022:  Total cholesterol 74, triglycerides 226, HDL 34, LDL 40.  A1c 7.8%.  Hb 14.3/HCT 42.8, platelets 208.  LFTs normal.  BUN 24, creatinine 1.62, EGFR 43 mL, potassium 4.5.  Medications and allergies   Allergies  Allergen Reactions   Flomax [Tamsulosin]    Gluten Meal     Other reaction(s): celiac disease   Statins     Muscle aches   Statins     Other reaction(s): myalgia    Medication list after today's encounter    Current Outpatient Medications:    amLODipine (NORVASC) 5 MG tablet, Take 10 mg by mouth daily., Disp: , Rfl:    apixaban (ELIQUIS) 5 MG TABS tablet, Take 1 tablet (5 mg total) by mouth 2 (two) times daily., Disp: 60 tablet, Rfl: 0   calcium carbonate (SUPER CALCIUM) 1500 (600 Ca) MG TABS tablet, Take 1 tablet by mouth daily., Disp: , Rfl:    Cholecalciferol (VITAMIN D3 PO), Take 1 tablet by mouth at bedtime. Unsure of dose, Disp: , Rfl:    cholestyramine (QUESTRAN) 4 g packet, Take 4 g by mouth daily., Disp: , Rfl:    citalopram (CELEXA) 10 MG tablet, Take 10 mg by mouth daily., Disp: , Rfl:    Evolocumab with Infusor ([place]) 420 MG/3.5ML SOCT, Inject 1 Units into the skin every 28 (twenty-eight) days., Disp: 3.6 mL, Rfl: 12   famotidine (PEPCID) 40 MG tablet, Take 40 mg by mouth 2 (two) times daily., Disp: , Rfl:    fenofibrate (TRICOR) 145 MG tablet, Take 145 mg by mouth daily., Disp: , Rfl:    fluocinonide cream (LIDEX) [id number] %, Apply 1 application. topically daily as needed (dry patches on ear)., Disp: , Rfl:    hydrocortisone 2.5 % cream, Apply 1 application topically as needed., Disp: , Rfl:    KRILL OIL PO, Take 1 capsule by mouth daily., Disp: , Rfl:    losartan (COZAAR) 25 MG tablet, Take 25 mg by mouth daily., Disp: , Rfl:    LUTEIN PO, Take 1 capsule by mouth daily., Disp: , Rfl:     melatonin 1 MG TABS tablet, Take 1 mg by mouth at bedtime., Disp: , Rfl:    metFORMIN (GLUCOPHAGE) 500 MG tablet, Take 500 mg by mouth 2 (two) times daily with a meal. Twice a day, Disp: , Rfl:    metoprolol succinate (TOPROL-XL) 25 MG 24 hr tablet, Take 1 tablet (25 mg total) by mouth daily. Take with  or immediately following a meal., Disp: 30 tablet, Rfl: 2   MULTIPLE VITAMIN PO, Take 1 tablet by mouth daily., Disp: , Rfl:    Polyethyl Glycol-Propyl Glycol (SYSTANE OP), Place 1 drop into both eyes in the morning and at bedtime., Disp: , Rfl:    silodosin (RAPAFLO) 4 MG CAPS capsule, Take 4 mg by mouth at bedtime., Disp: , Rfl:    folic acid (FOLVITE) 1 MG tablet, Take 1 mg by mouth daily., Disp: , Rfl:    nitroGLYCERIN (NITROSTAT) 0.4 MG SL tablet, Place 1 tablet (0.4 mg total) under the tongue every 5 (five) minutes as needed for chest pain., Disp: 25 tablet, Rfl: 2  Radiology:   No results found.  Cardiac Studies:   Coronary angiogram 05/11/2018: LM: Large, mild disease. LAD proximal LAD 100% stenosed.  LIMA to LAD widely patent. RI: 100% stenosed. CX: OM 2 large 100% stenosed. SVG Y-graft to RI and OM 2 widely patent. Proximal RCA mild disease, ostial right PDA 100% stenosed.  SVG to PDA widely patent. LVEF 60%.  Holter monitor 09/18/2019: Minimum heart rate 50 bpm, average heart rate 67 bpm, rare PVCs and PACs. Brief atrial tachycardia, longest 5 beats.  Symptoms correlated with normal sinus rhythm.  Dobutamine stress echocardiogram 03/09/2021: EKG: Normal sinus rhythm.  Occasional PACs with dobutamine infusion. No dobutamine induced LV dilatation or wall motion abnormality.  Normal LV size and function.  Abdominal aortic duplex 2022: Proximal abdominal dimensions 2.1 x 2.5 cm, mid abdominal aorta measures 2.8 x 2.3 cm, distal aorta measures 2.4 x 2.7 cm.  Common iliac arteries measure approximately 1.4 cm.  No aneurysmal dilatation of the abdominal aorta is  seen.  Echocardiogram 03/27/2022:  1. Left ventricular ejection fraction, by estimation, is 55 to 60%. The left ventricle has normal function. The left ventricle has no regional wall motion abnormalities. Left ventricular diastolic parameters were normal.  2. Right ventricular systolic function is low normal. The right ventricular size is normal. There is normal pulmonary artery systolic pressure. The estimated right ventricular systolic pressure is 123456 mmHg.  3. Left atrial size was mildly dilated.  4. The mitral valve is normal in structure. Trivial mitral valve regurgitation. No evidence of mitral stenosis.  5. The aortic valve is tricuspid. Aortic valve regurgitation is not visualized. No aortic stenosis is present.  6. Aortic dilatation noted. There is mild dilatation of the aortic root, measuring 40 mm.  EKG:   EKG 01/02/2023: Normal sinus rhythm at the rate of 75 bpm, LAE, normal axis, incomplete right bundle branch block.  Frequent PVCs (3) and quadrigeminal pattern.  Compared to 06/30/2022, PVCs  Assessment     ICD-10-CM   1. Coronary artery disease of native artery of native heart with stable angina pectoris (HCC)  I25.118 EKG 12-Lead    PCV MYOCARDIAL PERFUSION WO LEXISCAN    nitroGLYCERIN (NITROSTAT) 0.4 MG SL tablet    metoprolol succinate (TOPROL-XL) 25 MG 24 hr tablet    PCV ECHOCARDIOGRAM COMPLETE    2. Paroxysmal atrial fibrillation (HCC)  I48.0     3. Pure hypercholesterolemia  E78.00     4. Aortic root dilatation (HCC)  I77.810 PCV ECHOCARDIOGRAM COMPLETE    5. Stage 3a chronic kidney disease (HCC)  N18.31        Medications Discontinued During This Encounter  Medication Reason   allopurinol (ZYLOPRIM) 100 MG tablet Discontinued by provider   Coenzyme Q10 (COQ-10) 400 MG CAPS Patient Preference   vitamin C (ASCORBIC ACID) 500 MG  tablet Patient Preference   ferrous sulfate 325 (65 FE) MG tablet Discontinued by provider   nitroGLYCERIN (NITROSTAT) 0.4 MG SL  tablet Reorder    Meds ordered this encounter  Medications   nitroGLYCERIN (NITROSTAT) 0.4 MG SL tablet    Sig: Place 1 tablet (0.4 mg total) under the tongue every 5 (five) minutes as needed for chest pain.    Dispense:  25 tablet    Refill:  2   metoprolol succinate (TOPROL-XL) 25 MG 24 hr tablet    Sig: Take 1 tablet (25 mg total) by mouth daily. Take with or immediately following a meal.    Dispense:  30 tablet    Refill:  2   Orders Placed This Encounter  Procedures   PCV MYOCARDIAL PERFUSION WO LEXISCAN    Standing Status:   Future    Standing Expiration Date:   03/03/2023   EKG 12-Lead   PCV ECHOCARDIOGRAM COMPLETE    Standing Status:   Future    Standing Expiration Date:   01/03/2024    Recommendations:   Brian Cummings is a 67 y.o. Caucasian male patient with CAD SP CABG in 2017, hypertension, hyperlipidemia, chronic stage IIIa kidney disease, diet-controlled diabetes mellitus, history of recurrent DVT and pulmonary embolism and also paroxysmal atrial fibrillation, hence has been on chronic anticoagulation therapy, he has had extensive hematology evaluation including at [facility] with no etiology found for his hypercoagulable state.  Hence was advised to continue anticoagulation indefinitely. This is a 6 month OV.  1. Coronary artery disease of native artery of native heart with stable angina pectoris [facility]) Patient has now developed recurrence of angina pectoris but he has not used any sublingual nitroglycerin.  Advised him to have a low threshold to sublingual nitroglycerin, he also has new frequent PVCs on the EKG.  In view of his symptomatology, I would like to repeat nuclear stress test.  I have added metoprolol succinate 25 mg daily both for angina and PVCs and CAD.  2. Paroxysmal atrial fibrillation (HCC) He has not had any documented atrial fibrillation further, however due to hypercoagulable state he is on chronic anticoagulation.  He has history of lower GI bleed  due to diverticulosis in the past.  Fortunately tolerating this without bleeding diathesis.  External labs reviewed.  Hemoglobin has stabilized.  3. Primary hypertension Is slightly elevated today, hopefully addition of metoprolol will help in bringing blood pressure to <130/80 mmHg.  Continue amlodipine 10 mg daily along with losartan.  4. Pure hypercholesterolemia Lipids are well-controlled.  Presently on Repatha and also fenofibrate.  We could consider discontinuation of fenofibrate if he were to lose some weight, recent labs reveal triglycerides to be 150 mg hence continue present medications.  5. Aortic root dilatation (HCC) Will also repeat echocardiogram to follow-up on aortic root dilatation and also coronary artery disease and angina pectoris.  6. Stage 3a chronic kidney disease ([place]) I reviewed his labs, stage IIIa chronic kidney disease has remained stable.  I reassured him.  I would like to see him back in 6 weeks for follow-up.    Brian Prows, MD, [facility] 01/02/2023, [place] PM Office: [phone]

## 2023-01-03 ENCOUNTER — Other Ambulatory Visit: Payer: Self-pay | Admitting: Cardiology

## 2023-01-03 DIAGNOSIS — E7800 Pure hypercholesterolemia, unspecified: Secondary | ICD-10-CM

## 2023-01-03 DIAGNOSIS — I25118 Atherosclerotic heart disease of native coronary artery with other forms of angina pectoris: Secondary | ICD-10-CM

## 2023-01-12 ENCOUNTER — Ambulatory Visit: Payer: Medicare Other

## 2023-01-12 DIAGNOSIS — I25118 Atherosclerotic heart disease of native coronary artery with other forms of angina pectoris: Secondary | ICD-10-CM

## 2023-01-12 DIAGNOSIS — I77810 Thoracic aortic ectasia: Secondary | ICD-10-CM

## 2023-01-16 NOTE — Progress Notes (Signed)
Echocardiogram 01/12/2023: Left ventricle cavity is mildly dilated. Normal left ventricular wall thickness. Normal global wall motion. Normal LV systolic function with EF 52%. Normal diastolic filling pattern. Left atrial cavity is borderline dilated. Mild (Grade I) mitral regurgitation. Mild tricuspid regurgitation.  Mild pulmonic regurgitation. No evidence of pulmonary hypertension. Compared to previous study on 03/27/2022, mild LV dilatation, mild PI are new. MR, TR previously reported trace. Aortic root dilatation is not appreciated.

## 2023-01-17 ENCOUNTER — Ambulatory Visit: Payer: Medicare Other

## 2023-01-17 DIAGNOSIS — I25118 Atherosclerotic heart disease of native coronary artery with other forms of angina pectoris: Secondary | ICD-10-CM

## 2023-02-13 ENCOUNTER — Encounter: Payer: Self-pay | Admitting: Cardiology

## 2023-02-13 ENCOUNTER — Ambulatory Visit: Payer: Medicare Other | Admitting: Cardiology

## 2023-02-13 VITALS — BP 123/82 | HR 70 | Ht 75.0 in | Wt 225.0 lb

## 2023-02-13 DIAGNOSIS — I255 Ischemic cardiomyopathy: Secondary | ICD-10-CM

## 2023-02-13 DIAGNOSIS — R0683 Snoring: Secondary | ICD-10-CM

## 2023-02-13 DIAGNOSIS — I10 Essential (primary) hypertension: Secondary | ICD-10-CM

## 2023-02-13 DIAGNOSIS — R5381 Other malaise: Secondary | ICD-10-CM

## 2023-02-13 DIAGNOSIS — I25118 Atherosclerotic heart disease of native coronary artery with other forms of angina pectoris: Secondary | ICD-10-CM

## 2023-02-13 DIAGNOSIS — I77810 Thoracic aortic ectasia: Secondary | ICD-10-CM

## 2023-02-13 MED ORDER — METOPROLOL SUCCINATE ER 25 MG PO TB24: 25.0000 mg | ORAL_TABLET | Freq: Every day | ORAL | 3 refills | Status: AC

## 2023-02-13 NOTE — Progress Notes (Unsigned)
Primary Physician/Referring:  [name], MD  Patient ID: Brian Cummings, male    DOB: May 11, 1956, 67 y.o.   MRN: [id number]  Chief Complaint  Patient presents with   Coronary artery disease of native artery of native heart wi   Follow-up    HPI:    Brian Cummings  is a 67 y.o. Caucasian male patient with CAD SP CABG in 2017, hypertension, hyperlipidemia, chronic stage IIIa kidney disease, diet-controlled diabetes mellitus, history of recurrent DVT and pulmonary embolism and also paroxysmal atrial fibrillation, hence has been on chronic anticoagulation therapy, he has had extensive hematology evaluation including at [facility] clinic with no etiology found for his hypercoagulable state.  Hence was advised to continue anticoagulation indefinitely.  Admitted in May 2023 with syncope and collapse after severe lower GI bleed and hematuria and hypotension.  Symptoms are resolved, warfarin has been switched over to Eliquis.  This is a 74-month office visit, over the past couple months he has noticed chest tightness, in the middle of the chest lasting several minutes to several hours, sometimes with exertion sometimes doing routine activity.  He is also noticed frequent skipping in his heartbeat as per his wife was trying to his heart.  This is new to him.  Otherwise denies any specific complaints of dyspnea, PND or orthopnea.  He has not had any dark stool or bloody stool.  Past Medical History:  Diagnosis Date   AF (paroxysmal atrial fibrillation)    Celiac disease    Clotting disorder    Recurrent DVT and PE   Coronary artery disease    Diabetes    Hypercholesteremia    Hypertension    IBS (irritable bowel syndrome)    Pulmonary embolism    Renal disorder    Past Surgical History:  Procedure Laterality Date   BYPASS GRAFT     CHOLECYSTECTOMY     COLONOSCOPY WITH PROPOFOL N/A 03/29/2022   Procedure: COLONOSCOPY WITH PROPOFOL;  Surgeon: Lavena Bullion, DO;  Location: [place];  Service: Gastroenterology;  Laterality: N/A;   CORONARY ARTERY BYPASS GRAFT  12/30/2015   LIMA to LAD, SVG Y-graft to RI-OM 2, SVG to R PDA   CORONARY ARTERY BYPASS GRAFT     ESOPHAGOGASTRODUODENOSCOPY (EGD) WITH PROPOFOL N/A 03/27/2022   Procedure: ESOPHAGOGASTRODUODENOSCOPY (EGD) WITH PROPOFOL;  Surgeon: Doran Stabler, MD;  Location: [place];  Service: Gastroenterology;  Laterality: N/A;   MELANOMA EXCISION Right    RIGHT EAR   Family History  Problem Relation Age of Onset   Aneurysm Mother 47   Lung disease Father 11   Gout Brother     Social History   Tobacco Use   Smoking status: Never   Smokeless tobacco: Never  Substance Use Topics   Alcohol use: Not Currently    Comment: Rarely   Marital Status:    ROS  Review of Systems  Cardiovascular:  Positive for chest pain and irregular heartbeat. Negative for dyspnea on exertion and leg swelling.  Gastrointestinal:  Negative for melena.   Objective  Blood pressure 123/82, pulse 70, height 6\' 3"  (1.905 m), weight 225 lb (102.1 kg), SpO2 97 %. Body mass index is 28.12 kg/m.     02/13/2023    2:11 PM 01/02/2023    1:39 PM 06/30/2022    1:10 PM  Vitals with BMI  Height 6\' 3"  6\' 3"  6\' 3"   Weight 225 lbs 235 lbs 13 oz 233 lbs  BMI 28.12 [id number]  Systolic [id number]  [id number]  Diastolic 82 89 76  Pulse 70 66 77    Physical Exam Neck:     Vascular: No carotid bruit or JVD.  Cardiovascular:     Rate and Rhythm: Normal rate and regular rhythm.     Pulses: Intact distal pulses.     Heart sounds: Normal heart sounds. No murmur heard.    No gallop.  Pulmonary:     Effort: Pulmonary effort is normal.     Breath sounds: Normal breath sounds.  Abdominal:     General: Bowel sounds are normal.     Palpations: Abdomen is soft.  Musculoskeletal:     Right lower leg: No edema.     Left lower leg: No edema.     Laboratory examination:   External labs:   Labs 10/12/2022:  A1c 6.6%.  Total cholesterol 63,  triglycerides 150, HDL 36, LDL could not be calculated.  Sodium 140, potassium 4.2, BUN 27, creatinine 1.66, EGFR 42/48 mL.  Labs 06/22/2022:  Total cholesterol 74, triglycerides 226, HDL 34, LDL 40.  A1c 7.8%.  Hb 14.3/HCT 42.8, platelets 208.  LFTs normal.  BUN 24, creatinine 1.62, EGFR 43 mL, potassium 4.5.  Medications and allergies   Allergies  Allergen Reactions   Flomax [Tamsulosin]    Gluten Meal     Other reaction(s): celiac disease   Statins     Muscle aches   Statins     Other reaction(s): myalgia    Medication list after today's encounter    Current Outpatient Medications:    amLODipine (NORVASC) 10 MG tablet, Take 10 mg by mouth daily., Disp: , Rfl:    apixaban (ELIQUIS) 5 MG TABS tablet, Take 1 tablet (5 mg total) by mouth 2 (two) times daily., Disp: 60 tablet, Rfl: 0   calcium carbonate (SUPER CALCIUM) 1500 (600 Ca) MG TABS tablet, Take 1 tablet by mouth daily., Disp: , Rfl:    Cholecalciferol (VITAMIN D3 PO), Take 1 tablet by mouth at bedtime. Unsure of dose, Disp: , Rfl:    cholestyramine (QUESTRAN) 4 g packet, Take 4 g by mouth daily., Disp: , Rfl:    citalopram (CELEXA) 10 MG tablet, Take 10 mg by mouth daily., Disp: , Rfl:    famotidine (PEPCID) 40 MG tablet, Take 40 mg by mouth 2 (two) times daily., Disp: , Rfl:    fenofibrate (TRICOR) 145 MG tablet, Take 145 mg by mouth daily., Disp: , Rfl:    fluocinonide cream (LIDEX) [id number] %, Apply 1 application. topically daily as needed (dry patches on ear)., Disp: , Rfl:    folic acid (FOLVITE) 1 MG tablet, Take 1 mg by mouth daily., Disp: , Rfl:    hydrocortisone 2.5 % cream, Apply 1 application topically as needed., Disp: , Rfl:    KRILL OIL PO, Take 1 capsule by mouth daily., Disp: , Rfl:    losartan (COZAAR) 25 MG tablet, Take 25 mg by mouth daily., Disp: , Rfl:    LUTEIN PO, Take 1 capsule by mouth daily., Disp: , Rfl:    melatonin 1 MG TABS tablet, Take 1 mg by mouth at bedtime., Disp: , Rfl:    metFORMIN  (GLUCOPHAGE) 500 MG tablet, Take 500 mg by mouth 2 (two) times daily with a meal. Twice a day, Disp: , Rfl:    MULTIPLE VITAMIN PO, Take 1 tablet by mouth daily., Disp: , Rfl:    nitroGLYCERIN (NITROSTAT) 0.4 MG SL tablet, Place 1 tablet (0.4 mg total) under the tongue every 5 (  five) minutes as needed for chest pain., Disp: 25 tablet, Rfl: 2   Polyethyl Glycol-Propyl Glycol (SYSTANE OP), Place 1 drop into both eyes in the morning and at bedtime., Disp: , Rfl:    [place] 420 MG/3.5ML SOCT, INJECT 1 UNITS INTO THE SKIN EVERY 28 (TWENTY-EIGHT) DAYS., Disp: 3.5 mL, Rfl: 12   silodosin (RAPAFLO) 4 MG CAPS capsule, Take 4 mg by mouth at bedtime., Disp: , Rfl:    metoprolol succinate (TOPROL-XL) 25 MG 24 hr tablet, Take 1 tablet (25 mg total) by mouth daily. Take with or immediately following a meal., Disp: 90 tablet, Rfl: 3  Radiology:   No results found.  Cardiac Studies:   Coronary angiogram 05/11/2018: LM: Large, mild disease. LAD proximal LAD 100% stenosed.  LIMA to LAD widely patent. RI: 100% stenosed. CX: OM 2 large 100% stenosed. SVG Y-graft to RI and OM 2 widely patent. Proximal RCA mild disease, ostial right PDA 100% stenosed.  SVG to PDA widely patent. LVEF 60%.  Holter monitor 09/18/2019: Minimum heart rate 50 bpm, average heart rate 67 bpm, rare PVCs and PACs. Brief atrial tachycardia, longest 5 beats.  Symptoms correlated with normal sinus rhythm.  Dobutamine stress echocardiogram 03/09/2021: EKG: Normal sinus rhythm.  Occasional PACs with dobutamine infusion. No dobutamine induced LV dilatation or wall motion abnormality.  Normal LV size and function.  Abdominal aortic duplex 2022: Proximal abdominal dimensions 2.1 x 2.5 cm, mid abdominal aorta measures 2.8 x 2.3 cm, distal aorta measures 2.4 x 2.7 cm.  Common iliac arteries measure approximately 1.4 cm.  No aneurysmal dilatation of the abdominal aorta is seen.  PCV ECHOCARDIOGRAM COMPLETE  01/12/2023  Narrative Echocardiogram 01/12/2023: Left ventricle cavity is mildly dilated. Normal left ventricular wall thickness. Normal global wall motion. Normal LV systolic function with EF 52%. Normal diastolic filling pattern. Left atrial cavity is borderline dilated. Mild (Grade I) mitral regurgitation. Mild tricuspid regurgitation. Mild pulmonic regurgitation. No evidence of pulmonary hypertension. Compared to 03/27/2022, LVEF is estimated at 55 to 60%. Mild LV dilatation, mild PI are new. MR, TR previously reported trace. Aortic root dilatation is not appreciated.  PCV MYOCARDIAL PERFUSION WO LEXISCAN 01/17/2023  Narrative Exercise Tetrofosmin stress test 01/17/2023: Exercise nuclear stress test was performed using Bruce protocol. Patient reached 7.6 METS, and 86% of age predicted maximum heart rate. Exercise capacity was good. No chest pain reported. Heart rate and hemodynamic response were normal. Stress EKG revealed no ischemic changes. Rest EKG showed frequent PVCs, recovery EKG showed occasional PVCs, with no significant PVCs seen during stress. SPECT images show very small sized, mild intensity, reversible perfusion defect in apical septal myocardium. Stress LVEF 51%. Low risk study.    EKG:   EKG 01/02/2023: Normal sinus rhythm at the rate of 75 bpm, LAE, normal axis, incomplete right bundle branch block.  Frequent PVCs (3) and quadrigeminal pattern.  Compared to 06/30/2022, PVCs  Assessment     ICD-10-CM   1. Coronary artery disease of native artery of native heart with stable angina pectoris  I25.118 EKG 12-Lead    metoprolol succinate (TOPROL-XL) 25 MG 24 hr tablet    PCV ECHOCARDIOGRAM COMPLETE    2. Primary hypertension  I10     3. Aortic root dilatation  I77.810 PCV ECHOCARDIOGRAM COMPLETE    4. Ischemic cardiomyopathy  I25.5 PCV ECHOCARDIOGRAM COMPLETE    5. Snoring  R06.83 Ambulatory referral to Sleep Studies    6. Malaise and fatigue  R53.81 Ambulatory  referral to Sleep Studies  R53.83        Medications Discontinued During This Encounter  Medication Reason   amLODipine (NORVASC) 5 MG tablet Patient Preference   metoprolol succinate (TOPROL-XL) 25 MG 24 hr tablet Reorder    Meds ordered this encounter  Medications   metoprolol succinate (TOPROL-XL) 25 MG 24 hr tablet    Sig: Take 1 tablet (25 mg total) by mouth daily. Take with or immediately following a meal.    Dispense:  90 tablet    Refill:  3   Orders Placed This Encounter  Procedures   Ambulatory referral to Sleep Studies    Referral Priority:   Routine    Referral Type:   Consultation    Referral Reason:   Specialty Services Required    Number of Visits Requested:   1   EKG 12-Lead   PCV ECHOCARDIOGRAM COMPLETE    Standing Status:   Future    Standing Expiration Date:   02/13/2024    Recommendations:   Brian Cummings is a 67 y.o. Caucasian male patient with CAD SP CABG in 2017, hypertension, hyperlipidemia, chronic stage IIIa kidney disease, diet-controlled diabetes mellitus, history of recurrent DVT and pulmonary embolism and also paroxysmal atrial fibrillation, hence has been on chronic anticoagulation therapy, he has had extensive hematology evaluation including at [facility] clinic with no etiology found for his hypercoagulable state.  Hence was advised to continue anticoagulation indefinitely.   I had seen him 6 weeks ago for worsening symptoms of angina and also fatigue and dyspnea.  Set him up for echocardiogram and stress test and presents for follow-up.  1. Coronary artery disease of native artery of native heart with stable angina pectoris Patient has mildly abnormal nuclear stress test revealing anteroapical ischemia, probably suspect distal LAD disease.  His symptoms of marked fatigue and dyspnea cannot be explained by this mild abnormality.  Also since I started him on metoprolol succinate 25 mg daily, he has not had any further episodes of angina hence would  recommend continued medical therapy for now.   - EKG 12-Lead - metoprolol succinate (TOPROL-XL) 25 MG 24 hr tablet; Take 1 tablet (25 mg total) by mouth daily. Take with or immediately following a meal.  Dispense: 90 tablet; Refill: 3 - PCV ECHOCARDIOGRAM COMPLETE; Future  2. Primary hypertension Blood pressure under excellent control.  He is on appropriate medical therapy.  3. Aortic root dilatation I will be repeating his echocardiogram in 6 months and will try to look at his aortic root again. - PCV ECHOCARDIOGRAM COMPLETE; Future  4. Ischemic cardiomyopathy Patient's LVEF has marginally decreased to around 50%, he had frequent PVCs on his last office visit, these are resolved since starting beta-blocker therapy.  But however to keep an eye on his LVEF, LV dilatation, I will repeat echocardiogram in 6 months and see him back at that time.  - PCV ECHOCARDIOGRAM COMPLETE; Future  5. Snoring Patient has loud snoring, chronic fatigue and malaise, contributed by frequency of urination and also frequent bowel movement as he has IBS.  Metformin has made his bowel movements worse.  I would like to exclude significant sleep apnea, referral made for sleep study and sleep evaluation. - Ambulatory referral to Sleep Studies  6. Malaise and fatigue He will discuss with his PCP regarding management of BPH symptoms, could consider tadalafil on a daily basis to see whether his symptoms would improve. - Ambulatory referral to Sleep Studies   Adrian Prows, MD, [facility] 02/13/2023, 2:43 PM Office: [phone]

## 2023-02-13 NOTE — Progress Notes (Signed)
 Primary Physician/Referring:  Verdia Lombard, MD    Patient ID: Dustin Webster, male    DOB: 07-Sep-1956, 67 y.o.   MRN: [id number]    Chief Complaint   Patient presents with   . Coronary artery disease of native artery of native heart wi   . Follow-up       HPI:      Dustin Webster  is a 67 y.o. Caucasian male patient with CAD SP CABG in 2017, hypertension, hyperlipidemia, chronic stage IIIa kidney disease, diet-controlled diabetes mellitus, history of recurrent DVT and pulmonary embolism and also paroxysmal atrial fibrillation, hence has been on chronic anticoagulation therapy, he has had extensive hematology evaluation including at [facility] clinic with no etiology found for his hypercoagulable state.  Hence was advised to continue anticoagulation indefinitely.    Admitted in May 2023 with syncope and collapse after severe lower GI bleed and hematuria and hypotension.  Symptoms are resolved, warfarin has been switched over to Eliquis .      He was seen by me about 4 to 6 weeks ago for chest tightness, in the middle of the chest lasting several minutes to several hours, sometimes with exertion sometimes doing routine activity.  He is also noticed frequent skipping in his heartbeat, fatigue and also mild exertional dyspnea.  He underwent echocardiogram and stress test and presents for follow-up.  He is today accompanied by his wife.       Since starting metoprolol , patient states that he has not had any further "skipping" of his heartbeat and has not had any further episodes of chest pain.  But still continues to have marked fatigue, daytime somnolence, mild exertional dyspnea.  He has not used any sublingual nitroglycerin .    Past Medical History:   Diagnosis Date   . AF (paroxysmal atrial fibrillation)    . Celiac disease    . Clotting disorder     Recurrent DVT and PE   . Coronary artery disease    . Diabetes    . Hypercholesteremia    . Hypertension    . IBS (irritable bowel syndrome)    . Pulmonary embolism     . Renal disorder      Past Surgical History:   Procedure Laterality Date   . BYPASS GRAFT     . CHOLECYSTECTOMY     . COLONOSCOPY WITH PROPOFOL  N/A 03/29/2022    Procedure: COLONOSCOPY WITH PROPOFOL ;  Surgeon: San Sandor GAILS, DO;  Location: [facility] ENDOSCOPY;  Service: Gastroenterology;  Laterality: N/A;   . CORONARY ARTERY BYPASS GRAFT  12/30/2015    LIMA to LAD, SVG Y-graft to RI-OM 2, SVG to R PDA   . CORONARY ARTERY BYPASS GRAFT     . ESOPHAGOGASTRODUODENOSCOPY (EGD) WITH PROPOFOL  N/A 03/27/2022    Procedure: ESOPHAGOGASTRODUODENOSCOPY (EGD) WITH PROPOFOL ;  Surgeon: Legrand Victory LITTIE DOUGLAS, MD;  Location: [facility] ENDOSCOPY;  Service: Gastroenterology;  Laterality: N/A;   . MELANOMA EXCISION Right     RIGHT EAR     Family History   Problem Relation Age of Onset   . Aneurysm Mother 57   . Lung disease Father 13   . Gout Brother       Social History     Tobacco Use   . Smoking status: Never   . Smokeless tobacco: Never   Substance Use Topics   . Alcohol use: Not Currently     Comment: Rarely     Marital Status:     ROS   Review of Systems  Constitutional: Positive for malaise/fatigue.   Cardiovascular:  Negative for chest pain, dyspnea on exertion, irregular heartbeat and leg swelling.   Respiratory:  Positive for snoring.      Objective   Blood pressure 123/82, pulse 70, height 6\' 3"  (1.905 m), weight 225 lb (102.1 kg), SpO2 97 %. Body mass index is 28.12 kg/m.       02/13/2023     2:11 PM 01/02/2023     1:39 PM 06/30/2022     1:10 PM   Vitals with BMI   Height 6\' 3"  6\' 3"  6\' 3"    Weight 225 lbs 235 lbs 13 oz 233 lbs   BMI 28.12 29.47 29.12   Systolic 123 135 882   Diastolic 82 89 76   Pulse 70 66 77      Physical Exam  Neck:      Vascular: No carotid bruit or JVD.   Cardiovascular:      Rate and Rhythm: Normal rate and regular rhythm.      Pulses: Intact distal pulses.      Heart sounds: Normal heart sounds. No murmur heard.     No gallop.   Pulmonary:      Effort: Pulmonary effort is normal.      Breath sounds: Normal breath  sounds.   Abdominal:      General: Bowel sounds are normal.      Palpations: Abdomen is soft.   Musculoskeletal:      Right lower leg: No edema.      Left lower leg: No edema.        Laboratory examination:     External labs:     Labs 10/12/2022:    A1c 6.6%.    Total cholesterol 63, triglycerides 150, HDL 36, LDL could not be calculated.    Sodium 140, potassium 4.2, BUN 27, creatinine 1.66, EGFR 42/48 mL.    Labs 06/22/2022:    Total cholesterol 74, triglycerides 226, HDL 34, LDL 40.    A1c 7.8%.    Hb 14.3/HCT 42.8, platelets 208.  LFTs normal.    BUN 24, creatinine 1.62, EGFR 43 mL, potassium 4.5.    Medications and allergies     Allergies   Allergen Reactions   . Flomax  [Tamsulosin ]    . Gluten Meal      Other reaction(s): celiac disease   . Statins      Muscle aches   . Statins      Other reaction(s): myalgia      Medication list after today's encounter       Current Outpatient Medications:   .  amLODipine  (NORVASC ) 10 MG tablet, Take 10 mg by mouth daily., Disp: , Rfl:   .  apixaban  (ELIQUIS ) 5 MG TABS tablet, Take 1 tablet (5 mg total) by mouth 2 (two) times daily., Disp: 60 tablet, Rfl: 0  .  calcium  carbonate (SUPER CALCIUM ) 1500 (600 Ca) MG TABS tablet, Take 1 tablet by mouth daily., Disp: , Rfl:   .  Cholecalciferol (VITAMIN D3 PO), Take 1 tablet by mouth at bedtime. Unsure of dose, Disp: , Rfl:   .  cholestyramine  (QUESTRAN ) 4 g packet, Take 4 g by mouth daily., Disp: , Rfl:   .  citalopram  (CELEXA ) 10 MG tablet, Take 10 mg by mouth daily., Disp: , Rfl:   .  famotidine  (PEPCID ) 40 MG tablet, Take 40 mg by mouth 2 (two) times daily., Disp: , Rfl:   .  fenofibrate  (TRICOR ) 145 MG tablet, Take  145 mg by mouth daily., Disp: , Rfl:   .  fluocinonide cream (LIDEX) 0.05 %, Apply 1 application. topically daily as needed (dry patches on ear)., Disp: , Rfl:   .  folic acid  (FOLVITE ) 1 MG tablet, Take 1 mg by mouth daily., Disp: , Rfl:   .  hydrocortisone  2.5 % cream, Apply 1 application topically as needed., Disp:  , Rfl:   .  KRILL OIL PO, Take 1 capsule by mouth daily., Disp: , Rfl:   .  losartan  (COZAAR ) 25 MG tablet, Take 25 mg by mouth daily., Disp: , Rfl:   .  LUTEIN PO, Take 1 capsule by mouth daily., Disp: , Rfl:   .  melatonin 1 MG TABS tablet, Take 1 mg by mouth at bedtime., Disp: , Rfl:   .  metFORMIN  (GLUCOPHAGE ) 500 MG tablet, Take 500 mg by mouth 2 (two) times daily with a meal. Twice a day, Disp: , Rfl:   .  MULTIPLE VITAMIN PO, Take 1 tablet by mouth daily., Disp: , Rfl:   .  nitroGLYCERIN  (NITROSTAT ) 0.4 MG SL tablet, Place 1 tablet (0.4 mg total) under the tongue every 5 (five) minutes as needed for chest pain., Disp: 25 tablet, Rfl: 2  .  Polyethyl Glycol-Propyl Glycol (SYSTANE OP), Place 1 drop into both eyes in the morning and at bedtime., Disp: , Rfl:   .  REPATHA  PUSHTRONEX SYSTEM 420 MG/3.5ML SOCT, INJECT 1 UNITS INTO THE SKIN EVERY 28 (TWENTY-EIGHT) DAYS., Disp: 3.5 mL, Rfl: 12  .  silodosin  (RAPAFLO ) 4 MG CAPS capsule, Take 4 mg by mouth at bedtime., Disp: , Rfl:   .  metoprolol  succinate (TOPROL -XL) 25 MG 24 hr tablet, Take 1 tablet (25 mg total) by mouth daily. Take with or immediately following a meal., Disp: 90 tablet, Rfl: 3    Radiology:     No results found.    Cardiac Studies:     Coronary angiogram 05/11/2018:  LM: Large, mild disease.  LAD proximal LAD 100% stenosed.  LIMA to LAD widely patent.  RI: 100% stenosed.  CX: OM 2 large 100% stenosed.  SVG Y-graft to RI and OM 2 widely patent.  Proximal RCA mild disease, ostial right PDA 100% stenosed.  SVG to PDA widely patent.  LVEF 60%.    Holter monitor 09/18/2019:  Minimum heart rate 50 bpm, average heart rate 67 bpm, rare PVCs and PACs.  Brief atrial tachycardia, longest 5 beats.  Symptoms correlated with normal sinus rhythm.    Dobutamine  stress echocardiogram 03/09/2021:  EKG: Normal sinus rhythm.  Occasional PACs with dobutamine  infusion.  No dobutamine  induced LV dilatation or wall motion abnormality.  Normal LV size and  function.    Abdominal aortic duplex 2022:  Proximal abdominal dimensions 2.1 x 2.5 cm, mid abdominal aorta measures 2.8 x 2.3 cm, distal aorta measures 2.4 x 2.7 cm.  Common iliac arteries measure approximately 1.4 cm.  No aneurysmal dilatation of the abdominal aorta is seen.    PCV ECHOCARDIOGRAM COMPLETE 01/12/2023    Narrative  Echocardiogram 01/12/2023:  Left ventricle cavity is mildly dilated. Normal left ventricular wall thickness. Normal global wall motion. Normal LV systolic function with EF 52%. Normal diastolic filling pattern.  Left atrial cavity is borderline dilated.  Mild (Grade I) mitral regurgitation.  Mild tricuspid regurgitation.  Mild pulmonic regurgitation.  No evidence of pulmonary hypertension.  Compared to 03/27/2022, LVEF is estimated at 55 to 60%. Mild LV dilatation, mild PI are new. MR, TR previously reported  trace. Aortic root dilatation is not appreciated.    PCV MYOCARDIAL PERFUSION WO LEXISCAN 01/17/2023    Narrative  Exercise Tetrofosmin stress test 01/17/2023:  Exercise nuclear stress test was performed using Bruce protocol. Patient reached 7.6 METS, and 86% of age predicted maximum heart rate. Exercise capacity was good. No chest pain reported. Heart rate and hemodynamic response were normal. Stress EKG revealed no ischemic changes. Rest EKG showed frequent PVCs, recovery EKG showed occasional PVCs, with no significant PVCs seen during stress.  SPECT images show very small sized, mild intensity, reversible perfusion defect in apical septal myocardium. Stress LVEF 51%.  Low risk study.       EKG:     EKG 02/13/2023: Normal sinus rhythm at rate of 68 bpm, normal axis, nonspecific T abnormality.  Compared to 01/02/2023, frequent PVCs (3) no longer present.    Assessment       ICD-10-CM    1. Coronary artery disease of native artery of native heart with stable angina pectoris  I25.118 EKG 12-Lead     metoprolol  succinate (TOPROL -XL) 25 MG 24 hr tablet     PCV ECHOCARDIOGRAM COMPLETE      2.  Primary hypertension  I10       3. Aortic root dilatation  I77.810 PCV ECHOCARDIOGRAM COMPLETE      4. Ischemic cardiomyopathy  I25.5 PCV ECHOCARDIOGRAM COMPLETE      5. Snoring  R06.83 Ambulatory referral to Sleep Studies      6. Malaise and fatigue  R53.81 Ambulatory referral to Sleep Studies    R53.83            Medications Discontinued During This Encounter   Medication Reason   . amLODipine  (NORVASC ) 5 MG tablet Patient Preference   . metoprolol  succinate (TOPROL -XL) 25 MG 24 hr tablet Reorder      Meds ordered this encounter   Medications   . metoprolol  succinate (TOPROL -XL) 25 MG 24 hr tablet     Sig: Take 1 tablet (25 mg total) by mouth daily. Take with or immediately following a meal.     Dispense:  90 tablet     Refill:  3     Orders Placed This Encounter   Procedures   . Ambulatory referral to Sleep Studies     Referral Priority:   Routine     Referral Type:   Consultation     Referral Reason:   Specialty Services Required     Number of Visits Requested:   1   . EKG 12-Lead   . PCV ECHOCARDIOGRAM COMPLETE     Standing Status:   Future     Standing Expiration Date:   02/13/2024       Recommendations:     Elias Dennington is a 66 y.o. Caucasian male patient with CAD SP CABG in 2017, hypertension, hyperlipidemia, chronic stage IIIa kidney disease, diet-controlled diabetes mellitus, history of recurrent DVT and pulmonary embolism and also paroxysmal atrial fibrillation, hence has been on chronic anticoagulation therapy, he has had extensive hematology evaluation including at [facility] clinic with no etiology found for his hypercoagulable state.  Hence was advised to continue anticoagulation indefinitely.     I had seen him 6 weeks ago for worsening symptoms of angina and also fatigue and dyspnea.  Set him up for echocardiogram and stress test and presents for follow-up.    1. Coronary artery disease of native artery of native heart with stable angina pectoris  Patient has mildly abnormal nuclear stress test  revealing anteroapical ischemia, probably suspect distal LAD disease.  His symptoms of marked fatigue and dyspnea cannot be explained by this mild abnormality.  Also since I started him on metoprolol  succinate 25 mg daily, he has not had any further episodes of angina hence would recommend continued medical therapy for now.  Will repeat echocardiogram in 6 months to follow-up on his LVEF as EF had reduced from 55-65% to low normal 50%, could be contributed by frequent PVCs.    - EKG 12-Lead  - metoprolol  succinate (TOPROL -XL) 25 MG 24 hr tablet; Take 1 tablet (25 mg total) by mouth daily. Take with or immediately following a meal.  Dispense: 90 tablet; Refill: 3  - PCV ECHOCARDIOGRAM COMPLETE; Future    2. Primary hypertension  Blood pressure under excellent control.  He is on appropriate medical therapy.    3. Aortic root dilatation  I will be repeating his echocardiogram in 6 months and will try to look at his aortic root again.  - PCV ECHOCARDIOGRAM COMPLETE; Future    4. Ischemic cardiomyopathy  Patient's LVEF has marginally decreased to around 50%, he had frequent PVCs on his last office visit, these are resolved since starting beta-blocker therapy.  But however to keep an eye on his LVEF, LV dilatation, I will repeat echocardiogram in 6 months and see him back at that time.    - PCV ECHOCARDIOGRAM COMPLETE; Future    5. Snoring  Patient has loud snoring, chronic fatigue and malaise, contributed by frequency of urination and also frequent bowel movement as he has IBS.  Metformin  has made his bowel movements worse.    I would like to exclude significant sleep apnea, referral made for sleep study and sleep evaluation.  - Ambulatory referral to Sleep Studies    6. Malaise and fatigue  He will discuss with his PCP regarding management of BPH symptoms, could consider tadalafil on a daily basis to see whether his symptoms would improve.  - Ambulatory referral to Sleep Studies      Gordy Bergamo, MD, [facility]  02/13/2023, 2:43  PM  Office: [phone]

## 2023-05-03 ENCOUNTER — Ambulatory Visit: Payer: Medicare Other | Attending: Family Medicine | Admitting: Family Medicine

## 2023-05-03 ENCOUNTER — Other Ambulatory Visit (INDEPENDENT_AMBULATORY_CARE_PROVIDER_SITE_OTHER): Payer: Medicare Other

## 2023-05-03 ENCOUNTER — Encounter (INDEPENDENT_AMBULATORY_CARE_PROVIDER_SITE_OTHER): Payer: Self-pay | Admitting: Family Medicine

## 2023-05-03 ENCOUNTER — Other Ambulatory Visit: Payer: Self-pay

## 2023-05-03 VITALS — BP 118/80 | HR 76 | Temp 97.3°F | Resp 15 | Ht 75.0 in | Wt 228.0 lb

## 2023-05-03 DIAGNOSIS — I2699 Other pulmonary embolism without acute cor pulmonale: Secondary | ICD-10-CM | POA: Insufficient documentation

## 2023-05-03 DIAGNOSIS — I129 Hypertensive chronic kidney disease with stage 1 through stage 4 chronic kidney disease, or unspecified chronic kidney disease: Secondary | ICD-10-CM

## 2023-05-03 DIAGNOSIS — E782 Mixed hyperlipidemia: Secondary | ICD-10-CM | POA: Insufficient documentation

## 2023-05-03 DIAGNOSIS — N1831 Chronic kidney disease, stage 3a (CMS HCC): Secondary | ICD-10-CM

## 2023-05-03 DIAGNOSIS — Z86711 Personal history of pulmonary embolism: Secondary | ICD-10-CM

## 2023-05-03 DIAGNOSIS — E1122 Type 2 diabetes mellitus with diabetic chronic kidney disease: Secondary | ICD-10-CM | POA: Insufficient documentation

## 2023-05-03 DIAGNOSIS — I480 Paroxysmal atrial fibrillation: Secondary | ICD-10-CM | POA: Insufficient documentation

## 2023-05-03 DIAGNOSIS — I10 Essential (primary) hypertension: Secondary | ICD-10-CM

## 2023-05-03 DIAGNOSIS — D649 Anemia, unspecified: Secondary | ICD-10-CM

## 2023-05-03 DIAGNOSIS — G4730 Sleep apnea, unspecified: Secondary | ICD-10-CM | POA: Insufficient documentation

## 2023-05-03 DIAGNOSIS — N400 Enlarged prostate without lower urinary tract symptoms: Secondary | ICD-10-CM | POA: Insufficient documentation

## 2023-05-03 DIAGNOSIS — Z7984 Long term (current) use of oral hypoglycemic drugs: Secondary | ICD-10-CM

## 2023-05-03 DIAGNOSIS — I240 Acute coronary thrombosis not resulting in myocardial infarction: Secondary | ICD-10-CM | POA: Insufficient documentation

## 2023-05-03 DIAGNOSIS — Z7901 Long term (current) use of anticoagulants: Secondary | ICD-10-CM

## 2023-05-03 DIAGNOSIS — I25110 Atherosclerotic heart disease of native coronary artery with unstable angina pectoris: Secondary | ICD-10-CM | POA: Insufficient documentation

## 2023-05-03 DIAGNOSIS — K900 Celiac disease: Secondary | ICD-10-CM

## 2023-05-03 LAB — CBC WITH DIFF
BASOPHIL #: 0.11 10*3/uL (ref <=0.20)
BASOPHIL %: 2 %
EOSINOPHIL #: 0.32 10*3/uL (ref <=0.50)
EOSINOPHIL %: 5.9 %
HCT: 47.8 % (ref 38.9–52.0)
HGB: 15.3 g/dL (ref 13.4–17.5)
IMMATURE GRANULOCYTE #: <0.1 10*3/uL (ref <0.10)
IMMATURE GRANULOCYTE %: 0.2 % (ref 0.0–1.0)
LYMPHOCYTE #: 2.08 10*3/uL (ref 1.00–4.80)
LYMPHOCYTE %: 38.2 %
MCH: 28.5 pg (ref 26.0–32.0)
MCHC: 32 g/dL (ref 31.0–35.5)
MCV: 89.2 fL (ref 78.0–100.0)
MONOCYTE #: 0.57 10*3/uL (ref 0.20–1.10)
MONOCYTE %: 10.5 %
MPV: 12.1 fL (ref 8.7–12.5)
NEUTROPHIL #: 2.36 10*3/uL (ref 1.50–7.70)
NEUTROPHIL %: 43.2 %
PLATELETS: 266 10*3/uL (ref 150–400)
RBC: 5.36 10*6/uL (ref 4.50–6.10)
RDW-CV: 13.6 % (ref 11.5–15.5)
WBC: 5.5 10*3/uL (ref 3.7–11.0)

## 2023-05-03 LAB — LIPID PANEL
CHOLESTEROL: 137 mg/dL (ref 0–199)
HDL CHOL: 44 mg/dL (ref 40–60)
LDL CALC: 37 mg/dL (ref 0–130)
TRIGLYCERIDES: 279 mg/dL — ABNORMAL HIGH (ref 0–149)

## 2023-05-03 LAB — COMPREHENSIVE METABOLIC PNL, FASTING
ALBUMIN: 5.1 g/dL — ABNORMAL HIGH (ref 3.5–5.0)
ALKALINE PHOSPHATASE: 48 U/L (ref 38–126)
ALT (SGPT): 42 U/L (ref <=50)
ANION GAP: 13 mmol/L
AST (SGOT): 36 U/L (ref 17–59)
BILIRUBIN TOTAL: 0.5 mg/dL (ref 0.2–1.3)
BUN/CREA RATIO: 17
BUN: 26 mg/dL — ABNORMAL HIGH (ref 9–20)
CALCIUM: 10.1 mg/dL (ref 8.4–10.2)
CHLORIDE: 104 mmol/L (ref 98–107)
CO2 TOTAL: 23 mmol/L (ref 22–30)
CREATININE: 1.56 mg/dL — ABNORMAL HIGH (ref 0.66–1.25)
ESTIMATED GFR: 48 mL/min/{1.73_m2} — ABNORMAL LOW (ref >=60)
GLUCOSE: 150 mg/dL — ABNORMAL HIGH (ref 74–106)
POTASSIUM: 4.7 mmol/L (ref 3.5–5.1)
PROTEIN TOTAL: 8.1 g/dL (ref 6.3–8.2)
SODIUM: 140 mmol/L (ref 137–145)

## 2023-05-03 LAB — IRON TRANSFERRIN AND TIBC
IRON (TRANSFERRIN) SATURATION: 17 % — ABNORMAL LOW (ref 20–55)
IRON: 81 ug/dL (ref 49–181)
TOTAL IRON BINDING CAPACITY: 468 ug/dL — ABNORMAL HIGH (ref 261–462)

## 2023-05-03 LAB — VITAMIN B12: VITAMIN B 12: 480 pg/mL (ref 239–931)

## 2023-05-03 LAB — MAGNESIUM: MAGNESIUM: 2.1 mg/dL (ref 1.6–2.3)

## 2023-05-03 LAB — HGA1C (HEMOGLOBIN A1C WITH EST AVG GLUCOSE)
ESTIMATED AVERAGE GLUCOSE: 171 mg/dL
HEMOGLOBIN A1C: 7.6 % — ABNORMAL HIGH (ref <5.7)

## 2023-05-03 LAB — THYROID STIMULATING HORMONE (SENSITIVE TSH): TSH: 2.01 u[IU]/mL (ref 0.465–4.680)

## 2023-05-03 LAB — FERRITIN: FERRITIN: 44 ng/mL (ref 18–464)

## 2023-05-03 MED ORDER — SILODOSIN 4 MG CAPSULE
8.0000 mg | ORAL_CAPSULE | Freq: Every day | ORAL | 1 refills | Status: DC
Start: 2023-05-03 — End: 2023-05-04

## 2023-05-03 NOTE — Ancillary Notes (Signed)
[place]    Venipuncture performed in office on right arm antecubital vein, dry pressure dressing was applied to site and patient tolerated it well.  Specimen was centrifuged, aliquoted as needed and specimen was labeled and packaged for transport.    Merry Proud, PHLEBOTOMIST  05/03/2023, 08:58

## 2023-05-03 NOTE — Progress Notes (Signed)
[facility], [place]  [address] [place] [address]  Operated by [facility]  Progress Note    Name: Dustin Webster MRN:  [id number]   Date: 05/03/2023 DOB:  14-May-1956 (67 y.o.)             Reason for Visit: Medication Refill, Follow Up (Pt states he just moved back to the area. States cardiology just Dx him with a blockage in his heart. ), Diabetes (Pt states his blood sugar has been elevated at 179. But he does not check his blood sugar's at home. ), Sleep Apnea (States he was Dx with sleep apnea while in [place] but the company that they used will not ship his supplies to [facility]), and CKD Stage 3 (Reports this is getting worse. )    History of Present Illness  Dustin Webster is a 67 y.o. male who is being seen today for fu    Problem   Sleep Apnea    Diagnosed in [place] but cannot get his supplies here.  States doing well.  Trying to get records so we can order supplies.       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  Decreased metformin due to diarrhea.  Has started farxiga for this and cardiac and renal disease.  Has not been checking hs sugars as regularly since move home as well.  Intolerant to statins but is on ARB      Stage 3 Chronic Kidney Disease (Cms Hcc)    Has progressed.  On cozaar and farxiga now for renal protection.  Trying to keep sugars controlled.  Due for labs.  No urinary or other complaints.      Paroxysmal Atrial Fibrillation (Cms Hcc)    Now on Eliquis.  No rate controllers follows with cardiology see below no palpitaitons or dizziness.      Anemia    While in [place] had significant GI bleeding.  Has changed to Eliquis from coumadin due to recurrent PE.  Due for repeat labs but no active bleeding no change in energy level.       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  But while living in [place] had significant issues with GI bleeding excessive.  Changed to  Eliquis and doing well.  No chest pain or dyspnea or hemoptysis.       Mixed Hyperlipidemia    Current Medication: repatha TriCor 145 mg daily repatha   Side Effects of Medication: none  History of Statin Intolerance: yes  Diet: fairly healthy gluten free  Exercise: starting            Htn (Hypertension)    Blood Pressure Medications: Norvasc 10 mg daily, cozaar 25 mg daily Toprol XL 25 mg daily   Blood Pressure Measurements at home: normal   Concerning Symptoms: no chest pain or dyspnea no edema   Medication Side Effects: none  Diet: fairly healthy  gluten free   Exercise:starting   Specialist Care: cardiology           Benign Prostatic Hyperplasia    Doing well with rapaflo.  Increased to 4 mg bid just before moving home and states has done well with this needs refill      Cad (Coronary Artery Disease), Native Coronary Artery      Aspirin: Yes  Beta blockers: Yes  Statins: repatha  due to intolerance as well as TriCor     Just moved back from [place] - now on farxiga and cozaar and Toprol.  Was told had another blockage but will be seeing cardiology soon for management options.  Getting records from [facility].  Is due for repeat labs           Past Medical History:   Diagnosis Date   . Anemia    . Atrial fibrillation (CMS HCC)    . Blockage of coronary artery of heart (CMS HCC)    . BPH associated with nocturia    . CAD (coronary artery disease)    . Chronic depression 05/23/2018   . CKD (chronic kidney disease)    . COVID-19 08/03/2020   . Diet-controlled diabetes mellitus (CMS HCC) 05/23/2018   . DVT (deep venous thrombosis) (CMS HCC)    . Gastroesophageal reflux disease without esophagitis 05/23/2018   . Insomnia 05/23/2018   . 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 Pericardial effusion    . Pulmonary emboli (CMS HCC)    . Sleep apnea            Past Surgical History:   Procedure Laterality Date   . HX CHOLECYSTECTOMY     . HX CORONARY ARTERY BYPASS GRAFT     . HX HEART SURGERY  12/2015   . HX HERNIA  REPAIR      Inguinal    . HX LIPOMA RESECTION      Chest   . OTHER SURGICAL HISTORY Left 01/2016    Evacuation of hematoma -Left Atrium  at [facility]    . SKIN CANCER EXCISION  06/21/2013    melanoma/ear/[facility]   . TRANSURETHRAL MICROWAVE THERAPY  05/24/2018    Dr Dustin Webster           Medications  amLODIPine (NORVASC) 10 mg Oral Tablet, Take 1 Tablet (10 mg total) by mouth Once a day  apixaban (ELIQUIS) 5 mg Oral Tablet, Take 1 Tablet (5 mg total) by mouth Twice daily  Calcium Carbonate (OS-CAL) 650 mg calcium (1,625 mg) Oral Tablet, Every evening   Cholecalciferol, Vitamin D3, 50 mcg (2,000 unit) Oral Capsule, Take by mouth  citalopram (CELEXA) 10 mg Oral Tablet, TAKE 1 TABLET EVERY DAY  dapagliflozin propanediol (FARXIGA) 10 mg Oral Tablet, Take 1 Tablet (10 mg total) by mouth Once a day  evolocumab (REPATHA SURECLICK) 140 mg/mL Subcutaneous Pen Injector, Inject one pen (140 mg) under the skin Every 30 days  famotidine (PEPCID) 40 mg Oral Tablet, Take 1 Tablet (40 mg total) by mouth Twice daily  fenofibrate nanocrystallized (TRICOR) 145 mg Oral Tablet, TAKE 1 TABLET EVERY MORNING WITH BREAKFAST  fluocinonide (LIDEX) 0.05 % Cream, Apply topically Twice daily  folic acid (FOLVITE) 1 mg Oral Tablet, Take 1 Tablet (1 mg total) by mouth Once a day  hydrocortisone 2.5 % Apply externally cream with perineal applicator, Insert into the rectum Twice per day as needed  KRILL OIL ORAL, Take by mouth  losartan (COZAAR) 25 mg Oral Tablet, Take 1 Tablet (25 mg total) by mouth Once a day  LUTEIN ORAL, Take by mouth  melatonin 1 mg Oral Tablet, As directed  metFORMIN (GLUCOPHAGE) 500 mg Oral Tablet, Take 1 Tablet (500 mg total) by mouth Twice daily with food  metoprolol succinate (TOPROL-XL) 25 mg Oral Tablet Sustained Release 24 hr, Take 1 Tablet (25 mg total) by mouth Once a day  multivit-min/ferrous fumarate (MULTI VITAMIN ORAL), Take 1 Tablet by  mouth Once a day  nitroGLYCERIN (NITROSTAT) 0.4 mg Sublingual Tablet, Sublingual, 1 Tablet (0.4  mg total) by Sublingual route Every 5 minutes as needed for Chest pain for 3 doses over 15 minutes  sildenafiL, pulm.hypertension, (REVATIO) 20 mg Tablet, 2 to 5 tabs po q day prn  allopurinoL (ZYLOPRIM) 100 mg Oral Tablet, TAKE 1 TABLET DAILY FOR PREVENTION OF CALCIUM-CONTAINING KIDNEY STONES  ascorbic acid, vitamin C, (VITAMIN C) 500 mg Oral Tablet, 500 mg Once a day   aspirin (ECOTRIN) 81 mg Oral Tablet, Delayed Release (E.C.), Once a day  azelastine (OPTIVAR) 0.05 % Ophthalmic Drops,   cholestyramine-sucrose (QUESTRAN) 4 gram Oral Powder in Packet, Take 1 Packet by mouth Once a day  Cyanocobalamin-Cobamamide (B12) 5,000-100 mcg Sublingual Lozenge, Place under the tongue  ferrous sulfate (FERATAB) 324 mg (65 mg iron) Oral Tablet, Delayed Release (E.C.), Take 1 Tablet (324 mg total) by mouth Once a day  fluorometholone (FML LIQUIFILM) 0.1 % Ophthalmic Drops, Suspension,   Silodosin (RAPAFLO) 4 mg Oral Capsule, Take 1 Capsule (4 mg total) by mouth Once a day  terbinafine HCL (LAMISIL AT) 1 % Cream, APPLY TO AFFECTED AREAS TWICE A DAY 2-4X A WEEK  triamcinolone acetonide (ARISTOCORT A) 0.1 % Ointment, APPLY TO AFFECTED AREA TWICE A DAY AS NEEDED  UBIDECARENONE (COQ-10 ORAL), Once a day  warfarin (COUMADIN) 10 mg Oral Tablet, Daily or as directed  Warfarin (COUMADIN) 4 mg Oral Tablet, Take one tablet by mouth once daily or as directed.    No facility-administered medications prior to visit.      Allergies   Allergen Reactions   . Gluten      Celiac disease   . Statin [Atorvastatin] Myalgia     All statin medications       Family Medical History:       Problem Relation (Age of Onset)    Emphysema Father    Heart Attack General Family Hx    Heart Disease Mother    Hypertension (High Blood Pressure) Mother    No Known Problems Other    Stroke Mother              Social History     Tobacco Use   . Smoking status: Never   . Smokeless tobacco: Never   Vaping Use   . Vaping status: Never Used   Substance Use Topics   .  Alcohol use: Not Currently     Comment: rare   . Drug use: Never       Review of Systems  Pertinent positives are listed in HPI    Physical Exam:  BP 118/80 (Site: Right, Patient Position: Sitting, Cuff Size: Adult Large)   Pulse 76   Temp 36.3 C (97.3 F) (Temporal)   Resp 15   Ht 1.905 m (6\' 3" )   Wt 103 kg (228 lb)   SpO2 99%   BMI 28.50 kg/m       Physical Exam  Vitals and nursing note reviewed.   Constitutional:       General: He is not in acute distress.     Appearance: Normal appearance.   Neck:      Vascular: No carotid bruit.   Cardiovascular:      Rate and Rhythm: Normal rate and regular rhythm.      Heart sounds: No murmur heard.  Pulmonary:      Effort: Pulmonary effort is normal.      Breath sounds: Normal breath sounds.   Abdominal:  General: Bowel sounds are normal.      Palpations: Abdomen is soft.      Tenderness: There is no abdominal tenderness.   Musculoskeletal:      Cervical back: Neck supple.      Right lower leg: No edema.      Left lower leg: No edema.   Lymphadenopathy:      Cervical: No cervical adenopathy.   Neurological:      Mental Status: He is alert and oriented to person, place, and time.   Psychiatric:         Mood and Affect: Mood normal.                     Assessment/Plan:    Problem List Items Addressed This Visit          Cardiovascular System    Pulmonary emboli (CMS HCC)     Monitor with Eliquis closely          Mixed hyperlipidemia     Continue medication follow with cardiology as well          Relevant Orders    COMPREHENSIVE METABOLIC PNL, FASTING (Completed)    CBC/DIFF (Completed)    LIPID PANEL (Completed)    MAGNESIUM (Completed)    HGA1C (HEMOGLOBIN A1C WITH EST AVG GLUCOSE) (Completed)    CBC/DIFF    IRON TRANSFERRIN AND TIBC (Completed)    FERRITIN (Completed)    VITAMIN B12 (Completed)    THYROID STIMULATING HORMONE (SENSITIVE TSH) (Completed)    HTN (hypertension)     Continue current treatment monitor labs          Relevant Orders    COMPREHENSIVE  METABOLIC PNL, FASTING (Completed)    CBC/DIFF (Completed)    LIPID PANEL (Completed)    MAGNESIUM (Completed)    HGA1C (HEMOGLOBIN A1C WITH EST AVG GLUCOSE) (Completed)    CBC/DIFF    IRON TRANSFERRIN AND TIBC (Completed)    FERRITIN (Completed)    VITAMIN B12 (Completed)    THYROID STIMULATING HORMONE (SENSITIVE TSH) (Completed)    CAD (coronary artery disease), native coronary artery     Await records and recommendations cardiology.  Has NTG has not needed to use.           Paroxysmal atrial fibrillation (CMS HCC)     Continue to monitor with cardiology as below          Blockage of coronary artery of heart (CMS HCC)       Respiratory    Sleep apnea - Primary     Pt may be able to print off sleep study and when results available will be able to order supplies             Nephrology    Stage 3 chronic kidney disease (CMS HCC)     Monitor labs likely nephrology consult         Type 2 diabetes mellitus with diabetic chronic kidney disease (CMS HCC)     Monitor labs and increase metformin to bid dosing continue with rest medications tighter diet control further recommendations based on labs          Relevant Orders    COMPREHENSIVE METABOLIC PNL, FASTING (Completed)    CBC/DIFF (Completed)    LIPID PANEL (Completed)    MAGNESIUM (Completed)    HGA1C (HEMOGLOBIN A1C WITH EST AVG GLUCOSE) (Completed)    CBC/DIFF    IRON TRANSFERRIN AND TIBC (Completed)    FERRITIN (Completed)  VITAMIN B12 (Completed)    THYROID STIMULATING HORMONE (SENSITIVE TSH) (Completed)       Digestive    Celiac disease    Relevant Orders    COMPREHENSIVE METABOLIC PNL, FASTING (Completed)    CBC/DIFF (Completed)    LIPID PANEL (Completed)    MAGNESIUM (Completed)    HGA1C (HEMOGLOBIN A1C WITH EST AVG GLUCOSE) (Completed)    CBC/DIFF    IRON TRANSFERRIN AND TIBC (Completed)    FERRITIN (Completed)    VITAMIN B12 (Completed)    THYROID STIMULATING HORMONE (SENSITIVE TSH) (Completed)       Hematologic/Lymphatic    Anemia, unspecified type     Relevant Orders    COMPREHENSIVE METABOLIC PNL, FASTING (Completed)    CBC/DIFF (Completed)    LIPID PANEL (Completed)    MAGNESIUM (Completed)    HGA1C (HEMOGLOBIN A1C WITH EST AVG GLUCOSE) (Completed)    CBC/DIFF    IRON TRANSFERRIN AND TIBC (Completed)    FERRITIN (Completed)    VITAMIN B12 (Completed)    THYROID STIMULATING HORMONE (SENSITIVE TSH) (Completed)       Urology    Benign prostatic hyperplasia     Refill sent              Orders Placed This Encounter   . COMPREHENSIVE METABOLIC PNL, FASTING   . CBC/DIFF   . LIPID PANEL   . MAGNESIUM   . HGA1C (HEMOGLOBIN A1C WITH EST AVG GLUCOSE)   . CBC/DIFF   . IRON TRANSFERRIN AND TIBC   . FERRITIN   . VITAMIN B12   . THYROID STIMULATING HORMONE (SENSITIVE TSH)      Medications Discontinued During This Encounter   Medication Reason   . cholestyramine-sucrose (QUESTRAN) 4 gram Oral Powder in Packet Therapy completed   . allopurinoL (ZYLOPRIM) 100 mg Oral Tablet Patient states no longer taking   . ascorbic acid, vitamin C, (VITAMIN C) 500 mg Oral Tablet Patient states no longer taking   . aspirin (ECOTRIN) 81 mg Oral Tablet, Delayed Release (E.C.) Patient states no longer taking   . azelastine (OPTIVAR) 0.05 % Ophthalmic Drops Therapy completed   . Cyanocobalamin-Cobamamide (B12) 5,000-100 mcg Sublingual Lozenge Patient states no longer taking   . ferrous sulfate (FERATAB) 324 mg (65 mg iron) Oral Tablet, Delayed Release (E.C.) Patient states no longer taking   . fluorometholone (FML LIQUIFILM) 0.1 % Ophthalmic Drops, Suspension Therapy completed   . terbinafine HCL (LAMISIL AT) 1 % Cream Therapy completed   . triamcinolone acetonide (ARISTOCORT A) 0.1 % Ointment Therapy completed   . UBIDECARENONE (COQ-10 ORAL) Patient states no longer taking   . warfarin (COUMADIN) 10 mg Oral Tablet Patient states no longer taking   . Warfarin (COUMADIN) 4 mg Oral Tablet Patient states no longer taking   . Silodosin (RAPAFLO) 4 mg Oral Capsule Reorder         Follow up: Return in  about 3 months (around 08/03/2023).    Seek medical attention for new or worsening symptoms.    Patient has been seen in this clinic within the last 3 years.     Lacy Duverney, MD     This note was partially created using MModal Fluency Direct system (voice recognition software) and is inherently subject to errors including those of syntax and "sound-alike" substitutions which may escape proofreading.  In such instances, original meaning may be extrapolated by contextual derivation.On farxiga and losartan for kidneys and doing well renal function stabilizing gfr 46-47  Excessive bleeding with coumadin GI  related now on Eliquis  New heart blockage stress test done told medical management     Blood sugars an issue - was 2 metfomrin but diarreha now one a day and on farxiga still not controlled back to 2 metfromin a day off cholestyramine and diarrhea better    Sleep apnea needs orders

## 2023-05-03 NOTE — Nursing Note (Signed)
05/03/23 0827   Recent Weight Change   Have you had a recent unexplained weight loss or gain? N   Health Education and Literacy   How often do you have a problem understanding what is told to you about your medical condition?  Never   Domestic Violence   Because we are aware of abuse and domestic violence today, we ask all patients: Are you being hurt, hit, or frightened by anyone at your home or in your life?  N   Basic Needs   Do you have any basic needs within your home that are not being met? (such as Food, Shelter, Civil [employer] fast streamer, Tranportation, paying for bills and/or medications) N   Advanced Directives   Do you have any advanced directives? No Advance   Would you like an advanced directive packet? Refused Packet     PHQ Questionnaire  Little interest or pleasure in doing things.: Not at all  Feeling down, depressed, or hopeless: Several Days  PHQ 2 Total: 1  Trouble falling or staying asleep, or sleeping too much.: Nearly every day  Feeling tired or having little energy: Nearly every day  Poor appetite or overeating: Not at all  Feeling bad about yourself/ that you are a failure in the past 2 weeks?: Several Days  Trouble concentrating on things in the past 2 weeks?: Not at all  Moving/Speaking slowly or being fidgety or restless  in the past 2 weeks?: Not at all  Thoughts that you would be better off DEAD, or of hurting yourself in some way.: Not at all  If you checked off any problems, how difficult have these problems made it for you to do your work, take care of things at home, or get along with other people?: Not difficult at all  PHQ 9 Total: 8  Interpretation of Total Score: 5-9 Mild depression    Fall Risk Assessment  Do you feel unsteady when standing or walking?: No  Do you worry about falling?: No  Have you fallen in the past year?: No    [employer], MA  05/03/2023, 08:28

## 2023-05-04 ENCOUNTER — Other Ambulatory Visit (INDEPENDENT_AMBULATORY_CARE_PROVIDER_SITE_OTHER): Payer: Self-pay | Admitting: Family Medicine

## 2023-05-04 ENCOUNTER — Encounter (INDEPENDENT_AMBULATORY_CARE_PROVIDER_SITE_OTHER): Payer: Self-pay | Admitting: Family Medicine

## 2023-05-04 DIAGNOSIS — N138 Other obstructive and reflux uropathy: Secondary | ICD-10-CM

## 2023-05-04 MED ORDER — SILODOSIN 8 MG CAPSULE
8.0000 mg | ORAL_CAPSULE | Freq: Every day | ORAL | 1 refills | Status: DC
Start: 2023-05-04 — End: 2023-08-18

## 2023-05-04 NOTE — Telephone Encounter (Signed)
Insurance requesting formulary change. Rx updated to Silodosin 8mg  1 tab daily and routed to Dr. Renaldo Fiddler for approval. Axel Filler, RN 05/04/2023 15:29

## 2023-05-05 ENCOUNTER — Telehealth (INDEPENDENT_AMBULATORY_CARE_PROVIDER_SITE_OTHER): Payer: Self-pay | Admitting: Family Medicine

## 2023-05-05 DIAGNOSIS — N1831 Chronic kidney disease, stage 3a (CMS HCC): Secondary | ICD-10-CM

## 2023-05-05 NOTE — Telephone Encounter (Signed)
My chart message to Pt:      I will forward your message to Dr Renaldo Fiddler.  She has NOT reviewed your labs at this time this can take up to 7-10 days. Once she does we will let you know what she recommends for you to do from your results.  We will notify you by phone or via my chart message.  Please give Dr time to review your results.    If you have any further questions or concerns please let us know    Comments added by RLM, CMA  on 05/05/23 at 08:22.

## 2023-05-05 NOTE — Telephone Encounter (Signed)
-----   Message from Deirdre Peer. Starlin sent at 05/04/2023  6:26 PM EDT -----  Regarding: A1c results  Contact: [phone]  My last A1c from [place] was 7.5  This one is 7.6 with addition of 2nd Metformin pill. Should I start on insulin? If not how high should it be to consider insulin?

## 2023-05-06 NOTE — Assessment & Plan Note (Signed)
Continue medication follow with cardiology as well

## 2023-05-06 NOTE — Assessment & Plan Note (Signed)
Monitor labs likely nephrology consult

## 2023-05-06 NOTE — Assessment & Plan Note (Signed)
Refill sent.

## 2023-05-06 NOTE — Assessment & Plan Note (Signed)
Monitor with Eliquis closely

## 2023-05-06 NOTE — Assessment & Plan Note (Signed)
Continue to monitor with cardiology as below

## 2023-05-06 NOTE — Assessment & Plan Note (Signed)
Monitor labs and increase metformin to bid dosing continue with rest medications tighter diet control further recommendations based on labs

## 2023-05-06 NOTE — Assessment & Plan Note (Signed)
Pt may be able to print off sleep study and when results available will be able to order supplies

## 2023-05-06 NOTE — Assessment & Plan Note (Signed)
Continue current treatment monitor labs

## 2023-05-06 NOTE — Assessment & Plan Note (Signed)
Await records and recommendations cardiology.  Has NTG has not needed to use.

## 2023-05-08 NOTE — Telephone Encounter (Signed)
Continue bid dosing of metformin - if just restarted will take 3 months to really see a change.  Will consider additions based on A1c 3 months     Ok for referral to [facility] nephrology

## 2023-05-08 NOTE — Telephone Encounter (Signed)
I spoke with pt and let him know that Dr Adkins wants him to continue on Metformin 500mg BID and then have lab work repeated in 3 months.     Pt voiced understanding.    I advised him that i would place the orders in the chart and he could stop in and have done.  And we would refer to nephrology. Pt voiced understanding.  Comments added by RLM, CMA  on 05/08/23 at 14:06.

## 2023-05-08 NOTE — Result Encounter Note (Signed)
I spoke with pt and let him know that Dr Renaldo Fiddler wants him to continue on Metformin 500mg  BID and then have lab work repeated in 3 months.     Pt voiced understanding.    I advised him that i would place the orders in the chart and he could stop in and have done.  And we would refer to nephrology. Pt voiced understanding.  Comments added by RLM, CMA  on 05/08/23 at 14:06.

## 2023-05-08 NOTE — Telephone Encounter (Signed)
-----   Message from Lacy Duverney, MD sent at 05/08/2023  7:17 AM EDT -----  Kidney function noted - will be seeing nephrology had been started on farxiga  Sugars are higher - not to point of insulin yet continue to watch sugars and startches/carbs in diet and try and increase metformin to bid dosing.  Start checking sugars regularly as this can help with control as well.  Dietary changes will also help tg   Iron levels stable   Repeat cmp lipids and A1c in 3 months

## 2023-05-08 NOTE — Result Encounter Note (Signed)
I called pt and went over the lab results and recommendations.   Pt states that he is started a couple of weeks prior to this lab results taking metformin 500mg  BID and he is on [place] 10mg   daily.   Do you want to change this dosage or stay the same and try and change diet and recheck labs in 3 months.?   Do you want [place] to set him up to see nephrology due to he does not have an appointment with anyone locally .  He sates that he used see them but has not for years?   Comments added by RLM, CMA  on 05/08/23 at 09:36

## 2023-05-08 NOTE — Telephone Encounter (Signed)
See labs answered in annotation

## 2023-05-08 NOTE — Telephone Encounter (Signed)
I called pt and went over the lab results and recommendations.     Pt states that he is started a couple of weeks prior to this lab results taking metformin 500mg  BID and he is on [place] 10mg   daily.     Do you want to change this dosage or stay the same and try and change diet and recheck labs in 3 months.?     Do you want [place] to set him up to see nephrology due to he does not have an appointment with anyone locally .  He sates that he used see them but has not for years?       Comments added by RLM, CMA  on 05/08/23 at 09:35.

## 2023-05-26 ENCOUNTER — Other Ambulatory Visit: Payer: Self-pay

## 2023-05-26 ENCOUNTER — Encounter (HOSPITAL_BASED_OUTPATIENT_CLINIC_OR_DEPARTMENT_OTHER): Payer: Self-pay

## 2023-05-26 ENCOUNTER — Ambulatory Visit (INDEPENDENT_AMBULATORY_CARE_PROVIDER_SITE_OTHER): Payer: Medicare Other

## 2023-05-26 VITALS — BP 120/76 | HR 79 | Ht 75.0 in | Wt 229.0 lb

## 2023-05-26 DIAGNOSIS — I2510 Atherosclerotic heart disease of native coronary artery without angina pectoris: Secondary | ICD-10-CM

## 2023-05-26 DIAGNOSIS — E782 Mixed hyperlipidemia: Secondary | ICD-10-CM

## 2023-05-26 DIAGNOSIS — Z86718 Personal history of other venous thrombosis and embolism: Secondary | ICD-10-CM

## 2023-05-26 DIAGNOSIS — I2699 Other pulmonary embolism without acute cor pulmonale: Secondary | ICD-10-CM

## 2023-05-26 DIAGNOSIS — Z951 Presence of aortocoronary bypass graft: Secondary | ICD-10-CM

## 2023-05-26 DIAGNOSIS — I10 Essential (primary) hypertension: Secondary | ICD-10-CM

## 2023-05-26 DIAGNOSIS — I480 Paroxysmal atrial fibrillation: Secondary | ICD-10-CM

## 2023-05-26 MED ORDER — REPATHA SURECLICK 140 MG/ML SUBCUTANEOUS PEN INJECTOR
140.0000 mg | PEN_INJECTOR | SUBCUTANEOUS | 3 refills | Status: DC
Start: 2023-05-26 — End: 2023-07-07

## 2023-05-26 NOTE — Progress Notes (Addendum)
[facility]  [address]  [place] [address]  [phone]    Date:   05/26/2023  Name: Dustin Webster  Age: 67 y.o.    REASON FOR VISIT:   Follow Up     HISTORY OF PRESENTING ILLNESS:    Dustin Webster is a 67 y.o. male with cardiac history significant for CAD status post CABG, hypertension, hyperlipidemia, paroxysmal atrial fibrillation, history of deep vein thrombosis/PE and type 2 diabetes.  He was last seen in the office September 2022 and was doing well from a cardiovascular standpoint.    The patient presents today for a follow-up appointment.  Patient was has been living in [place].  Patient states he had a recent abnormal stress test in [place].  Patient states he did have some chest discomfort and shortness for breath that requires 1 nitroglycerin every 2 weeks with relief of discomfort.  No provoking factors noted.  Patient states he was started on metoprolol with some improvement of chest discomfort as well as palpitations.  He remains on Eliquis for thromboembolic protection and denies bleeding complications.  Blood pressure heart rate are well controlled.  Most recent LDL 37.  Most recent A1c 7.6. The patient denies chest pain, shortness of breath, palpitations, fatigue, dizziness, near-syncope, syncope, lower extremity edema, orthopnea or PND.      Current Outpatient Medications   Medication Sig    amLODIPine (NORVASC) 10 mg Oral Tablet Take 1 Tablet (10 mg total) by mouth Once a day    apixaban (ELIQUIS) 5 mg Oral Tablet Take 1 Tablet (5 mg total) by mouth Twice daily    Calcium Carbonate (OS-CAL) 650 mg calcium (1,625 mg) Oral Tablet Every evening     Cholecalciferol, Vitamin D3, 50 mcg (2,000 unit) Oral Capsule Take by mouth    citalopram (CELEXA) 10 mg Oral Tablet TAKE 1 TABLET EVERY DAY    dapagliflozin propanediol (FARXIGA) 10 mg Oral Tablet Take 1 Tablet (10 mg total) by mouth Once a day    evolocumab (REPATHA SURECLICK) 140 mg/mL  Subcutaneous Pen Injector Inject 1 mL (140 mg total) under the skin Every 14 days    famotidine (PEPCID) 40 mg Oral Tablet Take 1 Tablet (40 mg total) by mouth Twice daily    fenofibrate nanocrystallized (TRICOR) 145 mg Oral Tablet TAKE 1 TABLET EVERY MORNING WITH BREAKFAST    fluocinonide (LIDEX) 0.05 % Cream Apply topically Twice daily    folic acid (FOLVITE) 1 mg Oral Tablet Take 1 Tablet (1 mg total) by mouth Once a day    hydrocortisone 2.5 % Apply externally cream with perineal applicator Insert into the rectum Twice per day as needed    KRILL OIL ORAL Take by mouth    losartan (COZAAR) 25 mg Oral Tablet Take 1 Tablet (25 mg total) by mouth Once a day    LUTEIN ORAL Take by mouth    melatonin 1 mg Oral Tablet As directed    metFORMIN (GLUCOPHAGE) 500 mg Oral Tablet Take 1 Tablet (500 mg total) by mouth Twice daily with food    metoprolol succinate (TOPROL-XL) 25 mg Oral Tablet Sustained Release 24 hr Take 1 Tablet (25 mg total) by mouth Once a day    multivit-min/ferrous fumarate (MULTI VITAMIN ORAL) Take 1 Tablet by mouth Once a day    nitroGLYCERIN (NITROSTAT) 0.4 mg Sublingual Tablet, Sublingual 1 Tablet (0.4 mg total) by Sublingual route Every 5 minutes as needed for Chest pain for 3 doses over 15  minutes    sildenafiL, pulm.hypertension, (REVATIO) 20 mg Tablet 2 to 5 tabs po q day prn (Patient not taking: Reported on 05/26/2023)    silodosin (RAPAFLO) 8 mg Oral Capsule Take 1 Capsule (8 mg total) by mouth Once a day     Allergies   Allergen Reactions    Gluten      Celiac disease    Statin [Atorvastatin] Myalgia     All statin medications     Past Medical History:   Diagnosis Date    Anemia     Atrial fibrillation (CMS HCC)     Blockage of coronary artery of heart (CMS HCC)     BPH associated with nocturia     CAD (coronary artery disease)     Chronic depression 05/23/2018    CKD (chronic kidney disease)     COVID-19 08/03/2020    Diet-controlled diabetes mellitus (CMS HCC) 05/23/2018    DVT (deep venous  thrombosis) (CMS HCC)     Gastroesophageal reflux disease without esophagitis 05/23/2018    Insomnia 05/23/2018    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    Pericardial effusion     Pulmonary emboli (CMS HCC)     Sleep apnea          Past Surgical History:   Procedure Laterality Date    HX CHOLECYSTECTOMY      HX CORONARY ARTERY BYPASS GRAFT      HX HEART SURGERY  12/2015    HX HERNIA REPAIR      Inguinal     HX LIPOMA RESECTION      Chest    OTHER SURGICAL HISTORY Left 01/2016    Evacuation of hematoma -[facility]     SKIN CANCER EXCISION  06/21/2013    melanoma/ear/[facility]    TRANSURETHRAL MICROWAVE THERAPY  05/24/2018    Dr Evert Kohl         Family Medical History:       Problem Relation (Age of Onset)    Emphysema Father    Heart Attack General Family Hx    Heart Disease Mother    Hypertension (High Blood Pressure) Mother    No Known Problems Other    Stroke Mother            Social History     Socioeconomic History    Marital status: Married     Spouse name: Marylene Land    Number of children: 1   Occupational History    Occupation: Other Comptroller     Comment: Retired   Tobacco Use    Smoking status: Never    Smokeless tobacco: Never   Vaping Use    Vaping status: Never Used   Substance and Sexual Activity    Alcohol use: Not Currently     Comment: rare    Drug use: Never    Sexual activity: Yes     Partners: Female       REVIEW OF SYSTEMS:  Constitutional: No fever.   Respiratory: See HPI.   Cardiovascular: See HPI.   All other systems reviewed and are negative except as noted in HOPI.    PHYSICAL EXAMINATION:     Vitals reviewed. BP 120/76 Comment: RTA  Pulse 79   Ht 1.905 m (6\' 3" )   Wt 104 kg (229 lb)   SpO2 97%   BMI 28.62 kg/m       Vitals Filed         05/26/2023  1350 05/26/2023  1351          BP: 122/74 120/76      Pulse: 79 --              Constitutional: He is oriented to person, place, and time.   Cardiovascular: Normal rate and rhythm without murmurs.  No JVD.  No peripheral  edema is noted.  Pulmonary/Chest: Lungs clear.    Abdominal: Soft. Nontender, non distended, no organomegaly.    ASSESSMENT:  ENCOUNTER DIAGNOSES     ICD-10-CM   1. Coronary artery disease involving native coronary artery of native heart without angina pectoris  I25.10   2. Hx of CABG  Z95.1   3. Paroxysmal atrial fibrillation (CMS HCC)  I48.0   4. History of DVT of lower extremity  Z86.718   5. Pulmonary embolism, unspecified chronicity, unspecified pulmonary embolism type, unspecified whether acute cor pulmonale present (CMS HCC)  I26.99   6. Primary hypertension  I10   7. Mixed hyperlipidemia  E78.2       PLAN:    1. CAD status post CABG-patient states while living in [place] he had a recent abnormal stress test.  Records are unavailable to me at this time.  Patient states he was started on metoprolol with some relief of chest discomfort.  Chest discomfort occurs randomly.  He has occasionally used nitroglycerin states he uses this approximately once every 2 weeks with relief of discomfort.  He remains on metoprolol and amlodipine for antianginal purposes.  Will again request records.  Patient to continue on dual antianginal therapy.  Patient encouraged to contact the office or go to the emergency department for chest pain, shortness of breath or palpitations.    2. Paroxysmal atrial fibrillation-patient reports improved palpitations since starting metoprolol.  He remains on Eliquis 5 mg b.i.d. for thromboembolic protection and denies bleeding complications.    3. History of deep vein thrombosis/PE-no recurrent episodes.    4. Hypertension-well controlled.  Continue current medical therapy with goal blood pressure less than 130/80.    5. Hyperlipidemia-continue Repatha for goal LDL less than 70.  Most recent LDL 37.    Follow-up in 6 months or sooner if needed.  All questions answered.  Patient voiced understanding and agrees with plan.    Orders Placed This Encounter    evolocumab (REPATHA SURECLICK) 140  mg/mL Subcutaneous Pen Injector       Return in about 6 months (around 11/26/2023) for [facility] . for continued management of chronic health issues including CAD, paroxysmal atrial fibrillation, hypertension and hyperlipidemia.    Electronically signed by     Morrie Sheldon, PA-C  05/26/2023, 07:57  Note reviewed and I agree with the above findings and assessment and plan by the nurse practitioner/PA.     Review of records from [place] [facility].  Patient underwent a myocardial perfusion scan on 01/18/2023.  Patient reached 7.6 Mets.  Exercise capacity was good.  Stress EKG revealed no ischemic changes.  Rest EKGs showed frequent PVCs.  Images showed a very small size, mild intensity, reversible perfusion defect in the apical septal myocardium.  This was associated with a low risk study.  Patient was started on metoprolol at that time.  Echocardiogram performed on 01/12/2023 revealed an LVEF of 52% mild mitral regurgitation, tricuspid regurgitation and pulmonic regurgitation.  Normal diastolic filling pattern. Morrie Sheldon, PA-C 05/31/2023 1441

## 2023-05-26 NOTE — Patient Instructions (Signed)
Continue same medications    Goal BP 100/60 to 130/80    Goal heart rate 60 to 100    Take nitroglycerin as needed for chest pain and go to the ED for chest pain unrelieved with 3 sublingual nitroglycerin    Call or go to the ED for chest pain, shortness of breath, or palpitations

## 2023-06-01 ENCOUNTER — Encounter (HOSPITAL_BASED_OUTPATIENT_CLINIC_OR_DEPARTMENT_OTHER): Payer: Self-pay

## 2023-06-19 ENCOUNTER — Other Ambulatory Visit (HOSPITAL_BASED_OUTPATIENT_CLINIC_OR_DEPARTMENT_OTHER): Payer: Self-pay | Admitting: Nurse Practitioner

## 2023-06-19 ENCOUNTER — Encounter (INDEPENDENT_AMBULATORY_CARE_PROVIDER_SITE_OTHER): Payer: Self-pay | Admitting: Family Medicine

## 2023-06-19 ENCOUNTER — Other Ambulatory Visit (INDEPENDENT_AMBULATORY_CARE_PROVIDER_SITE_OTHER): Payer: Self-pay | Admitting: Family Medicine

## 2023-06-19 DIAGNOSIS — I10 Essential (primary) hypertension: Secondary | ICD-10-CM

## 2023-06-19 DIAGNOSIS — E782 Mixed hyperlipidemia: Secondary | ICD-10-CM

## 2023-06-19 DIAGNOSIS — F32A Depression, unspecified: Secondary | ICD-10-CM

## 2023-06-19 DIAGNOSIS — K219 Gastro-esophageal reflux disease without esophagitis: Secondary | ICD-10-CM

## 2023-06-19 MED ORDER — FENOFIBRATE NANOCRYSTALLIZED 145 MG TABLET
145.0000 mg | ORAL_TABLET | Freq: Every morning | ORAL | 0 refills | Status: DC
Start: 2023-06-19 — End: 2023-08-02

## 2023-06-19 MED ORDER — AMLODIPINE 10 MG TABLET
10.0000 mg | ORAL_TABLET | Freq: Every day | ORAL | 0 refills | Status: DC
Start: 2023-06-19 — End: 2023-09-11

## 2023-06-19 MED ORDER — FAMOTIDINE 40 MG TABLET
40.0000 mg | ORAL_TABLET | Freq: Two times a day (BID) | ORAL | 0 refills | Status: DC
Start: 2023-06-19 — End: 2023-09-11

## 2023-06-19 MED ORDER — CITALOPRAM 10 MG TABLET
10.0000 mg | ORAL_TABLET | Freq: Every day | ORAL | 0 refills | Status: DC
Start: 2023-06-19 — End: 2024-02-09

## 2023-06-19 NOTE — Nursing Note (Signed)
Last scheduled appointment with you was 05/03/2023, Currently scheduled future appointment is 08/03/23     Patient has been seen within the last year: Yes.    Confirmed preferred pharmacy for this refill encounter is   Preferred Pharmacy       [phone] [place], [place] - [address] [facility] [address]    [address] [place] [address]    Phone: [phone] Fax: [phone]    Hours: Not open 24 hours        .   I updated med list and Rx or Rx's are ready to [facility] [address]   Lauryl Seyer, MA  06/19/2023, 16:19

## 2023-06-20 MED ORDER — NITROGLYCERIN 0.4 MG SUBLINGUAL TABLET
0.4000 mg | SUBLINGUAL_TABLET | SUBLINGUAL | 3 refills | Status: AC | PRN
Start: 2023-06-20 — End: 2024-10-02

## 2023-06-21 ENCOUNTER — Other Ambulatory Visit (INDEPENDENT_AMBULATORY_CARE_PROVIDER_SITE_OTHER): Payer: Self-pay | Admitting: Family Medicine

## 2023-06-21 DIAGNOSIS — I10 Essential (primary) hypertension: Secondary | ICD-10-CM

## 2023-06-21 DIAGNOSIS — N1831 Chronic kidney disease, stage 3a (CMS HCC): Secondary | ICD-10-CM

## 2023-06-21 DIAGNOSIS — I480 Paroxysmal atrial fibrillation: Secondary | ICD-10-CM

## 2023-06-21 MED ORDER — APIXABAN 5 MG TABLET
5.0000 mg | ORAL_TABLET | Freq: Two times a day (BID) | ORAL | 1 refills | Status: DC
Start: 2023-06-21 — End: 2024-02-26

## 2023-06-21 MED ORDER — METOPROLOL SUCCINATE ER 25 MG TABLET,EXTENDED RELEASE 24 HR
25.0000 mg | ORAL_TABLET | Freq: Every day | ORAL | 1 refills | Status: DC
Start: 2023-06-21 — End: 2024-01-25

## 2023-06-21 MED ORDER — LOSARTAN 25 MG TABLET
25.0000 mg | ORAL_TABLET | Freq: Every day | ORAL | 1 refills | Status: DC
Start: 2023-06-21 — End: 2024-01-09

## 2023-06-21 MED ORDER — METFORMIN 500 MG TABLET
500.0000 mg | ORAL_TABLET | Freq: Two times a day (BID) | ORAL | 1 refills | Status: DC
Start: 2023-06-21 — End: 2023-09-28

## 2023-06-21 MED ORDER — DAPAGLIFLOZIN PROPANEDIOL 10 MG TABLET
10.0000 mg | ORAL_TABLET | Freq: Every day | ORAL | 1 refills | Status: DC
Start: 2023-06-21 — End: 2023-12-15

## 2023-06-21 NOTE — Telephone Encounter (Addendum)
I updated med list and Rx [facility] are [facility] Comments added by RLM, CMA  on 06/21/23 at 13:07.      My chart message to the pt. Comments added by RLM, CMA  on 06/21/23 at 13:10.

## 2023-06-21 NOTE — Telephone Encounter (Addendum)
My Chart Message Conversation between, Pt, Dr, and my self:       Sleep Study  (Newest Message First)  View All Conversations on this Encounter  Lacy Duverney, MD  You33 minutes ago (12:25 PM)       Yes we can send in the refills etc  '     You  Lacy Duverney, MD22 hours ago (2:10 PM)     RM  Are you Okay with all these refills?    I can place them and then send to you to send in .    Thanks     Dustin Webster  You23 hours ago (1:33 PM)       Not sure what happened with the other message but below are the ones the system would not allow me to request:  1) Eliquis  10 mg once daily  2) Losartan 25 mg once daily  3) Metformin   500 mg  2x daily  4) metoprolol succinate  25 mg 24 hr tablet.   5) Farxiga 10 mg 1x daily     Thank you       You  Dustin Webster [id number] hours ago (1:25 PM)     RM  [name],     This is the all the medications that we were notified that were requested:       Dustin Webster   to P [facility]          06/19/23  3:21 PM  Refills have been requested for the following medications:         amLODIPine (NORVASC) 10 mg Oral Tablet [[name]]         famotidine (PEPCID) 40 mg Oral Tablet [[name]]         citalopram (CELEXA) 10 mg Oral Tablet [[name]]         fenofibrate nanocrystallized (TRICOR) 145 mg Oral Tablet [[name]]     So i apologize but that is all that was on that message.    If you need other medications refilled  Please send me the list of what you need.   We need to have the Name of medication, strength, and how you take it daily.  Along with what pharmacy to send rx to and we will get them taken care of for you.     THANK YOU       Dustin Webster  You23 hours ago (1:04 PM)       When I requested refills on medications yesterday. The system would not allow me to request several that been precribed by my [place] doctors including metformin. I put a note in the request that I also need all the ones it would not allow me to request set up.  I am  currently out of Metformin. Please review that message and get those medications set  up.  THANK YOU

## 2023-07-07 ENCOUNTER — Other Ambulatory Visit (HOSPITAL_BASED_OUTPATIENT_CLINIC_OR_DEPARTMENT_OTHER): Payer: Self-pay | Admitting: Cardiovascular Disease

## 2023-07-07 ENCOUNTER — Ambulatory Visit (HOSPITAL_BASED_OUTPATIENT_CLINIC_OR_DEPARTMENT_OTHER): Payer: Self-pay

## 2023-07-07 MED ORDER — REPATHA SURECLICK 140 MG/ML SUBCUTANEOUS PEN INJECTOR
140.0000 mg | PEN_INJECTOR | SUBCUTANEOUS | 3 refills | Status: DC
Start: 2023-07-07 — End: 2024-06-07

## 2023-07-07 NOTE — Telephone Encounter (Addendum)
[facility]atha approved. [facility] [address] to make sure medication went through. Pharmacist states did not trigger a denial now; however, they will need to order med due to it currently being out of stock. Update given to pt. Pt v/u. Encouraged pt to contact office with any further questions or concerns. Pt v/u. Candace Gallus Male Minish, RN      PA initiated through covermymeds for repatha. Your information has been submitted to [employer]. [employer] will review the request and will issue a decision, typically within 1-3 days from your submission. Abbie Sons, RN

## 2023-07-24 ENCOUNTER — Ambulatory Visit (INDEPENDENT_AMBULATORY_CARE_PROVIDER_SITE_OTHER): Payer: Medicare Other | Admitting: Internal Medicine

## 2023-07-24 ENCOUNTER — Other Ambulatory Visit: Payer: Medicare Other | Attending: Family Medicine

## 2023-07-24 ENCOUNTER — Other Ambulatory Visit (HOSPITAL_BASED_OUTPATIENT_CLINIC_OR_DEPARTMENT_OTHER): Payer: Medicare Other

## 2023-07-24 ENCOUNTER — Encounter (INDEPENDENT_AMBULATORY_CARE_PROVIDER_SITE_OTHER): Payer: Self-pay | Admitting: Internal Medicine

## 2023-07-24 ENCOUNTER — Other Ambulatory Visit: Payer: Self-pay

## 2023-07-24 VITALS — BP 115/81 | HR 82 | Ht 75.0 in | Wt 232.2 lb

## 2023-07-24 DIAGNOSIS — I129 Hypertensive chronic kidney disease with stage 1 through stage 4 chronic kidney disease, or unspecified chronic kidney disease: Secondary | ICD-10-CM

## 2023-07-24 DIAGNOSIS — E611 Iron deficiency: Secondary | ICD-10-CM

## 2023-07-24 DIAGNOSIS — E1122 Type 2 diabetes mellitus with diabetic chronic kidney disease: Secondary | ICD-10-CM

## 2023-07-24 DIAGNOSIS — N1831 Chronic kidney disease, stage 3a (CMS HCC): Secondary | ICD-10-CM

## 2023-07-24 DIAGNOSIS — N1832 Chronic kidney disease, stage 3b (CMS HCC): Secondary | ICD-10-CM

## 2023-07-24 LAB — URINALYSIS, MACROSCOPIC
BILIRUBIN: NEGATIVE mg/dL
BILIRUBIN: NOT DETECTED mg/dL
BLOOD: NEGATIVE mg/dL
BLOOD: NOT DETECTED mg/dL
KETONES: NEGATIVE mg/dL
KETONES: NOT DETECTED mg/dL
LEUKOCYTES: NEGATIVE WBCs/uL
LEUKOCYTES: NOT DETECTED WBCs/uL
NITRITE: NEGATIVE
NITRITE: NOT DETECTED
PH: 6.5 (ref 5.0–8.0)
PH: 5.5 (ref 5.0–?)
PROTEIN: NEGATIVE mg/dL
PROTEIN: NOT DETECTED mg/dL
SPECIFIC GRAVITY: 1.015 (ref 1.005–1.030)
SPECIFIC GRAVITY: 1.016 (ref 1.005–?)
UROBILINOGEN: 0.2 mg/dL
UROBILINOGEN: NOT DETECTED mg/dL

## 2023-07-24 LAB — CBC WITH DIFF
BASOPHIL #: <0.1 10*3/uL (ref <=0.20)
BASOPHIL %: 1.7 %
EOSINOPHIL #: 0.28 10*3/uL (ref <=0.50)
EOSINOPHIL %: 5.4 %
HCT: 45.8 % (ref 38.9–52.0)
HGB: 14.8 g/dL (ref 13.4–17.5)
IMMATURE GRANULOCYTE #: <0.1 10*3/uL (ref <0.10)
IMMATURE GRANULOCYTE %: 1 % (ref 0.0–1.0)
LYMPHOCYTE #: 1.73 10*3/uL (ref 1.00–4.80)
LYMPHOCYTE %: 33.5 %
MCH: 28.1 pg (ref 26.0–32.0)
MCHC: 32.3 g/dL (ref 31.0–35.5)
MCV: 87.1 fL (ref 78.0–100.0)
MONOCYTE #: 0.54 10*3/uL (ref 0.20–1.10)
MONOCYTE %: 10.5 %
MPV: 11.9 fL (ref 8.7–12.5)
NEUTROPHIL #: 2.47 10*3/uL (ref 1.50–7.70)
NEUTROPHIL %: 47.9 %
PLATELETS: 223 10*3/uL (ref 150–400)
RBC: 5.26 10*6/uL (ref 4.50–6.10)
RDW-CV: 14.5 % (ref 11.5–15.5)
WBC: 5.2 10*3/uL (ref 3.7–11.0)

## 2023-07-24 LAB — COMPREHENSIVE METABOLIC PANEL, NON-FASTING
ALBUMIN: 4.8 g/dL (ref 3.5–5.0)
ALKALINE PHOSPHATASE: 43 U/L (ref 38–126)
ALT (SGPT): 40 U/L (ref <=50)
ANION GAP: 6 mmol/L
AST (SGOT): 37 U/L (ref 17–59)
BILIRUBIN TOTAL: 0.4 mg/dL (ref 0.2–1.3)
BUN/CREA RATIO: 17
BUN: 27 mg/dL — ABNORMAL HIGH (ref 9–20)
CALCIUM: 10 mg/dL (ref 8.4–10.2)
CHLORIDE: 105 mmol/L (ref 98–107)
CO2 TOTAL: 29 mmol/L (ref 22–30)
CREATININE: 1.61 mg/dL — ABNORMAL HIGH (ref 0.66–1.25)
ESTIMATED GFR: 46 mL/min/{1.73_m2} — ABNORMAL LOW (ref >=60)
GLUCOSE: 172 mg/dL — ABNORMAL HIGH (ref 74–106)
POTASSIUM: 4.4 mmol/L (ref 3.5–5.1)
PROTEIN TOTAL: 7.5 g/dL (ref 6.3–8.2)
SODIUM: 140 mmol/L (ref 137–145)

## 2023-07-24 LAB — POC URINALYSIS (RESULTS)
BILIRUBIN: NEGATIVE mg/dL
BLOOD: NEGATIVE mg/dL
KETONES: NEGATIVE mg/dL
LEUKOCYTES: NEGATIVE WBCs/uL
NITRITE: NEGATIVE
PH: 6 (ref <8.0)
PROTEIN: NEGATIVE mg/dL
SPECIFIC GRAVITY: 1.015 (ref 1.005–1.030)
UROBILINOGEN: 0.2 mg/dL

## 2023-07-24 LAB — PHOSPHORUS: PHOSPHORUS: 3.3 mg/dL (ref 2.5–4.5)

## 2023-07-24 LAB — IRON TRANSFERRIN AND TIBC
IRON (TRANSFERRIN) SATURATION: 23 % (ref 20–55)
IRON: 106 ug/dL (ref 49–181)
TOTAL IRON BINDING CAPACITY: 458 ug/dL (ref 261–462)

## 2023-07-24 LAB — MICROALBUMIN/CREATININE RATIO, URINE, RANDOM
CREATININE RANDOM URINE: 61 mg/dL — ABNORMAL LOW (ref 100–200)
CREATININE RANDOM URINE: 44 mg/dL
MICROALBUMIN RANDOM URINE: <0.6 mg/dL (ref 0.0–1.7)
MICROALBUMIN RANDOM URINE: <0.5 mg/dL

## 2023-07-24 LAB — VITAMIN D 25 TOTAL: VITAMIN D 25, TOTAL: 52.3 ng/mL (ref 30.00–100.00)

## 2023-07-24 LAB — PARATHYROID HORMONE (PTH): PTH: 33.8 pg/mL (ref 7.5–53.5)

## 2023-07-24 LAB — FERRITIN: FERRITIN: 36 ng/mL (ref 18–464)

## 2023-07-24 LAB — MAGNESIUM: MAGNESIUM: 1.9 mg/dL (ref 1.6–2.3)

## 2023-07-24 NOTE — Ancillary Notes (Signed)
Dustin Webster [place]    Venipuncture performed in office on left arm antecubital vein, dry pressure dressing was applied to site and patient tolerated it well.  Specimen was centrifuged, aliquoted as needed and specimen was labeled and packaged for transport.    Jenene Slicker, PHLEBOTOMIST  07/24/2023, 15:11

## 2023-07-24 NOTE — Progress Notes (Signed)
Name: Dustin Webster MRN:  [id number]   Date: 07/24/2023 Age: 67 y.o.      NAME: Dustin Webster  AGE: 67 y.o.  MRN: [id number]  DATE:07/24/2023    REASON FOR CONSULT: CKD Stage 3 (Pt here today for new patient appointment. )       HISTORY OF PRESENTING ILLNESS:  Dustin Webster is a 67 y.o. y/o male presenting to establish care for CKD3b.    MEDICATION LIST:  Current Outpatient Medications   Medication Sig    amLODIPine (NORVASC) 10 mg Oral Tablet Take 1 Tablet (10 mg total) by mouth Once a day Indications: high blood pressure    apixaban (ELIQUIS) 5 mg Oral Tablet Take 1 Tablet (5 mg total) by mouth Twice daily Indications: blood clot in a deep vein of the extremities    Calcium Carbonate (OS-CAL) 650 mg calcium (1,625 mg) Oral Tablet Every evening     Cholecalciferol, Vitamin D3, 50 mcg (2,000 unit) Oral Capsule Take by mouth    citalopram (CELEXA) 10 mg Oral Tablet Take 1 Tablet (10 mg total) by mouth Once a day    dapagliflozin propanediol (FARXIGA) 10 mg Oral Tablet Take 1 Tablet (10 mg total) by mouth Once a day Indications: type 2 diabetes mellitus    evolocumab (REPATHA SURECLICK) 140 mg/mL Subcutaneous Pen Injector Inject 1 mL (140 mg total) under the skin Every 14 days    famotidine (PEPCID) 40 mg Oral Tablet Take 1 Tablet (40 mg total) by mouth Twice daily Indications: gastroesophageal reflux disease    fenofibrate nanocrystallized (TRICOR) 145 mg Oral Tablet Take 1 Tablet (145 mg total) by mouth Every morning before breakfast Indications: excessive fat in the blood    fluocinonide (LIDEX) 0.05 % Cream Apply topically Twice daily    folic acid (FOLVITE) 1 mg Oral Tablet Take 1 Tablet (1 mg total) by mouth Once a day    hydrocortisone 2.5 % Apply externally cream with perineal applicator Insert into the rectum Twice per day as needed    KRILL OIL ORAL Take by mouth    losartan (COZAAR) 25 mg Oral Tablet Take 1 Tablet (25 mg total) by mouth Once a day Indications: high blood pressure    LUTEIN ORAL Take by mouth     melatonin 1 mg Oral Tablet As directed    metFORMIN (GLUCOPHAGE) 500 mg Oral Tablet Take 1 Tablet (500 mg total) by mouth Twice daily with food Indications: type 2 diabetes mellitus    metoprolol succinate (TOPROL-XL) 25 mg Oral Tablet Sustained Release 24 hr Take 1 Tablet (25 mg total) by mouth Once a day Indications: high blood pressure    multivit-min/ferrous fumarate (MULTI VITAMIN ORAL) Take 1 Tablet by mouth Once a day    nitroGLYCERIN (NITROSTAT) 0.4 mg Sublingual Tablet, Sublingual Place 1 Tablet (0.4 mg total) under the tongue Every 5 minutes as needed for Chest pain for 3 doses over 15 minutes    sildenafiL, pulm.hypertension, (REVATIO) 20 mg Tablet 2 to 5 tabs po q day prn    silodosin (RAPAFLO) 8 mg Oral Capsule Take 1 Capsule (8 mg total) by mouth Once a day       ALLERGIES:  Allergies   Allergen Reactions    Gluten      Celiac disease    Statin [Atorvastatin] Myalgia     All statin medications       PAST MEDICAL HISTORY:  Past Medical History:   Diagnosis Date    Anemia  Atrial fibrillation (CMS HCC)     Blockage of coronary artery of heart (CMS HCC)     BPH associated with nocturia     CAD (coronary artery disease)     Chronic depression 05/23/2018    CKD (chronic kidney disease)     COVID-19 08/03/2020    Diet-controlled diabetes mellitus (CMS HCC) 05/23/2018    DVT (deep venous thrombosis) (CMS HCC)     Gastroesophageal reflux disease without esophagitis 05/23/2018    Insomnia 05/23/2018    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    Pericardial effusion     Pulmonary emboli (CMS HCC)     Sleep apnea            PAST SURGICAL HISTORY:  Past Surgical History:   Procedure Laterality Date    HX CHOLECYSTECTOMY      HX CORONARY ARTERY BYPASS GRAFT      HX HEART SURGERY  12/2015    HX HERNIA REPAIR      Inguinal     HX LIPOMA RESECTION      Chest    OTHER SURGICAL HISTORY Left 01/2016    Evacuation of hematoma -Left Atrium  at [facility]     SKIN CANCER EXCISION  06/21/2013    melanoma/ear/[facility]     TRANSURETHRAL MICROWAVE THERAPY  05/24/2018    Dr Evert Kohl           FAMILY HISTORY:  Family Medical History:       Problem Relation (Age of Onset)    Emphysema Father    Heart Attack General Family Hx    Heart Disease Mother    Hypertension (High Blood Pressure) Mother    No Known Problems Other    Stroke Mother              SOCIAL HISTORY:  Social History     Tobacco Use    Smoking status: Never    Smokeless tobacco: Never   Substance Use Topics    Alcohol use: Not Currently     Comment: rare        REVIEW OF SYSTEMS:  Review of Systems   Constitutional: Negative.    HENT: Negative.     Eyes: Negative.    Respiratory: Negative.     Cardiovascular: Negative.    Gastrointestinal:  Positive for diarrhea.   Genitourinary: Negative.    Musculoskeletal: Negative.    Skin: Negative.    Neurological: Negative.    Endo/Heme/Allergies: Negative.    Psychiatric/Behavioral: Negative.           PHYSICAL EXAM:     Vitals:    07/24/23 1403   BP: 115/81   Pulse: 82   SpO2: 96%   Weight: 105 kg (232 lb 3.2 oz)   Height: 1.905 m (6\' 3" )   BMI: 29.08     # GENERAL: Alert, no acyte distress  # EYE: PERRLA, EOMI  # HEENT: Normocephalic, moist oral mucosa  # CARDIOVASCULAR: S1, S2 normal, no murmurs  # RESPIRATORY: Chest clear, no rales  # GASTROINTESTINAL: Soft, non tender  # GENITOURINARY: Deferred  # SKIN: Warm, dry  # PSYCHIATRIC: Cooperative, appropriate mood and affect    LABS:  There are no exam notes on file for this visit.    BASIC METABOLIC PANEL  Lab Results   Component Value Date    SODIUM 140 05/03/2023    POTASSIUM 4.7 05/03/2023    CHLORIDE 104 05/03/2023    CO2 23 05/03/2023  ANIONGAP 13 05/03/2023    BUN 26 (H) 05/03/2023    CREATININE 1.56 (H) 05/03/2023    BUNCRRATIO 17 05/03/2023    GFR 48 (L) 05/03/2023    CALCIUM 10.1 05/03/2023    GLUCOSE Not Detected 07/21/2021    GLUCOSENF 150 (H) 05/03/2023          CBC  Diff   Lab Results   Component Value Date/Time    WBC 5.5 05/03/2023 08:58 AM    HGB 15.3 05/03/2023  08:58 AM    HCT 47.8 05/03/2023 08:58 AM    PLTCNT 266 05/03/2023 08:58 AM    RBC 5.36 05/03/2023 08:58 AM    MCV 89.2 05/03/2023 08:58 AM    MCHC 32.0 05/03/2023 08:58 AM    MCH 28.5 05/03/2023 08:58 AM    RDW 14.0 06/10/2021 11:16 AM    MPV 12.1 05/03/2023 08:58 AM    Lab Results   Component Value Date/Time    PMNS 43.2 05/03/2023 08:58 AM    LYMPHOCYTES 34 06/10/2021 11:16 AM    EOSINOPHIL 6 06/10/2021 11:16 AM    MONOCYTES 10.5 05/03/2023 08:58 AM    BASOPHILS 2.0 05/03/2023 08:58 AM    BASOPHILS 0.11 05/03/2023 08:58 AM    PMNABS 2.36 05/03/2023 08:58 AM    LYMPHSABS 2.08 05/03/2023 08:58 AM    EOSABS 0.32 05/03/2023 08:58 AM    MONOSABS 0.57 05/03/2023 08:58 AM    BASABS 0.04 01/14/2020 12:08 PM            No results found for: "IPTH"    No results found for: "VITD25HYDRO", "VITD125DIHY"    FERRITIN   Date Value Ref Range Status   05/03/2023 44 18 - 464 ng/mL Final     IRON (TRANSFERRIN) SATURATION   Date Value Ref Range Status   05/03/2023 17 (L) 20 - 55 % Final     IRON   Date Value Ref Range Status   05/03/2023 81 49 - 181 ug/dL Final       PROTEIN   Date Value Ref Range Status   07/21/2021 Not Detected Not Detected mg/dL Final   38/75/6433 Not Detected Not Detected mg/dL Final   29/51/8841 Not Detected Not Detected mg/dL Final       IMAGING:    No results found for this or any previous visit (from the past 660630160 hour(s)).    Recent Results (from the past 109323557 hour(s))   CT ABDOMEN PELVIS W IV CONTRAST    Collection Time: 03/27/22 12:29 AM    Narrative    CLINICAL DATA:  Polytrauma, blunt 322025.  Fall.    EXAM:  CT CHEST, ABDOMEN, AND PELVIS WITH CONTRAST    TECHNIQUE:  Multidetector CT imaging of the chest, abdomen and pelvis was  performed following the standard protocol during bolus  administration of intravenous contrast.    RADIATION DOSE REDUCTION: This exam was performed according to the  departmental dose-optimization program which includes automated  exposure control, adjustment of the mA  and/or kV according to  patient size and/or use of iterative reconstruction technique.    CONTRAST:  80mL OMNIPAQUE IOHEXOL 350 MG/ML SOLN    COMPARISON:  03/21/2022    FINDINGS:  CT CHEST FINDINGS    Cardiovascular: Prior CABG. Heart is normal size. Aorta is normal  caliber.    Mediastinum/Nodes: No mediastinal, hilar, or axillary adenopathy.  Trachea and esophagus are unremarkable. Thyroid unremarkable.    Lungs/Pleura: Dependent atelectasis or scarring posteriorly in the  lower lobes. No  effusions or pneumothorax. No confluent airspace  opacities.    Musculoskeletal: Chest wall soft tissues are unremarkable. No acute  bony abnormality.    CT ABDOMEN PELVIS FINDINGS    Hepatobiliary: No hepatic injury or perihepatic hematoma. Diffuse  low-density throughout the liver compatible with fatty infiltration.  Prior cholecystectomy.    Pancreas: No focal abnormality or ductal dilatation.    Spleen: No splenic injury or perisplenic hematoma.    Adrenals/Urinary Tract: No adrenal hemorrhage or renal injury  identified. Bladder is unremarkable.    Stomach/Bowel: Normal appendix. Stomach, large and small bowel  grossly unremarkable.    Vascular/Lymphatic: Scattered aortic atherosclerosis. No evidence of  aneurysm or adenopathy.    Reproductive: No visible focal abnormality.    Other: No free fluid or free air.    Musculoskeletal: No acute bony abnormality.    IMPRESSION:  No acute findings or evidence of significant traumatic injury in the  chest, abdomen or pelvis.    Prior CABG.  Aortic atherosclerosis.    Hepatic steatosis.    These results were called by telephone at the time of interpretation  on 03/27/2022 at 12:31 am to provider Dr. Violeta Gelinas, who  verbally acknowledged these results.      Electronically Signed    By: Charlett Nose M.D.    On: 03/27/2022 00:36        No results found for this or any previous visit (from the past [id number] hour(s)).      ASSESSMENT:    Problem List Items Addressed This Visit           Nephrology    Stage 3 chronic kidney disease (CMS HCC)    Relevant Orders    CBC/DIFF    COMPREHENSIVE METABOLIC PANEL, NON-FASTING    MAGNESIUM    PHOSPHORUS    IRON TRANSFERRIN AND TIBC    FERRITIN    IRON    URINALYSIS, MACROSCOPIC    MICROALBUMIN/CREATININE RATIO, URINE, RANDOM    PARATHYROID HORMONE (PTH)    VITAMIN D 25 TOTAL    CBC/DIFF    COMPREHENSIVE METABOLIC PANEL, NON-FASTING    MAGNESIUM    PHOSPHORUS    IRON TRANSFERRIN AND TIBC    FERRITIN    IRON    URINALYSIS, MACROSCOPIC    MICROALBUMIN/CREATININE RATIO, URINE, RANDOM    PARATHYROID HORMONE (PTH)    VITAMIN D 25 TOTAL     Other Visit Diagnoses       Iron deficiency    -  Primary    Relevant Orders    CBC/DIFF    COMPREHENSIVE METABOLIC PANEL, NON-FASTING    MAGNESIUM    PHOSPHORUS    IRON TRANSFERRIN AND TIBC    FERRITIN    IRON    URINALYSIS, MACROSCOPIC    MICROALBUMIN/CREATININE RATIO, URINE, RANDOM    PARATHYROID HORMONE (PTH)    VITAMIN D 25 TOTAL    CBC/DIFF    COMPREHENSIVE METABOLIC PANEL, NON-FASTING    MAGNESIUM    PHOSPHORUS    IRON TRANSFERRIN AND TIBC    FERRITIN    IRON    URINALYSIS, MACROSCOPIC    MICROALBUMIN/CREATININE RATIO, URINE, RANDOM    PARATHYROID HORMONE (PTH)    VITAMIN D 25 TOTAL              1. Chronic Kidney Disease 3b:  The kidney disease is likely secondary to T2DM. His most recent A1C is 7.6, shows reasonable control. Is metformin 500 mg bid, farxiga 10 mg daily. Is on  Cozaar 25 mg and amlodipine 10 mg for blood pressure control.    2.  Stable Electrolytes    3. Stable Acid Base Status    4. Hypertension:  Well controlled on current blood pressure regimen.    5. Anemia:  Hemoglobin 15.3, HCR 47.8. WNL    6. Metabolic Bone Disease:  Stable metabolic bone profile. Patient has a history of celiac disease and is on calcium replacement        PLAN:  1.Continue current medication regiment for CKD3b. Obtain blood work including CBC, CMP, UA, Urine Albumin creatinine ratio, PTH, Mag, Phos, Vitamin D levels today and 3  months later. Obtain renal ultrasound.  2. Continue to monitor labs for electrolyte and acid base status.   3. Continue current antihypertensive regimen  4. Stable metabolic bone disease. Continue to monitor.  5. Follow up in 3 months.  6. Patient encouraged to drink 5--50 ounce of water daily. Encouraged to take low salt, low red meat diet.       FOLLOW UP: Return in about 3 months (around 10/23/2023).     Macaulay Reicher Louis Meckel, MD PGY-3 07/24/2023, 15:05      I have independently seen and examined the patient on the date of service.  I have discussed the history, presentation, and plan of care in detail with the resident.  I have reviewed the document above for medical content and agree as documented.  Not all grammatical errors, misspellings, transcription misinterpretations, and punctuation errors have been corrected.     Exam:  Cardiovascular: Agree as documented  Respiratory: Agree as documented  Leonard Downing MD

## 2023-07-24 NOTE — Patient Instructions (Addendum)
Discharge Instructions: Eating a Low-Potassium Diet  Your healthcare provider has prescribed a low-potassium diet for you. This kind of diet is advised for people who have certain kidney problems. Potassium is needed for muscle function. But too much potassium is a health risk. Potassium is found in many foods. Read below to find out how to change your diet.   Foods to limit  Some foods are high in potassium. Limit your daily intake of the foods in the list below.   Fruits: apricots (canned and fresh), bananas, cantaloupe, honeydew melon, kiwi, nectarines, pomegranates, oranges, orange juice, pears, dried fruits (apricots, dates, figs, prunes), and prune juice  Vegetables: asparagus, avocado, artichoke, bamboo shoots, beets, brussels sprouts, cabbage, celery, chard, okra, potatoes (white and sweet), pumpkin, rutabaga, spinach (cooked), squash, tomato, tomato sauce, tomato juice, and vegetable juice cocktail  Legumes: black-eyed peas, chickpeas, lentils, lima beans, navy beans, red kidney beans, soybeans, and split peas  Nuts and seeds: almonds, [place] nuts, cashews, peanuts, peanut butter, pecans, pumpkin seeds, sunflower seeds, and walnuts  Breads and cereals: bran and whole-grain products  Dairy foods: milk, cheese, ice cream, yogurt  Animal protein: all forms of animal protein  Other: chocolate, cocoa, coconut milk, and molasses  Tips  Ask your healthcare provider how much potassium you are allowed each day. This will help you figure out serving sizes for your needs.  Check labels for potassium. It may be listed as potassium chloride.  Don't use salt substitutes. These often have potassium in them.  Cook frozen fruits and vegetables in water. Rinse and drain them well before eating.  Drain liquid from all canned fruits and vegetables. Rinse them before eating.  Reduce the potassium in potatoes. Peel them, slice thinly, and soak in water for at least 4 hours.  Reduce the potassium in green leafy vegetables. Soak  them in water for at least 4 hours.  Eat white rice and refined white flour products. These include white bread, pasta, and grits.     Follow-up  Make a follow-up appointment as advised by your healthcare provider.  When to call your healthcare provider  Call your healthcare provider right away if you have any of the following:  Tiredness (fatigue)  Shortness of breath  Chest pain  Slow, irregular heartbeat  Fainting  Dizziness  Lightheadedness  Confusion  Feeling like your heart is pounding (palpitations)  [employer]Well last reviewed this educational content on 03/15/2019   2000-2021 [employer]. All rights reserved. This information is not intended as a substitute for professional medical care. Always follow your healthcare professional's instructions.         THE LIST OF MEDICATIONS TO AVOID     Aspirin  Anacin, Ascriptin, Bayer, Bufferin, Ecotrin, Excedrin (not more than325 mg/day)   Choline and magnesium salicylates  CMT,Tricosal, Trilisate   Choline salicylate Arthropan   Celecoxib  Celebrex   Diclofenac potassium Cataflam   Diclofenac sodium  Voltaren, Voltaren XR   Diclofenac sodium with misoprostol  Arthrotec   Diflunisal Dolobid   Etodolac  Lodine, Lodine XL   Fenoprofen calcium Nalfon   Flurbiprofen Ansaid   Ibuprofen Advil, Motrin, Motrin IB, Nuprin   Indomethacin  Indocin, Indocin SR   Ketorolac Toradol   Ketoprofen Actron,Orudis, Orudis KT, Oruvail   Magnesium salicylate  Arthritab, Bayer Select, Doan's Pills, Magan, Mobidin, Mobogesic   Meclofenamate sodium Meclomen   Mefenamic acid Ponstel   Meloxicam  Mobic   Nabumetone Relafen   Naproxen  Naprosyn, Naprelan   Naproxen  sodium  Aleve, Anaprox   Oxaprozin  Daypro   Piroxicam Feldene   Rofecoxib Vioxx   Salsalate Amigesic, Anaflex 750, Disalcid, Marthritic, Mono-Gesic, Salflex, Salsitab   Sodium salicylate  various generics   Sulindac Clinoril   Tolmetin sodium  Tolectin   Valdecoxib Bextra    1. Drink 64 ounces of water in a day minimal .  Avoid sugary drinks like pop.  Low salt diet, < 2 gms daily.  2. Avoid NSAIDs, IV dye or over the counter herbal medications   3. Please check your BP at home and keep log prior to next visit if possible   4. Let the office know with any change in your medications or health status

## 2023-08-02 ENCOUNTER — Ambulatory Visit (INDEPENDENT_AMBULATORY_CARE_PROVIDER_SITE_OTHER): Payer: Self-pay | Admitting: Internal Medicine

## 2023-08-02 ENCOUNTER — Other Ambulatory Visit (INDEPENDENT_AMBULATORY_CARE_PROVIDER_SITE_OTHER): Payer: Self-pay | Admitting: Family Medicine

## 2023-08-02 DIAGNOSIS — E782 Mixed hyperlipidemia: Secondary | ICD-10-CM

## 2023-08-02 NOTE — Telephone Encounter (Signed)
Last scheduled appointment with you was 05/03/2023.    Currently scheduled future appointment is 08/03/2023.    Lab Results   Component Value Date    CHOLESTEROL 137 05/03/2023    HDLCHOL 44 05/03/2023    LDLCHOL 37 05/03/2023    LDLCHOLDIR 91 05/07/2018    TRIG 279 (H) 05/03/2023        Rod Mae, MA  08/02/2023, 15:02

## 2023-08-03 ENCOUNTER — Ambulatory Visit: Payer: Medicare Other | Attending: Family Medicine | Admitting: Family Medicine

## 2023-08-03 ENCOUNTER — Other Ambulatory Visit (INDEPENDENT_AMBULATORY_CARE_PROVIDER_SITE_OTHER): Payer: Medicare Other

## 2023-08-03 ENCOUNTER — Encounter (INDEPENDENT_AMBULATORY_CARE_PROVIDER_SITE_OTHER): Payer: Self-pay | Admitting: Family Medicine

## 2023-08-03 ENCOUNTER — Other Ambulatory Visit: Payer: Self-pay

## 2023-08-03 VITALS — BP 110/74 | HR 70 | Temp 97.0°F | Resp 15 | Ht 75.0 in | Wt 232.0 lb

## 2023-08-03 DIAGNOSIS — E119 Type 2 diabetes mellitus without complications: Secondary | ICD-10-CM

## 2023-08-03 DIAGNOSIS — Z7984 Long term (current) use of oral hypoglycemic drugs: Secondary | ICD-10-CM

## 2023-08-03 DIAGNOSIS — N1831 Chronic kidney disease, stage 3a: Secondary | ICD-10-CM | POA: Insufficient documentation

## 2023-08-03 DIAGNOSIS — E1122 Type 2 diabetes mellitus with diabetic chronic kidney disease: Secondary | ICD-10-CM | POA: Insufficient documentation

## 2023-08-03 DIAGNOSIS — Z23 Encounter for immunization: Secondary | ICD-10-CM | POA: Insufficient documentation

## 2023-08-03 DIAGNOSIS — R197 Diarrhea, unspecified: Secondary | ICD-10-CM | POA: Insufficient documentation

## 2023-08-03 DIAGNOSIS — E782 Mixed hyperlipidemia: Secondary | ICD-10-CM | POA: Insufficient documentation

## 2023-08-03 DIAGNOSIS — I25110 Atherosclerotic heart disease of native coronary artery with unstable angina pectoris: Secondary | ICD-10-CM

## 2023-08-03 DIAGNOSIS — Z79899 Other long term (current) drug therapy: Secondary | ICD-10-CM | POA: Insufficient documentation

## 2023-08-03 DIAGNOSIS — K8681 Exocrine pancreatic insufficiency: Secondary | ICD-10-CM | POA: Insufficient documentation

## 2023-08-03 DIAGNOSIS — I129 Hypertensive chronic kidney disease with stage 1 through stage 4 chronic kidney disease, or unspecified chronic kidney disease: Secondary | ICD-10-CM | POA: Insufficient documentation

## 2023-08-03 DIAGNOSIS — I10 Essential (primary) hypertension: Secondary | ICD-10-CM

## 2023-08-03 DIAGNOSIS — K900 Celiac disease: Secondary | ICD-10-CM | POA: Insufficient documentation

## 2023-08-03 DIAGNOSIS — D649 Anemia, unspecified: Secondary | ICD-10-CM | POA: Insufficient documentation

## 2023-08-03 DIAGNOSIS — I2510 Atherosclerotic heart disease of native coronary artery without angina pectoris: Secondary | ICD-10-CM | POA: Insufficient documentation

## 2023-08-03 NOTE — Nursing Note (Signed)
08/03/23 4401   Domestic Violence   Because we are aware of abuse and domestic violence today, we ask all patients: Are you being hurt, hit, or frightened by anyone at your home or in your life?  N   Basic Needs   Do you have any basic needs within your home that are not being met? (such as Food, Shelter, [employer]er, Tranportation, paying for bills and/or medications) N     PHQ Questionnaire  Little interest or pleasure in doing things.: Not at all  Feeling down, depressed, or hopeless: Several Days  PHQ 2 Total: 1  Trouble falling or staying asleep, or sleeping too much.: Not at all  Feeling tired or having little energy: Not at all  Poor appetite or overeating: Not at all  Feeling bad about yourself/ that you are a failure in the past 2 weeks?: Not at all  Trouble concentrating on things in the past 2 weeks?: Not at all  Moving/Speaking slowly or being fidgety or restless  in the past 2 weeks?: Not at all  Thoughts that you would be better off DEAD, or of hurting yourself in some way.: Not at all  If you checked off any problems, how difficult have these problems made it for you to do your work, take care of things at home, or get along with other people?: Not difficult at all  PHQ 9 Total: 1  Interpretation of Total Score: 0-4 No depression

## 2023-08-03 NOTE — Nursing Note (Signed)
Patient received vaccine in clinic.  Tolerated it well, given VIS sheet and was discharged to home.  Immunization administered       Name Date Dose VIS Date Route    Influenza Vaccine, 65+ 08/03/2023 0.5 mL 06/19/2020 Intramuscular    Site: Left deltoid    Given By: Rod Mae, MA    Manufacturer: [employer]    Lot: [id number]    NDC: [id number]            Rod Mae, MA  08/03/2023, 10:03

## 2023-08-04 ENCOUNTER — Other Ambulatory Visit (INDEPENDENT_AMBULATORY_CARE_PROVIDER_SITE_OTHER): Payer: Medicare Other

## 2023-08-04 ENCOUNTER — Inpatient Hospital Stay
Admission: RE | Admit: 2023-08-04 | Discharge: 2023-08-04 | Disposition: A | Discharge status: Home / Self Care | Payer: Medicare Other | Source: Ambulatory Visit | Attending: Internal Medicine

## 2023-08-04 DIAGNOSIS — E782 Mixed hyperlipidemia: Secondary | ICD-10-CM | POA: Insufficient documentation

## 2023-08-04 DIAGNOSIS — N1831 Chronic kidney disease, stage 3a (CMS HCC): Secondary | ICD-10-CM

## 2023-08-04 DIAGNOSIS — E1122 Type 2 diabetes mellitus with diabetic chronic kidney disease: Secondary | ICD-10-CM | POA: Insufficient documentation

## 2023-08-04 LAB — HGA1C (HEMOGLOBIN A1C WITH EST AVG GLUCOSE)
ESTIMATED AVERAGE GLUCOSE: 160 mg/dL
HEMOGLOBIN A1C: 7.2 % — ABNORMAL HIGH (ref <5.7)

## 2023-08-04 LAB — LIPID PANEL
CHOLESTEROL: 109 mg/dL (ref 0–199)
HDL CHOL: 39 mg/dL — ABNORMAL LOW (ref 40–60)
LDL CALC: 30 mg/dL (ref 0–130)
TRIGLYCERIDES: 199 mg/dL — ABNORMAL HIGH (ref 0–149)

## 2023-08-04 LAB — THYROID STIMULATING HORMONE (SENSITIVE TSH): TSH: 2.23 u[IU]/mL (ref 0.465–4.680)

## 2023-08-04 NOTE — Progress Notes (Signed)
[facility]  [address] [place] [address]  Operated by [facility]  Progress Note    Name: Dustin Webster MRN:  [id number]   Date: 08/03/2023 DOB:  08-May-1956 (67 y.o.)             Reason for Visit: Follow Up 3 Months, Hypertension, Hyperlipidemia, Diabetes, Diarrhea (X 10 years, reports is getting worse. Pt has seen GI but nothing seems to help. ), and Blood Work (Dr. Janie Morning ordered blood work and the iron was low. Pt would like to discuss if he should be taking iron)    History of Present Illness  Dustin Webster is a 67 y.o. male who is being seen today for fu    Problem   Diet-Controlled Diabetes Mellitus (Cms Hcc)    Has been on metformin - did initially have diarrhea worseing but over last few years hasa not seemed to bother that knows of.  Not checking sugars regularly thinks will be between 160-170.  Follows with nephrology and remains on losartan.  Has regular eye exams.        Anemia    Had recent labs.  Ferritin normal but lower end.  Has not been taking iron.  No active bleeding no melena.   Severe GI bleed a year or so ago but even with diarrhea no melena or bleeding no nausea or dyspepsia.  Remains on Eliquis and does better as with GI bleed was on coumadin with significantly elevated levels      Mixed Hyperlipidemia    Current Medication: repatha TriCor 145 mg daily   Side Effects of Medication: none  History of Statin Intolerance: yes  Diet: fairly healthy gluten free  Exercise: starting            Htn (Hypertension)    Blood Pressure Medications: Norvasc 10 mg daily, cozaar 25 mg daily Toprol XL 25 mg daily   Blood Pressure Measurements at home: normal   Concerning Symptoms: no chest pain or dyspnea no edema   Medication Side Effects: none  Diet: fairly healthy  gluten free   Exercise:starting   Specialist Care: cardiology           Cad (Coronary Artery Disease), Native Coronary Artery      Aspirin: Yes  Beta blockers: Yes  Statins: repatha due to  intolerance as well as Nurse, mental health with cardiology.  Has basically daily CP but was told where lesion is too risky to intervene and that area with basically insignificant damage concerns so treating medically.  Has gotten better with addition  B blocker       Celiac Disease    Known celiac and is quite careful about avoiding glutens etc but worsening diarrhea.  Had been on [place] but quit working has been off for 4 months no real change.  First BM in am is usually solid then will have 3-4 loose stools esp after eating rest of day.  No bleeding no weight loss no nausea or vomiting.  No incontinence.  Up to date with colonoscopy no changes in medication or other.  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    Pericardial effusion     Pulmonary emboli (CMS HCC)     Sleep apnea            Past Surgical History:   Procedure Laterality Date    HX CHOLECYSTECTOMY      HX CORONARY ARTERY BYPASS GRAFT      HX HEART SURGERY  12/2015    HX HERNIA REPAIR      Inguinal     HX LIPOMA RESECTION      Chest    OTHER SURGICAL HISTORY Left 01/2016    Evacuation of hematoma -Left Atrium  at [facility]     SKIN CANCER EXCISION  06/21/2013    melanoma/ear/[facility]    TRANSURETHRAL MICROWAVE THERAPY  05/24/2018    Dr Evert Kohl           Medications  amLODIPine (NORVASC) 10 mg Oral Tablet, Take 1 Tablet (10 mg total) by mouth Once a day Indications: high blood  pressure  apixaban (ELIQUIS) 5 mg Oral Tablet, Take 1 Tablet (5 mg total) by mouth Twice daily Indications: blood clot in a deep vein of the extremities  Calcium Carbonate (OS-CAL) 650 mg calcium (1,625 mg) Oral Tablet, Every evening   Cholecalciferol, Vitamin D3, 50 mcg (2,000 unit) Oral Capsule, Take by mouth  citalopram (CELEXA) 10 mg Oral Tablet, Take 1 Tablet (10 mg total) by mouth Once a day  dapagliflozin propanediol (FARXIGA) 10 mg Oral Tablet, Take 1 Tablet (10 mg total) by mouth Once a day Indications: type 2 diabetes mellitus  evolocumab (REPATHA SURECLICK) 140 mg/mL Subcutaneous Pen Injector, Inject 1 mL (140 mg total) under the skin Every 14 days  famotidine (PEPCID) 40 mg Oral Tablet, Take 1 Tablet (40 mg total) by mouth Twice daily Indications: gastroesophageal reflux disease  fenofibrate nanocrystallized (TRICOR) 145 mg Oral Tablet, TAKE 1 TABLET (145 MG TOTAL) BY MOUTH EVERY MORNING BEFORE BREAKFAST  fluocinonide (LIDEX) 0.05 % Cream, Apply topically Twice daily  folic acid (FOLVITE) 1 mg Oral Tablet, Take 1 Tablet (1 mg total) by mouth Once a day  hydrocortisone 2.5 % Apply externally cream with perineal applicator, Insert into the rectum Twice per day as needed  KRILL OIL ORAL, Take by mouth  losartan (COZAAR) 25 mg Oral Tablet, Take 1 Tablet (25 mg total) by mouth Once a day Indications: high blood pressure  LUTEIN ORAL, Take by mouth  melatonin 1 mg Oral Tablet, As directed  metFORMIN (GLUCOPHAGE) 500 mg Oral Tablet, Take 1 Tablet (500 mg total) by mouth Twice daily with food Indications: type 2 diabetes mellitus  metoprolol succinate (TOPROL-XL) 25 mg Oral Tablet Sustained Release 24 hr, Take 1 Tablet (25 mg total) by mouth Once a day Indications: high blood pressure  multivit-min/ferrous fumarate (MULTI VITAMIN ORAL), Take 1 Tablet by mouth Once a day  nitroGLYCERIN (NITROSTAT) 0.4 mg Sublingual Tablet, Sublingual, Place 1 Tablet (0.4 mg total) under the tongue Every 5 minutes as needed for  Chest pain for 3 doses over 15 minutes  sildenafiL, pulm.hypertension, (REVATIO) 20 mg Tablet, 2 to 5 tabs po q  day prn  silodosin (RAPAFLO) 8 mg Oral Capsule, Take 1 Capsule (8 mg total) by mouth Once a day    No facility-administered medications prior to visit.      Allergies   Allergen Reactions    Gluten      Celiac disease    Statin [Atorvastatin] Myalgia     All statin medications       Family Medical History:       Problem Relation (Age of Onset)    Emphysema Father    Heart Attack General Family Hx    Heart Disease Mother    Hypertension (High Blood Pressure) Mother    No Known Problems Other    Stroke Mother              Social History     Tobacco Use    Smoking status: Never    Smokeless tobacco: Never   Vaping Use    Vaping status: Never Used   Substance Use Topics    Alcohol use: Not Currently     Comment: rare    Drug use: Never       Review of Systems  Pertinent positives are listed in HPI    Physical Exam:  BP 110/74 (Site: Left Arm, Patient Position: Sitting, Cuff Size: Adult Small)   Pulse 70   Temp 36.1 C (97 F) (Temporal)   Resp 15   Ht 1.905 m (6\' 3" )   Wt 105 kg (232 lb)   SpO2 98%   BMI 29.00 kg/m       Physical Exam  Vitals and nursing note reviewed.   Constitutional:       General: He is not in acute distress.     Appearance: Normal appearance.   Neck:      Vascular: No carotid bruit.   Cardiovascular:      Rate and Rhythm: Normal rate and regular rhythm.   Pulmonary:      Effort: Pulmonary effort is normal.      Breath sounds: Normal breath sounds. No wheezing or rhonchi.   Abdominal:      General: Bowel sounds are normal. There is no distension.      Palpations: Abdomen is soft. There is no mass.      Tenderness: There is no abdominal tenderness.   Musculoskeletal:      Cervical back: Neck supple.      Right lower leg: No edema.      Left lower leg: No edema.   Lymphadenopathy:      Cervical: No cervical adenopathy.   Neurological:      Mental Status: He is alert and oriented to  person, place, and time.   Psychiatric:         Mood and Affect: Mood normal.                       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  Monitor labs but stop metformin in case contributing.  Will be restarting iron this may affect some.  Consider referral back to GI if does not improve             Endocrine    Diet-controlled diabetes mellitus (CMS HCC)     With worsening diarrhea as above stop metformin and await labs.  Based on results can add medication for control.  Start checking sugars more regularly as well continue monitoring nutrition             Hematologic/Lymphatic    Anemia     Restart iron tabs just one a day and monitor labs and may help with diarrhea a little as well.  Continue with nephrology as well           Other Visit Diagnoses       Need for influenza vaccination    -  Primary    Relevant Orders    Flu Vaccine, 65+,0.5 mL IM (Admin) (Completed)             Orders Placed This Encounter    Flu Vaccine, 65+,0.5 mL IM (Admin)    HGA1C (HEMOGLOBIN A1C WITH EST AVG GLUCOSE)    LIPID PANEL    THYROID STIMULATING HORMONE (SENSITIVE  TSH)      There are no discontinued medications.      Follow up: Return in about 4 months (around 12/03/2023), or if symptoms worsen or fail to improve.    Seek medical attention for new or worsening symptoms.    Patient has been seen in this clinic within the last 3 years.     Lacy Duverney, MD     This note was partially created using MModal Fluency Direct system (voice recognition software) and is inherently subject to errors including those of syntax and "sound-alike" substitutions which may escape proofreading.  In such instances, original meaning may be extrapolated by contextual derivation.

## 2023-08-04 NOTE — Assessment & Plan Note (Signed)
Continue current medication.

## 2023-08-04 NOTE — Assessment & Plan Note (Signed)
Continue with risk factor modification and follow with cardiology

## 2023-08-04 NOTE — Assessment & Plan Note (Signed)
Monitor labs and assure no gluten fillers in generic medication.  Monitor labs but stop metformin in case contributing.  Will be restarting iron this may affect some.  Consider referral back to GI if does not improve

## 2023-08-04 NOTE — Ancillary Notes (Signed)
Dustin Webster    Venipuncture performed in office on left arm antecubital vein, dry pressure dressing was applied to site and patient tolerated it well.  Specimen was centrifuged, aliquoted as needed and specimen was labeled and packaged for transport.    [name]  08/04/2023, 08:17

## 2023-08-04 NOTE — Assessment & Plan Note (Signed)
Restart iron tabs just one a day and monitor labs and may help with diarrhea a little as well.  Continue with nephrology as well

## 2023-08-04 NOTE — Assessment & Plan Note (Signed)
With worsening diarrhea as above stop metformin and await labs.  Based on results can add medication for control.  Start checking sugars more regularly as well continue monitoring nutrition

## 2023-08-04 NOTE — Assessment & Plan Note (Signed)
Continue with nutrition and medication monitor labs

## 2023-08-11 NOTE — Result Encounter Note (Signed)
I called pt and notified him of the results and he states that he has stop metformin and he has been Check BS and he is going to call back in about 1 week with those readings.  I advised him that was great and to call back sooner if needed.  Pt voiced understanding.    Comments added by RLM, CMA  on 08/11/23 at 16:01.

## 2023-08-15 ENCOUNTER — Other Ambulatory Visit: Payer: Medicare Other

## 2023-08-17 ENCOUNTER — Other Ambulatory Visit (INDEPENDENT_AMBULATORY_CARE_PROVIDER_SITE_OTHER): Payer: Self-pay | Admitting: Family Medicine

## 2023-08-17 DIAGNOSIS — N138 Other obstructive and reflux uropathy: Secondary | ICD-10-CM

## 2023-08-18 NOTE — Telephone Encounter (Signed)
Last scheduled appointment with you was 08/03/2023, Pt is to follow with new provider in 4 months (Jan 25)  Currently scheduled future appointment is Visit date not found,     Patient has been seen within the last year: Yes.    Confirmed preferred pharmacy for this refill encounter is   Preferred Pharmacy       [phone] [place], [place] - [address] [facility]    [address] [place] [address]    Phone: [phone] Fax: [phone]    Hours: Not open 24 hours        . I updated med list and Rx or Rx's are ready to [facility]     Adriana Simas, MA  08/18/2023, 08:15

## 2023-08-21 ENCOUNTER — Encounter (INDEPENDENT_AMBULATORY_CARE_PROVIDER_SITE_OTHER): Payer: Self-pay | Admitting: Family Medicine

## 2023-08-21 ENCOUNTER — Telehealth (INDEPENDENT_AMBULATORY_CARE_PROVIDER_SITE_OTHER): Payer: Self-pay | Admitting: Family Medicine

## 2023-08-21 NOTE — Telephone Encounter (Signed)
Blood Sugar results  (Newest Message First)  View All Conversations on this Encounter  You routed conversation to Lacy Duverney, MD2 hours ago (1:29 PM)     Zenovia Jordan  P [facility] Clinical Support (supporting Lacy Duverney, MD)2 hours ago (1:21 PM)       On my last visit you removed metforman 2x 500mg  and added iron. To improve diarrhea. Diarrhea is much improved with it being present 2-3 times a week instead of everyday. A1c was 7.1 when on  metforman. You requested fasting sugar numbers for 2 weeks. Average fasting sugar is 155 for 16 days. Numbers are below. You said you might put me on something stronger.  Do you want to make any medicine changes? and/or continue with testing.  145, 175, 177, 174, 179, 188, 153, 150, 146, 150, 159, 130, 154, 128, 140, 132   These represent a better diet.  THANKS for all your help over the years.      Blood sugar results  (Newest Message First)  View All Conversations on this Encounter  LUSTER HECHLER  P [facility] Clinical Support (supporting Lacy Duverney, MD)8 minutes ago (3:50 PM)       Additional comment. I noticed these results improved with time.  I speculate that is due to  me being out of town and not being able to take my repatha. I am about a week late on it.  I know it can cause higher blood sugar.

## 2023-08-22 NOTE — Telephone Encounter (Signed)
My chart message sent to pt giving him what Dr Renaldo Fiddler stated about his Questions on Blood sugar readings.    Comments added by RLM, CMA  on 08/22/23 at 09:47.

## 2023-08-22 NOTE — Telephone Encounter (Signed)
I am pretty proud of those blood sugars if they can stay 150 or lower would wait to start anything else - already on farxiga and this great for cardiac as well.  Yes repatha can have an effect on sugars but I am hoping with regular monitoring this can help with nutrition to keep sugars controlled as well :)

## 2023-08-23 ENCOUNTER — Ambulatory Visit: Payer: Medicare Other | Admitting: Cardiology

## 2023-08-29 ENCOUNTER — Encounter (INDEPENDENT_AMBULATORY_CARE_PROVIDER_SITE_OTHER): Payer: Self-pay | Admitting: Family Medicine

## 2023-09-11 ENCOUNTER — Other Ambulatory Visit (INDEPENDENT_AMBULATORY_CARE_PROVIDER_SITE_OTHER): Payer: Self-pay | Admitting: Family Medicine

## 2023-09-11 DIAGNOSIS — I10 Essential (primary) hypertension: Secondary | ICD-10-CM

## 2023-09-11 DIAGNOSIS — K219 Gastro-esophageal reflux disease without esophagitis: Secondary | ICD-10-CM

## 2023-09-11 NOTE — Telephone Encounter (Signed)
Last visit with PCP was 08/03/2023.    Last office visit in the department was 08/03/2023 .  No future appt scheduled.     Axel Filler, RN, 09/11/2023 , 11:14

## 2023-09-16 ENCOUNTER — Other Ambulatory Visit (INDEPENDENT_AMBULATORY_CARE_PROVIDER_SITE_OTHER): Payer: Self-pay | Admitting: Family Medicine

## 2023-09-16 DIAGNOSIS — F32A Depression, unspecified: Secondary | ICD-10-CM

## 2023-09-18 NOTE — Telephone Encounter (Signed)
Last scheduled appointment with you was 08/03/2023.    Currently scheduled future appointment is Visit date not found.    30 day supply pending with note to pharmacy that Pt must est with a new provider    Rod Mae, MA  09/18/2023, 08:58

## 2023-09-19 ENCOUNTER — Other Ambulatory Visit (INDEPENDENT_AMBULATORY_CARE_PROVIDER_SITE_OTHER): Payer: Self-pay | Admitting: Family Medicine

## 2023-09-19 DIAGNOSIS — K219 Gastro-esophageal reflux disease without esophagitis: Secondary | ICD-10-CM

## 2023-09-19 NOTE — Telephone Encounter (Signed)
Sent on 09/11/2023

## 2023-09-20 NOTE — Telephone Encounter (Signed)
I called pt to make him an appointment.   Pt states that he is still in [place] from where he fell and broke his hip.  He will be home on Sat and would like to schedule with Dr Carma Leaven.  We scheduled him for 09/28/23 for 60 mins.      Pt states that he is not in need of refill at this time he will discuss at appt with Dr Carma Leaven next week.   Comments added by RLM, CMA  on 09/20/23 at 11:17.

## 2023-09-28 ENCOUNTER — Other Ambulatory Visit: Payer: Self-pay

## 2023-09-28 ENCOUNTER — Ambulatory Visit: Payer: Medicare Other | Attending: FAMILY MEDICINE | Admitting: FAMILY MEDICINE

## 2023-09-28 ENCOUNTER — Encounter (INDEPENDENT_AMBULATORY_CARE_PROVIDER_SITE_OTHER): Payer: Self-pay | Admitting: FAMILY MEDICINE

## 2023-09-28 VITALS — BP 104/78 | HR 76 | Temp 97.6°F | Resp 17 | Ht 75.0 in | Wt 216.0 lb

## 2023-09-28 DIAGNOSIS — I129 Hypertensive chronic kidney disease with stage 1 through stage 4 chronic kidney disease, or unspecified chronic kidney disease: Secondary | ICD-10-CM

## 2023-09-28 DIAGNOSIS — G4701 Insomnia due to medical condition: Secondary | ICD-10-CM | POA: Insufficient documentation

## 2023-09-28 DIAGNOSIS — C4321 Malignant melanoma of right ear and external auricular canal: Secondary | ICD-10-CM | POA: Insufficient documentation

## 2023-09-28 DIAGNOSIS — D508 Other iron deficiency anemias: Secondary | ICD-10-CM | POA: Insufficient documentation

## 2023-09-28 DIAGNOSIS — E782 Mixed hyperlipidemia: Secondary | ICD-10-CM | POA: Insufficient documentation

## 2023-09-28 DIAGNOSIS — N138 Other obstructive and reflux uropathy: Secondary | ICD-10-CM | POA: Insufficient documentation

## 2023-09-28 DIAGNOSIS — M1A9XX0 Chronic gout, unspecified, without tophus (tophi): Secondary | ICD-10-CM | POA: Insufficient documentation

## 2023-09-28 DIAGNOSIS — I25110 Atherosclerotic heart disease of native coronary artery with unstable angina pectoris: Secondary | ICD-10-CM | POA: Insufficient documentation

## 2023-09-28 DIAGNOSIS — I10 Essential (primary) hypertension: Secondary | ICD-10-CM | POA: Insufficient documentation

## 2023-09-28 DIAGNOSIS — S72102B Unspecified trochanteric fracture of left femur, initial encounter for open fracture type I or II: Secondary | ICD-10-CM | POA: Insufficient documentation

## 2023-09-28 DIAGNOSIS — F32A Depression, unspecified: Secondary | ICD-10-CM | POA: Insufficient documentation

## 2023-09-28 DIAGNOSIS — E1122 Type 2 diabetes mellitus with diabetic chronic kidney disease: Secondary | ICD-10-CM | POA: Insufficient documentation

## 2023-09-28 DIAGNOSIS — Z125 Encounter for screening for malignant neoplasm of prostate: Secondary | ICD-10-CM | POA: Insufficient documentation

## 2023-09-28 DIAGNOSIS — I480 Paroxysmal atrial fibrillation: Secondary | ICD-10-CM | POA: Insufficient documentation

## 2023-09-28 DIAGNOSIS — S62639A Displaced fracture of distal phalanx of unspecified finger, initial encounter for closed fracture: Secondary | ICD-10-CM | POA: Insufficient documentation

## 2023-09-28 DIAGNOSIS — N1831 Chronic kidney disease, stage 3a: Secondary | ICD-10-CM | POA: Insufficient documentation

## 2023-09-28 DIAGNOSIS — I2699 Other pulmonary embolism without acute cor pulmonale: Secondary | ICD-10-CM | POA: Insufficient documentation

## 2023-09-28 DIAGNOSIS — N401 Enlarged prostate with lower urinary tract symptoms: Secondary | ICD-10-CM | POA: Insufficient documentation

## 2023-09-28 DIAGNOSIS — S5292XA Unspecified fracture of left forearm, initial encounter for closed fracture: Secondary | ICD-10-CM | POA: Insufficient documentation

## 2023-09-28 MED ORDER — NYSTATIN 100,000 UNIT/GRAM TOPICAL POWDER
Freq: Two times a day (BID) | CUTANEOUS | 1 refills | Status: DC
Start: 2023-09-28 — End: 2024-06-27

## 2023-09-28 MED ORDER — CALAMINE 8 %-ZINC OXIDE 8 % LOTION
TOPICAL_LOTION | Freq: Three times a day (TID) | CUTANEOUS | 1 refills | Status: DC | PRN
Start: 2023-09-28 — End: 2024-10-02

## 2023-09-28 NOTE — Nursing Note (Signed)
09/28/23 1312   Recent Weight Change   Have you had a recent unexplained weight loss or gain? N   Domestic Violence   Because we are aware of abuse and domestic violence today, we ask all patients: Are you being hurt, hit, or frightened by anyone at your home or in your life?  N   Basic Needs   Do you have any basic needs within your home that are not being met? (such as Food, Shelter, [employer] fast streamer, Tranportation, paying for bills and/or medications) N     PHQ Questionnaire  Little interest or pleasure in doing things.: Not at all  Feeling down, depressed, or hopeless: Not at all  PHQ 2 Total: 0        09/28/2023     1:12 PM   GAD-2 Questionnaire   Feeling nervous,anxious,on edge 0   Not being able to stop or control worrying 0   GAD-2 Score 0

## 2023-09-28 NOTE — Progress Notes (Unsigned)
[facility]  [facility]  Primary Care  [phone]      Nursing Reason for Visit: Establish Care (Previous PCP was Dr. Renaldo Fiddler), Hip Injury (Hip fracture. [facility]. The surgeon advised Pt that he may have osteoporosis, Pt requesting DEXA ), Low Blood Pressure (Pt states that the nursing home [facility] and [place]) changed his medications. But he has been taking his original medications. ), and Rash (Groin while in the hospital. Pt was on Zinc Oxzide, Fluconazole and Nyamyc powder. Pt feels this is contact dermatitis. Pt would like this evaluated. )    Chief Complaint:  Dustin Webster is a 67 y.o. male who is being seen today for chronic conditions.     Assessment and Plan:   Problem List Items Addressed This Visit          Cardiovascular System    Pulmonary emboli (CMS HCC) - Primary     Chronic in nature and controlled on current regimen, continue. Labs reviewed and ordered. Will follow.           Mixed hyperlipidemia     Chronic in nature and controlled on current regimen, continue. Labs reviewed and ordered. Will follow.           Relevant Orders    LIPID PANEL    HTN (hypertension)     Chronic in nature and controlled on current regimen, continue. Labs reviewed and ordered. Will follow.           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)    Relevant Orders    HGA1C (HEMOGLOBIN A1C WITH EST AVG GLUCOSE)    COMPREHENSIVE METABOLIC PNL, FASTING       Hematologic/Lymphatic    Iron deficiency anemia    Relevant Orders    IRON TRANSFERRIN AND TIBC    FERRITIN    CBC/DIFF       Musculoskeletal    Open trochanteric fracture of left femur (CMS HCC)    Relevant Orders    DEXA BONE DENSITOMETRY    Closed  fracture of distal phalanx of digit of hand    Relevant Orders    DEXA BONE DENSITOMETRY    Nondisplaced fracture of left radius    Relevant Orders    DEXA BONE DENSITOMETRY       Ear, Nose, and Throat    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  ELIQUIS 5MG  bid        Mixed Hyperlipidemia    Current Medication: repatha TriCor 145 mg daily and repatha   Side Effects of Medication: none  History of Statin Intolerance:  yes  Diet: fairly healthy gluten free  Exercise: starting   Lab Results   Component Value Date    CHOLESTEROL 109 08/04/2023    HDLCHOL 39 (L) 08/04/2023    LDLCHOL 30 08/04/2023    LDLCHOLDIR 91 05/07/2018    TRIG 199 (H) 08/04/2023                 Htn (Hypertension)    Blood Pressure Medications: cozaar 25 mg daily Toprol XL 25 mg daily   Blood Pressure Measurements at home: normal   Concerning Symptoms: no chest pain or dyspnea no edema   Medication Side Effects: none  Diet: fairly healthy  gluten free   Exercise:starting   Specialist Care: cardiology           Cad (Coronary Artery Disease), Native Coronary Artery    On Toprol, cozaar, TriCor and repatha      Chronic Gout (Resolved)    Was on allopurinol but stopped due to CKD   Never had a flare up           Review of Systems  Negative unless otherwise mentioned in HPI    Vital Signs  Vitals:    09/28/23 1317   BP: 104/78   Pulse: 76   Resp: 17   Temp: 36.4 C (97.6 F)   TempSrc: Temporal   SpO2: 96%   Weight: 98 kg (216 lb)   Height: 1.905 m (6\' 3" )   BMI: 27.05          Physical Exam  PHQ Questionnaire  Little interest or pleasure in doing things.: Not at all  Feeling down, depressed, or hopeless: Not at all  PHQ 2  Total: 0          Nursing Notes:   Rod Mae, MA  09/28/23 1313  Signed     09/28/23 1312   Recent Weight Change   Have you had a recent unexplained weight loss or gain? N   Domestic Violence   Because we are aware of abuse and domestic violence today, we ask all patients: Are you being hurt, hit, or frightened by anyone at your home or in your life?  N   Basic Needs   Do you have any basic needs within your home that are not being met? (such as Food, Shelter, [employer] fast streamer, Tranportation, paying for bills and/or medications) N     PHQ Questionnaire  Little interest or pleasure in doing things.: Not at all  Feeling down, depressed, or hopeless: Not at all  PHQ 2 Total: 0        09/28/2023     1:12 PM   GAD-2 Questionnaire   Feeling nervous,anxious,on edge 0   Not being able to stop or control worrying 0   GAD-2 Score 0          Medications Discontinued During This Encounter   Medication Reason    fluocinonide (LIDEX) 0.05 % Cream Therapy completed    hydrocortisone 2.5 % Apply externally cream with perineal applicator Therapy completed    metFORMIN (GLUCOPHAGE) 500 mg Oral Tablet Patient states no longer taking    sildenafiL, pulm.hypertension, (REVATIO) 20 mg Tablet Therapy completed    amLODIPine (NORVASC) 10 mg Oral Tablet      Orders Placed This Encounter    DEXA BONE DENSITOMETRY    HGA1C (HEMOGLOBIN A1C WITH EST AVG GLUCOSE)    COMPREHENSIVE METABOLIC PNL, FASTING    PSA SCREENING    IRON TRANSFERRIN AND TIBC  FERRITIN    CBC/DIFF    LIPID PANEL    calamine Lotion    nystatin (NYSTOP) 100,000 unit/gram Powder                     Follow up: Return in about 1 month (around 10/28/2023) for BP .    Lou Miner, MD

## 2023-09-29 ENCOUNTER — Encounter (INDEPENDENT_AMBULATORY_CARE_PROVIDER_SITE_OTHER): Payer: Self-pay | Admitting: FAMILY MEDICINE

## 2023-09-29 NOTE — Assessment & Plan Note (Signed)
Chronic in nature and controlled on current regimen, continue. Labs reviewed and ordered. Will follow.

## 2023-10-05 ENCOUNTER — Other Ambulatory Visit: Payer: Self-pay

## 2023-10-05 ENCOUNTER — Ambulatory Visit
Admission: RE | Admit: 2023-10-05 | Discharge: 2023-10-05 | Disposition: A | Discharge status: Home / Self Care | Payer: Medicare Other | Source: Ambulatory Visit | Attending: FAMILY MEDICINE | Admitting: FAMILY MEDICINE

## 2023-10-05 DIAGNOSIS — S62639A Displaced fracture of distal phalanx of unspecified finger, initial encounter for closed fracture: Secondary | ICD-10-CM

## 2023-10-05 DIAGNOSIS — S5292XA Unspecified fracture of left forearm, initial encounter for closed fracture: Secondary | ICD-10-CM

## 2023-10-05 DIAGNOSIS — S72102B Unspecified trochanteric fracture of left femur, initial encounter for open fracture type I or II: Secondary | ICD-10-CM

## 2023-11-03 ENCOUNTER — Ambulatory Visit
Admission: RE | Admit: 2023-11-03 | Discharge: 2023-11-03 | Disposition: A | Discharge status: Home / Self Care | Payer: Medicare Other | Source: Ambulatory Visit | Attending: FAMILY MEDICINE | Admitting: FAMILY MEDICINE

## 2023-11-03 ENCOUNTER — Other Ambulatory Visit: Payer: Self-pay

## 2023-11-03 ENCOUNTER — Other Ambulatory Visit: Payer: Medicare Other | Admitting: Radiology

## 2023-11-03 DIAGNOSIS — S5292XA Unspecified fracture of left forearm, initial encounter for closed fracture: Secondary | ICD-10-CM | POA: Insufficient documentation

## 2023-11-03 DIAGNOSIS — S62639A Displaced fracture of distal phalanx of unspecified finger, initial encounter for closed fracture: Secondary | ICD-10-CM | POA: Insufficient documentation

## 2023-11-03 DIAGNOSIS — M810 Age-related osteoporosis without current pathological fracture: Secondary | ICD-10-CM | POA: Insufficient documentation

## 2023-11-03 DIAGNOSIS — S72102B Unspecified trochanteric fracture of left femur, initial encounter for open fracture type I or II: Secondary | ICD-10-CM | POA: Insufficient documentation

## 2023-11-06 ENCOUNTER — Other Ambulatory Visit: Payer: Medicare Other | Attending: Family Medicine

## 2023-11-06 ENCOUNTER — Other Ambulatory Visit: Payer: Self-pay

## 2023-11-06 DIAGNOSIS — E782 Mixed hyperlipidemia: Secondary | ICD-10-CM | POA: Insufficient documentation

## 2023-11-06 DIAGNOSIS — D508 Other iron deficiency anemias: Secondary | ICD-10-CM | POA: Insufficient documentation

## 2023-11-06 DIAGNOSIS — E1122 Type 2 diabetes mellitus with diabetic chronic kidney disease: Secondary | ICD-10-CM | POA: Insufficient documentation

## 2023-11-06 DIAGNOSIS — Z125 Encounter for screening for malignant neoplasm of prostate: Secondary | ICD-10-CM | POA: Insufficient documentation

## 2023-11-06 DIAGNOSIS — E611 Iron deficiency: Secondary | ICD-10-CM | POA: Insufficient documentation

## 2023-11-06 DIAGNOSIS — N1831 Chronic kidney disease, stage 3a: Secondary | ICD-10-CM | POA: Insufficient documentation

## 2023-11-06 LAB — COMPREHENSIVE METABOLIC PNL, FASTING
ALBUMIN: 4.5 g/dL (ref 3.5–5.0)
ALKALINE PHOSPHATASE: 61 U/L (ref 38–126)
ALT (SGPT): 23 U/L (ref <=50)
ANION GAP: 13 mmol/L
AST (SGOT): 21 U/L (ref 17–59)
BILIRUBIN TOTAL: 0.5 mg/dL (ref 0.2–1.3)
BUN/CREA RATIO: 22
BUN: 32 mg/dL — ABNORMAL HIGH (ref 9–20)
CALCIUM: 9.6 mg/dL (ref 8.4–10.2)
CHLORIDE: 102 mmol/L (ref 98–107)
CO2 TOTAL: 24 mmol/L (ref 22–30)
CREATININE: 1.47 mg/dL — ABNORMAL HIGH (ref 0.66–1.25)
ESTIMATED GFR: 52 mL/min/{1.73_m2} — ABNORMAL LOW (ref >=60)
GLUCOSE: 162 mg/dL — ABNORMAL HIGH (ref 74–106)
POTASSIUM: 4.4 mmol/L (ref 3.5–5.1)
PROTEIN TOTAL: 7.4 g/dL (ref 6.3–8.2)
SODIUM: 139 mmol/L (ref 137–145)

## 2023-11-06 LAB — CBC WITH DIFF
BASOPHIL #: <0.1 10*3/uL (ref <=0.20)
BASOPHIL %: 1.4 %
EOSINOPHIL #: 0.2 10*3/uL (ref <=0.50)
EOSINOPHIL %: 4 %
HCT: 47.2 % (ref 38.9–52.0)
HGB: 15 g/dL (ref 13.4–17.5)
IMMATURE GRANULOCYTE #: <0.1 10*3/uL (ref <0.10)
IMMATURE GRANULOCYTE %: 1 % (ref 0.0–1.0)
LYMPHOCYTE #: 2.14 10*3/uL (ref 1.00–4.80)
LYMPHOCYTE %: 42.8 %
MCH: 28 pg (ref 26.0–32.0)
MCHC: 31.8 g/dL (ref 31.0–35.5)
MCV: 88.1 fL (ref 78.0–100.0)
MONOCYTE #: 0.51 10*3/uL (ref 0.20–1.10)
MONOCYTE %: 10.2 %
MPV: 11.6 fL (ref 8.7–12.5)
NEUTROPHIL #: 2.03 10*3/uL (ref 1.50–7.70)
NEUTROPHIL %: 40.6 %
PLATELETS: 251 10*3/uL (ref 150–400)
RBC: 5.36 10*6/uL (ref 4.50–6.10)
RDW-CV: 14.9 % (ref 11.5–15.5)
WBC: 5 10*3/uL (ref 3.7–11.0)

## 2023-11-06 LAB — COMPREHENSIVE METABOLIC PANEL, NON-FASTING
ALBUMIN: 4.5 g/dL (ref 3.5–5.0)
ALKALINE PHOSPHATASE: 62 U/L (ref 38–126)
ALT (SGPT): 24 U/L (ref <=50)
ANION GAP: 14 mmol/L
AST (SGOT): 21 U/L (ref 17–59)
BILIRUBIN TOTAL: 0.5 mg/dL (ref 0.2–1.3)
BUN/CREA RATIO: 23
BUN: 32 mg/dL — ABNORMAL HIGH (ref 9–20)
CALCIUM: 9.7 mg/dL (ref 8.4–10.2)
CHLORIDE: 102 mmol/L (ref 98–107)
CO2 TOTAL: 24 mmol/L (ref 22–30)
CREATININE: 1.39 mg/dL — ABNORMAL HIGH (ref 0.66–1.25)
ESTIMATED GFR: 55 mL/min/{1.73_m2} — ABNORMAL LOW (ref >=60)
GLUCOSE: 162 mg/dL — ABNORMAL HIGH (ref 74–106)
POTASSIUM: 4.6 mmol/L (ref 3.5–5.1)
PROTEIN TOTAL: 7.5 g/dL (ref 6.3–8.2)
SODIUM: 140 mmol/L (ref 137–145)

## 2023-11-06 LAB — LIPID PANEL
CHOLESTEROL: 146 mg/dL (ref 0–199)
HDL CHOL: 40 mg/dL (ref 40–60)
LDL CALC: 66 mg/dL (ref 0–130)
TRIGLYCERIDES: 202 mg/dL — ABNORMAL HIGH (ref 0–149)

## 2023-11-06 LAB — VITAMIN D 25 TOTAL: VITAMIN D 25, TOTAL: 40.3 ng/mL (ref 30.00–100.00)

## 2023-11-06 LAB — PHOSPHORUS: PHOSPHORUS: 3.7 mg/dL (ref 2.5–4.5)

## 2023-11-06 LAB — HGA1C (HEMOGLOBIN A1C WITH EST AVG GLUCOSE)
ESTIMATED AVERAGE GLUCOSE: 131 mg/dL
HEMOGLOBIN A1C: 6.2 % — ABNORMAL HIGH (ref <5.7)

## 2023-11-06 LAB — IRON TRANSFERRIN AND TIBC
IRON (TRANSFERRIN) SATURATION: 22 % (ref 20–55)
IRON: 91 ug/dL (ref 49–181)
TOTAL IRON BINDING CAPACITY: 406 ug/dL (ref 261–462)

## 2023-11-06 LAB — PARATHYROID HORMONE (PTH): PTH: 32.3 pg/mL (ref 14.2–75.2)

## 2023-11-06 LAB — MAGNESIUM: MAGNESIUM: 1.9 mg/dL (ref 1.6–2.3)

## 2023-11-06 LAB — PSA SCREENING: PSA: 0.27 ng/mL (ref 0.00–4.00)

## 2023-11-06 LAB — FERRITIN: FERRITIN: 52 ng/mL (ref 18–464)

## 2023-11-06 NOTE — Ancillary Notes (Signed)
Dustin Webster    Venipuncture performed in office on left arm antecubital vein, dry pressure dressing was applied to site and patient tolerated it well.  Specimen was centrifuged, aliquoted as needed and specimen was labeled and packaged for transport.    Lenae Wherley, PHLEBOTOMIST  11/06/2023, 07:29

## 2023-11-10 ENCOUNTER — Ambulatory Visit: Payer: Medicare Other | Attending: FAMILY MEDICINE | Admitting: FAMILY MEDICINE

## 2023-11-10 ENCOUNTER — Other Ambulatory Visit: Payer: Self-pay

## 2023-11-10 ENCOUNTER — Encounter (INDEPENDENT_AMBULATORY_CARE_PROVIDER_SITE_OTHER): Payer: Self-pay | Admitting: FAMILY MEDICINE

## 2023-11-10 VITALS — BP 136/88 | HR 78 | Temp 97.0°F | Resp 18 | Wt 223.0 lb

## 2023-11-10 DIAGNOSIS — E1122 Type 2 diabetes mellitus with diabetic chronic kidney disease: Secondary | ICD-10-CM | POA: Insufficient documentation

## 2023-11-10 DIAGNOSIS — N1831 Chronic kidney disease, stage 3a: Secondary | ICD-10-CM | POA: Insufficient documentation

## 2023-11-10 DIAGNOSIS — M8000XD Age-related osteoporosis with current pathological fracture, unspecified site, subsequent encounter for fracture with routine healing: Secondary | ICD-10-CM | POA: Insufficient documentation

## 2023-11-10 DIAGNOSIS — I10 Essential (primary) hypertension: Secondary | ICD-10-CM | POA: Insufficient documentation

## 2023-11-10 DIAGNOSIS — G4733 Obstructive sleep apnea (adult) (pediatric): Secondary | ICD-10-CM | POA: Insufficient documentation

## 2023-11-10 LAB — DIABETIC RETINAL PHOTOS: DIABETIC RETINOPATHY Y/N: NEGATIVE

## 2023-11-10 MED ORDER — ALENDRONATE 70 MG TABLET
70.0000 mg | ORAL_TABLET | ORAL | 0 refills | Status: DC
Start: 2023-11-10 — End: 2024-01-30

## 2023-11-10 MED ORDER — FLUTICASONE PROPIONATE 50 MCG/ACTUATION NASAL SPRAY,SUSPENSION
1.0000 | Freq: Every day | NASAL | 2 refills | Status: DC
Start: 2023-11-10 — End: 2024-01-02

## 2023-11-10 MED ORDER — FLUTICASONE PROPIONATE 50 MCG/ACTUATION NASAL SPRAY,SUSPENSION
1.0000 | Freq: Every day | NASAL | 2 refills | Status: DC
Start: 2023-11-10 — End: 2023-11-10

## 2023-11-10 NOTE — Nursing Note (Signed)
11/10/23 0943   PHQ 9 (follow up)   Little interest or pleasure in doing things. 0   Feeling down, depressed, or hopeless 1   Trouble falling or staying asleep, or sleeping too much. 1   Feeling tired or having little energy 1   Poor appetite or overeating 1   Feeling bad about yourself/ that you are a failure in the past 2 weeks? 0   Trouble concentrating on things in the past 2 weeks? 0   Moving/Speaking slowly or being fidgety or restless  in the past 2 weeks? 1   Thoughts that you would be better off DEAD, or of hurting yourself in some way. 0   If you checked off any problems, how difficult have these problems made it for you to do your work, take care of things at home, or get along with other people? Not difficult at all   PHQ 9 Total 5   Interpretation of Total Score Mild depression

## 2023-11-10 NOTE — Progress Notes (Signed)
[facility]  [place]  Primary Care  [phone]      Nursing Reason for Visit: Blood Pressure F/U (Pt is here today to follow up regarding his blood pressure, pt did bring his blood pressure log. Pt states he made the decision on 12/21/2022 to start taking his amlodipine 10mg . ) and Sleep Apnea (Pt would like to discuss his sleep apnea, pt did bring his sleep apnea report from 03/29/2023)    Chief Complaint:  Dustin Webster is a 67 y.o. male who is being seen today for chronic conditions.     Assessment and Plan:   Problem List Items Addressed This Visit          Cardiovascular System    HTN (hypertension)     Chronic in nature and controlled on current regimen, continue. Labs reviewed and ordered. Will follow.              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    Relevant Orders    DME - HOME PAP (BIPAP/CPAP) DEVICE OR SUPPLIES (POSITIVE AIRWAY PRESSURE DEVICE)    DIABETIC RETINAL PHOTOS (Completed)       Musculoskeletal    Age-related osteoporosis with current pathological fracture with routine healing          Discussion of Medical Issues:  Problem   Age-Related Osteoporosis With Current Pathological Fracture With Routine Healing    Started fosamax weekly      Htn (Hypertension)    Blood Pressure Medications: cozaar 25 mg daily Toprol XL 25 mg daily and Norvasc 10 mg   Blood Pressure Measurements at home: normal   Concerning Symptoms: no chest pain or dyspnea no edema   Medication Side Effects: none  Diet: fairly healthy  gluten free   Exercise:starting   Specialist Care: cardiology                Review of Systems  Negative unless otherwise mentioned in HPI    Vital Signs  Vitals:    11/10/23 0949   BP: 136/88   Pulse: 78   Resp: 18   Temp: 36.1 C (97 F)   TempSrc: Thermal  Scan   SpO2: 97%   Weight: 101 kg (223 lb)          Physical Exam  Constitutional:       Appearance: Normal appearance. He is obese.   Cardiovascular:      Rate and Rhythm: Normal rate and regular rhythm.      Pulses: Normal pulses.      Heart sounds: Normal heart sounds. No murmur heard.     No gallop.   Pulmonary:      Effort: Pulmonary effort is normal. No respiratory distress.      Breath sounds: Normal breath sounds. No wheezing or rales.   Abdominal:      General: Abdomen is flat. Bowel sounds are normal. There is no distension.      Palpations: Abdomen is soft.      Tenderness: There is no abdominal tenderness.   Skin:     General: Skin is warm and dry.      Coloration: Skin is not jaundiced.   Neurological:  General: No focal deficit present.      Mental Status: He is alert and oriented to person, place, and time. Mental status is at baseline.   Psychiatric:         Mood and Affect: Mood normal.         Behavior: Behavior normal.       PHQ Questionnaire  Little interest or pleasure in doing things.: Not at all  Feeling down, depressed, or hopeless: Several Days  PHQ 2 Total: 1  Trouble falling or staying asleep, or sleeping too much.: Several Days  Feeling tired or having little energy: Several Days  Poor appetite or overeating: Several Days  Feeling bad about yourself/ that you are a failure in the past 2 weeks?: Not at all  Trouble concentrating on things in the past 2 weeks?: Not at all  Moving/Speaking slowly or being fidgety or restless  in the past 2 weeks?: Several Days  Thoughts that you would be better off DEAD, or of hurting yourself in some way.: Not at all  If you checked off any problems, how difficult have these problems made it for you to do your work, take care of things at home, or get along with other people?: Not difficult at all  PHQ 9 Total: 5  Interpretation of Total Score: 5-9 Mild depression          Nursing Notes:   Idelle Leech, LPN  96/04/54 0981  Signed     11/10/23 0943    Domestic Violence   Because we are aware of abuse and domestic violence today, we ask all patients: Are you being hurt, hit, or frightened by anyone at your home or in your life?  N   Basic Needs   Do you have any basic needs within your home that are not being met? (such as Food, Shelter, [employer] fast streamer, Tranportation, paying for bills and/or medications) Ronney Asters, LPN  19/14/78 [id number]  Signed     11/10/23 0943   PHQ 9 (follow up)   Little interest or pleasure in doing things. 0   Feeling down, depressed, or hopeless 1   Trouble falling or staying asleep, or sleeping too much. 1   Feeling tired or having little energy 1   Poor appetite or overeating 1   Feeling bad about yourself/ that you are a failure in the past 2 weeks? 0   Trouble concentrating on things in the past 2 weeks? 0   Moving/Speaking slowly or being fidgety or restless  in the past 2 weeks? 1   Thoughts that you would be better off DEAD, or of hurting yourself in some way. 0   If you checked off any problems, how difficult have these problems made it for you to do your work, take care of things at home, or get along with other people? Not difficult at all   PHQ 9 Total 5   Interpretation of Total Score Mild depression          Medications Discontinued During This Encounter   Medication Reason    fluticasone propionate (FLONASE) 50 mcg/actuation Nasal Spray, Suspension      Orders Placed This Encounter    DIABETIC RETINAL PHOTOS    alendronate (FOSAMAX) 70 mg Oral Tablet    fluticasone propionate (FLONASE) 50 mcg/actuation Nasal Spray, Suspension    DME - HOME PAP (BIPAP/CPAP) DEVICE OR SUPPLIES (POSITIVE AIRWAY PRESSURE DEVICE)  Follow up: No follow-ups on file.    Lou Miner, MD

## 2023-11-10 NOTE — Nursing Note (Signed)
11/10/23 1610   Domestic Violence   Because we are aware of abuse and domestic violence today, we ask all patients: Are you being hurt, hit, or frightened by anyone at your home or in your life?  N   Basic Needs   Do you have any basic needs within your home that are not being met? (such as Food, Shelter, [employer] fast streamer, Tranportation, paying for bills and/or medications) N

## 2023-11-10 NOTE — Assessment & Plan Note (Signed)
Chronic in nature and controlled on current regimen, continue. Labs reviewed and ordered. Will follow.

## 2023-11-16 NOTE — Result Encounter Note (Signed)
 Labs are stable and can be discussed in detail on follow up appointment

## 2023-11-20 ENCOUNTER — Ambulatory Visit (INDEPENDENT_AMBULATORY_CARE_PROVIDER_SITE_OTHER): Payer: Self-pay

## 2023-11-29 ENCOUNTER — Ambulatory Visit (INDEPENDENT_AMBULATORY_CARE_PROVIDER_SITE_OTHER): Payer: Medicare Other

## 2023-11-29 ENCOUNTER — Other Ambulatory Visit: Payer: Self-pay

## 2023-11-29 ENCOUNTER — Encounter (HOSPITAL_BASED_OUTPATIENT_CLINIC_OR_DEPARTMENT_OTHER): Payer: Self-pay

## 2023-11-29 ENCOUNTER — Ambulatory Visit
Admission: RE | Admit: 2023-11-29 | Discharge: 2023-11-29 | Disposition: A | Discharge status: Home / Self Care | Payer: Medicare Other | Source: Ambulatory Visit

## 2023-11-29 VITALS — BP 120/86 | HR 74 | Ht 74.5 in | Wt 227.0 lb

## 2023-11-29 DIAGNOSIS — Z86718 Personal history of other venous thrombosis and embolism: Secondary | ICD-10-CM

## 2023-11-29 DIAGNOSIS — I2510 Atherosclerotic heart disease of native coronary artery without angina pectoris: Secondary | ICD-10-CM

## 2023-11-29 DIAGNOSIS — E782 Mixed hyperlipidemia: Secondary | ICD-10-CM

## 2023-11-29 DIAGNOSIS — Z951 Presence of aortocoronary bypass graft: Secondary | ICD-10-CM

## 2023-11-29 DIAGNOSIS — I480 Paroxysmal atrial fibrillation: Secondary | ICD-10-CM

## 2023-11-29 DIAGNOSIS — I2699 Other pulmonary embolism without acute cor pulmonale: Secondary | ICD-10-CM

## 2023-11-29 DIAGNOSIS — I10 Essential (primary) hypertension: Secondary | ICD-10-CM

## 2023-11-29 NOTE — Progress Notes (Signed)
[facility]  [address]  [place] [address]  [phone]    Date:   11/29/2023  Name: Dustin Webster  Age: 68 y.o.    REASON FOR VISIT:   Follow Up 6 Months     HISTORY OF PRESENTING ILLNESS:    Dustin Webster is a 68 y.o. male with cardiac history significant for CAD status post CABG, hypertension, hyperlipidemia, paroxysmal atrial fibrillation, history of deep vein thrombosis/PE and type 2 diabetes.  He has last seen in the office 05/26/2023.    The patient presents today for a follow-up appointment.  Patient reports he continues to have fatigue and feels he has a rhythm problem.  Patient reports when he checks his blood pressure his cuff tells me as an irregular rhythm.  Does have history of paroxysmal atrial fibrillation and PVCs noted on his previous stress test from an outside facility.  He is on metoprolol 25 mg daily he is also on Eliquis for thromboembolic protection denies bleeding complications.  Blood pressure heart rate are well controlled. Most recent LDL 66.  Most recent A1c 6.2. The patient denies chest pain, shortness of breath, dizziness, near-syncope, syncope, lower extremity edema, orthopnea or PND.      Current Outpatient Medications   Medication Sig    alendronate (FOSAMAX) 70 mg Oral Tablet Take 1 Tablet (70 mg total) by mouth Every 7 days for 90 days    amLODIPine (NORVASC) 10 mg Oral Tablet Take 1 Tablet (10 mg total) by mouth Once a day    apixaban (ELIQUIS) 5 mg Oral Tablet Take 1 Tablet (5 mg total) by mouth Twice daily Indications: blood clot in a deep vein of the extremities    calamine Lotion Apply topically Three times a day as needed (Patient not taking: Reported on 11/29/2023)    Calcium Carbonate (OS-CAL) 650 mg calcium (1,625 mg) Oral Tablet Every evening     Cholecalciferol, Vitamin D3, 50 mcg (2,000 unit) Oral Capsule Take by mouth    citalopram (CELEXA) 10 mg Oral Tablet Take 1 Tablet (10 mg total) by mouth Once a day    dapagliflozin  propanediol (FARXIGA) 10 mg Oral Tablet Take 1 Tablet (10 mg total) by mouth Once a day Indications: type 2 diabetes mellitus    evolocumab (REPATHA SURECLICK) 140 mg/mL Subcutaneous Pen Injector Inject 1 mL (140 mg total) under the skin Every 14 days    famotidine (PEPCID) 40 mg Oral Tablet TAKE 1 TABLET (40 MG TOTAL) BY MOUTH TWICE DAILY INDICATIONS: GASTROESOPHAGEAL REFLUX DISEASE    fenofibrate nanocrystallized (TRICOR) 145 mg Oral Tablet TAKE 1 TABLET (145 MG TOTAL) BY MOUTH EVERY MORNING BEFORE BREAKFAST    ferrous sulfate (FERATAB) 324 mg (65 mg iron) Oral Tablet, Delayed Release (E.C.) Take 1 Tablet (324 mg total) by mouth    fluticasone propionate (FLONASE) 50 mcg/actuation Nasal Spray, Suspension Administer 1 Spray into each nostril Once a day Indications: inflammation of the nose due to an allergy    folic acid (FOLVITE) 1 mg Oral Tablet Take 1 Tablet (1 mg total) by mouth Once a day    KRILL OIL ORAL Take by mouth    losartan (COZAAR) 25 mg Oral Tablet Take 1 Tablet (25 mg total) by mouth Once a day Indications: high blood pressure    LUTEIN ORAL Take by mouth    melatonin 1 mg Oral Tablet As directed    metoprolol succinate (TOPROL-XL) 25 mg Oral Tablet Sustained Release 24 hr Take 1 Tablet (  25 mg total) by mouth Once a day Indications: high blood pressure    multivit-min/ferrous fumarate (MULTI VITAMIN ORAL) Take 1 Tablet by mouth Once a day    nitroGLYCERIN (NITROSTAT) 0.4 mg Sublingual Tablet, Sublingual Place 1 Tablet (0.4 mg total) under the tongue Every 5 minutes as needed for Chest pain for 3 doses over 15 minutes    nystatin (NYSTOP) 100,000 unit/gram Powder Apply topically Twice daily    silodosin (RAPAFLO) 8 mg Oral Capsule TAKE 1 CAPSULE (8 MG TOTAL) BY MOUTH ONCE A DAY     Allergies   Allergen Reactions    Gluten      Celiac disease    Statin [Atorvastatin] Myalgia     All statin medications     Past Medical History:   Diagnosis Date    Anemia     Atelectasis 12/31/2015    Formatting of this  note might be different from the original.  History: post-op  Assessment: bibasilar atelectasis on CXR L>R.  Plan: Encourage ambulation, C&DB, PEP, pain control.      Atrial fibrillation (CMS HCC)     Blockage of coronary artery of heart (CMS HCC)     BPH associated with nocturia     CAD (coronary artery disease)     Celiac disease     Chest pain 05/07/2018    Chronic depression 05/23/2018    Chronic gout 01/02/2016    Was on allopurinol but stopped due to CKD   Never had a flare up       CKD (chronic kidney disease)     COVID-19 08/03/2020    Diet-controlled diabetes mellitus (CMS HCC) 05/23/2018    DVT (deep venous thrombosis) (CMS HCC)     Gastroesophageal reflux disease without esophagitis 05/23/2018    Insomnia 05/23/2018    Leg cramps, sleep related 05/23/2018    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  A/P  - Previously poorly controlled with fentanyl PCA, improved when switched to dilaudid; continue scheduled lidoderm patches and tyenol, prn oxycodone; goal pain score <4.      Pericardial effusion     Pulmonary emboli (CMS HCC)     Sleep apnea          Past Surgical History:   Procedure Laterality Date    HX CHOLECYSTECTOMY      HX CORONARY ARTERY BYPASS GRAFT      HX HEART SURGERY  12/2015    HX HERNIA REPAIR      Inguinal     HX LIPOMA RESECTION      Chest    ORIF HIP FRACTURE Left 2024    OTHER SURGICAL HISTORY Left 01/2016    Evacuation of hematoma -Left Atrium  at [facility]     SKIN CANCER EXCISION  06/21/2013    melanoma/ear/[facility]    TRANSURETHRAL MICROWAVE THERAPY  05/24/2018    Dr Evert Kohl         Family Medical History:       Problem Relation (Age of Onset)    Emphysema Father    Heart Attack General Family Hx    Heart Disease Mother, Brother    Hypertension (High Blood Pressure) Mother    No Known Problems Other, Sister, Son, Son    Stroke Mother, Brother            Social History      Socioeconomic  History    Marital status: Married     Spouse name: Marylene Land    Number of children: 1   Occupational History    Occupation: Other Comptroller     Comment: Retired   Tobacco Use    Smoking status: Never    Smokeless tobacco: Never   Vaping Use    Vaping status: Never Used   Substance and Sexual Activity    Alcohol use: Not Currently     Comment: rare    Drug use: Never    Sexual activity: Yes     Partners: Female       REVIEW OF SYSTEMS:  Constitutional: No fever.   Respiratory: See HPI.   Cardiovascular: See HPI.   All other systems reviewed and are negative except as noted in HOPI.    PHYSICAL EXAMINATION:     Vitals reviewed. BP 120/86 Comment: RTA  Pulse 74   Ht 1.892 m (6' 2.5")   Wt 103 kg (227 lb)   SpO2 96%   BMI 28.76 kg/m       Vitals Filed         11/29/2023  1347 11/29/2023  1348          BP: 116/84 120/86      Pulse: 74 --              Constitutional: He is oriented to person, place, and time.   Cardiovascular: Normal rate and rhythm without murmurs.  No JVD.  No peripheral edema is noted.  Pulmonary/Chest: Lungs clear.    Abdominal: Soft. Nontender, non distended, no organomegaly.    ASSESSMENT:  ENCOUNTER DIAGNOSES     ICD-10-CM   1. Coronary artery disease involving native coronary artery of native heart without angina pectoris  I25.10   2. Hx of CABG  Z95.1   3. Paroxysmal atrial fibrillation (CMS HCC)  I48.0   4. History of DVT of lower extremity  Z86.718   5. Pulmonary embolism, unspecified chronicity, unspecified pulmonary embolism type, unspecified whether acute cor pulmonale present (CMS HCC)  I26.99   6. Primary hypertension  I10   7. Mixed hyperlipidemia  E78.2       PLAN:    1. CAD status post CABG-patient denies anginal symptoms.  Continue current medical therapy and risk factor modification    2. Paroxysmal atrial fibrillation-patient reports ongoing fatigue and that his blood pressure cuff is telling him he is in arrhythmia.  Will place a 7 day monitor to assess  atrial fibrillation burden as well as burden of PVCs that were noted on stress test from outside facility.  Continue metoprolol 25 mg daily.  Continue Eliquis 5 mg b.i.d. for thromboembolic protection.  He denies bleeding complications.  Report signs or symptoms of bleeding to the office.  Recent labs within normal limits.  Patient also reports he was recently diagnosed with a obstructive sleep apnea and obtained his CPAP last night.  Encouraged compliance with CPAP.  We will also order echocardiogram setting of fatigue.    3. History of deep vein thrombosis/PE-no recurrent episodes    4. Hypertension-well controlled.  Continue current medical therapy with goal blood pressure less than 130/80    5. Hyperlipidemia-continue Repatha for goal LDL less than 70.  Most recent LDL 66.  Triglycerides slightly elevated 202.  Patient to continue Tricor.  Encouraged healthy diet and physical activity as tolerated    Follow-up in 8 weeks or sooner if needed.  All questions answered.  Patient voiced  understanding and agrees with plan.    Orders Placed This Encounter    7 DAY EXTENDED HOLTER MONITOR    TRANSTHORACIC ECHOCARDIOGRAM - ADULT COMPLETE       Return in about 8 weeks (around 01/24/2024) for Avangeline Stockburger . for continued management of chronic health issues including CAD, atrial fibrillation, hypertension, hyperlipidemia.    Electronically signed by     Morrie Sheldon, PA-C  11/29/2023, 08:10  Note reviewed and I agree with the above findings and assessment and plan by the nurse practitioner/PA.

## 2023-11-29 NOTE — Patient Instructions (Signed)
Continue same medications    Goal BP 100/60 to 130/80    Goal heart rate 60 to 100    Call or go to the ED for chest pain, shortness of breath, or palpitations

## 2023-11-30 ENCOUNTER — Encounter (INDEPENDENT_AMBULATORY_CARE_PROVIDER_SITE_OTHER): Payer: Self-pay

## 2023-11-30 ENCOUNTER — Ambulatory Visit: Payer: Medicare Other | Attending: Foot & Ankle Surgery

## 2023-11-30 VITALS — BP 122/82 | Ht 74.49 in | Wt 227.0 lb

## 2023-11-30 DIAGNOSIS — M79674 Pain in right toe(s): Secondary | ICD-10-CM | POA: Insufficient documentation

## 2023-11-30 DIAGNOSIS — L600 Ingrowing nail: Secondary | ICD-10-CM | POA: Insufficient documentation

## 2023-11-30 DIAGNOSIS — L980 Pyogenic granuloma: Secondary | ICD-10-CM | POA: Insufficient documentation

## 2023-11-30 MED ORDER — LIDOCAINE HCL 10 MG/ML (1 %) INJECTION SOLUTION
5.0000 mL | INTRAMUSCULAR | Status: AC
Start: 2023-11-30 — End: 2023-11-30
  Administered 2023-11-30: 50 mg via INTRAMUSCULAR

## 2023-11-30 NOTE — Procedures (Signed)
Dustin Webster [facility]  [address]  [place] [address]  Operated by [facility]  Procedure Note    Name: Dustin Webster MRN:  [id number]   Date: 11/30/2023 DOB:  1956/07/29 (67 y.o.)         Nail Removal    Performed by: Lillia Mountain, DPM  Authorized by: Lillia Mountain, DPM    Consent:     Consent obtained:  Verbal and written    Consent given by:  Patient    Risks discussed:  Bleeding, incomplete removal, infection, pain and permanent nail deformity    Alternatives discussed:  No treatment, alternative treatment and observation  Pre-procedure details:     Skin preparation:  Alcohol and Betadine  Procedure details:     Location:  Foot    Foot location:  R big toe  Anesthesia:     Anesthesia method:  Nerve block    Block needle gauge:  25 G    Block anesthetic:  Lidocaine 1% w/o epi    Block injection procedure:  Anatomic landmarks identified, introduced needle, incremental injection, anatomic landmarks palpated and negative aspiration for blood    Block outcome:  Anesthesia achieved  Nail Removal:     Nail removed:  Partial    Nail side:  Lateral  Ingrown nail:     Nail matrix removed or ablated:  Partial  Post-procedure details:     Dressing:  4x4 sterile gauze, antibiotic ointment and petrolatum-impregnated gauze    Procedure completion:  Tolerated well, no immediate complications  Comments:      Partial avulsion with chemical matricectomy of the lateral nail border of the right hallux. performed via Phenol and EtOH after consent, sterile betadine prep, and local anesthetic digital block of the digit with 5 cc 1% Lidocaine plain . A blunt elevator was used to free the offending nail border and a longitudinal sectioning was performed using an english anvil and [address] blade. The nail border was then inspected to ensure removal of the entire offending nail plate. Four  15 second applications of phenol were applied to the offending nail border and was then followed by an  alcohol rinse. Bacitracin and a dry dressing was then applied. Patient tolerated the procedure well without any complications. Proper instructions were given for post-operative care. Patient was educated about complications and instructed to call the office if there are any problems.         Ludger Nutting, SCRIBE

## 2023-11-30 NOTE — Progress Notes (Signed)
[name]  [address]  [place] [address]  Operated by [facility]    Follow Up       Name: Dustin Webster MRN:  [id number]   Date: 11/30/2023 Age: 68 y.o.     Chief Complaint:    Ingrown Toenail (Right foot ingrown lateral border hallux toenail. Pain /10. FWB. A1C 6.2% 11/06/2023. Toe was broken when a teenager and he's been on meds for gout for quite a while. Had to stop taking due to kidney problems. No tobacco. )      HPI: Dustin Webster is a 68 y.o. male who presents for right hallux ingrown border. Patient rates his pain a 1/10. Patient reports that his lateral border of his hallux is ingrown. Additionally, patient states that he has a hx of gout. Patient verbalizes that he ceased his gout medication due to kidney issues. Patient presents FWB in tennis shoes with no other pedal complaints.     History:  Past Medical History:   Diagnosis Date    Anemia     Atelectasis 12/31/2015    Formatting of this note might be different from the original.  History: post-op  Assessment: bibasilar atelectasis on CXR L>R.  Plan: Encourage ambulation, C&DB, PEP, pain control.      Atrial fibrillation (CMS HCC)     Blockage of coronary artery of heart (CMS HCC)     BPH associated with nocturia     CAD (coronary artery disease)     Celiac disease     Chest pain 05/07/2018    Chronic depression 05/23/2018    Chronic gout 01/02/2016    Was on allopurinol but stopped due to CKD   Never had a flare up       CKD (chronic kidney disease)     COVID-19 08/03/2020    Diet-controlled diabetes mellitus (CMS HCC) 05/23/2018    DVT (deep venous thrombosis) (CMS HCC)     Gastroesophageal reflux disease without esophagitis 05/23/2018    Insomnia 05/23/2018    Leg cramps, sleep related 05/23/2018    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  A/P  - Previously poorly controlled  with fentanyl PCA, improved when switched to dilaudid; continue scheduled lidoderm patches and tyenol, prn oxycodone; goal pain score <4.      Pericardial effusion     Pulmonary emboli (CMS HCC)     Sleep apnea          Family Medical History:       Problem Relation (Age of Onset)    Emphysema Father    Heart Attack General Family Hx    Heart Disease Mother, Brother    Hypertension (High Blood Pressure) Mother    No Known Problems Other, Sister, Son, Son    Stroke Mother, Brother            Social History     Socioeconomic History    Marital status: Married     Spouse name: Marylene Land    Number of children: 1   Occupational History    Occupation: Other Comptroller     Comment: Retired   Tobacco Use    Smoking status: Never    Smokeless tobacco: Never   Vaping Use    Vaping status: Never Used   Substance and Sexual Activity    Alcohol use: Not  Currently     Comment: rare    Drug use: Never    Sexual activity: Yes     Partners: Female      Past Surgical History:   Procedure Laterality Date    HX CHOLECYSTECTOMY      HX CORONARY ARTERY BYPASS GRAFT      HX HEART SURGERY  12/2015    HX HERNIA REPAIR      Inguinal     HX LIPOMA RESECTION      Chest    ORIF HIP FRACTURE Left 2024    OTHER SURGICAL HISTORY Left 01/2016    Evacuation of hematoma -Left Atrium  at [facility]     SKIN CANCER EXCISION  06/21/2013    melanoma/ear/[facility]    TRANSURETHRAL MICROWAVE THERAPY  05/24/2018    Dr Evert Kohl         Medications:  Current Outpatient Medications   Medication Sig    alendronate (FOSAMAX) 70 mg Oral Tablet Take 1 Tablet (70 mg total) by mouth Every 7 days for 90 days    amLODIPine (NORVASC) 10 mg Oral Tablet Take 1 Tablet (10 mg total) by mouth Once a day    apixaban (ELIQUIS) 5 mg Oral Tablet Take 1 Tablet (5 mg total) by mouth Twice daily Indications: blood clot in a deep vein of the extremities    calamine Lotion Apply topically Three times a day as needed (Patient not taking: Reported on 11/29/2023)    Calcium Carbonate (OS-CAL) 650  mg calcium (1,625 mg) Oral Tablet Every evening     Cholecalciferol, Vitamin D3, 50 mcg (2,000 unit) Oral Capsule Take by mouth    citalopram (CELEXA) 10 mg Oral Tablet Take 1 Tablet (10 mg total) by mouth Once a day    dapagliflozin propanediol (FARXIGA) 10 mg Oral Tablet Take 1 Tablet (10 mg total) by mouth Once a day Indications: type 2 diabetes mellitus    evolocumab (REPATHA SURECLICK) 140 mg/mL Subcutaneous Pen Injector Inject 1 mL (140 mg total) under the skin Every 14 days    famotidine (PEPCID) 40 mg Oral Tablet TAKE 1 TABLET (40 MG TOTAL) BY MOUTH TWICE DAILY INDICATIONS: GASTROESOPHAGEAL REFLUX DISEASE    fenofibrate nanocrystallized (TRICOR) 145 mg Oral Tablet TAKE 1 TABLET (145 MG TOTAL) BY MOUTH EVERY MORNING BEFORE BREAKFAST    ferrous sulfate (FERATAB) 324 mg (65 mg iron) Oral Tablet, Delayed Release (E.C.) Take 1 Tablet (324 mg total) by mouth    fluticasone propionate (FLONASE) 50 mcg/actuation Nasal Spray, Suspension Administer 1 Spray into each nostril Once a day Indications: inflammation of the nose due to an allergy    folic acid (FOLVITE) 1 mg Oral Tablet Take 1 Tablet (1 mg total) by mouth Once a day    KRILL OIL ORAL Take by mouth    losartan (COZAAR) 25 mg Oral Tablet Take 1 Tablet (25 mg total) by mouth Once a day Indications: high blood pressure    LUTEIN ORAL Take by mouth    melatonin 1 mg Oral Tablet As directed    metoprolol succinate (TOPROL-XL) 25 mg Oral Tablet Sustained Release 24 hr Take 1 Tablet (25 mg total) by mouth Once a day Indications: high blood pressure    multivit-min/ferrous fumarate (MULTI VITAMIN ORAL) Take 1 Tablet by mouth Once a day    nitroGLYCERIN (NITROSTAT) 0.4 mg Sublingual Tablet, Sublingual Place 1 Tablet (0.4 mg total) under the tongue Every 5 minutes as needed for Chest pain for 3 doses over 15 minutes  nystatin (NYSTOP) 100,000 unit/gram Powder Apply topically Twice daily    silodosin (RAPAFLO) 8 mg Oral Capsule TAKE 1 CAPSULE (8 MG TOTAL) BY MOUTH  ONCE A DAY     Allergies:  Allergies   Allergen Reactions    Gluten      Celiac disease    Statin [Atorvastatin] Myalgia     All statin medications       ROS:  Review of systems negative except noted in HPI     PE:  Vitals:    11/30/23 0751   BP: 122/82   Weight: 103 kg (227 lb)   Height: 1.892 m (6' 2.49")   BMI: 28.76       Vascular:   Dorsalis pedis and posterior tibial pulses palpable right.  Capillary Fill time < 3 seconds to digits 1-5 right.  Skin temperature warm to warm tibial tuberosity to the digits right.  Hair growth present.  No edema.   No varicosities.     Neurological:   Intact vibratory sensation at the hallux IPJ right .  Intact protective sensation right via SWMF.     Dermatological:   Skin appears well hydrated and supple.   Good color, texture, turgor.   No open lesions present.   No callosities present.   Webspaces clean and dry 1-4 right.   Nails 1-5 bilaterally appear normal.  Skin:   No skin rash, subcutaneous nodules, lesions or ulcers observed right LE  Skin is warm and dry with normal turgor   No interdigital maceration right    Incurvated nail later border of right hallux, pain with and without palpation to the nail border.  No acute signs of infection, no drainage no residual across noted.  Granuloma noted lateral border.     Musculoskeletal/Orthopaedic:   Structurally within normal limits.  +5/5 muscle strength Dorsiflexion, Plantarflexion, Inversion, Eversion right.  ROM of the 1st MTP joint is full right.    ROM of the MTJ/STJ is full without pain or crepitus right.    Ankle joint ROM is normal right.    Imaging Studies and Procedures:    11420 - EXC BENIGN LESION 0.5CM OR LESS; INCLUDING MARGINS, EXCEPT SKIN TAG/SCALP/NECK/HANDS/FEET/GENITALIA (AMB ONLY)     Performed by: Lillia Mountain, DPM  Authorized by: Lillia Mountain, DPM    Time Out:     Immediately before the procedure, a time out was called:  Yes    Patient verified:  Yes    Procedure Verified:  Yes    Site Verified:   Yes  Documentation:      Using aseptic technique, tissue nippers were used to excise proliferative granulation vascular tissue from the lateral nail border.  Area was then cauterized using silver nitrate stick.  Patient tolerated procedure well.       Nail Removal     Performed by: Lillia Mountain, DPM  Authorized by: Lillia Mountain, DPM    Consent:     Consent obtained:  Verbal and written    Consent given by:  Patient    Risks discussed:  Bleeding, incomplete removal, infection, pain and permanent nail deformity    Alternatives discussed:  No treatment, alternative treatment and observation  Pre-procedure details:     Skin preparation:  Alcohol and Betadine  Procedure details:     Location:  Foot    Foot location:  R big toe  Anesthesia:     Anesthesia method:  Nerve block    Block needle gauge:  25 G  Block anesthetic:  Lidocaine 1% w/o epi    Block injection procedure:  Anatomic landmarks identified, introduced needle, incremental injection, anatomic landmarks palpated and negative aspiration for blood    Block outcome:  Anesthesia achieved  Nail Removal:     Nail removed:  Partial    Nail side:  Lateral  Ingrown nail:     Nail matrix removed or ablated:  Partial  Post-procedure details:     Dressing:  4x4 sterile gauze, antibiotic ointment and petrolatum-impregnated gauze    Procedure completion:  Tolerated well, no immediate complications  Comments:      Partial avulsion with chemical matricectomy of the lateral nail border of the right hallux. performed via Phenol and EtOH after consent, sterile betadine prep, and local anesthetic digital block of the digit with 5 cc 1% Lidocaine plain . A blunt elevator was used to free the offending nail border and a longitudinal sectioning was performed using an english anvil and 6[address] blade. The nail border was then inspected to ensure removal of the entire offending nail plate. Four  15 second applications of phenol were applied to the offending nail border  and was then followed by an alcohol rinse. Bacitracin and a dry dressing was then applied. Patient tolerated the procedure well without any complications. Proper instructions were given for post-operative care. Patient was educated about complications and instructed to call the office if there are any problems.     Assessment and Plan:    ICD-10-CM    1. Ingrown toenail  L60.0 Nail Removal     lidocaine 1% injection      2. Pain of toe of right foot  M79.674       3. Pyogenic granuloma  L98.0 11420 - EXC BENIGN LESION 0.5CM OR LESS; INCLUDING MARGINS, EXCEPT SKIN TAG/SCALP/NECK/HANDS/FEET/GENITALIA (AMB ONLY)           A comprehensive history and physical examination were preformed. The patient was educated on clinical and radiographic findings, diagnosis and treatment plans. Patient state that he understands all that has been explained and all questions were answered to his apparent satisfaction.      Discussed with patient etiology of ingrown toenails; causes including genetics, direct trauma to the nail, fungal infection, or incorrect clipping of toenail.  Discussed with patient conservative and procedural choices for ingrown toenails.  Conservative treatment including not doing anything with the nail, or apply antibiotic cream to the edge.  Discussed procedural choices for ingrown toenails which involves a partial nail avulsion.  Discussed with patient chemical matrixectomy in order to destroy the nail root with a 95% chance that ingrown does not grow back.  Patient elects for ingrown toenail procedure.     Procedure note in progress note.    Dispensed to patient postop protocol instructions as well as wound care dressing for procedural site.  Advised patient to follow instructions in order than infection to procedural site.  Discussed with the patient to not air out or to poor peroxide on the area.  Discussed with the patient to send in a picture to MyChart at 2 week margin for evaluation of procedure  site.    Discussed with the patient etiology of granulomas associated with the ingrown toenails.  Discussed with the patient that this is proliferative granulation tissue due to the onset of ingrown toenail and infection.  Discussed with the patient that this area does need to be resected and in addition to the ingrown toenail.  Granuloma procedure performed.  Procedure  note in progress note.    Follow Up:  Return for send picture via mychart at 2 week margin.    I am scribing for, and in the presence of, Dr. Faythe Casa for services provided on 11/30/2023.  Ludger Nutting, SCRIBE     [place], [place]    I personally performed the services described in this documentation, as scribed  in my presence, and it is both accurate  and complete.    [place], DPM     [place], [place]

## 2023-11-30 NOTE — Procedures (Signed)
[name]  [address]  [place] [address]  Operated by [facility]  Procedure Note    Name: ARSAL OHLER MRN:  [id number]   Date: 11/30/2023 DOB:  [birth year] (68 y.o.)         11420 - EXC BENIGN LESION 0.5CM OR LESS; INCLUDING MARGINS, EXCEPT SKIN TAG/SCALP/NECK/HANDS/FEET/GENITALIA (AMB ONLY)    Performed by: Lillia Mountain, DPM  Authorized by: Lillia Mountain, DPM    Time Out:     Immediately before the procedure, a time out was called:  Yes    Patient verified:  Yes    Procedure Verified:  Yes    Site Verified:  Yes  Documentation:      Using aseptic technique, tissue nippers were used to excise proliferative granulation vascular tissue from the lateral nail of the right hallux border.  Area was then cauterized using silver nitrate stick.  Patient tolerated procedure well.       Lillia Mountain, [place]

## 2023-12-08 ENCOUNTER — Encounter (HOSPITAL_BASED_OUTPATIENT_CLINIC_OR_DEPARTMENT_OTHER): Payer: Self-pay

## 2023-12-08 NOTE — Nursing Note (Signed)
12/08/2023          - Patient Dustin Webster Was Ordered a Holter Monitor.         - Monitor results Was Routed to Dr. Marlene Bast For Interpretation.               Willaim Rayas

## 2023-12-11 LAB — 7 DAY EXTENDED HOLTER MONITOR
Heart rate (average): 68 {beats}/min
Isolated SVE count: 762 episodes
Isolated VE Counts: 329 episodes
Longest supraventricular tachycardia episode - duration: 1.3 s
Longest supraventricular tachycardia episode - heart rate (: 167 {beats}/min
Longest supraventricular tachycardia episode - number of be: 4 beats
SVE Couplets Counts: 12 episodes
SVE Triplets Counts: 7 episodes
Supraventricular tachycardia - heart rate (average): 167 {beats}/min
Supraventricular tachycardia - number of episodes: 1
Supraventricular tachycardia with fastest heart rate - dura: 1.3 s
Supraventricular tachycardia with fastest heart rate - hear: 167 {beats}/min
Supraventricular tachycardia with fastest heart rate - numb: 4 beats

## 2023-12-12 ENCOUNTER — Ambulatory Visit (INDEPENDENT_AMBULATORY_CARE_PROVIDER_SITE_OTHER): Payer: Medicare Other | Admitting: Internal Medicine

## 2023-12-12 ENCOUNTER — Other Ambulatory Visit: Payer: Self-pay

## 2023-12-12 ENCOUNTER — Encounter (INDEPENDENT_AMBULATORY_CARE_PROVIDER_SITE_OTHER): Payer: Self-pay | Admitting: Internal Medicine

## 2023-12-12 VITALS — BP 107/72 | HR 80 | Ht 75.0 in | Wt 229.7 lb

## 2023-12-12 DIAGNOSIS — N1831 Chronic kidney disease, stage 3a (CMS HCC): Secondary | ICD-10-CM

## 2023-12-12 DIAGNOSIS — M810 Age-related osteoporosis without current pathological fracture: Secondary | ICD-10-CM

## 2023-12-12 DIAGNOSIS — E1122 Type 2 diabetes mellitus with diabetic chronic kidney disease: Secondary | ICD-10-CM

## 2023-12-12 DIAGNOSIS — E611 Iron deficiency: Secondary | ICD-10-CM

## 2023-12-12 DIAGNOSIS — I129 Hypertensive chronic kidney disease with stage 1 through stage 4 chronic kidney disease, or unspecified chronic kidney disease: Secondary | ICD-10-CM

## 2023-12-12 NOTE — Patient Instructions (Signed)
THE LIST OF MEDICATIONS TO AVOID     Aspirin  Anacin, Ascriptin, Bayer, Bufferin, Ecotrin, Excedrin (not more than325 mg/day)   Choline and magnesium salicylates  CMT,Tricosal, Trilisate   Choline salicylate Arthropan   Celecoxib  Celebrex   Diclofenac potassium Cataflam   Diclofenac sodium  Voltaren, Voltaren XR   Diclofenac sodium with misoprostol  Arthrotec   Diflunisal Dolobid   Etodolac  Lodine, Lodine XL   Fenoprofen calcium Nalfon   Flurbiprofen Ansaid   Ibuprofen Advil, Motrin, Motrin IB, Nuprin   Indomethacin  Indocin, Indocin SR   Ketorolac Toradol   Ketoprofen Actron,Orudis, Orudis KT, Oruvail   Magnesium salicylate  Arthritab, Bayer Select, Doan's Pills, Magan, Mobidin, Mobogesic   Meclofenamate sodium Meclomen   Mefenamic acid Ponstel   Meloxicam  Mobic   Nabumetone Relafen   Naproxen  Naprosyn, Naprelan   Naproxen sodium  Aleve, Anaprox   Oxaprozin  Daypro   Piroxicam Feldene   Rofecoxib Vioxx   Salsalate Amigesic, Anaflex 750, Disalcid, Marthritic, Mono-Gesic, Salflex, Salsitab   Sodium salicylate  various generics   Sulindac Clinoril   Tolmetin sodium  Tolectin   Valdecoxib Bextra    LOW-SODIUM DIET    A low-sodium (salt) diet may help prevent build up of extra water in your body.  This may be for high blood pressure, heart failure, kidney disease of other conditions in which swelling or fluid retention can occur.  Even if you take a pill for blood pressure or a water pill (diuretic) to remove fluid, it is still important to have less salt in your diet.  If you follow this diet strictly and avoid processed foods, the sodium content will be about 1500 mg/day. This will allow you to have about 1/8 tsp table salt to season your food. Use "lite" salt and you may have 1/4 tsp.    YOU SHOULD USUALLY AVOID THESE ITEMS:  Salt - 1/4 teaspoon of table salt has almost 600 milligrams sodium.  Processed foods- salt is added in large amounts to some regular foods. Examples are:   canned foods -  soups, stews,  sauces, gravy mixes, and some vegetables  frozen foods - dinners, entrees, vegetables with sauces  snack foods - salted chips, popcorn, pretzels, pork rinds, and crackers  packaged starch foods - seasoned noodle or rice dishes, stuffing mix, macaroni and cheese dinner  instant cooking foods to which you add hot water and stir - potatoes, cereals, noodles, rice, etc.  Mixes - cornbread, biscuit, cake, pudding  meats and cheeses  deli or lunch meats -  bologna, ham, [place], roast beef, etc.  cured or smoked meats -  corned beef, sausage of any kind (patty, link, Kielbasa, [place], wieners or hot dogs), bacon  canned meats - potted meats, spreads, Spam, [place] sausage, etc.  cheeses - read labels and avoid those with more than 140 mg sodium per serving; examples are American cheese, Velveeta, [employer], etc.    Condiments, Sauces and Seasonings  mustard, ketchup, salad dressings, bouillon cubes or granules  sauces - Worcestershire, barbecue, pizza, chili, steak, soy or horseradish sauce  meat tenderizer, monosodium glutamate  any seasoning that has "salt" in the name or on the label;  avoid celery salt, garlic salt and onion salt; however, it is okay to have garlic or onion powder or flakes  read labels carefully - lemon pepper often has salt  pickles and olives      What can you use to season your food?  Tart  flavor - try lemon or lime juice, vinegar  Hot flavor - peppers are low in sodium; hot sauce has salt, but if you use just a drop or two it will not add up to much  Herbs and spices - onions, garlic, salt-free seasonings like Mrs. Dash    Recommended Foods     Food Groups  Servings  Best Choices    Milk/Dairy            2 servings a day     One (1) serving has about 150     milligrams (mg)  Milk, all types (1 cup)  Yogurt (6 oz)  Low sodium cheese (1 oz)  Hard cheese like cheddar, Swiss, [employer], mozzarella (1 oz)    Meat/Protein        3 servings a day    One (1) serving has about     60 mg Plain meats,  fish and poultry are very low in sodium (3 oz cooked meat)  Eggs (1 large)     Read all meat labels! Many raw meats   now have added broth with sodium salts   that make the meat hold moisture and   taste juicy and tender. Choose a product   with less than 5% of the DV for sodium.    Vegetables & Fruit   Only (1) serving has only    about 10 mg All unsalted fresh, frozen,        or canned   Tomato or vegetable juice canned without salt  Fresh, frozen, canned fruits  Fruit juices    Bread and Cereals     5 servings a day       One (1) serving has about          150 mg Bread, roll, pita, tortilla, crackers (1 oz)  These foods have almost no sodium and do not need to be counted in the 5 servings per day:           > shredded or puffed               wheat, puffed rice           >cooked cereals - regular             or quick           >plain rice or pasta           >yeast bread made at              home with no salt  Other ready-to-eat cereals with more than 5% DV for sodium - serving size is 1/2 cup (1 cup = 2 of your 5 servings/day)    Others    Use small amounts Ice cream (1/2 cup)  Sherbet (1/2 cup)  Homemade pie made without salt  Homemade pudding (count as milk serving)  Unsalted nuts  Butter or margarine (1 tsp); buy the unsalted kind as it has almost no sodium       Foods Not Recommended       FOOD GROUPS      LIMIT OR AVOID    Milk/Dairy  Buttermilk (1 cup)  Cottage cheese (1/2 cup; wash it under cold water in a sieve to remove most of the sodium  Cheese spread (1 oz)  Processed cheese (1 oz)    Meat/Protein Lunch meat (1 oz)  Sausage (1 oz)  Deli ham (1 oz)  Bacon ( 1 medium strip)  [place] dark meat (4 oz)    Vegetables Tomato juice (6 oz)  Sauerkraut (1/2 cup)  Pickled vegetables    Bread and Cereals Instant hot cereals (1 cup cooked instant oatmeal - 420 mg sodium)  Quick breads made with baking powder or baking soda (biscuit, pancake, waffle, uffin, etc)  Crackers with salted tops    Others Commercial dessert  mixes  Cake  Pie    FARXIGA is indicated:     to reduce the risk of sustained eGFR decline, end-stage kidney disease, cardiovascular death, and hospitalization for heart failure in adults with chronic kidney disease at risk of progression     to reduce the risk of cardiovascular death and hospitalization for heart failure in adults with heart failure (NYHA class II-IV) with reduced ejection fraction     to reduce the risk of hospitalization for heart failure in adults with type 2 diabetes mellitus and either established cardiovascular (CV) disease or multiple CV risk factors     as an adjunct to diet and exercise to improve glycemic control in adults with type 2 diabetes mellitus     INDICATIONS AND LIMITATIONS OF USE     FARXIGA is indicated:     to reduce the risk of sustained eGFR decline, end-stage kidney disease, cardiovascular death, and hospitalization for heart failure in adults with chronic kidney disease at risk of progression     to reduce the risk of cardiovascular death and hospitalization for heart failure in adults with heart failure (NYHA class II-IV) with reduced ejection fraction     to reduce the risk of hospitalization for heart failure in adults with type 2 diabetes mellitus and either established cardiovascular (CV) disease or multiple CV risk factors     as an adjunct to diet and exercise to improve glycemic control in adults with type 2 diabetes mellitus     FARXIGA is not recommended for patients with type 1 diabetes mellitus. It may increase the risk of diabetic ketoacidosis in these patients.     Marcelline Deist is not recommended for use to improve glycemic control in adults with type 2 diabetes mellitus with an eGFR less than 45 mL/min/1.73 m2. Marcelline Deist is likely to be ineffective in this setting based upon its mechanism of action.     Marcelline Deist is not recommended for the treatment of chronic kidney disease in patients with polycystic kidney disease or patients requiring or with a recent history of  immunosuppressive therapy for kidney disease. Marcelline Deist is not expected to be effective in these populations.          MPORTANT SAFETY INFORMATION FOR FARXIGA     Contraindications     Prior serious hypersensitivity reaction to [facility]     Patients on dialysis     Warnings and Precautions     Ketoacidosis in Diabetes Mellitus has been reported in patients with type 1 and type 2 diabetes receiving FARXIGA. In placebo-controlled trials of patients with type 1 diabetes, the risk of ketoacidosis was increased in patients who received SGLT2 inhibitors compared to patients who received placebo. Some cases were fatal. Assess patients who present with signs and symptoms of metabolic acidosis for ketoacidosis, regardless of blood glucose level. If suspected, discontinue FARXIGA, evaluate and treat promptly. Before initiating FARXIGA, consider risk factors for ketoacidosis. Patients on FARXIGA may require monitoring and temporary discontinuation in situations known to predispose to ketoacidosis     Volume Depletion: FARXIGA can cause intravascular volume depletion which may manifest as symptomatic hypotension or acute  transient changes in creatinine. Acute kidney injury requiring hospitalization and dialysis has been reported in patients with type 2 diabetes receiving SGLT2 inhibitors, including FARXIGA. Patients with impaired renal function (eGFR less than 60 mL/min/1.73 m2), elderly patients, or patients on loop diuretics may be at increased risk for volume depletion or hypotension. Before initiating FARXIGA in these patients, assess volume status and renal function. After initiating therapy, monitor for signs and symptoms of hypotension and renal function     Urosepsis and Pyelonephritis: SGLT2 inhibitors increase the risk for urinary tract infections (UTIs) and serious UTIs have been reported with FARXIGA. Evaluate for signs and symptoms of UTIs and treat promptly     Hypoglycemia: FARXIGA can increase the risk of  hypoglycemia when coadministered with insulin and insulin secretagogues. Consider lowering the dose of these agents when coadministered with FARXIGA     Necrotizing Fasciitis of the Perineum (Fournier's Gangrene): Rare but serious, life-threatening cases have been reported in patients with diabetes mellitus receiving SGLT2 inhibitors including FARXIGA. Cases have been reported in females and males. Serious outcomes have included hospitalization, surgeries, and death. Assess patients presenting with pain or tenderness, erythema, swelling in the genital or perineal area, along with fever or malaise. If suspected, institute prompt treatment and discontinue FARXIGA     Genital Mycotic Infections: FARXIGA increases the risk of genital mycotic infections, particularly in patients with prior genital mycotic infections. Monitor and treat appropriately     Adverse Reactions     In a pool of 12 placebo-controlled studies, the most common adverse reactions (?5%) associated with FARXIGA 5 mg, 10 mg, and placebo respectively were male genital mycotic infections (8.4% vs 6.9% vs 1.5%), nasopharyngitis (6.6% vs 6.3% vs 6.2%), and urinary tract infections (5.7% vs 4.3% vs 3.7%).     Use in Specific Populations     Pregnancy: Advise females of potential risk to a fetus especially during the second and third trimesters     Lactation: Marcelline Deist is not recommended when breastfeeding     cted to be effective in these populations.     DOSING     To improve glycemic control, the recommended starting dose is 5 mg orally once daily.   Dose can be increased to 10 mg orally once daily for additional glycemic control.     For all other indications, the recommended dose is 10 mg orally once daily.       1. Drink 64 ounces of water in a day minimal . Avoid sugary drinks like pop.  Low salt diet, < 2 gms daily.  2. Avoid NSAIDs, IV dye or over the counter herbal medications   3. Please check your BP at home and keep log prior to next visit if  possible   4. Let the office know with any change in your medications or health status

## 2023-12-12 NOTE — Progress Notes (Addendum)
Name: Dustin Webster MRN:  [id number]   Date: 12/12/2023 Age: 68 y.o.      NAME: YANIEL LIMBAUGH  AGE: 68 y.o.  MRN: [id number]  DATE:12/12/2023    REASON FOR CONSULT: Low Iron (F/U//Patient would like to discuss blood work results )       HISTORY OF PRESENTING ILLNESS:  Dustin Webster is a 68 y.o. y/o male presenting to establish care for CKD3b.      Subjective - interim had fall and fractured hip diagnosed with osteoporosis on fosamax   No n/v/d/f/c. + hesitancy + nocturia secondary to BPH   Cp off and on all the time with some exertional sob  . Follows with cardiology     MEDICATION LIST:  Current Outpatient Medications   Medication Sig    alendronate (FOSAMAX) 70 mg Oral Tablet Take 1 Tablet (70 mg total) by mouth Every 7 days for 90 days    amLODIPine (NORVASC) 10 mg Oral Tablet Take 1 Tablet (10 mg total) by mouth Once a day    apixaban (ELIQUIS) 5 mg Oral Tablet Take 1 Tablet (5 mg total) by mouth Twice daily Indications: blood clot in a deep vein of the extremities    calamine Lotion Apply topically Three times a day as needed    Calcium Carbonate (OS-CAL) 650 mg calcium (1,625 mg) Oral Tablet Every evening     Cholecalciferol, Vitamin D3, 50 mcg (2,000 unit) Oral Capsule Take by mouth    citalopram (CELEXA) 10 mg Oral Tablet Take 1 Tablet (10 mg total) by mouth Once a day    dapagliflozin propanediol (FARXIGA) 10 mg Oral Tablet Take 1 Tablet (10 mg total) by mouth Once a day Indications: type 2 diabetes mellitus    evolocumab (REPATHA SURECLICK) 140 mg/mL Subcutaneous Pen Injector Inject 1 mL (140 mg total) under the skin Every 14 days    famotidine (PEPCID) 40 mg Oral Tablet TAKE 1 TABLET (40 MG TOTAL) BY MOUTH TWICE DAILY INDICATIONS: GASTROESOPHAGEAL REFLUX DISEASE    fenofibrate nanocrystallized (TRICOR) 145 mg Oral Tablet TAKE 1 TABLET (145 MG TOTAL) BY MOUTH EVERY MORNING BEFORE BREAKFAST    ferrous sulfate (FERATAB) 324 mg (65 mg iron) Oral Tablet, Delayed Release (E.C.) Take 1 Tablet (324 mg total) by  mouth    fluticasone propionate (FLONASE) 50 mcg/actuation Nasal Spray, Suspension Administer 1 Spray into each nostril Once a day Indications: inflammation of the nose due to an allergy    folic acid (FOLVITE) 1 mg Oral Tablet Take 1 Tablet (1 mg total) by mouth Once a day    KRILL OIL ORAL Take by mouth    losartan (COZAAR) 25 mg Oral Tablet Take 1 Tablet (25 mg total) by mouth Once a day Indications: high blood pressure    LUTEIN ORAL Take by mouth    melatonin 1 mg Oral Tablet As directed    metoprolol succinate (TOPROL-XL) 25 mg Oral Tablet Sustained Release 24 hr Take 1 Tablet (25 mg total) by mouth Once a day Indications: high blood pressure    multivit-min/ferrous fumarate (MULTI VITAMIN ORAL) Take 1 Tablet by mouth Once a day    nitroGLYCERIN (NITROSTAT) 0.4 mg Sublingual Tablet, Sublingual Place 1 Tablet (0.4 mg total) under the tongue Every 5 minutes as needed for Chest pain for 3 doses over 15 minutes    nystatin (NYSTOP) 100,000 unit/gram Powder Apply topically Twice daily    silodosin (RAPAFLO) 8 mg Oral Capsule TAKE 1 CAPSULE (8 MG TOTAL) BY MOUTH ONCE  A DAY       ALLERGIES:  Allergies   Allergen Reactions    Gluten      Celiac disease    Statin [Atorvastatin] Myalgia     All statin medications       PAST MEDICAL HISTORY:  Past Medical History:   Diagnosis Date    Anemia     Atelectasis 12/31/2015    Formatting of this note might be different from the original.  History: post-op  Assessment: bibasilar atelectasis on CXR L>R.  Plan: Encourage ambulation, C&DB, PEP, pain control.      Atrial fibrillation (CMS HCC)     Blockage of coronary artery of heart (CMS HCC)     BPH associated with nocturia     CAD (coronary artery disease)     Celiac disease     Chest pain 05/07/2018    Chronic depression 05/23/2018    Chronic gout 01/02/2016    Was on allopurinol but stopped due to CKD   Never had a flare up       CKD (chronic kidney disease)     COVID-19 08/03/2020    Diet-controlled diabetes mellitus (CMS HCC)  05/23/2018    DVT (deep venous thrombosis) (CMS HCC)     Gastroesophageal reflux disease without esophagitis 05/23/2018    Insomnia 05/23/2018    Leg cramps, sleep related 05/23/2018    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  A/P  - Previously poorly controlled with fentanyl PCA, improved when switched to dilaudid; continue scheduled lidoderm patches and tyenol, prn oxycodone; goal pain score <4.      Pericardial effusion     Pulmonary emboli (CMS HCC)     Sleep apnea            PAST SURGICAL HISTORY:  Past Surgical History:   Procedure Laterality Date    HX CHOLECYSTECTOMY      HX CORONARY ARTERY BYPASS GRAFT      HX HEART SURGERY  12/2015    HX HERNIA REPAIR      Inguinal     HX LIPOMA RESECTION      Chest    ORIF HIP FRACTURE Left 2024    OTHER SURGICAL HISTORY Left 01/2016    Evacuation of hematoma -Left [facility]  at [facility]     SKIN CANCER EXCISION  06/21/2013    melanoma/ear/[facility]    TRANSURETHRAL MICROWAVE THERAPY  05/24/2018    Dr Evert Kohl           FAMILY HISTORY:  Family Medical History:       Problem Relation (Age of Onset)    Emphysema Father    Heart Attack General Family Hx    Heart Disease Mother, Brother    Hypertension (High Blood Pressure) Mother    No Known Problems Other, Sister, Son, Son    Stroke Mother, Brother              SOCIAL HISTORY:  Social History     Tobacco Use    Smoking status: Never    Smokeless tobacco: Never   Substance Use Topics    Alcohol use: Not Currently     Comment: rare        REVIEW OF SYSTEMS:  Review of Systems   Constitutional: Negative.    HENT: Negative.     Eyes: Negative.    Respiratory: Negative.  Cardiovascular: Negative.    Gastrointestinal:  Positive for diarrhea.   Genitourinary: Negative.    Musculoskeletal: Negative.    Skin: Negative.    Neurological: Negative.    Endo/Heme/Allergies: Negative.    Psychiatric/Behavioral: Negative.            PHYSICAL EXAM:     Vitals:    12/12/23 1449   BP: 107/72   Pulse: 80   SpO2: 95%   Weight: 104 kg (229 lb 11.5 oz)   Height: 1.905 m (6\' 3" )   BMI: 28.71     # GENERAL: Alert, no acyte distress  # EYE: PERRLA, EOMI  # HEENT: Normocephalic, moist oral mucosa  # CARDIOVASCULAR: S1, S2 normal, no murmurs  # RESPIRATORY: Chest clear, no rales  # GASTROINTESTINAL: Soft, non tender  # GENITOURINARY: Deferred  # SKIN: Warm, dry  # PSYCHIATRIC: Cooperative, appropriate mood and affect    LABS:  There are no exam notes on file for this visit.    BASIC METABOLIC PANEL  Lab Results   Component Value Date    SODIUM 140 11/06/2023    SODIUM 139 11/06/2023    POTASSIUM 4.6 11/06/2023    POTASSIUM 4.4 11/06/2023    CHLORIDE 102 11/06/2023    CHLORIDE 102 11/06/2023    CO2 24 11/06/2023    CO2 24 11/06/2023    ANIONGAP 14 11/06/2023    ANIONGAP 13 11/06/2023    BUN 32 (H) 11/06/2023    BUN 32 (H) 11/06/2023    CREATININE 1.39 (H) 11/06/2023    CREATININE 1.47 (H) 11/06/2023    BUNCRRATIO 23 11/06/2023    BUNCRRATIO 22 11/06/2023    GFR 55 (L) 11/06/2023    GFR 52 (L) 11/06/2023    CALCIUM 9.7 11/06/2023    CALCIUM 9.6 11/06/2023    GLUCOSE 4+ (A) 07/24/2023    GLUCOSENF 162 (H) 11/06/2023    GLUCOSENF 162 (H) 11/06/2023          CBC  Diff   Lab Results   Component Value Date/Time    WBC 5.0 11/06/2023 07:29 AM    HGB 15.0 11/06/2023 07:29 AM    HCT 47.2 11/06/2023 07:29 AM    PLTCNT 251 11/06/2023 07:29 AM    RBC 5.36 11/06/2023 07:29 AM    MCV 88.1 11/06/2023 07:29 AM    MCHC 31.8 11/06/2023 07:29 AM    MCH 28.0 11/06/2023 07:29 AM    RDW 14.0 06/10/2021 11:16 AM    MPV 11.6 11/06/2023 07:29 AM    Lab Results   Component Value Date/Time    PMNS 40.6 11/06/2023 07:29 AM    LYMPHOCYTES 34 06/10/2021 11:16 AM    EOSINOPHIL 6 06/10/2021 11:16 AM    MONOCYTES 10.2 11/06/2023 07:29 AM    BASOPHILS 1.4 11/06/2023 07:29 AM    BASOPHILS <0.10 11/06/2023 07:29 AM    PMNABS 2.03 11/06/2023 07:29 AM    LYMPHSABS 2.14 11/06/2023 07:29  AM    EOSABS 0.20 11/06/2023 07:29 AM    MONOSABS 0.51 11/06/2023 07:29 AM    BASABS 0.04 01/14/2020 12:08 PM            PTH   Date Value Ref Range Status   11/06/2023 32.3 14.2 - 75.2 pg/mL Final       No results found for: "VITD25HYDRO", "VITD125DIHY"    FERRITIN   Date Value Ref Range Status   11/06/2023 52 18 - 464 ng/mL Final     IRON (TRANSFERRIN) SATURATION   Date  Value Ref Range Status   11/06/2023 22 20 - 55 % Final     IRON   Date Value Ref Range Status   11/06/2023 91 49 - 181 ug/dL Final       PROTEIN   Date Value Ref Range Status   07/24/2023 Not Detected Not Detected mg/dL Final   16/08/9603 Negative Negative mg/dL Final   54/07/8118 Not Detected Not Detected mg/dL Final     MICROALBUMIN RANDOM URINE   Date Value Ref Range Status   07/24/2023 <0.5 No reference intervals are established mg/dL Final   14/78/2956 <2.1 0.0 - 1.7 mg/dL Final       IMAGING:    Recent Results (from the past [id number] hour(s))   US KIDNEY    Collection Time: 08/04/23 11:54 AM    Narrative    EXAMINATION: Ultrasound kidneys    HISTORY: Chronic kidney disease.    Real-time imaging of the kidneys was performed. Right kidney measures 11.5 cm in length. Left kidney measures 11.0 cm. Echotexture and cortical thickness are grossly normal. There is slight cortical lobulation on the left, suggesting fetal lobation.    There is no hydronephrosis on either side. No calculi are appreciated. Small bilateral renal cysts are present, measuring approximately 1.8 cm on the right and 1 cm on the left.      Impression    1. Small bilateral renal cysts.  2. Otherwise grossly normal ultrasound of the kidneys.      Radiologist location ID: [id number]         Recent Results (from the past [id number] hour(s))   CT ABDOMEN PELVIS W IV CONTRAST    Collection Time: 03/27/22 12:29 AM    Narrative    CLINICAL DATA:  Polytrauma, blunt [id number].  Fall.    EXAM:  CT CHEST, ABDOMEN, AND PELVIS WITH CONTRAST    TECHNIQUE:  Multidetector CT imaging of the chest,  abdomen and pelvis was  performed following the standard protocol during bolus  administration of intravenous contrast.    RADIATION DOSE REDUCTION: This exam was performed according to the  departmental dose-optimization program which includes automated  exposure control, adjustment of the mA and/or kV according to  patient size and/or use of iterative reconstruction technique.    CONTRAST:  80mL OMNIPAQUE IOHEXOL 350 MG/ML SOLN    COMPARISON:  03/21/2022    FINDINGS:  CT CHEST FINDINGS    Cardiovascular: Prior CABG. Heart is normal size. Aorta is normal  caliber.    Mediastinum/Nodes: No mediastinal, hilar, or axillary adenopathy.  Trachea and esophagus are unremarkable. Thyroid unremarkable.    Lungs/Pleura: Dependent atelectasis or scarring posteriorly in the  lower lobes. No effusions or pneumothorax. No confluent airspace  opacities.    Musculoskeletal: Chest wall soft tissues are unremarkable. No acute  bony abnormality.    CT ABDOMEN PELVIS FINDINGS    Hepatobiliary: No hepatic injury or perihepatic hematoma. Diffuse  low-density throughout the liver compatible with fatty infiltration.  Prior cholecystectomy.    Pancreas: No focal abnormality or ductal dilatation.    Spleen: No splenic injury or perisplenic hematoma.    Adrenals/Urinary Tract: No adrenal hemorrhage or renal injury  identified. Bladder is unremarkable.    Stomach/Bowel: Normal appendix. Stomach, large and small bowel  grossly unremarkable.    Vascular/Lymphatic: Scattered aortic atherosclerosis. No evidence of  aneurysm or adenopathy.    Reproductive: No visible focal abnormality.    Other: No free fluid or free air.    Musculoskeletal: No acute bony  abnormality.    IMPRESSION:  No acute findings or evidence of significant traumatic injury in the  chest, abdomen or pelvis.    Prior CABG.  Aortic atherosclerosis.    Hepatic steatosis.    These results were called by telephone at the time of interpretation  on 03/27/2022 at 12:31 am to provider  Dr. Violeta Gelinas, who  verbally acknowledged these results.      Electronically Signed    By: Charlett Nose M.D.    On: 03/27/2022 00:36        No results found for this or any previous visit (from the past [id number] hour(s)).      ASSESSMENT:    Problem List Items Addressed This Visit    None          1. Chronic Kidney Disease 3aA1 - recent creatinine on labs done on 11/06/23 1.47 mg/dl with 52 ML/MIN   Etiology likely secondary to diabetic nephropathy , farxiga 10 mg daily. Is on Cozaar 25 mg for reno protective effects. .  08/03/24 - Right kidney measures 11.5 cm in length. Left kidney measures 11.0 cm. Echotexture and cortical thickness are grossly normal. There is slight cortical lobulation on the left, suggesting fetal lobation.There is no hydronephrosis on either side. No calculi are appreciated. Small bilateral renal cysts are present, measuring approximately 1.8 cm on the right and 1 cm on the left.  2.  Stable Electrolytes    3. Stable Acid Base Status    4. Hypertension:  Well controlled on current blood pressure regimen.  Cont on losartan 25 mg daily   Metoprolol - XL 25 mg daily   Amlodipine 10 mg daily     5. No Anemia:  Hemoglobin 15.    6. Metabolic Bone Disease:  Stable metabolic bone profile. Patient has a history of celiac disease and is on calcium replacement  Vitamin D 40 and PTH 32. Osteoporosis ok to take fosamax       PLAN:  1.Continue current medication regiment for CKD3b. Obtain blood work including CBC, CMP, UA, Urine Albumin creatinine ratio, PTH, Mag, Phos, Vitamin D levels today and 6 months later.   2. Continue to monitor labs for electrolyte and acid base status.   3. Continue current antihypertensive regimen  4. Stable metabolic bone disease. Continue to monitor.  5. Follow up in 3 months.  6. Patient encouraged to drink 5--50 ounce of water daily. Encouraged to take low salt, low red meat diet.       FOLLOW UP: No follow-ups on file.     Leonard Downing MD     I have independently  seen and examined the patient on the date of service.  I have discussed the history, presentation, and plan of care in detail with the resident.  I have reviewed the document above for medical content and agree as documented.  Not all grammatical errors, misspellings, transcription misinterpretations, and punctuation errors have been corrected.     Exam:  Cardiovascular: Agree as documented  Respiratory: Agree as documented  Leonard Downing MD

## 2023-12-15 ENCOUNTER — Other Ambulatory Visit (INDEPENDENT_AMBULATORY_CARE_PROVIDER_SITE_OTHER): Payer: Self-pay | Admitting: FAMILY MEDICINE

## 2023-12-15 DIAGNOSIS — I480 Paroxysmal atrial fibrillation: Secondary | ICD-10-CM

## 2023-12-15 DIAGNOSIS — N1831 Chronic kidney disease, stage 3a (CMS HCC): Secondary | ICD-10-CM

## 2023-12-15 MED ORDER — DAPAGLIFLOZIN PROPANEDIOL 10 MG TABLET
10.0000 mg | ORAL_TABLET | Freq: Every day | ORAL | 1 refills | Status: DC
Start: 2023-12-15 — End: 2024-08-21

## 2023-12-15 NOTE — Telephone Encounter (Signed)
Last visit with PCP was 11/10/2023.    Last office visit in the department was 11/10/2023 .  Currently scheduled future appointment in the department is 05/10/2024.    Idelle Leech, LPN, 1/61/0960 , 15:43

## 2023-12-16 ENCOUNTER — Encounter (INDEPENDENT_AMBULATORY_CARE_PROVIDER_SITE_OTHER): Payer: Self-pay

## 2023-12-18 NOTE — Telephone Encounter (Signed)
Please advise. DOS 11-30-2023  Willeen Cass, LPN 11/17/863 78:46

## 2023-12-28 ENCOUNTER — Other Ambulatory Visit: Payer: Self-pay

## 2023-12-28 ENCOUNTER — Ambulatory Visit
Admission: RE | Admit: 2023-12-28 | Discharge: 2023-12-28 | Disposition: A | Discharge status: Home / Self Care | Payer: Medicare Other | Source: Ambulatory Visit | Attending: Cardiovascular Disease | Admitting: Cardiovascular Disease

## 2023-12-28 DIAGNOSIS — I2510 Atherosclerotic heart disease of native coronary artery without angina pectoris: Secondary | ICD-10-CM | POA: Insufficient documentation

## 2023-12-31 ENCOUNTER — Other Ambulatory Visit (INDEPENDENT_AMBULATORY_CARE_PROVIDER_SITE_OTHER): Payer: Self-pay | Admitting: FAMILY MEDICINE

## 2023-12-31 DIAGNOSIS — I361 Nonrheumatic tricuspid (valve) insufficiency: Secondary | ICD-10-CM

## 2023-12-31 DIAGNOSIS — J309 Allergic rhinitis, unspecified: Secondary | ICD-10-CM

## 2023-12-31 LAB — TRANSTHORACIC ECHOCARDIOGRAM - ADULT: EF: 75

## 2024-01-01 NOTE — Telephone Encounter (Signed)
Last scheduled appointment with you was 11/10/2023.    Currently scheduled future appointment is 05/10/2024.    Rx routed as 90 day supply.     [address] [place], [place]  01/01/2024, 09:33

## 2024-01-02 ENCOUNTER — Ambulatory Visit (HOSPITAL_BASED_OUTPATIENT_CLINIC_OR_DEPARTMENT_OTHER): Payer: Self-pay

## 2024-01-09 ENCOUNTER — Other Ambulatory Visit (INDEPENDENT_AMBULATORY_CARE_PROVIDER_SITE_OTHER): Payer: Self-pay | Admitting: FAMILY MEDICINE

## 2024-01-09 DIAGNOSIS — N1831 Chronic kidney disease, stage 3a (CMS HCC): Secondary | ICD-10-CM

## 2024-01-09 DIAGNOSIS — E782 Mixed hyperlipidemia: Secondary | ICD-10-CM

## 2024-01-09 DIAGNOSIS — I10 Essential (primary) hypertension: Secondary | ICD-10-CM

## 2024-01-09 MED ORDER — FENOFIBRATE NANOCRYSTALLIZED 145 MG TABLET
145.0000 mg | ORAL_TABLET | Freq: Every morning | ORAL | 1 refills | Status: DC
Start: 2024-01-09 — End: 2024-08-20

## 2024-01-09 MED ORDER — LOSARTAN 25 MG TABLET
25.0000 mg | ORAL_TABLET | Freq: Every day | ORAL | 1 refills | Status: DC
Start: 2024-01-09 — End: 2024-07-05

## 2024-01-09 NOTE — Telephone Encounter (Signed)
 Last scheduled appointment with you was 11/10/2023.    Currently scheduled future appointment is 05/10/2024.      [address] [place], [place]  01/09/2024, 16:25

## 2024-01-15 ENCOUNTER — Other Ambulatory Visit (INDEPENDENT_AMBULATORY_CARE_PROVIDER_SITE_OTHER): Payer: Self-pay | Admitting: FAMILY MEDICINE

## 2024-01-15 DIAGNOSIS — K219 Gastro-esophageal reflux disease without esophagitis: Secondary | ICD-10-CM

## 2024-01-15 NOTE — Telephone Encounter (Signed)
 Last scheduled appointment with you was 11/10/2023.    Currently scheduled future appointment is 05/10/2024.    [address] [place], [place]  01/15/2024, 11:02

## 2024-01-16 MED ORDER — FAMOTIDINE 40 MG TABLET
40.0000 mg | ORAL_TABLET | Freq: Two times a day (BID) | ORAL | 1 refills | Status: DC
Start: 2024-01-16 — End: 2024-10-01

## 2024-01-25 ENCOUNTER — Encounter (INDEPENDENT_AMBULATORY_CARE_PROVIDER_SITE_OTHER): Payer: Self-pay | Admitting: FAMILY MEDICINE

## 2024-01-25 ENCOUNTER — Other Ambulatory Visit (INDEPENDENT_AMBULATORY_CARE_PROVIDER_SITE_OTHER): Payer: Self-pay | Admitting: FAMILY MEDICINE

## 2024-01-25 ENCOUNTER — Other Ambulatory Visit (INDEPENDENT_AMBULATORY_CARE_PROVIDER_SITE_OTHER): Payer: Self-pay | Admitting: Family Medicine

## 2024-01-25 DIAGNOSIS — N1831 Chronic kidney disease, stage 3a: Secondary | ICD-10-CM

## 2024-01-25 DIAGNOSIS — I10 Essential (primary) hypertension: Secondary | ICD-10-CM

## 2024-01-25 NOTE — Telephone Encounter (Signed)
 Last scheduled appointment with you was 11/10/2023.    Currently scheduled future appointment is 05/10/2024    Rod Mae, MA  01/25/2024, 11:16

## 2024-01-25 NOTE — Progress Notes (Signed)
 Duplicate

## 2024-01-26 ENCOUNTER — Other Ambulatory Visit: Payer: Self-pay

## 2024-01-26 ENCOUNTER — Ambulatory Visit (INDEPENDENT_AMBULATORY_CARE_PROVIDER_SITE_OTHER): Payer: Self-pay

## 2024-01-26 ENCOUNTER — Encounter (HOSPITAL_BASED_OUTPATIENT_CLINIC_OR_DEPARTMENT_OTHER): Payer: Self-pay

## 2024-01-26 VITALS — BP 94/62 | HR 75 | Ht 75.0 in | Wt 224.6 lb

## 2024-01-26 DIAGNOSIS — I480 Paroxysmal atrial fibrillation: Secondary | ICD-10-CM

## 2024-01-26 DIAGNOSIS — Z86718 Personal history of other venous thrombosis and embolism: Secondary | ICD-10-CM

## 2024-01-26 DIAGNOSIS — E782 Mixed hyperlipidemia: Secondary | ICD-10-CM

## 2024-01-26 DIAGNOSIS — I10 Essential (primary) hypertension: Secondary | ICD-10-CM

## 2024-01-26 DIAGNOSIS — E785 Hyperlipidemia, unspecified: Secondary | ICD-10-CM

## 2024-01-26 DIAGNOSIS — I2510 Atherosclerotic heart disease of native coronary artery without angina pectoris: Secondary | ICD-10-CM

## 2024-01-26 MED ORDER — METOPROLOL SUCCINATE ER 25 MG TABLET,EXTENDED RELEASE 24 HR
25.0000 mg | ORAL_TABLET | Freq: Every day | ORAL | 1 refills | Status: DC
Start: 2024-01-26 — End: 2024-01-26

## 2024-01-26 MED ORDER — METOPROLOL SUCCINATE ER 25 MG TABLET,EXTENDED RELEASE 24 HR
25.0000 mg | ORAL_TABLET | Freq: Every day | ORAL | 3 refills | Status: AC
Start: 2024-01-26 — End: ?

## 2024-01-26 NOTE — Progress Notes (Signed)
 [facility]  [address]  [place] [address]  [phone]    Date:   01/26/2024  Name: Dustin Webster  Age: 68 y.o.    REASON FOR VISIT:   Follow Up     HISTORY OF PRESENTING ILLNESS:    Dustin Webster is a 68 y.o. male with cardiac history significant for CAD status post CABG, hypertension, hyperlipidemia, paroxysmal atrial fibrillation, history of deep vein thrombosis/PE and type 2 diabetes. He was last seen in the office on 05/26/23.     The patient presents today for a follow-up appointment. Patient has been doing ok from a cardiac standpoint. Patient does have complaints today of dizziness at times and usually occurs upon standing. Blood pressure today in the office is hypotensive. Patient states he just took his losartan about an hour ago. Patient states he has not been taking blood pressure at home. Patient on metoprolol for palpitations. He remains on Eliquis for thromboembolic protection and denies bleeding complications. Heart rate controlled. Most recent LDL 66. Most recent A1C 6.2. Most recent Echocardiogram reveals ejection fraction 75% with no wall motion abnormalities. Recent Holter monitor showed rare premature atrial and premature ventricular contractions with average heart rate of 68. These results were discussed with the patient. The patient denies chest pain, shortness of breath, palpitations, fatigue, near-syncope, syncope, lower extremity edema, orthopnea or PND.     Current Outpatient Medications   Medication Sig    alendronate (FOSAMAX) 70 mg Oral Tablet Take 1 Tablet (70 mg total) by mouth Every 7 days for 90 days    amLODIPine (NORVASC) 10 mg Oral Tablet Take 1 Tablet (10 mg total) by mouth Once a day    apixaban (ELIQUIS) 5 mg Oral Tablet Take 1 Tablet (5 mg total) by mouth Twice daily Indications: blood clot in a deep vein of the extremities    calamine Lotion Apply topically Three times a day as needed    Calcium Carbonate (OS-CAL) 650 mg calcium  (1,625 mg) Oral Tablet Every evening     Cholecalciferol, Vitamin D3, 50 mcg (2,000 unit) Oral Capsule Take by mouth    citalopram (CELEXA) 10 mg Oral Tablet Take 1 Tablet (10 mg total) by mouth Once a day    dapagliflozin propanediol (FARXIGA) 10 mg Oral Tablet Take 1 Tablet (10 mg total) by mouth Once a day Indications: type 2 diabetes mellitus    evolocumab (REPATHA SURECLICK) 140 mg/mL Subcutaneous Pen Injector Inject 1 mL (140 mg total) under the skin Every 14 days    famotidine (PEPCID) 40 mg Oral Tablet Take 1 Tablet (40 mg total) by mouth Twice daily Indications: gastroesophageal reflux disease    fenofibrate nanocrystallized (TRICOR) 145 mg Oral Tablet Take 1 Tablet (145 mg total) by mouth Every morning with breakfast    ferrous sulfate (FERATAB) 324 mg (65 mg iron) Oral Tablet, Delayed Release (E.C.) Take 1 Tablet (324 mg total) by mouth    fluticasone propionate (FLONASE) 50 mcg/actuation Nasal Spray, Suspension USE 1 SPRAY IN EACH NOSTRIL ONCE A DAY    folic acid (FOLVITE) 1 mg Oral Tablet Take 1 Tablet (1 mg total) by mouth Once a day    KRILL OIL ORAL Take by mouth    losartan (COZAAR) 25 mg Oral Tablet Take 1 Tablet (25 mg total) by mouth Once a day Indications: high blood pressure    LUTEIN ORAL Take by mouth    melatonin 1 mg Oral Tablet As directed    metoprolol succinate (  TOPROL-XL) 25 mg Oral Tablet Sustained Release 24 hr Take 1 Tablet (25 mg total) by mouth Once a day Indications: high blood pressure    multivit-min/ferrous fumarate (MULTI VITAMIN ORAL) Take 1 Tablet by mouth Once a day    nitroGLYCERIN (NITROSTAT) 0.4 mg Sublingual Tablet, Sublingual Place 1 Tablet (0.4 mg total) under the tongue Every 5 minutes as needed for Chest pain for 3 doses over 15 minutes    nystatin (NYSTOP) 100,000 unit/gram Powder Apply topically Twice daily    silodosin (RAPAFLO) 8 mg Oral Capsule TAKE 1 CAPSULE (8 MG TOTAL) BY MOUTH ONCE A DAY     Allergies   Allergen Reactions    Gluten      Celiac disease     Statin [Atorvastatin] Myalgia     All statin medications     Past Medical History:   Diagnosis Date    Anemia     Atelectasis 12/31/2015    Formatting of this note might be different from the original.  History: post-op  Assessment: bibasilar atelectasis on CXR L>R.  Plan: Encourage ambulation, C&DB, PEP, pain control.      Atrial fibrillation (CMS HCC)     Blockage of coronary artery of heart (CMS HCC)     BPH associated with nocturia     CAD (coronary artery disease)     Celiac disease     Chest pain 05/07/2018    Chronic depression 05/23/2018    Chronic gout 01/02/2016    Was on allopurinol but stopped due to CKD   Never had a flare up       CKD (chronic kidney disease)     COVID-19 08/03/2020    Diet-controlled diabetes mellitus (CMS HCC) 05/23/2018    DVT (deep venous thrombosis) (CMS HCC)     Gastroesophageal reflux disease without esophagitis 05/23/2018    Insomnia 05/23/2018    Leg cramps, sleep related 05/23/2018    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  A/P  - Previously poorly controlled with fentanyl PCA, improved when switched to dilaudid; continue scheduled lidoderm patches and tyenol, prn oxycodone; goal pain score <4.      Pericardial effusion     Pulmonary emboli (CMS HCC)     Sleep apnea          Past Surgical History:   Procedure Laterality Date    HX CHOLECYSTECTOMY      HX CORONARY ARTERY BYPASS GRAFT      HX HEART SURGERY  12/2015    HX HERNIA REPAIR      Inguinal     HX LIPOMA RESECTION      Chest    ORIF HIP FRACTURE Left 2024    OTHER SURGICAL HISTORY Left 01/2016    Evacuation of hematoma -Left Atrium  at [facility]     SKIN CANCER EXCISION  06/21/2013    melanoma/ear/[facility]    TRANSURETHRAL MICROWAVE THERAPY  05/24/2018    Dr Evert Kohl         Family Medical History:       Problem Relation (Age of Onset)    Emphysema Father    Heart Attack General Family Hx    Heart Disease Mother,  Brother    Hypertension (High Blood Pressure) Mother    No Known Problems Other, Sister, Son, Son    Stroke Mother, Brother  Social History     Socioeconomic History    Marital status: Married     Spouse name: Marylene Land    Number of children: 1   Occupational History    Occupation: Other Comptroller     Comment: Retired   Tobacco Use    Smoking status: Never    Smokeless tobacco: Never   Vaping Use    Vaping status: Never Used   Substance and Sexual Activity    Alcohol use: Not Currently     Comment: rare    Drug use: Never    Sexual activity: Yes     Partners: Female       REVIEW OF SYSTEMS:  Constitutional: No fever.   Respiratory: See HPI.   Cardiovascular: See HPI.   All other systems reviewed and are negative except as noted in HOPI.    PHYSICAL EXAMINATION:     Vitals reviewed. BP 94/62   Pulse 75   Ht 1.905 m (6\' 3" )   Wt 102 kg (224 lb 9.6 oz)   SpO2 95%   BMI 28.07 kg/m       Vitals Filed         01/26/2024  1302             BP: 94/62    Pulse: 75            Constitutional: He is oriented to person, place, and time.   Cardiovascular: Normal rate and rhythm without murmurs.  No JVD.  No peripheral edema is noted.  Pulmonary/Chest: Lungs clear.    Abdominal: Soft. Nontender, non distended, no organomegaly.    ASSESSMENT:  ENCOUNTER DIAGNOSES     ICD-10-CM   1. Coronary artery disease involving native coronary artery of native heart without angina pectoris  I25.10   2. Essential hypertension  I10   3. Paroxysmal atrial fibrillation (CMS HCC)  I48.0   4. History of DVT of lower extremity  Z86.718   5. Mixed hyperlipidemia  E78.2       PLAN:    CAD post CABG: Patient states the palpitations are rare with the metoprolol. Patient denies use of nitroglycerin. He denies anginal symptoms. Patient encouraged to contact the office or go to the emergency department for chest pain, shortness of breath or palpitations.     2.    Essential hypertension:  Low today.  He has not monitor at home.   Encouraged fluid intake as well as monitoring blood pressures for the next 1-2 weeks and to call office with them as well if he is still having dizziness. If it is low, we can decrease Norvasc to 5 mg.     3.   Paroxsymal atrial fibrillation: Patient reports improved palpitations since starting metoprolol. He remains on Eliquis 5 mg b.i.d. for thromboembolic protection and denies bleeding complications. Recent Holter showed no evidence of atrial fibrillation.    4.    History of DVT: No recurrent episodes. Patient continues to be on Eliquis.     5.    Hyperlipidemia: continue Repatha for goal LDL less than 70. Most recent LDL 66.     Follow-up in 6 months or sooner if needed    Orders Placed This Encounter    metoprolol succinate (TOPROL-XL) 25 mg Oral Tablet Sustained Release 24 hr       Return in about 6 months (around 07/28/2024) for Brinton Brandel . for continued management of chronic health issues including CAD, paroxsymal atrial fibrillation, hypertension, and hyperlipidemia.  Electronically signed by     Morrie Sheldon, PA-C  01/26/2024, 11:30  Note reviewed and I agree with the above findings and assessment and plan by the nurse practitioner/PA.

## 2024-01-26 NOTE — Patient Instructions (Signed)
 Continue same medications    Goal BP 100/60 to 130/80    Goal heart rate 60 to 100    Call or go to the ED for chest pain, shortness of breath, or palpitations    INCREASE WATER INTAKE

## 2024-01-30 ENCOUNTER — Other Ambulatory Visit (INDEPENDENT_AMBULATORY_CARE_PROVIDER_SITE_OTHER): Payer: Self-pay | Admitting: FAMILY MEDICINE

## 2024-01-30 DIAGNOSIS — M810 Age-related osteoporosis without current pathological fracture: Secondary | ICD-10-CM

## 2024-01-30 NOTE — Telephone Encounter (Signed)
 [facility] is [facility] Refill  Last scheduled appointment with you was 11/10/2023, Currently scheduled future appointment is 05/10/2024,   Patient has been seen within the last year: Yes.    Pt had BMD done 11/03/23 that showed Osteoporosis  Pt was started on Fosamax 11/10/23     Confirmed preferred pharmacy for this refill encounter is   Preferred Pharmacy       [phone], [facility] - [address] [place] [address]    [address] [place] [address]    Phone: [phone] Fax: [phone]    Hours: Not open 24 hours     I updated med list and Rx or Rx's are ready to be sent to   [facility]   Dustin Brazee, MA  01/30/2024, 14:03

## 2024-02-07 IMAGING — CT CT CHEST-ABD-PELV W/ CM
2 of 5 series · 15 of 46 positions shown, 17 images · IV contrast (agent unspecified)
Comparison: 03/21/2022

CLINICAL DATA: Polytrauma, blunt [id number].  Fall.

EXAM:
CT CHEST, ABDOMEN, AND PELVIS WITH CONTRAST
TECHNIQUE: Multidetector CT imaging of the chest, abdomen and pelvis was
performed following the standard protocol during bolus
administration of intravenous contrast.

[Series 3: cap with · axial · 0.90mm/px · z∈[-1024,-419]mm · 12 of 145 slices shown, 14 images]
[im 12/145  soft-tissue]
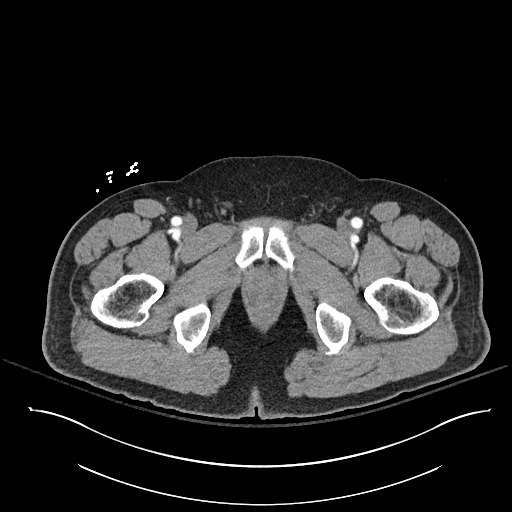
[im 12/145  bone]
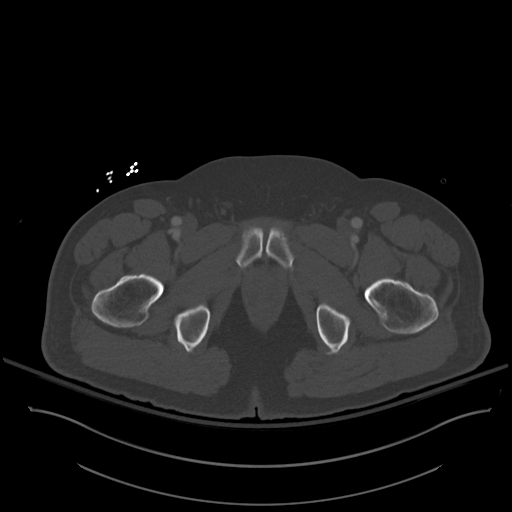
[im 23/145  soft-tissue]
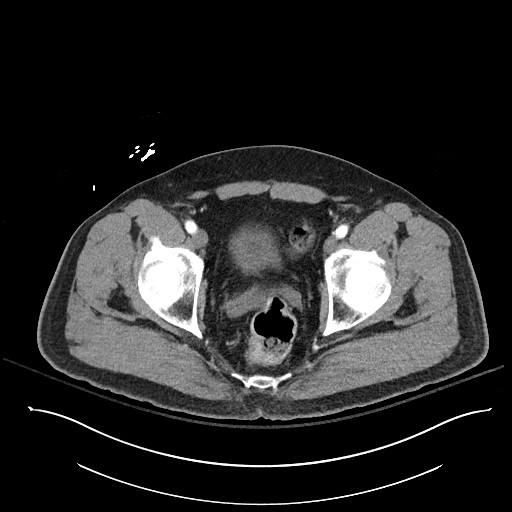
[im 34/145  soft-tissue]
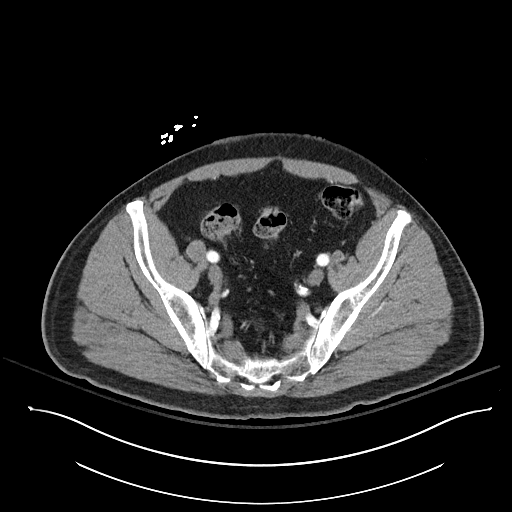
[im 45/145  soft-tissue]
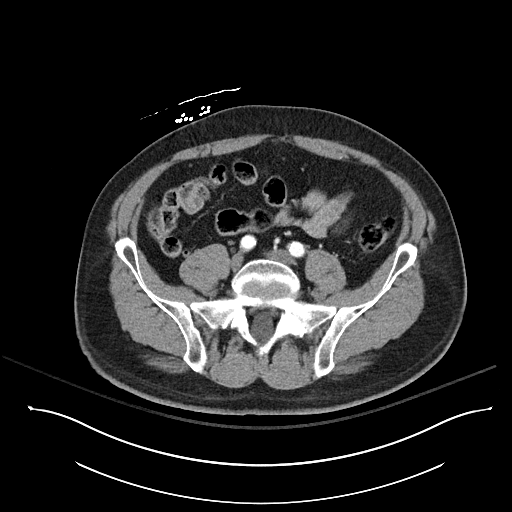
[im 56/145  soft-tissue]
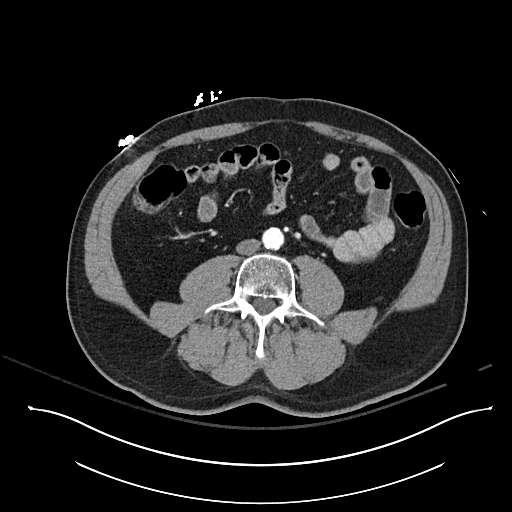
[im 67/145  soft-tissue]
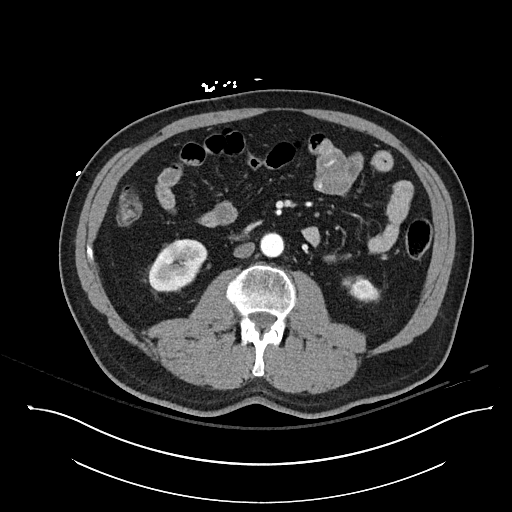
[im 78/145  soft-tissue]
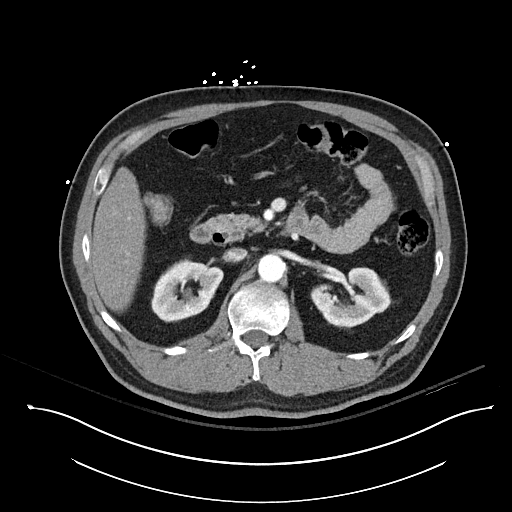
[im 89/145  soft-tissue]
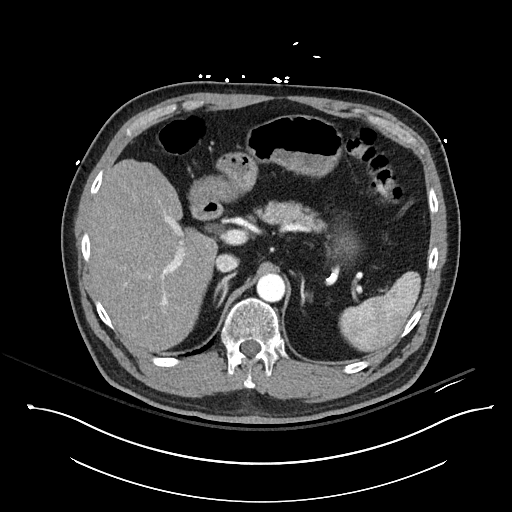
[im 100/145  soft-tissue]
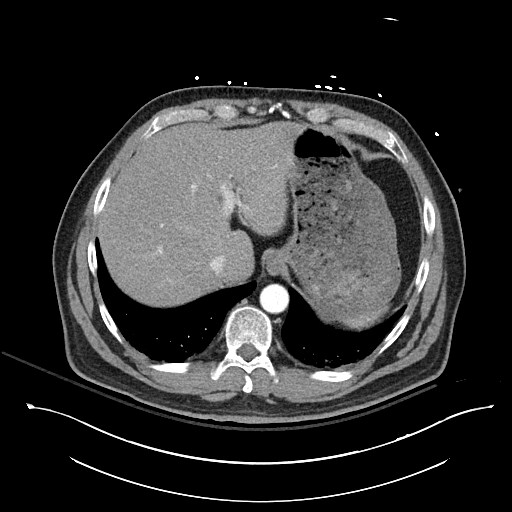
[im 100/145  bone]
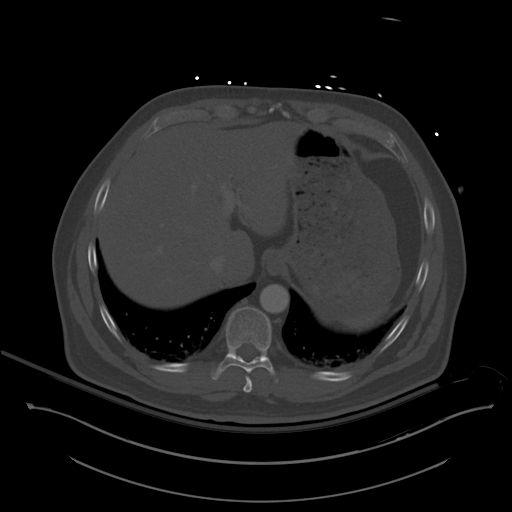
[im 111/145  soft-tissue]
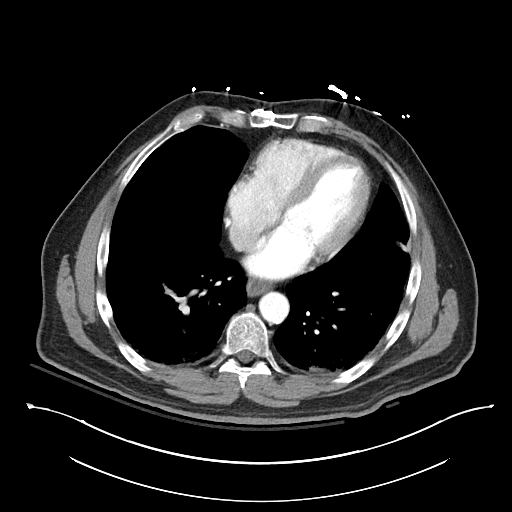
[im 122/145  soft-tissue]
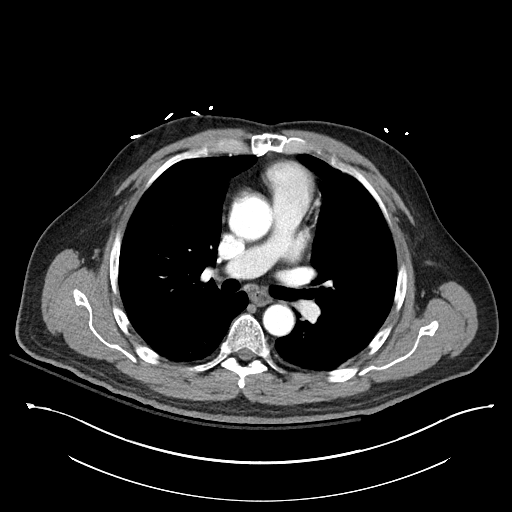
[im 133/145  soft-tissue]
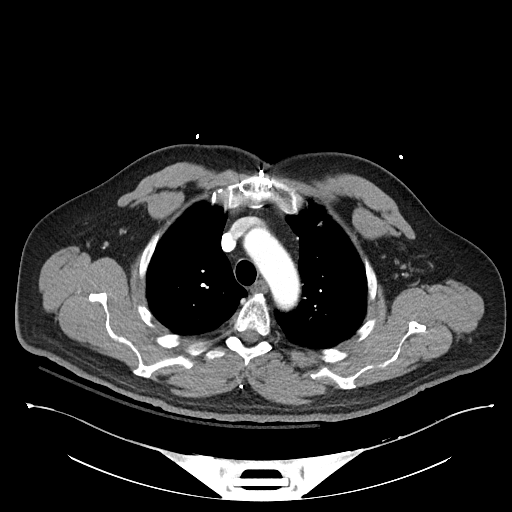

[Series 6: cor · coronal · 0.88mm/px · 3 of 109 slices shown]
[im 37/109  soft-tissue]
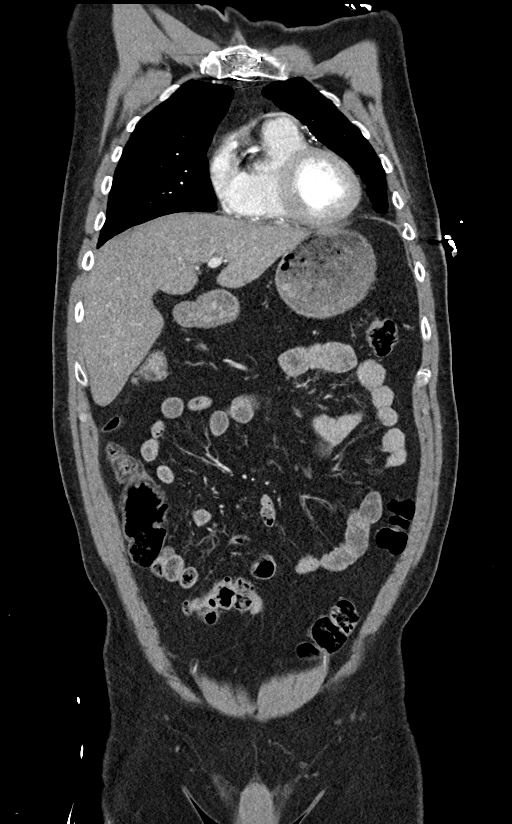
[im 49/109  soft-tissue]
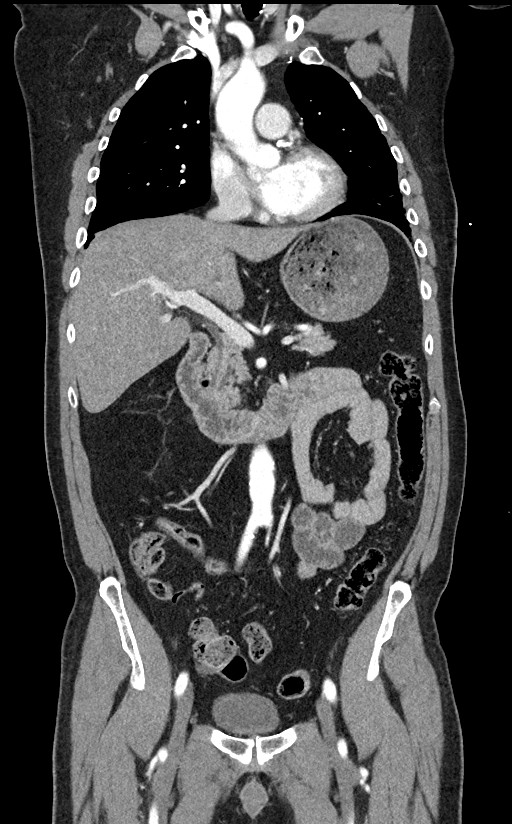
[im 61/109  soft-tissue]
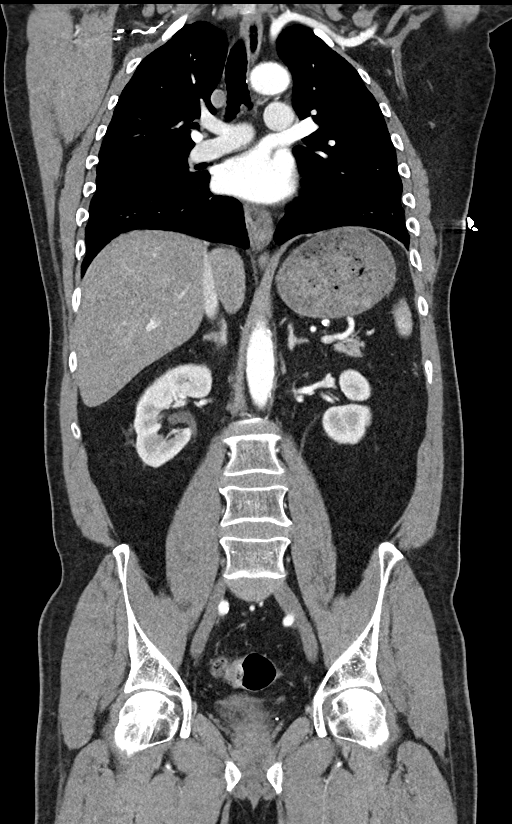

[15 of 46 positions shown; findings below may reference images not displayed]

RADIATION DOSE REDUCTION: This exam was performed according to the
departmental dose-optimization program which includes automated
exposure control, adjustment of the mA and/or kV according to
patient size and/or use of iterative reconstruction technique.

CONTRAST:  80mL OMNIPAQUE IOHEXOL 350 MG/ML SOLN
FINDINGS: CT CHEST FINDINGS

Cardiovascular: Prior CABG. Heart is normal size. Aorta is normal
caliber.

Mediastinum/Nodes: No mediastinal, hilar, or axillary adenopathy.
Trachea and esophagus are unremarkable. Thyroid unremarkable.

Lungs/Pleura: Dependent atelectasis or scarring posteriorly in the
lower lobes. No effusions or pneumothorax. No confluent airspace
opacities.

Musculoskeletal: Chest wall soft tissues are unremarkable. No acute
bony abnormality.

CT ABDOMEN PELVIS FINDINGS

Hepatobiliary: No hepatic injury or perihepatic hematoma. Diffuse
low-density throughout the liver compatible with fatty infiltration.
Prior cholecystectomy.

Pancreas: No focal abnormality or ductal dilatation.

Spleen: No splenic injury or perisplenic hematoma.

Adrenals/Urinary Tract: No adrenal hemorrhage or renal injury
identified. Bladder is unremarkable.

Stomach/Bowel: Normal appendix. Stomach, large and small bowel
grossly unremarkable.

Vascular/Lymphatic: Scattered aortic atherosclerosis. No evidence of
aneurysm or adenopathy.

Reproductive: No visible focal abnormality.

Other: No free fluid or free air.

Musculoskeletal: No acute bony abnormality.
IMPRESSION: No acute findings or evidence of significant traumatic injury in the
chest, abdomen or pelvis.

Prior CABG.  Aortic atherosclerosis.

Hepatic steatosis.

These results were called by telephone at the time of interpretation
on 03/27/2022 at [DATE] to provider Dr. Jitula Bukovjan, who
verbally acknowledged these results.

## 2024-02-09 ENCOUNTER — Other Ambulatory Visit (INDEPENDENT_AMBULATORY_CARE_PROVIDER_SITE_OTHER): Payer: Self-pay | Admitting: FAMILY MEDICINE

## 2024-02-09 ENCOUNTER — Encounter (INDEPENDENT_AMBULATORY_CARE_PROVIDER_SITE_OTHER): Payer: Self-pay | Admitting: FAMILY MEDICINE

## 2024-02-09 DIAGNOSIS — F32A Depression, unspecified: Secondary | ICD-10-CM

## 2024-02-09 MED ORDER — CITALOPRAM 10 MG TABLET
10.0000 mg | ORAL_TABLET | Freq: Every day | ORAL | 1 refills | Status: DC
Start: 2024-02-09 — End: 2024-08-02

## 2024-02-09 NOTE — Telephone Encounter (Signed)
 Last scheduled appointment with you was 11/10/2023.    Currently scheduled future appointment is 05/10/2024    Alwyn Juba, MA  02/09/2024, 13:29

## 2024-02-26 ENCOUNTER — Encounter (INDEPENDENT_AMBULATORY_CARE_PROVIDER_SITE_OTHER): Payer: Self-pay | Admitting: FAMILY MEDICINE

## 2024-02-26 ENCOUNTER — Other Ambulatory Visit (INDEPENDENT_AMBULATORY_CARE_PROVIDER_SITE_OTHER): Payer: Self-pay | Admitting: FAMILY MEDICINE

## 2024-02-26 DIAGNOSIS — I480 Paroxysmal atrial fibrillation: Secondary | ICD-10-CM

## 2024-02-26 NOTE — Telephone Encounter (Signed)
 Last scheduled appointment with you was 11/10/2023.    Currently scheduled future appointment is 05/10/2024.    [address] [place], [place]  02/26/2024, 09:34            Dustin Webster to [name] (supporting [name], MD)      02/26/24  9:23 AM  I am in [place]  for about a month. I have ran out of Eliquis and there are no refills left. This is my most important medication since I have had blood clots in my lungs twice in the past. Please [facility] [place] Phone number [phone]. Thank You

## 2024-02-27 MED ORDER — APIXABAN 5 MG TABLET
5.0000 mg | ORAL_TABLET | Freq: Two times a day (BID) | ORAL | 0 refills | Status: DC
Start: 2024-02-27 — End: 2024-05-24

## 2024-03-18 ENCOUNTER — Telehealth (INDEPENDENT_AMBULATORY_CARE_PROVIDER_SITE_OTHER): Payer: Self-pay | Admitting: FAMILY MEDICINE

## 2024-03-18 ENCOUNTER — Encounter (INDEPENDENT_AMBULATORY_CARE_PROVIDER_SITE_OTHER): Payer: Self-pay | Admitting: FAMILY MEDICINE

## 2024-03-18 NOTE — Telephone Encounter (Signed)
 Referral  (Newest Message First)Dustin Webster to [name] (supporting [name], MD)         03/18/24  9:36 AM  I have extremely flat feet. I have had custom orthopedics for years. Mine are worn out and I need new ones. Please make a referral to [facility] so I can make an appointment with them.  They will not see me without one.    THANKS

## 2024-04-09 ENCOUNTER — Ambulatory Visit: Payer: Self-pay

## 2024-04-09 ENCOUNTER — Other Ambulatory Visit: Payer: Self-pay

## 2024-04-09 ENCOUNTER — Encounter (INDEPENDENT_AMBULATORY_CARE_PROVIDER_SITE_OTHER): Payer: Self-pay

## 2024-04-09 ENCOUNTER — Ambulatory Visit (INDEPENDENT_AMBULATORY_CARE_PROVIDER_SITE_OTHER)
Admission: RE | Admit: 2024-04-09 | Discharge: 2024-04-09 | Disposition: A | Discharge status: Home / Self Care | Source: Ambulatory Visit

## 2024-04-09 VITALS — BP 120/62 | Ht 75.0 in | Wt 229.0 lb

## 2024-04-09 DIAGNOSIS — B353 Tinea pedis: Secondary | ICD-10-CM | POA: Insufficient documentation

## 2024-04-09 DIAGNOSIS — M2141 Flat foot [pes planus] (acquired), right foot: Secondary | ICD-10-CM | POA: Insufficient documentation

## 2024-04-09 DIAGNOSIS — M2142 Flat foot [pes planus] (acquired), left foot: Secondary | ICD-10-CM | POA: Insufficient documentation

## 2024-04-09 DIAGNOSIS — M79671 Pain in right foot: Secondary | ICD-10-CM | POA: Insufficient documentation

## 2024-04-09 DIAGNOSIS — M79672 Pain in left foot: Secondary | ICD-10-CM

## 2024-04-09 NOTE — Progress Notes (Deleted)
 Dustin Webster [facility]  [address]  [place] [address]  Operated by [facility]    Follow Up       Name: Dustin Webster MRN:  [id number]   Date: 04/09/2024 Age: 68 y.o.     Chief Complaint:    Foot Pain (Pes Planus (wants new orthotics)/Pain:0/10 FWB in tennis shoes. A1C:6.2% on 11/06/23 Pt states he needs referral for orthotics since it's been too long. He states he hasn't had anyone examine feet for a long time. Pt states his right hallux has a spot on it and is a little bit more swollen and dog keeps sniffing right foot.)      HPI: Dustin Webster is a 68 y.o. male who presents for bilateral foot pain. Patient denies experiencing any pain today. Patient complains of mild bilateral foot pain. Patient reports that his pain is exacerbated with long periods of weightbearing and ambulation. Patient reports that due to long period of time since last orthotics, he needs new referral for orthotics. Patient presents FWB in tennis shoes with no other pedal complaints.    History:  Past Medical History:   Diagnosis Date    Anemia     Atelectasis 12/31/2015    Formatting of this note might be different from the original.  History: post-op  Assessment: bibasilar atelectasis on CXR L>R.  Plan: Encourage ambulation, C&DB, PEP, pain control.      Atrial fibrillation     Blockage of coronary artery of heart (CMS HCC)     BPH associated with nocturia     CAD (coronary artery disease)     Celiac disease     Chest pain 05/07/2018    Chronic depression 05/23/2018    Chronic gout 01/02/2016    Was on allopurinol  but stopped due to CKD   Never had a flare up       CKD (chronic kidney disease)     COVID-19 08/03/2020    Diet-controlled diabetes mellitus (CMS HCC) 05/23/2018    DVT (deep venous thrombosis)     Gastroesophageal reflux disease without esophagitis 05/23/2018    Insomnia 05/23/2018    Leg cramps, sleep related 05/23/2018    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  A/P  - Previously poorly controlled with fentanyl PCA, improved when switched to dilaudid; continue scheduled lidoderm  patches and tyenol, prn oxycodone; goal pain score <4.      Pericardial effusion     Pulmonary emboli     Sleep apnea          Family Medical History:       Problem Relation (Age of Onset)    Emphysema Father    Heart Attack General Family Hx    Heart Disease Mother, Brother    Hypertension (High Blood Pressure) Mother    No Known Problems Other, Sister, Son, Son    Stroke Mother, Brother            Social History     Socioeconomic History    Marital status: Married     Spouse name: Shelvy Dickens    Number of children: 1   Occupational History    Occupation: Other Comptroller     Comment: Retired   Tobacco Use    Smoking status: Never    Smokeless tobacco: Never   Advertising account planner  Vaping status: Never Used   Substance and Sexual Activity    Alcohol use: Not Currently     Comment: rare    Drug use: Never    Sexual activity: Yes     Partners: Female      Past Surgical History:   Procedure Laterality Date    HX CHOLECYSTECTOMY      HX CORONARY ARTERY BYPASS GRAFT      HX HEART SURGERY  12/2015    HX HERNIA REPAIR      Inguinal     HX LIPOMA RESECTION      Chest    ORIF HIP FRACTURE Left 2024    OTHER SURGICAL HISTORY Left 01/2016    Evacuation of hematoma -[facility] Atrium  at [facility]     SKIN CANCER EXCISION  06/21/2013    melanoma/ear/[facility]    TRANSURETHRAL MICROWAVE THERAPY  05/24/2018    Dr Antony Baumgartner         Medications:  Current Outpatient Medications   Medication Sig    alendronate  (FOSAMAX ) 70 mg Oral Tablet Take 1 Tablet (70 mg total) by mouth Every 7 days Indications: osteoporosis in male patient    amLODIPine  (NORVASC ) 10 mg Oral Tablet Take 1 Tablet (10 mg total) by mouth Daily    apixaban  (ELIQUIS ) 5 mg Oral Tablet Take 1 Tablet (5 mg total) by mouth Twice daily Indications: blood clot in a deep vein of the  extremities    calamine Lotion Apply topically Three times a day as needed    Calcium  Carbonate (OS-CAL) 650 mg calcium  (1,625 mg) Oral Tablet Every evening     Cholecalciferol, Vitamin D3, 50 mcg (2,000 unit) Oral Capsule Take by mouth    citalopram  (CELEXA ) 10 mg Oral Tablet Take 1 Tablet (10 mg total) by mouth Daily    dapagliflozin  propanediol (FARXIGA ) 10 mg Oral Tablet Take 1 Tablet (10 mg total) by mouth Once a day Indications: type 2 diabetes mellitus    evolocumab  (REPATHA  SURECLICK) 140 mg/mL Subcutaneous Pen Injector Inject 1 mL (140 mg total) under the skin Every 14 days    famotidine  (PEPCID ) 40 mg Oral Tablet Take 1 Tablet (40 mg total) by mouth Twice daily Indications: gastroesophageal reflux disease    fenofibrate  nanocrystallized (TRICOR ) 145 mg Oral Tablet Take 1 Tablet (145 mg total) by mouth Every morning with breakfast    ferrous sulfate  (FERATAB) 324 mg (65 mg iron ) Oral Tablet, Delayed Release (E.C.) Take 1 Tablet (324 mg total) by mouth    fluticasone  propionate (FLONASE ) 50 mcg/actuation Nasal Spray, Suspension USE 1 SPRAY IN EACH NOSTRIL ONCE A DAY    folic acid  (FOLVITE ) 1 mg Oral Tablet Take 1 Tablet (1 mg total) by mouth Daily    KRILL OIL ORAL Take by mouth    losartan  (COZAAR ) 25 mg Oral Tablet Take 1 Tablet (25 mg total) by mouth Once a day Indications: high blood pressure    LUTEIN ORAL Take by mouth    melatonin 1 mg Oral Tablet As directed    metoprolol  succinate (TOPROL -XL) 25 mg Oral Tablet Sustained Release 24 hr Take 1 Tablet (25 mg total) by mouth Once a day Indications: high blood pressure    multivit-min/ferrous fumarate (MULTI VITAMIN ORAL) Take 1 Tablet by mouth Once a day    nitroGLYCERIN  (NITROSTAT ) 0.4 mg Sublingual Tablet, Sublingual Place 1 Tablet (0.4 mg total) under the tongue Every 5 minutes as needed for Chest pain for 3 doses over 15 minutes    nystatin  (NYSTOP )  100,000 unit/gram Powder Apply topically Twice daily    silodosin  (RAPAFLO ) 8 mg Oral Capsule TAKE 1  CAPSULE (8 MG TOTAL) BY MOUTH ONCE A DAY     Allergies:  Allergies   Allergen Reactions    Gluten      Celiac disease    Statin [Atorvastatin] Myalgia     All statin medications       ROS:  Review of systems negative except noted in HPI     PE:  Vitals:    04/09/24 1444   BP: 120/62   Weight: 104 kg (229 lb)   Height: 1.905 m (6\' 3" )   BMI: 28.62       Focused Lower Extremity Exam:  Vascular:   Dorsalis pedis and posterior tibial pulses bilateral.  Capillary Fill time < 3 seconds to digits 1-5 bilateral.  Skin temperature warm to warm tibial tuberosity to the digits bilateral.  Hair growth present.  No edema.   No varicosities.     Neurological:   Intact vibratory sensation at the hallux IPJ bilateral.  Intact protective sensation bilateral via SWMF.     Dermatological:   Skin appears well hydrated and supple.   Good color, texture, turgor.   No open lesions present.   No callosities present.   Webspaces clean and dry 1-4 bilateral.   Nails 1-5 bilaterally appear normal.  Skin:   No skin rash, subcutaneous nodules, lesions or ulcers observed bilateral LE  Skin is warm and dry with normal turgor   interdigital maceration bilateral     Musculoskeletal/Orthopaedic:   Structurally within normal limits.  +5/5 muscle strength Dorsiflexion, Plantarflexion, Inversion, Eversion bilateral.  ROM of the 1st MTP joint is full bilateral.    ROM of the MTJ/STJ is full without pain or crepitus bilateral.    Ankle joint ROM is normal bilateral.      Flexible flat foot bilateral    Minimal pain to palpation to posterior tibial tendon at the insertion bilateral  Minimal pain to palpation to sinus tarsi bilateral   No pain to palpation to deltoids bilateral    Imaging Studies and Procedures:    XR FEET WT BEARING BILATERAL (AMB ONLY)     Performed by: Woodroe Hazel, DPM  Authorized by: Woodroe Hazel, DPM    Time Out:     Immediately before the procedure, a time out was called:  Yes    Patient verified:  Yes    Procedure Verified:   Yes    Site Verified:  Yes  Documentation:      X-ray:  3 views of the bilateral foot weight-bearing were personally reviewed and interpreted by myself today.      Right foot:  Decreased calcaneal inclination,  mild 1st ray elevatus, bone density mildly decreased, all the joint lines normal.  A proximally 25% talar head uncovering.  Rectus forefoot.  Inconclusive for other acute pathology.     Left foot:  Decreased calcaneal inclination, bone density normal, mild 1st ray elevatus, all the joint lines normal purely a proximally 25% talar head uncovering rectus forefoot inconclusive for other acute pathology.     Radiographs reviewed with the patient to patient's full understanding.  All questions were answered to patient's apparent satisfaction.        Assessment and Plan:    ICD-10-CM    1. Pain in both feet  M79.671 XR FEET WT BEARING BILATERAL    M79.672 XR FEET WT BEARING BILATERAL (AMB ONLY)  A comprehensive history and physical examination were preformed. The patient was educated on clinical and radiographic findings, diagnosis and treatment plans. Patient state that he understands all that has been explained and all questions were answered to his apparent satisfaction.      Discussed with the patient etiology of athlete's foot, tinea pedis.  Discussed with the patient treatment plan which includes pharmaceutical application clotrimazole betamethasone cream.  Discussed with the patient to apply twice a day and immediately put a sock on the area.  Advised patient to keep foot clean and dry with the use of a hair drier on low temperature.  Advised patient to use Lysol or a disinfectant spray on all shoes in order to eradicate fungal infection inside shoes as well.    Discussed with the patient etiology flat feet also known as pes planus.  Discussed with the patient that there is congenital and adult acquired pes planus.  Discussed with the patient that this involves the collapse of the medial arch  secondary to foot structure as well as posterior tibial tendon dysfunction.  Discussed with the patient that this flatfoot condition causes patient to ambulate in an over pronated position causing tension on the posterior tibial tendon.  Discussed with the patient that pes planus condition is a common condition and is usually treated conservatively depending on severity of pain and arthritis to the associated joints.  Discussed with patient conservative treatment involves activity modifications involving less prolonged walking or standing, weight loss in order to create less aggravation to the medial arch, orthotic devices in order to support the arch, NSAIDs in order to decrease pain and inflammation, physical therapy in order to improve strength of posterior tibial tendon, shoe modification in order to support the arch and prevent overpronation, or AFO devices such as Ritchie braces depending on condition.     Discussed with the patient etiology of sinus tarsi syndrome.  Discussed with the patient that sinus tarsi syndrome is due to inflammation of the sinus tarsi space secondary to persistent trauma i.e. ankle sprains or flat or over pronated foot.  Discussed with the patient that this could cause swelling and persistent pain to the area due to compression between the talus and calcaneus.  Discussed with the patient conservative measures for this condition includes use of NSAIDs to decrease pain and inflammation, use of orthotic devices in order to stabilize the subtalar joint (dispensed Powerstep inserts and encouraged daily use), possible immobilization if pain is severe, and steroid injection into the area to decrease pain and inflammation. We discussed undergoing a steroid injection today. Risks were reviewed including infection, continued pain, allergic reaction, bleeding, possible tendon rupture, fat pad atrophy, hypopigmentation and elevated blood sugar in diabetics.   Time out performed     Discussed with  patient etiology of callus formation i.e. the body producing thicker tissue in order to protect boney prominences.    Discussed with patient that callus formation is secondary to pressure from boney prominences and contractures of the digits.    Discussed with patient that treatment will involve reduction of callus formation with debridement, urea cream, and off-loading of the area with supportive shoes and padding.    Discussed with the patient that the use of Gold bond diabetic cream would promote overall skin health.     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     Rx: custom orthotics to [facility]  Rx ketaonazol     Ketoconazole     Follow Up:  No follow-ups on file.    I am scribing for, and in the presence of, Dr. Kirby Peoples for services provided on 04/09/2024.  Lyrick Worland Carolynne Citron, SCRIBE     Rhys Lichty Carolynne Citron, SCRIBE

## 2024-04-09 NOTE — Procedures (Unsigned)
 [facility]  [address]  [place] [address]  Operated by [facility]  Procedure Note    Name: Dustin Webster MRN:  [id number]   Date: 04/09/2024 DOB:  1956-02-08 (68 y.o.)         XR FEET WT BEARING BILATERAL (AMB ONLY)    Performed by: Woodroe Hazel, DPM  Authorized by: Woodroe Hazel, DPM    Time Out:     Immediately before the procedure, a time out was called:  Yes    Patient verified:  Yes    Procedure Verified:  Yes    Site Verified:  Yes  Documentation:      X-ray:  3 views of the bilateral foot weight-bearing were personally reviewed and interpreted by myself today.     Right foot:  Decreased calcaneal inclination,  mild 1st ray elevatus, bone density mildly decreased, all the joint lines normal.  A proximally 25% talar head uncovering.  Rectus forefoot.  Inconclusive for other acute pathology.    Left foot:  Decreased calcaneal inclination, bone density normal, mild 1st ray elevatus, all the joint lines normal purely a proximally 25% talar head uncovering rectus forefoot inconclusive for other acute pathology.    Radiographs reviewed with the patient to patient's full understanding.  All questions were answered to patient's apparent satisfaction.             Woodroe Hazel, [place]

## 2024-04-10 ENCOUNTER — Other Ambulatory Visit (INDEPENDENT_AMBULATORY_CARE_PROVIDER_SITE_OTHER): Payer: Self-pay | Admitting: FAMILY MEDICINE

## 2024-04-10 ENCOUNTER — Encounter (INDEPENDENT_AMBULATORY_CARE_PROVIDER_SITE_OTHER): Payer: Self-pay | Admitting: FAMILY MEDICINE

## 2024-04-10 DIAGNOSIS — N401 Enlarged prostate with lower urinary tract symptoms: Secondary | ICD-10-CM

## 2024-04-10 MED ORDER — SILODOSIN 8 MG CAPSULE: 8.0000 mg | ORAL_CAPSULE | Freq: Every day | ORAL | 1 refills | Status: DC

## 2024-04-10 MED ORDER — KETOCONAZOLE 2 % TOPICAL CREAM
TOPICAL_CREAM | Freq: Every day | CUTANEOUS | 0 refills | Status: AC
Start: 2024-04-10 — End: 2024-05-10

## 2024-04-10 NOTE — Telephone Encounter (Signed)
 Last visit with PCP was 11/10/2023.    Last office visit in the department was 11/10/2023 .  Currently scheduled future appointment in the department is 05/10/2024.    Faythe Hopes, LPN, 3/32/9518 , 16:14

## 2024-04-10 NOTE — Progress Notes (Signed)
 Drexel Gentles Sofia to [name] Clinical Support (supporting [name], MD) (Selected Message)        04/10/24  4:00 PM  Hi, I am almost out of Amlodpine 10 mg.  The system will not allow me to request a refill.  Please call in a 90 day prescription to [facility] [address]. THANKS

## 2024-04-10 NOTE — Telephone Encounter (Signed)
 We have received [facility] from [facility]   [address]  asking for a new  90 day prescription for Silodosin  8mg  .    Last scheduled appointment with you was 11/10/2023, Currently scheduled future appointment is 05/10/2024,     Patient has been seen within the last year: Yes.    Confirmed preferred pharmacy for this refill encounter is   Preferred Pharmacy       [facility] [phone] [place], [place] - [address] [place] [address]    [address] [place] [address]    Phone: [phone] Fax: [phone]    Hours: Not open 24 hours   I updated med list and Rx or Rx's are ready to be sent to   [facility] [address]     Estiven Kohan, MA  04/10/2024, 09:44

## 2024-04-10 NOTE — Progress Notes (Signed)
 [facility]  [address]  [place] [address]  Operated by [facility]    Follow Up       Name: Dustin Webster MRN:  [id number]   Date: 04/09/2024 Age: 68 y.o.     Chief Complaint:    Foot Pain (Pes Planus (wants new orthotics)/Pain:0/10 FWB in tennis shoes. A1C:6.2% on 11/06/23 Pt states he needs referral for orthotics since it's been too long. He states he hasn't had anyone examine feet for a long time. Pt states his right hallux has a spot on it and is a little bit more swollen and dog keeps sniffing right foot.)      HPI: Dustin Webster is a 68 y.o. male who presents for bilateral foot pain. Patient denies experiencing any pain today. Patient complains of mild bilateral foot pain. Patient reports that his pain is exacerbated with long periods of weightbearing and ambulation. Patient reports that due to long period of time since last orthotics, he needs new referral for orthotics. Patient presents FWB in tennis shoes with no other pedal complaints.    History:  Past Medical History:   Diagnosis Date    Anemia     Atelectasis 12/31/2015    Formatting of this note might be different from the original.  History: post-op  Assessment: bibasilar atelectasis on CXR L>R.  Plan: Encourage ambulation, C&DB, PEP, pain control.      Atrial fibrillation     Blockage of coronary artery of heart (CMS HCC)     BPH associated with nocturia     CAD (coronary artery disease)     Celiac disease     Chest pain 05/07/2018    Chronic depression 05/23/2018    Chronic gout 01/02/2016    Was on allopurinol  but stopped due to CKD   Never had a flare up       CKD (chronic kidney disease)     COVID-19 08/03/2020    Diet-controlled diabetes mellitus (CMS HCC) 05/23/2018    DVT (deep venous thrombosis)     Gastroesophageal reflux disease without esophagitis 05/23/2018    Insomnia 05/23/2018    Leg cramps, sleep related 05/23/2018    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  A/P  - Previously poorly controlled with fentanyl PCA, improved when switched to dilaudid; continue scheduled lidoderm  patches and tyenol, prn oxycodone; goal pain score <4.      Pericardial effusion     Pulmonary emboli     Sleep apnea          Family Medical History:       Problem Relation (Age of Onset)    Emphysema Father    Heart Attack General Family Hx    Heart Disease Mother, Brother    Hypertension (High Blood Pressure) Mother    No Known Problems Other, Sister, Son, Son    Stroke Mother, Brother            Social History     Socioeconomic History    Marital status: Married     Spouse name: Shelvy Dickens    Number of children: 1   Occupational History    Occupation: Other Comptroller     Comment: Retired   Tobacco Use    Smoking status: Never    Smokeless tobacco: Never   Advertising account planner  Vaping status: Never Used   Substance and Sexual Activity    Alcohol use: Not Currently     Comment: rare    Drug use: Never    Sexual activity: Yes     Partners: Female      Past Surgical History:   Procedure Laterality Date    HX CHOLECYSTECTOMY      HX CORONARY ARTERY BYPASS GRAFT      HX HEART SURGERY  12/2015    HX HERNIA REPAIR      Inguinal     HX LIPOMA RESECTION      Chest    ORIF HIP FRACTURE Left 2024    OTHER SURGICAL HISTORY Left 01/2016    Evacuation of hematoma -Left Atrium  at [facility]     SKIN CANCER EXCISION  06/21/2013    melanoma/ear/[facility]    TRANSURETHRAL MICROWAVE THERAPY  05/24/2018    Dr Antony Baumgartner         Medications:  Current Outpatient Medications   Medication Sig    alendronate  (FOSAMAX ) 70 mg Oral Tablet Take 1 Tablet (70 mg total) by mouth Every 7 days Indications: osteoporosis in male patient    amLODIPine  (NORVASC ) 10 mg Oral Tablet Take 1 Tablet (10 mg total) by mouth Daily    apixaban  (ELIQUIS ) 5 mg Oral Tablet Take 1 Tablet (5 mg total) by mouth Twice daily Indications: blood clot in a deep vein of the  extremities    calamine Lotion Apply topically Three times a day as needed    Calcium  Carbonate (OS-CAL) 650 mg calcium  (1,625 mg) Oral Tablet Every evening     Cholecalciferol, Vitamin D3, 50 mcg (2,000 unit) Oral Capsule Take by mouth    citalopram  (CELEXA ) 10 mg Oral Tablet Take 1 Tablet (10 mg total) by mouth Daily    dapagliflozin  propanediol (FARXIGA ) 10 mg Oral Tablet Take 1 Tablet (10 mg total) by mouth Once a day Indications: type 2 diabetes mellitus    evolocumab  (REPATHA  SURECLICK) 140 mg/mL Subcutaneous Pen Injector Inject 1 mL (140 mg total) under the skin Every 14 days    famotidine  (PEPCID ) 40 mg Oral Tablet Take 1 Tablet (40 mg total) by mouth Twice daily Indications: gastroesophageal reflux disease    fenofibrate  nanocrystallized (TRICOR ) 145 mg Oral Tablet Take 1 Tablet (145 mg total) by mouth Every morning with breakfast    ferrous sulfate  (FERATAB) 324 mg (65 mg iron ) Oral Tablet, Delayed Release (E.C.) Take 1 Tablet (324 mg total) by mouth    fluticasone  propionate (FLONASE ) 50 mcg/actuation Nasal Spray, Suspension USE 1 SPRAY IN EACH NOSTRIL ONCE A DAY    folic acid  (FOLVITE ) 1 mg Oral Tablet Take 1 Tablet (1 mg total) by mouth Daily    ketoconazole (NIZORAL) 2 % Cream Apply topically Daily for 30 days    KRILL OIL ORAL Take by mouth    losartan  (COZAAR ) 25 mg Oral Tablet Take 1 Tablet (25 mg total) by mouth Once a day Indications: high blood pressure    LUTEIN ORAL Take by mouth    melatonin 1 mg Oral Tablet As directed    metoprolol  succinate (TOPROL -XL) 25 mg Oral Tablet Sustained Release 24 hr Take 1 Tablet (25 mg total) by mouth Once a day Indications: high blood pressure    multivit-min/ferrous fumarate (MULTI VITAMIN ORAL) Take 1 Tablet by mouth Once a day    nitroGLYCERIN  (NITROSTAT ) 0.4 mg Sublingual Tablet, Sublingual Place 1 Tablet (0.4 mg total) under the tongue Every 5 minutes as needed  for Chest pain for 3 doses over 15 minutes    nystatin  (NYSTOP ) 100,000 unit/gram Powder Apply  topically Twice daily    silodosin  (RAPAFLO ) 8 mg Oral Capsule Take 1 Capsule (8 mg total) by mouth Daily Indications: enlarged prostate with urination problem     Allergies:  Allergies   Allergen Reactions    Gluten      Celiac disease    Statin [Atorvastatin] Myalgia     All statin medications       ROS:  Review of systems negative except noted in HPI     PE:  Vitals:    04/09/24 1444   BP: 120/62   Weight: 104 kg (229 lb)   Height: 1.905 m (6\' 3" )   BMI: 28.62        Vascular:   Dorsalis pedis and posterior tibial pulses bilateral.  Capillary Fill time < 3 seconds to digits 1-5 bilateral.  Skin temperature warm to warm tibial tuberosity to the digits bilateral.  Hair growth present.  No edema.   No varicosities.     Neurological:   Intact vibratory sensation at the hallux IPJ bilateral.  Intact protective sensation bilateral via SWMF.     Dermatological:   Skin appears well hydrated and supple.   Good color, texture, turgor.   No open lesions present.   No callosities present.   Webspaces clean and dry 1-4 bilateral.   Nails 1-5 bilaterally appear normal.  Skin:   No skin rash, subcutaneous nodules, lesions or ulcers observed bilateral LE  Skin is warm and dry with normal turgor   interdigital maceration bilateral     Musculoskeletal/Orthopaedic:   Structurally within normal limits.  +5/5 muscle strength Dorsiflexion, Plantarflexion, Inversion, Eversion bilateral.  ROM of the 1st MTP joint is full bilateral.    ROM of the MTJ/STJ is full without pain or crepitus bilateral.    Ankle joint ROM is normal bilateral.      Flexible flat foot bilateral    Minimal pain to palpation to posterior tibial tendon at the insertion bilateral  Minimal pain to palpation to sinus tarsi bilateral   No pain to palpation to deltoids bilateral    Imaging Studies and Procedures:    XR FEET WT BEARING BILATERAL (AMB ONLY)     Performed by: Woodroe Hazel, DPM  Authorized by: Woodroe Hazel, DPM    Time Out:     Immediately before  the procedure, a time out was called:  Yes    Patient verified:  Yes    Procedure Verified:  Yes    Site Verified:  Yes  Documentation:      X-ray:  3 views of the bilateral foot weight-bearing were personally reviewed and interpreted by myself today.      Right foot:  Decreased calcaneal inclination,  mild 1st ray elevatus, bone density mildly decreased, all the joint lines normal.  A proximally 25% talar head uncovering.  Rectus forefoot.  Inconclusive for other acute pathology.     Left foot:  Decreased calcaneal inclination, bone density normal, mild 1st ray elevatus, all the joint lines normal purely a proximally 25% talar head uncovering rectus forefoot inconclusive for other acute pathology.     Radiographs reviewed with the patient to patient's full understanding.  All questions were answered to patient's apparent satisfaction.        Assessment and Plan:    ICD-10-CM    1. Pain in both feet  M79.671 XR FEET WT BEARING BILATERAL  M79.672 XR FEET WT BEARING BILATERAL (AMB ONLY)      2. Tinea pedis, unspecified laterality  B35.3 ketoconazole (NIZORAL) 2 % Cream      3. Pes planus of both feet  M21.41 DME - OTHER    M21.42            A comprehensive history and physical examination were preformed. The patient was educated on clinical and radiographic findings, diagnosis and treatment plans. Patient state that he understands all that has been explained and all questions were answered to his apparent satisfaction.      Discussed with the patient etiology of athlete's foot, tinea pedis.  Discussed with the patient treatment plan which includes pharmaceutical application ketoconazole cream.  Discussed with the patient to apply twice a day and immediately put a sock on the area.  Advised patient to keep foot clean and dry with the use of a hair drier on low temperature.  Advised patient to use Lysol or a disinfectant spray on all shoes in order to eradicate fungal infection inside shoes as well.    Discussed with  the patient etiology flat feet also known as pes planus.  Discussed with the patient that there is congenital and adult acquired pes planus.  Discussed with the patient that this involves the collapse of the medial arch secondary to foot structure as well as posterior tibial tendon dysfunction.  Discussed with the patient that this flatfoot condition causes patient to ambulate in an over pronated position causing tension on the posterior tibial tendon.  Discussed with the patient that pes planus condition is a common condition and is usually treated conservatively depending on severity of pain and arthritis to the associated joints.  Discussed with patient conservative treatment involves activity modifications involving less prolonged walking or standing, weight loss in order to create less aggravation to the medial arch, orthotic devices in order to support the arch, NSAIDs in order to decrease pain and inflammation, physical therapy in order to improve strength of posterior tibial tendon, shoe modification in order to support the arch and prevent overpronation, or AFO devices such as Ritchie braces depending on condition.     Discussed with the patient etiology of sinus tarsi syndrome.  Discussed with the patient that sinus tarsi syndrome is due to inflammation of the sinus tarsi space secondary to persistent trauma i.e. ankle sprains or flat or over pronated foot.  Discussed with the patient that this could cause swelling and persistent pain to the area due to compression between the talus and calcaneus.  Discussed with the patient conservative measures for this condition includes use of NSAIDs to decrease pain and inflammation, use of orthotic devices in order to stabilize the subtalar joint (dispensed Powerstep inserts and encouraged daily use), possible immobilization if pain is severe, and steroid injection into the area to decrease pain and inflammation. We discussed undergoing a steroid injection today.  Risks were reviewed including infection, continued pain, allergic reaction, bleeding, possible tendon rupture, fat pad atrophy, hypopigmentation and elevated blood sugar in diabetics.   Patient denies injection today.     Discussed with patient etiology of callus formation i.e. the body producing thicker tissue in order to protect boney prominences.  Discussed with patient that callus formation is secondary to pressure from boney prominences and contractures of the digits.  Discussed with patient that treatment will involve reduction of callus formation with debridement, urea cream, and off-loading of the area with supportive shoes and padding.    Discussed with  the patient that the use of Gold bond diabetic cream would promote overall skin health.     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     Rx: custom orthotics to [facility]  Rx ketoconazole     Follow Up:  Return if symptoms worsen or fail to improve.    I am scribing for, and in the presence of, Dr. Kirby Peoples for services provided on 04/09/2024.  Chrystal Carolynne Citron, SCRIBE     Chrystal Carolynne Citron, SCRIBE     I am scribing for, and in the presence of, Dr. Kirby Peoples for services provided on 04/09/2024.  Kylee McCorkle, SCRIBE     [place], [place]    I personally performed the services described in this documentation, as scribed  in my presence, and it is both accurate  and complete.    [employer], DPM     [place], [place]

## 2024-04-11 MED ORDER — AMLODIPINE 10 MG TABLET
10.0000 mg | ORAL_TABLET | Freq: Every day | ORAL | 1 refills | Status: DC
Start: 2024-04-11 — End: 2024-06-27

## 2024-05-10 ENCOUNTER — Ambulatory Visit (INDEPENDENT_AMBULATORY_CARE_PROVIDER_SITE_OTHER): Payer: Self-pay | Admitting: FAMILY MEDICINE

## 2024-05-22 ENCOUNTER — Encounter (INDEPENDENT_AMBULATORY_CARE_PROVIDER_SITE_OTHER): Payer: Self-pay | Admitting: FAMILY MEDICINE

## 2024-05-23 ENCOUNTER — Telehealth (INDEPENDENT_AMBULATORY_CARE_PROVIDER_SITE_OTHER): Payer: Self-pay | Admitting: FAMILY MEDICINE

## 2024-05-23 NOTE — Telephone Encounter (Signed)
 [name] to [facility] Dustin Webster Clinical Support (supporting Dustin Sauer, MD) (Selected Message)        05/23/24 10:32 AM  Sore throat, coughing,   runny nose.  no fever.   I have taken paxlovid  two times in the past.   If there are interactions I should worry about then I guess I should avoid taking it.  I am not that sick.

## 2024-05-23 NOTE — Telephone Encounter (Signed)
 What are your symptoms? Paxlovid  has major interactions with Eliquis  and silodosin . Have you taken paxlovid  in the past?

## 2024-05-23 NOTE — Telephone Encounter (Addendum)
 MyChart message sent to patient  Montine Sing, [place] 05/23/2024 10:47         MyChart message reviewed by patient  Last read by Tanda JONETTA Dearth at 10:47AM on 05/23/2024.   Juanluis Guastella, MA 05/23/2024 10:59

## 2024-05-23 NOTE — Telephone Encounter (Signed)
 Yes, I would avoid it. Supportive care with Rest, Hydration, Over-the-counter pain relievers (e.g., acetaminophen , ibuprofen), Decongestants, Cough suppressants  for now. Call us  if symptoms worsen.

## 2024-05-23 NOTE — Telephone Encounter (Signed)
 [name] to [facility] Dustin Webster Clinical Support (supporting Dustin Sauer, MD) (Selected Message)        05/22/24  6:44 PM  I have an appointment on Friday at 4 pm.  I have been sick for 3 days. I just used a home kit and tested positive for Covid.   Please cancel my appointment and call me in a plaxlovid prescription.    I  have had covid 3 times before so I know the drill. I will reschedule my 6 month appointment.  Do you normally want patients to get blood work before an appointment or after? If before let me know the order is in and next week I will get the blood drawn at the lab.   THANKS

## 2024-05-24 ENCOUNTER — Other Ambulatory Visit (INDEPENDENT_AMBULATORY_CARE_PROVIDER_SITE_OTHER): Payer: Self-pay | Admitting: FAMILY MEDICINE

## 2024-05-24 ENCOUNTER — Ambulatory Visit (INDEPENDENT_AMBULATORY_CARE_PROVIDER_SITE_OTHER): Admitting: FAMILY MEDICINE

## 2024-05-24 DIAGNOSIS — I480 Paroxysmal atrial fibrillation: Secondary | ICD-10-CM

## 2024-05-24 NOTE — Telephone Encounter (Signed)
 Last visit with PCP was Visit date not found.    Last office visit in the department was 11/10/2023 .  Currently scheduled future appointment in the department is Visit date not found.    [place], [place], 05/24/2024 , 07:06

## 2024-06-04 ENCOUNTER — Other Ambulatory Visit (HOSPITAL_BASED_OUTPATIENT_CLINIC_OR_DEPARTMENT_OTHER): Payer: Self-pay

## 2024-06-10 ENCOUNTER — Encounter (INDEPENDENT_AMBULATORY_CARE_PROVIDER_SITE_OTHER): Payer: Self-pay | Admitting: Internal Medicine

## 2024-06-20 ENCOUNTER — Ambulatory Visit: Attending: FAMILY MEDICINE

## 2024-06-20 ENCOUNTER — Other Ambulatory Visit: Payer: Self-pay

## 2024-06-21 ENCOUNTER — Ambulatory Visit: Attending: FAMILY MEDICINE

## 2024-06-21 ENCOUNTER — Telehealth (INDEPENDENT_AMBULATORY_CARE_PROVIDER_SITE_OTHER): Payer: Self-pay | Admitting: FAMILY MEDICINE

## 2024-06-21 ENCOUNTER — Encounter (INDEPENDENT_AMBULATORY_CARE_PROVIDER_SITE_OTHER): Payer: Self-pay | Admitting: FAMILY MEDICINE

## 2024-06-21 DIAGNOSIS — E611 Iron deficiency: Secondary | ICD-10-CM | POA: Insufficient documentation

## 2024-06-21 DIAGNOSIS — N1831 Chronic kidney disease, stage 3a: Secondary | ICD-10-CM

## 2024-06-21 DIAGNOSIS — E782 Mixed hyperlipidemia: Secondary | ICD-10-CM

## 2024-06-21 LAB — COMPREHENSIVE METABOLIC PANEL, NON-FASTING
ALBUMIN: 4.4 g/dL (ref 3.5–5.0)
ALKALINE PHOSPHATASE: 29 U/L — ABNORMAL LOW (ref 38–126)
ALT (SGPT): 29 U/L (ref <=50)
ANION GAP: 10 mmol/L
AST (SGOT): 24 U/L (ref 17–59)
BILIRUBIN TOTAL: 0.7 mg/dL (ref 0.2–1.3)
BUN/CREA RATIO: 17
BUN: 26 mg/dL — ABNORMAL HIGH (ref 9–20)
CALCIUM: 9.4 mg/dL (ref 8.4–10.2)
CHLORIDE: 104 mmol/L (ref 98–107)
CO2 TOTAL: 24 mmol/L (ref 22–30)
CREATININE: 1.56 mg/dL — ABNORMAL HIGH (ref 0.66–1.25)
ESTIMATED GFR: 48 mL/min/1.73mˆ2 — ABNORMAL LOW (ref >=60)
GLUCOSE: 186 mg/dL — ABNORMAL HIGH (ref 74–106)
POTASSIUM: 4.4 mmol/L (ref 3.5–5.1)
PROTEIN TOTAL: 6.8 g/dL (ref 6.3–8.2)
SODIUM: 138 mmol/L (ref 137–145)

## 2024-06-21 LAB — CBC WITH DIFF
BASOPHIL #: <0.1 x10ˆ3/uL (ref <=0.20)
BASOPHIL %: 1.3 %
EOSINOPHIL #: 0.41 x10ˆ3/uL (ref <=0.50)
EOSINOPHIL %: 7.5 %
HCT: 45.6 % (ref 38.9–52.0)
HGB: 14.7 g/dL (ref 13.4–17.5)
IMMATURE GRANULOCYTE #: <0.1 x10ˆ3/uL (ref <0.10)
IMMATURE GRANULOCYTE %: 0.9 % (ref 0.0–1.0)
LYMPHOCYTE #: 2.18 x10ˆ3/uL (ref 1.00–4.80)
LYMPHOCYTE %: 39.9 %
MCH: 28.4 pg (ref 26.0–32.0)
MCHC: 32.2 g/dL (ref 31.0–35.5)
MCV: 88.2 fL (ref 78.0–100.0)
MONOCYTE #: 0.57 x10ˆ3/uL (ref 0.20–1.10)
MONOCYTE %: 10.4 %
MPV: 12.5 fL (ref 8.7–12.5)
NEUTROPHIL #: 2.18 x10ˆ3/uL (ref 1.50–7.70)
NEUTROPHIL %: 40 %
PLATELETS: 172 x10ˆ3/uL (ref 150–400)
RBC: 5.17 x10ˆ6/uL (ref 4.50–6.10)
RDW-CV: 13.6 % (ref 11.5–15.5)
WBC: 5.5 x10ˆ3/uL (ref 3.7–11.0)

## 2024-06-21 LAB — URINALYSIS, MACROSCOPIC
BILIRUBIN: NEGATIVE mg/dL
BLOOD: NEGATIVE mg/dL
KETONES: NEGATIVE mg/dL
LEUKOCYTES: NEGATIVE WBCs/uL
NITRITE: NEGATIVE
PH: 6 (ref 5.0–8.0)
PROTEIN: NEGATIVE mg/dL
SPECIFIC GRAVITY: 1.015 (ref 1.005–1.030)
UROBILINOGEN: 0.2 mg/dL

## 2024-06-21 LAB — PHOSPHORUS: PHOSPHORUS: 3.3 mg/dL (ref 2.5–4.5)

## 2024-06-21 LAB — MICROALBUMIN/CREATININE RATIO, URINE, RANDOM
CREATININE RANDOM URINE: 141 mg/dL (ref 100–200)
MICROALBUMIN RANDOM URINE: 0.7 mg/dL (ref 0.0–1.7)
MICROALBUMIN/CREATININE RATIO RANDOM URINE: 5 mg/g (ref 0.0–30.0)

## 2024-06-21 LAB — URIC ACID: URIC ACID: 5 mg/dL (ref 3.5–8.5)

## 2024-06-21 LAB — FERRITIN: FERRITIN: 144 ng/mL (ref 18–464)

## 2024-06-21 LAB — VITAMIN D 25 TOTAL: VITAMIN D 25, TOTAL: 40.2 ng/mL (ref 30.00–100.00)

## 2024-06-21 LAB — MAGNESIUM: MAGNESIUM: 2 mg/dL (ref 1.6–2.3)

## 2024-06-21 LAB — IRON TRANSFERRIN AND TIBC
IRON (TRANSFERRIN) SATURATION: 30 % (ref 20–55)
IRON: 112 ug/dL (ref 49–181)
TOTAL IRON BINDING CAPACITY: 370 ug/dL (ref 261–462)

## 2024-06-21 LAB — PROTEIN/CREATININE RATIO, URINE, RANDOM
CREATININE RANDOM URINE: 141 mg/dL (ref 100–200)
PROTEIN RANDOM URINE: 6 mg/dL (ref 0–12)
PROTEIN/CREATININE RATIO RANDOM URINE: 0.043 mg/mg (ref 0.000–0.150)

## 2024-06-21 LAB — PARATHYROID HORMONE (PTH): PTH: 55.3 pg/mL — ABNORMAL HIGH (ref 7.5–53.5)

## 2024-06-21 NOTE — Telephone Encounter (Signed)
 Do you want to order labs prior to appt or wait to order at the appt?    [address] [place], [place]  06/21/2024, 12:55      Blood Work  (Newest Message First)             Dustin Webster to [facility] [name] Clinical Support (supporting [name], MD) (Selected Message)        06/21/24 12:43 PM  Hi,  This morning I went to the lab to get blood work for next weeks appointment. There was nothing in the computer for me from your office. There was for my kidney doctor whom I also have an appointment next week.   If you want blood work before the appointment please submit and let me know.  THANKS

## 2024-06-21 NOTE — Ancillary Notes (Signed)
 [facility] [place]    Venipuncture performed in office on left arm antecubital vein, dry pressure dressing was applied to site and patient tolerated it well.  Specimen was centrifuged, aliquoted as needed and specimen was labeled and packaged for transport.    Rosaline MARLA Bing  06/21/2024, 07:22

## 2024-06-21 NOTE — Telephone Encounter (Signed)
 Okay to order thyroid , A1C and lipid

## 2024-06-21 NOTE — Telephone Encounter (Signed)
 Pt notified by [employer]. Lab order's placed. Awaiting Pt to review message.     [address] [place], [place]  06/21/2024, 15:43

## 2024-06-24 ENCOUNTER — Ambulatory Visit: Attending: Family Medicine

## 2024-06-24 ENCOUNTER — Other Ambulatory Visit: Payer: Self-pay

## 2024-06-24 DIAGNOSIS — N1831 Chronic kidney disease, stage 3a: Secondary | ICD-10-CM | POA: Insufficient documentation

## 2024-06-24 DIAGNOSIS — E782 Mixed hyperlipidemia: Secondary | ICD-10-CM | POA: Insufficient documentation

## 2024-06-24 DIAGNOSIS — E1122 Type 2 diabetes mellitus with diabetic chronic kidney disease: Secondary | ICD-10-CM | POA: Insufficient documentation

## 2024-06-24 LAB — HGA1C (HEMOGLOBIN A1C WITH EST AVG GLUCOSE)
ESTIMATED AVERAGE GLUCOSE: 197 mg/dL
HEMOGLOBIN A1C: 8.5 % — ABNORMAL HIGH (ref <5.7)

## 2024-06-24 LAB — LIPID PANEL
CHOLESTEROL: 78 mg/dL (ref 0–199)
HDL CHOL: 33 mg/dL — ABNORMAL LOW (ref 40–60)
LDL CALC: 1 mg/dL (ref 0–130)
TRIGLYCERIDES: 218 mg/dL — ABNORMAL HIGH (ref 0–149)

## 2024-06-24 LAB — THYROID STIMULATING HORMONE WITH FREE T4 REFLEX: TSH: 2.9 u[IU]/mL (ref 0.465–4.680)

## 2024-06-24 NOTE — Telephone Encounter (Signed)
 MyChart message reviewed by patient  Last read by Tanda JONETTA Dearth at 5:07PM on 06/21/2024.   Jarret Torre, MA 06/24/2024 08:02

## 2024-06-24 NOTE — Ancillary Notes (Signed)
 Dustin Webster [place]    Venipuncture performed in office on left arm antecubital vein, dry pressure dressing was applied to site and patient tolerated it well.  Specimen was centrifuged, aliquoted as needed and specimen was labeled and packaged for transport.    Zakaria Sedor, PHLEBOTOMIST  06/24/2024, 07:17

## 2024-06-27 ENCOUNTER — Encounter (INDEPENDENT_AMBULATORY_CARE_PROVIDER_SITE_OTHER): Payer: Self-pay

## 2024-06-27 ENCOUNTER — Other Ambulatory Visit: Payer: Self-pay

## 2024-06-27 ENCOUNTER — Ambulatory Visit: Attending: FAMILY MEDICINE | Admitting: FAMILY MEDICINE

## 2024-06-27 ENCOUNTER — Encounter (INDEPENDENT_AMBULATORY_CARE_PROVIDER_SITE_OTHER): Payer: Self-pay | Admitting: FAMILY MEDICINE

## 2024-06-27 ENCOUNTER — Ambulatory Visit (INDEPENDENT_AMBULATORY_CARE_PROVIDER_SITE_OTHER)

## 2024-06-27 ENCOUNTER — Telehealth (INDEPENDENT_AMBULATORY_CARE_PROVIDER_SITE_OTHER): Payer: Self-pay | Admitting: FAMILY MEDICINE

## 2024-06-27 VITALS — BP 107/67 | HR 73 | Ht 74.75 in | Wt 229.0 lb

## 2024-06-27 VITALS — BP 104/68 | HR 68 | Temp 97.4°F | Resp 15 | Ht 74.75 in | Wt 229.0 lb

## 2024-06-27 DIAGNOSIS — Z0000 Encounter for general adult medical examination without abnormal findings: Secondary | ICD-10-CM | POA: Insufficient documentation

## 2024-06-27 DIAGNOSIS — F321 Major depressive disorder, single episode, moderate: Secondary | ICD-10-CM | POA: Insufficient documentation

## 2024-06-27 DIAGNOSIS — E559 Vitamin D deficiency, unspecified: Secondary | ICD-10-CM

## 2024-06-27 DIAGNOSIS — N1832 Chronic kidney disease, stage 3b: Secondary | ICD-10-CM | POA: Insufficient documentation

## 2024-06-27 DIAGNOSIS — I129 Hypertensive chronic kidney disease with stage 1 through stage 4 chronic kidney disease, or unspecified chronic kidney disease: Secondary | ICD-10-CM

## 2024-06-27 DIAGNOSIS — E1122 Type 2 diabetes mellitus with diabetic chronic kidney disease: Secondary | ICD-10-CM | POA: Insufficient documentation

## 2024-06-27 DIAGNOSIS — Z7985 Long-term (current) use of injectable non-insulin antidiabetic drugs: Secondary | ICD-10-CM

## 2024-06-27 DIAGNOSIS — C4321 Malignant melanoma of right ear and external auricular canal: Secondary | ICD-10-CM | POA: Insufficient documentation

## 2024-06-27 DIAGNOSIS — E611 Iron deficiency: Secondary | ICD-10-CM

## 2024-06-27 DIAGNOSIS — N1831 Chronic kidney disease, stage 3a: Secondary | ICD-10-CM | POA: Insufficient documentation

## 2024-06-27 MED ORDER — SEMAGLUTIDE 0.25 MG OR 0.5 MG (2 MG/3 ML) SUBCUTANEOUS PEN INJECTOR
PEN_INJECTOR | SUBCUTANEOUS | 0 refills | Status: DC
Start: 2024-06-27 — End: 2024-08-08

## 2024-06-27 MED ORDER — AMLODIPINE 5 MG TABLET
10.0000 mg | ORAL_TABLET | Freq: Every day | ORAL | Status: DC
Start: 2024-06-27 — End: 2024-10-01

## 2024-06-27 NOTE — Nursing Note (Signed)
 06/27/24 1436   Housing Stability   What is your housing situation? Has Housing   Are you worried about losing your housing? No   Health Education and Literacy   How often do you have a problem understanding what is told to you about your medical condition?  Never   Employment   What is your current work situation? Other unemp.   Financial   In the past year, have you or any family members you live with been unable to get any of the following when it was really needed?  No   Transportation   Has lack of transportation kept you from medical appointments, meetings, work, or from getting things needed for daily living?  No   Social Connections   How often do you see or talk to people that you care about and feel close to? (For example: talking to friends on the phone, visiting friends or family, going to church or club meetings) >=5x a wk   Intimate Partner Violence   In the past year, have you been afraid of your partner or ex-partner? No   Do you feel physically safe and emotionally safe where you currently live? Yes   Help Needed   Would you like help with any of these needs? No   Are any of these needs urgent? No

## 2024-06-27 NOTE — Telephone Encounter (Signed)
 Dustin Webster [place] to [facility] [name] Clinical Support (supporting [name], MD) (Selected Message)        06/27/24  3:35 PM  Per your  phone message my Prescription coverage is:     [employer] SilverScript Plus (PDP) Medicare Rx     RXBIN: [id number]  RXPCN:  [id number]  RXRPBETHA [name] ([id number]): [id number]  ID: [id number]  Name: Dustin Webster. [place]     Have a blessed Day!

## 2024-06-27 NOTE — Nursing Note (Signed)
 06/27/24 1400   Medicare Wellness Assessment   Medicare initial or wellness physical in the last year? No   Advance Directives   Does patient have a living will or MPOA No   Advance directive information given to the patient today? Yes   Activities of Daily Living   Do you need help with dressing, bathing, or walking? No   Do you need help with shopping, housekeeping, medications, or finances? No   Do you have rugs in hallways, broken steps, or poor lighting? No   Do you have grab bars in your bathroom, non-slip strips in your tub, and hand rails on your stairs? Yes   Cognitive Function Screen   What is you age? 1   What is the time to the nearest hour? 1   What is the year? 1   What is the name of this clinic? 1   Can the patient recognize two persons (the doctor, the nurse, home help, etc.)? 1   What is the date of your birth? (day and month sufficient)  1   In what year did World War II end? 1   Who is the current president of the [place] ? 1   Count from 20 down to 1? 1   What address did I give you earlier? 1   Total Score 10   Depression Screen   Little interest or pleasure in doing things. 0   Feeling down, depressed, or hopeless 1   PHQ 2 Total 1   Pain Score   Pain Score Zero   Substance Use Screening   In Past 12 MONTHS, how often have you used any tobacco product (for example, cigarettes, e-cigarettes, cigars, pipes, or smokeless tobacco)? Never   In the PAST 12 MONTHS, how often have you had 5 (men)/4 (women) or more drinks containing alcohol in one day? Never   In the PAST 12 months, how often have you used any prescription medications just for the feeling, more than prescribed, or that were not prescribed for you? Prescriptions may include: opioids, benzodiazepines, medications for ADHD Never   In the PAST 12 MONTHS, how often have you used any drugs, including marijuana, cocaine or crack, heroin, methamphetamine, hallucinogens, ecstasy/MDMA? Never   Hearing Screen   Have you noticed any  hearing difficulties? Yes   After whispering 9-1-6 how many numbers did the patient repeat correctly? 3   Fall Risk Assessment   Do you feel unsteady when standing or walking? Yes   Do you worry about falling? Yes   Have you fallen in the past year? Yes   How many times have you fallen? Once   Were you ever injured from falling? Yes   Timed up and go test (in seconds) 15   Urinary Incontinence Screen   Do you ever leak urine when you don't want to? No

## 2024-06-27 NOTE — Progress Notes (Signed)
 [facility]  [place]  [address] [place] [address]  Operated by [facility]     Name: Dustin Webster MRN:  [id number]   Date: 06/27/2024 Age: 68 y.o.         ANNUAL MEDICARE WELLNESS EXAM   And Problem Focused Visit      Reason for Visit: Medicare Annual, Fatigue (Pt has started Vitamin B12 which has helped. ), Lab Results (Discuss results from 06/24/2024), and Low Blood Pressure (Pt states at Cardiology and nephrologist his BP was around 107/60. Cardiology suggested that the Pt cut back on BP medications. He would like to discus this. )    History of Present Illness:  Dustin Webster is a 69 y.o. male who is being seen today for medicare wellness and chronic conditions.     Problem Focused HPI:  Problem   Type 2 Diabetes Mellitus With Diabetic Chronic Kidney Disease    On farxiga  10 mg daily   Had diarrhea with metformin    Lab Results   Component Value Date    HA1C 8.5 (H) 06/24/2024            Moderate Major Depression (Cms Hcc)    On Celexa   Mood is stable      Pericardial Effusion (Resolved)    History: recent [facility] CABG x 4 on 12/30/2015 post op on [facility] Lovenox  and coumadin for hx of DVT/PE. Readmitted 01/16/2016. ECHO significant for Large pocket of fluid behind the left atrium (posterior and lateral. Fluid compressing the LA. CT tomographic imaging is recommended if indicated clinically.   Assessment: 01/16/2016 s/p Full sternotomy, chest exploration and washout (pericardial window attempted but too many adhesions)     Plan:  2 blakes removed after post op echo satisfactory           Medicare Wellness HPI:    Diet History: diabetic  Exercise History: active     Social Concerns: none     Comprehensive Health Assessment:  Paper document COMPREHENSIVE HEALTH ASSESSMENT reviewed and scanned into medical record    I have reviewed and updated as appropriate the past medical, family and social history. 06/27/2024 as summarized below:  Past Medical History:   Diagnosis Date    Anemia      Atelectasis 12/31/2015    Formatting of this note might be different from the original.  History: post-op  Assessment: bibasilar atelectasis on CXR L>R.  Plan: Encourage ambulation, C&DB, PEP, pain control.      Atrial fibrillation     Blockage of coronary artery of heart (CMS HCC)     BPH associated with nocturia     CAD (coronary artery disease)     Celiac disease     Chest pain 05/07/2018    Chronic depression 05/23/2018    Chronic gout 01/02/2016    Was on allopurinol  but stopped due to CKD   Never had a flare up       CKD (chronic kidney disease)     COVID-19 08/03/2020    Diet-controlled diabetes mellitus (CMS HCC) 05/23/2018    DVT (deep venous thrombosis)     Gastroesophageal reflux disease without esophagitis 05/23/2018    Insomnia 05/23/2018    Leg cramps, sleep related 05/23/2018    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  A/P  - Previously poorly controlled with fentanyl PCA,  improved when switched to dilaudid; continue scheduled lidoderm  patches and tyenol, prn oxycodone; goal pain score <4.      Pericardial effusion     Pulmonary emboli     Sleep apnea      Past Surgical History:   Procedure Laterality Date    Hx cholecystectomy      Hx coronary artery bypass graft      Hx heart surgery  12/2015    Hx hernia repair      Hx lipoma resection      Orif hip fracture Left 2024    Other surgical history Left 01/2016    Skin cancer excision  06/21/2013    Transurethral microwave therapy  05/24/2018     Current Outpatient Medications   Medication Sig    alendronate  (FOSAMAX ) 70 mg Oral Tablet Take 1 Tablet (70 mg total) by mouth Every 7 days Indications: osteoporosis in male patient    amLODIPine  (NORVASC ) 5 mg Oral Tablet Take 2 Tablets (10 mg total) by mouth Daily Indications: high blood pressure    calamine Lotion Apply topically Three times a day as needed    Calcium  Carbonate (OS-CAL) 650  mg calcium  (1,625 mg) Oral Tablet Every evening     Cholecalciferol, Vitamin D3, 50 mcg (2,000 unit) Oral Capsule Take by mouth    citalopram  (CELEXA ) 10 mg Oral Tablet Take 1 Tablet (10 mg total) by mouth Daily    dapagliflozin  propanediol (FARXIGA ) 10 mg Oral Tablet Take 1 Tablet (10 mg total) by mouth Once a day Indications: type 2 diabetes mellitus    ELIQUIS  5 mg Oral Tablet TAKE 1 TAB (5 MG TOTAL) BY MOUTH TWICE DAILY FOR BLOOD CLOT IN A DEEP VEIN OF THE EXTREMITIES    evolocumab  (REPATHA  SURECLICK) 140 mg/mL Subcutaneous Pen Injector INJECT 1 ML (140 MG) SUBCUTANEOUSLY EVERY 14 DAYS    famotidine  (PEPCID ) 40 mg Oral Tablet Take 1 Tablet (40 mg total) by mouth Twice daily Indications: gastroesophageal reflux disease    fenofibrate  nanocrystallized (TRICOR ) 145 mg Oral Tablet Take 1 Tablet (145 mg total) by mouth Every morning with breakfast    ferrous sulfate  (FERATAB) 324 mg (65 mg iron ) Oral Tablet, Delayed Release (E.C.) Take 1 Tablet (324 mg total) by mouth    fluticasone  propionate (FLONASE ) 50 mcg/actuation Nasal Spray, Suspension USE 1 SPRAY IN EACH NOSTRIL ONCE A DAY    folic acid  (FOLVITE ) 1 mg Oral Tablet Take 1 Tablet (1 mg total) by mouth Daily    KRILL OIL ORAL Take by mouth    losartan  (COZAAR ) 25 mg Oral Tablet Take 1 Tablet (25 mg total) by mouth Once a day Indications: high blood pressure    LUTEIN ORAL Take by mouth    melatonin 1 mg Oral Tablet As directed    metoprolol  succinate (TOPROL -XL) 25 mg Oral Tablet Sustained Release 24 hr Take 1 Tablet (25 mg total) by mouth Once a day Indications: high blood pressure    nitroGLYCERIN  (NITROSTAT ) 0.4 mg Sublingual Tablet, Sublingual Place 1 Tablet (0.4 mg total) under the tongue Every 5 minutes as needed for Chest pain for 3 doses over 15 minutes    semaglutide  (OZEMPIC ) 0.25 mg or 0.5 mg (2 mg/3 mL) Subcutaneous Pen Injector Inject 0.25 mg under the skin Every 7 days for 28 days, THEN 0.5 mg Every 7 days for 70 days. Indications: type 2 diabetes  mellitus.    silodosin  (RAPAFLO ) 8 mg Oral Capsule Take 1 Capsule (8 mg total) by mouth Daily Indications:  enlarged prostate with urination problem    vit B complex no.12/niacin,B3, (VITAMIN B COMPLEX NO.12-NIACIN ORAL) Take 500 mg by mouth     Family Medical History:       Problem Relation (Age of Onset)    Emphysema Father    Heart Attack General Family Hx    Heart Disease Mother, Brother    Hypertension (High Blood Pressure) Mother    No Known Problems Other, Sister, Son, Son    Stroke Mother, Brother            Social History     Socioeconomic History    Marital status: Married     Spouse name: Jon    Number of children: 1   Occupational History    Occupation: Other Comptroller     Comment: Retired   Tobacco Use    Smoking status: Never    Smokeless tobacco: Never   Vaping Use    Vaping status: Never Used   Substance and Sexual Activity    Alcohol use: Not Currently     Comment: rare    Drug use: Never    Sexual activity: Yes     Partners: Female     Social Determinants of Health     Financial Resource Strain: Low Risk  (06/27/2024)    Financial Resource Strain     SDOH Financial: No   Transportation Needs: Low Risk  (06/27/2024)    Transportation Needs     SDOH Transportation: No   Social Connections: Low Risk  (06/27/2024)    Social Connections     SDOH Social Isolation: 5 or more times a week   Intimate Partner Violence: Low Risk  (06/27/2024)    Intimate Partner Violence     SDOH Domestic Violence: No   Housing Stability: Low Risk  (06/27/2024)    Housing Stability     SDOH Housing Situation: I have housing.     SDOH Housing Worry: No   Health Literacy: Low Risk  (06/27/2024)    Health Literacy     SDOH Health Literacy: Never   Employment Status: Low Risk  (06/27/2024)    Employment Status     SDOH Employment: Otherwise unemployed but not seeking work (ex. Consulting civil engineer, retired, disabled, unpaid primary care giver)         List of Current Health Care Providers   Care Team       PCP       Name Type Specialty  Phone Number    Tisha Hammond, MD Physician [facility] [phone]              Care Team       Name Type Specialty Phone Number    Jonette Norleen BRAVO, MD Physician CARDIOVASCULAR DISEASE [phone]    Raechel Belvie SQUIBB, MD Physician UROLOGY [phone]    Gino Tanda HEATH, MD Physician OPHTHALMOLOGY [phone]    Nanetta Duwaine Caldron, [place] Not available EXTERNAL [phone]    Jakie Barter, FNP-C Nurse Practitioner GASTROENTEROLOGY [phone]                      Health Maintenance   Topic Date Due    Covid-19 Vaccine (6 - 2024-25 season) 07/16/2023    Pneumococcal Vaccination, Age 54+ (3 of 3 - PCV20 or PCV21) 11/27/2023    Influenza Vaccine (1) 07/15/2024    Diabetic A1C  09/24/2024    Diabetic Kidney Health eGFR  06/21/2025    Diabetic Kidney Health Microalb/Cr Ratio  06/21/2025    Medicare Annual Wellness Visit  06/27/2025    Osteoporosis screening  11/02/2025    Prostate Cancer Screening  11/05/2025    Diabetic Retinal Exam  11/09/2025    Colonoscopy  07/17/2030    Adult Tdap-Td (3 - Td or Tdap) 02/19/2032    Hepatitis C screening  Completed    Shingles Vaccine  Completed    RSV Adult 60+ or Pregnancy  Completed     Medicare Wellness Assessment   Medicare initial or wellness physical in the last year?: No  Advance Directives   Does patient have a living will or MPOA: No           Advance directive information given to the patient today?: Yes      Activities of Daily Living   Do you need help with dressing, bathing, or walking?: No   Do you need help with shopping, housekeeping, medications, or finances?: No   Do you have rugs in hallways, broken steps, or poor lighting?: No   Do you have grab bars in your bathroom, non-slip strips in your tub, and hand rails on your stairs?: Yes   Cognitive Function Screen (1=Yes, 0=No)   What is you age?: Correct   What is the time to the nearest hour?: Correct   What is the year?: Correct   What is the name of this clinic?: Correct   Can the patient recognize two  persons (the doctor, the nurse, home help, etc.)?: Correct   What is the date of your birth? (day and month sufficient) : Correct   In what year did World War II end?: Correct   Who is the current president of the [place] ?: Correct   Count from 20 down to 1?: Correct   What address did I give you earlier?: Correct   Total Score: 10       Fall Risk Screen   Do you feel unsteady when standing or walking?: Yes  Do you worry about falling?: Yes  Have you fallen in the past year?: Yes  How many times have you fallen?: Once  Were you ever injured from falling?: Yes  Timed up and go test (in seconds): 15   Depression Screen     Little interest or pleasure in doing things.: Not at all  Feeling down, depressed, or hopeless: Several Days  PHQ 2 Total: 1     Pain Score   Pain Score:   0 - No pain    Substance Use-Abuse Screening     Tobacco Use     In Past 12 MONTHS, how often have you used any tobacco product (for example, cigarettes, e-cigarettes, cigars, pipes, or smokeless tobacco)?: Never     Alcohol use     In the PAST 12 MONTHS, how often have you had 5 (men)/4 (women) or more drinks containing alcohol in one day?: Never     Prescription Drug Use     In the PAST 12 months, how often have you used any prescription medications just for the feeling, more than prescribed, or that were not prescribed for you? Prescriptions may include: opioids, benzodiazepines, medications for ADHD: Never           Illicit Drug Use   In the PAST 12 MONTHS, how often have you used any drugs, including marijuana, cocaine or crack, heroin, methamphetamine, hallucinogens, ecstasy/MDMA?: Never        Hearing Screen   Have you noticed any hearing difficulties?: Yes  After  whispering 9-1-6 how many numbers did the patient repeat correctly?: 3       Vision Screen                                    Physical Exam:  BP 104/68 (Site: Left Arm, Patient Position: Sitting, Cuff Size: Adult Small)   Pulse 68   Temp 36.3 C (97.4 F) (Temporal)    Resp 15   Ht 1.899 m (6' 2.75)   Wt 104 kg (229 lb)   SpO2 97%   BMI 28.81 kg/m       Physical Exam  Constitutional:       Appearance: Normal appearance.   Cardiovascular:      Rate and Rhythm: Normal rate and regular rhythm.      Pulses: Normal pulses.      Heart sounds: Normal heart sounds. No murmur heard.     No gallop.   Pulmonary:      Effort: Pulmonary effort is normal. No respiratory distress.      Breath sounds: Normal breath sounds. No wheezing or rales.   Abdominal:      General: Abdomen is flat. Bowel sounds are normal. There is no distension.      Palpations: Abdomen is soft.      Tenderness: There is no abdominal tenderness.   Skin:     General: Skin is warm and dry.      Coloration: Skin is not jaundiced.   Neurological:      General: No focal deficit present.      Mental Status: He is alert and oriented to person, place, and time. Mental status is at baseline.   Psychiatric:         Mood and Affect: Mood normal.         Behavior: Behavior normal.             Fall Risk Follow up plan of care: Medications managed to minimize fall risk        Advanced Directives: Patient agreeable to - Advanced Directives discussed and patient elected to take home to review.          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 Written Prevention Plan completed/ updated and given to patient (see After Visit Summary).      Follow up: Return in about 3 months (around 09/27/2024) for diabetes .  Seek medical attention for new or worsening symptoms.  Patient has been seen in this clinic within the last 3 years.     [name], MD          This note was partially created using MModal Fluency Direct system (voice recognition software) and is inherently subject to errors including those of syntax and sound-alike substitutions which may escape proofreading.  In such instances, original meaning may be extrapolated by contextual derivation.

## 2024-06-27 NOTE — Progress Notes (Signed)
 Name: Dustin Webster MRN:  [id number]   Date: 06/27/2024 Age: 68 y.o.      NAME: Dustin Webster  AGE: 68 y.o.  MRN: [id number]  DATE:06/27/2024    REASON FOR CONSULT: CKD Stage 3 (Follow Up 56m)       HISTORY OF PRESENTING ILLNESS:  Dustin Webster is a 68 y.o. y/o male presenting to establish care for CKD3b.      Subjective - patient denies any new issues or concerns.  Patient does complain of chronic fatigue.  Patient says PCP recently started him on vitamin B12 and has seen some improvement in fatigue.  Has been some concern for hypotension.  Blood pressure in clinic today was 107/67 mm Hg.  Patient denies any chest pain, cough, shortness of breath.  Patient denies any abdominal pain, nausea, vomiting, diarrhea.  Patient denied any urinary issues.    MEDICATION LIST:  Current Outpatient Medications   Medication Sig    alendronate  (FOSAMAX ) 70 mg Oral Tablet Take 1 Tablet (70 mg total) by mouth Every 7 days Indications: osteoporosis in male patient    amLODIPine  (NORVASC ) 10 mg Oral Tablet Take 1 Tablet (10 mg total) by mouth Daily    calamine Lotion Apply topically Three times a day as needed    Calcium  Carbonate (OS-CAL) 650 mg calcium  (1,625 mg) Oral Tablet Every evening     Cholecalciferol, Vitamin D3, 50 mcg (2,000 unit) Oral Capsule Take by mouth    citalopram  (CELEXA ) 10 mg Oral Tablet Take 1 Tablet (10 mg total) by mouth Daily    dapagliflozin  propanediol (FARXIGA ) 10 mg Oral Tablet Take 1 Tablet (10 mg total) by mouth Once a day Indications: type 2 diabetes mellitus    ELIQUIS  5 mg Oral Tablet TAKE 1 TAB (5 MG TOTAL) BY MOUTH TWICE DAILY FOR BLOOD CLOT IN A DEEP VEIN OF THE EXTREMITIES    evolocumab  (REPATHA  SURECLICK) 140 mg/mL Subcutaneous Pen Injector INJECT 1 ML (140 MG) SUBCUTANEOUSLY EVERY 14 DAYS    famotidine  (PEPCID ) 40 mg Oral Tablet Take 1 Tablet (40 mg total) by mouth Twice daily Indications: gastroesophageal reflux disease    fenofibrate  nanocrystallized (TRICOR ) 145 mg Oral Tablet Take 1 Tablet  (145 mg total) by mouth Every morning with breakfast    ferrous sulfate  (FERATAB) 324 mg (65 mg iron ) Oral Tablet, Delayed Release (E.C.) Take 1 Tablet (324 mg total) by mouth    fluticasone  propionate (FLONASE ) 50 mcg/actuation Nasal Spray, Suspension USE 1 SPRAY IN EACH NOSTRIL ONCE A DAY    KRILL OIL ORAL Take by mouth    losartan  (COZAAR ) 25 mg Oral Tablet Take 1 Tablet (25 mg total) by mouth Once a day Indications: high blood pressure    LUTEIN ORAL Take by mouth    melatonin 1 mg Oral Tablet As directed    metoprolol  succinate (TOPROL -XL) 25 mg Oral Tablet Sustained Release 24 hr Take 1 Tablet (25 mg total) by mouth Once a day Indications: high blood pressure    nitroGLYCERIN  (NITROSTAT ) 0.4 mg Sublingual Tablet, Sublingual Place 1 Tablet (0.4 mg total) under the tongue Every 5 minutes as needed for Chest pain for 3 doses over 15 minutes    silodosin  (RAPAFLO ) 8 mg Oral Capsule Take 1 Capsule (8 mg total) by mouth Daily Indications: enlarged prostate with urination problem    vit B complex no.12/niacin,B3, (VITAMIN B COMPLEX NO.12-NIACIN ORAL) Take 500 mg by mouth       ALLERGIES:  Allergies   Allergen Reactions  Gluten      Celiac disease    Statin [Atorvastatin] Myalgia     All statin medications       PAST MEDICAL HISTORY:  Past Medical History:   Diagnosis Date    Anemia     Atelectasis 12/31/2015    Formatting of this note might be different from the original.  History: post-op  Assessment: bibasilar atelectasis on CXR L>R.  Plan: Encourage ambulation, C&DB, PEP, pain control.      Atrial fibrillation     Blockage of coronary artery of heart (CMS HCC)     BPH associated with nocturia     CAD (coronary artery disease)     Celiac disease     Chest pain 05/07/2018    Chronic depression 05/23/2018    Chronic gout 01/02/2016    Was on allopurinol  but stopped due to CKD   Never had a flare up       CKD (chronic kidney disease)     COVID-19 08/03/2020    Diet-controlled diabetes mellitus (CMS HCC) 05/23/2018     DVT (deep venous thrombosis)     Gastroesophageal reflux disease without esophagitis 05/23/2018    Insomnia 05/23/2018    Leg cramps, sleep related 05/23/2018    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  A/P  - Previously poorly controlled with fentanyl PCA, improved when switched to dilaudid; continue scheduled lidoderm  patches and tyenol, prn oxycodone; goal pain score <4.      Pericardial effusion     Pulmonary emboli     Sleep apnea            PAST SURGICAL HISTORY:  Past Surgical History:   Procedure Laterality Date    HX CHOLECYSTECTOMY      HX CORONARY ARTERY BYPASS GRAFT      HX HEART SURGERY  12/2015    HX HERNIA REPAIR      Inguinal     HX LIPOMA RESECTION      Chest    ORIF HIP FRACTURE Left 2024    OTHER SURGICAL HISTORY Left 01/2016    Evacuation of hematoma -Left At[facility]  at [facility]     SKIN CANCER EXCISION  06/21/2013    melanoma/ear/[facility]    TRANSURETHRAL MICROWAVE THERAPY  05/24/2018    Dr Raechel           FAMILY HISTORY:  Family Medical History:       Problem Relation (Age of Onset)    Emphysema Father    Heart Attack General Family Hx    Heart Disease Mother, Brother    Hypertension (High Blood Pressure) Mother    No Known Problems Other, Sister, Son, Son    Stroke Mother, Brother              SOCIAL HISTORY:  Social History     Tobacco Use    Smoking status: Never    Smokeless tobacco: Never   Substance Use Topics    Alcohol use: Not Currently     Comment: rare        REVIEW OF SYSTEMS:  Negative unless otherwise indicated in HPI      PHYSICAL EXAM:     Vitals:    06/27/24 1328   BP: 107/67   Pulse: 73   SpO2: 92%   Weight: 104 kg (229 lb)   Height: 1.899 m (6'  2.75)   BMI: 28.81     GENERAL: Alert, no acyte distress  EYE: PERRLA, EOMI  HEENT: Normocephalic, moist oral mucosa  CARDIOVASCULAR: S1, S2 normal, no murmurs  RESPIRATORY: Chest clear, no rales  GASTROINTESTINAL: Soft, non  tender  GENITOURINARY: Deferred  SKIN: Warm, dry  PSYCHIATRIC: Cooperative, appropriate mood and affect    LABS:  There are no exam notes on file for this visit.    BASIC METABOLIC PANEL  Lab Results   Component Value Date    SODIUM 138 06/21/2024    POTASSIUM 4.4 06/21/2024    CHLORIDE 104 06/21/2024    CO2 24 06/21/2024    ANIONGAP 10 06/21/2024    BUN 26 (H) 06/21/2024    CREATININE 1.56 (H) 06/21/2024    BUNCRRATIO 17 06/21/2024    GFR 48 (L) 06/21/2024    CALCIUM  9.4 06/21/2024    GLUCOSE 3+ (A) 06/21/2024    GLUCOSENF 186 (H) 06/21/2024          CBC  Diff   Lab Results   Component Value Date/Time    WBC 5.5 06/21/2024 07:26 AM    HGB 14.7 06/21/2024 07:26 AM    HCT 45.6 06/21/2024 07:26 AM    PLTCNT 172 06/21/2024 07:26 AM    RBC 5.17 06/21/2024 07:26 AM    MCV 88.2 06/21/2024 07:26 AM    MCHC 32.2 06/21/2024 07:26 AM    MCH 28.4 06/21/2024 07:26 AM    RDW 14.0 06/10/2021 11:16 AM    MPV 12.5 06/21/2024 07:26 AM    Lab Results   Component Value Date/Time    PMNS 40.0 06/21/2024 07:26 AM    LYMPHOCYTES 34 06/10/2021 11:16 AM    EOSINOPHIL 6 06/10/2021 11:16 AM    MONOCYTES 10.4 06/21/2024 07:26 AM    BASOPHILS 1.3 06/21/2024 07:26 AM    BASOPHILS <0.10 06/21/2024 07:26 AM    PMNABS 2.18 06/21/2024 07:26 AM    LYMPHSABS 2.18 06/21/2024 07:26 AM    EOSABS 0.41 06/21/2024 07:26 AM    MONOSABS 0.57 06/21/2024 07:26 AM    BASABS 0.04 01/14/2020 12:08 PM            PTH   Date Value Ref Range Status   06/21/2024 55.3 (H) 7.5 - 53.5 pg/mL Final       No results found for: VANITA DAKINS    FERRITIN   Date Value Ref Range Status   06/21/2024 144 18 - 464 ng/mL Final     IRON  (TRANSFERRIN) SATURATION   Date Value Ref Range Status   06/21/2024 30 20 - 55 % Final     IRON    Date Value Ref Range Status   06/21/2024 112 49 - 181 ug/dL Final       PROTEIN   Date Value Ref Range Status   06/21/2024 Negative Negative mg/dL Final   90/90/7975 Not Detected Not Detected mg/dL Final   90/90/7975 Negative Negative  mg/dL Final     MICROALBUMIN RANDOM URINE   Date Value Ref Range Status   06/21/2024 0.7 0.0 - 1.7 mg/dL Final   90/90/7975 <9.4 No reference intervals are established mg/dL Final   90/90/7975 <9.3 0.0 - 1.7 mg/dL Final       IMAGING:    Recent Results (from the past 824799999 hours)   US  KIDNEY    Collection Time: 08/04/23 11:54 AM    Narrative    EXAMINATION: Ultrasound kidneys    HISTORY: Chronic kidney disease.    Real-time imaging of  the kidneys was performed. Right kidney measures 11.5 cm in length. Left kidney measures 11.0 cm. Echotexture and cortical thickness are grossly normal. There is slight cortical lobulation on the left, suggesting fetal lobation.    There is no hydronephrosis on either side. No calculi are appreciated. Small bilateral renal cysts are present, measuring approximately 1.8 cm on the right and 1 cm on the left.      Impression    1. Small bilateral renal cysts.  2. Otherwise grossly normal ultrasound of the kidneys.      Radiologist location ID: [id number]         Recent Results (from the past [id number] hours)   CT ABDOMEN PELVIS W IV CONTRAST    Collection Time: 03/27/22 12:29 AM    Narrative    CLINICAL DATA:  Polytrauma, blunt [id number].  Fall.    EXAM:  CT CHEST, ABDOMEN, AND PELVIS WITH CONTRAST    TECHNIQUE:  Multidetector CT imaging of the chest, abdomen and pelvis was  performed following the standard protocol during bolus  administration of intravenous contrast.    RADIATION DOSE REDUCTION: This exam was performed according to the  departmental dose-optimization program which includes automated  exposure control, adjustment of the mA and/or kV according to  patient size and/or use of iterative reconstruction technique.    CONTRAST:  80mL OMNIPAQUE IOHEXOL 350 MG/ML SOLN    COMPARISON:  03/21/2022    FINDINGS:  CT CHEST FINDINGS    Cardiovascular: Prior CABG. Heart is normal size. Aorta is normal  caliber.    Mediastinum/Nodes: No mediastinal, hilar, or axillary adenopathy.  Trachea  and esophagus are unremarkable. Thyroid  unremarkable.    Lungs/Pleura: Dependent atelectasis or scarring posteriorly in the  lower lobes. No effusions or pneumothorax. No confluent airspace  opacities.    Musculoskeletal: Chest wall soft tissues are unremarkable. No acute  bony abnormality.    CT ABDOMEN PELVIS FINDINGS    Hepatobiliary: No hepatic injury or perihepatic hematoma. Diffuse  low-density throughout the liver compatible with fatty infiltration.  Prior cholecystectomy.    Pancreas: No focal abnormality or ductal dilatation.    Spleen: No splenic injury or perisplenic hematoma.    Adrenals/Urinary Tract: No adrenal hemorrhage or renal injury  identified. Bladder is unremarkable.    Stomach/Bowel: Normal appendix. Stomach, large and small bowel  grossly unremarkable.    Vascular/Lymphatic: Scattered aortic atherosclerosis. No evidence of  aneurysm or adenopathy.    Reproductive: No visible focal abnormality.    Other: No free fluid or free air.    Musculoskeletal: No acute bony abnormality.    IMPRESSION:  No acute findings or evidence of significant traumatic injury in the  chest, abdomen or pelvis.    Prior CABG.  Aortic atherosclerosis.    Hepatic steatosis.    These results were called by telephone at the time of interpretation  on 03/27/2022 at 12:31 am to provider Dr. Dann Hummer, who  verbally acknowledged these results.      Electronically Signed    By: Franky Crease M.D.    On: 03/27/2022 00:36        No results found for this or any previous visit (from the past 824799999 hours).      ASSESSMENT:    Problem List Items Addressed This Visit          Nephrology    Stage 3 chronic kidney disease - Primary    Relevant Orders    CBC/DIFF    COMPREHENSIVE METABOLIC PANEL, NON-FASTING  MAGNESIUM     PHOSPHORUS    IRON  TRANSFERRIN AND TIBC    FERRITIN    IRON     URINALYSIS, MACROSCOPIC    MICROALBUMIN/CREATININE RATIO, URINE, RANDOM    PARATHYROID  HORMONE (PTH)    VITAMIN D  25 TOTAL    URIC ACID     PROTEIN/CREATININE RATIO, URINE, RANDOM     Other Visit Diagnoses         Iron  deficiency          Vitamin D  deficiency                    1. Chronic Kidney Disease 3aA1 - recent creatinine on labs done on 06/21/2024 showed creatinine 1.56 mg/dl with GFR 48 ML/MIN   Etiology likely secondary to diabetic nephropathy , farxiga  10 mg daily. Is on Cozaar  25 mg for reno protective effects. .  08/03/24 - Right kidney measures 11.5 cm in length. Left kidney measures 11.0 cm. Echotexture and cortical thickness are grossly normal. There is slight cortical lobulation on the left, suggesting fetal lobation.There is no hydronephrosis on either side. No calculi are appreciated. Small bilateral renal cysts are present, measuring approximately 1.8 cm on the right and 1 cm on the left.    2.  Stable Electrolytes - continue to monitor    3. Stable Acid Base Status - continue to monitor bicarb closely    4. Hypertension:  Well controlled on current blood pressure regimen.  Cont on losartan  25 mg daily   Metoprolol  - XL 25 mg daily   Amlodipine  10 mg daily  Recommended low-sodium diet.  Recommend keeping blood pressure log at home and contact in office if systolic blood pressure greater than 150 mm Hg or less than 100 mm Hg.    5. No Anemia:  Hemoglobin 14.7.  Iron  studies shows ferritin 144 and iron  saturation 30 continue to follow repeat hemoglobin and iron  studies.    6. Metabolic Bone Disease:  Stable metabolic bone profile. Patient has a history of celiac disease and is on calcium  replacement  Vitamin D  40.20 and PTH 55.3. Osteoporosis, ok to take fosamax        PLAN:  1.Continue current medication regiment for CKD3b. Obtain blood work including CBC, CMP, UA, Urine Albumin creatinine ratio, PTH, Mag, Phos, Vitamin D  levels today and 6 months later.   2. Continue to monitor labs for electrolyte and acid base status.   3. Continue current antihypertensive regimen  4. Stable metabolic bone disease. Continue to monitor.  5. Follow up  in 3 months.  6. Patient encouraged to drink 60 ounce of water  daily. Encouraged to take low salt, low red meat diet.       FOLLOW UP: Return in about 6 months (around 12/28/2024) for In Person Visit with Dr. Vicky.     I independently of the faculty provider spent a total of (20) minutes in direct/indirect care of this patient including initial evaluation, review of laboratory, radiology, diagnostic studies, review of medical record, order entry and coordination of care.    Damonie Furney, ACNPC-AG  Nephrology

## 2024-06-28 ENCOUNTER — Ambulatory Visit (INDEPENDENT_AMBULATORY_CARE_PROVIDER_SITE_OTHER): Payer: Self-pay | Admitting: FAMILY MEDICINE

## 2024-07-02 NOTE — Assessment & Plan Note (Signed)
 Continue same

## 2024-07-02 NOTE — Assessment & Plan Note (Addendum)
 Will start ozempic , does not need PA   Will recheck A1C in 3 months  Reports improving eating habits and following diabetic diet

## 2024-07-05 ENCOUNTER — Other Ambulatory Visit (INDEPENDENT_AMBULATORY_CARE_PROVIDER_SITE_OTHER): Payer: Self-pay | Admitting: FAMILY MEDICINE

## 2024-07-05 DIAGNOSIS — N1831 Chronic kidney disease, stage 3a: Secondary | ICD-10-CM

## 2024-07-05 DIAGNOSIS — I10 Essential (primary) hypertension: Secondary | ICD-10-CM

## 2024-07-05 NOTE — Telephone Encounter (Signed)
 Last visit with PCP was 06/27/2024.    Last office visit in the department was 06/27/2024 .  Currently scheduled future appointment in the department is 10/02/2024.    [place], [place], 07/05/2024 , 07:33

## 2024-07-15 ENCOUNTER — Other Ambulatory Visit (INDEPENDENT_AMBULATORY_CARE_PROVIDER_SITE_OTHER): Payer: Self-pay | Admitting: FAMILY MEDICINE

## 2024-07-15 DIAGNOSIS — M810 Age-related osteoporosis without current pathological fracture: Secondary | ICD-10-CM

## 2024-07-16 NOTE — Telephone Encounter (Signed)
 Last scheduled appointment with you was 06/27/2024.    Currently scheduled future appointment is 10/02/2024.    [address] [place], [place]  07/16/2024, 07:41

## 2024-07-29 ENCOUNTER — Other Ambulatory Visit: Payer: Self-pay

## 2024-07-29 ENCOUNTER — Encounter (HOSPITAL_BASED_OUTPATIENT_CLINIC_OR_DEPARTMENT_OTHER): Payer: Self-pay

## 2024-07-29 ENCOUNTER — Ambulatory Visit: Payer: Self-pay

## 2024-07-29 ENCOUNTER — Ambulatory Visit (HOSPITAL_BASED_OUTPATIENT_CLINIC_OR_DEPARTMENT_OTHER)
Admission: RE | Admit: 2024-07-29 | Discharge: 2024-07-29 | Disposition: A | Discharge status: Home / Self Care | Source: Ambulatory Visit

## 2024-07-29 VITALS — BP 100/56 | HR 50 | Ht 75.0 in | Wt 226.0 lb

## 2024-07-29 DIAGNOSIS — Z7901 Long term (current) use of anticoagulants: Secondary | ICD-10-CM

## 2024-07-29 DIAGNOSIS — Z951 Presence of aortocoronary bypass graft: Secondary | ICD-10-CM

## 2024-07-29 DIAGNOSIS — I493 Ventricular premature depolarization: Secondary | ICD-10-CM

## 2024-07-29 DIAGNOSIS — I2510 Atherosclerotic heart disease of native coronary artery without angina pectoris: Secondary | ICD-10-CM

## 2024-07-29 DIAGNOSIS — I480 Paroxysmal atrial fibrillation: Secondary | ICD-10-CM

## 2024-07-29 DIAGNOSIS — Z86718 Personal history of other venous thrombosis and embolism: Secondary | ICD-10-CM

## 2024-07-29 DIAGNOSIS — E782 Mixed hyperlipidemia: Secondary | ICD-10-CM

## 2024-07-29 DIAGNOSIS — R5383 Other fatigue: Secondary | ICD-10-CM

## 2024-07-29 DIAGNOSIS — I491 Atrial premature depolarization: Secondary | ICD-10-CM

## 2024-07-29 DIAGNOSIS — I10 Essential (primary) hypertension: Secondary | ICD-10-CM

## 2024-07-29 NOTE — Patient Instructions (Signed)
 Continue same medications    Goal BP 100/60 to 130/80    Goal heart rate 60 to 100    Call or go to the ED for chest pain, shortness of breath, or palpitations

## 2024-07-29 NOTE — Progress Notes (Signed)
 [facility]  [address]  [place] [address]  [phone]    Date:   07/29/2024  Name: Dustin Webster  Age: 68 y.o.    REASON FOR VISIT:   Follow Up 6 Months     HISTORY OF PRESENTING ILLNESS:    Dustin Webster is a 68 y.o. male who presents today for a follow-up appointment with cardiac history significant for CAD status post CABG, hypertension, hyperlipidemia, paroxysmal atrial fibrillation, deep vein thrombosis/PE and type 2 diabetes.  The patient has last seen in the office 01/26/2024 overall doing well from cardiac standpoint.    The patient reports he has been having increased fatigue over the past couple of months which is similar to his prior CABG.  He also reports while checking his blood pressure his monitor says he is having irregular rhythm.  He did have a Holter monitor earlier this year that showed rare PACs and PVCs.  His most recent A1c 8.5.  His most recent LDL was 1. The patient denies chest pain, shortness of breath, palpitations, dizziness, near-syncope, syncope, lower extremity edema, orthopnea or PND.      Current Medications[1]  Allergies[2]  Past Medical History[3]  Past Surgical History[4]  Family Medical History:       Problem Relation (Age of Onset)    Emphysema Father    Heart Attack General Family Hx    Heart Disease Mother, Brother    Hypertension (High Blood Pressure) Mother    No Known Problems Other, Sister, Son, Son    Stroke Mother, Brother            Social History[5]    REVIEW OF SYSTEMS:  Pertinent positives are listed in HPI.     PHYSICAL EXAMINATION:     BP (!) 100/56   Pulse 50   Ht 1.905 m (6' 3)   Wt 103 kg (226 lb)   SpO2 97%   BMI 28.25 kg/m         Physical Exam  Vitals reviewed.   Cardiovascular:      Rate and Rhythm: Normal rate and regular rhythm.   Pulmonary:      Effort: Pulmonary effort is normal.      Breath sounds: Normal breath sounds.   Musculoskeletal:      Right lower leg: No edema.      Left  lower leg: No edema.   Skin:     General: Skin is warm and dry.   Neurological:      Mental Status: He is alert.         ASSESSMENT/PLAN:  1. Coronary artery disease involving native coronary artery of native heart without angina pectoris  Status post CABG.  He does report increased fatigue over the past 2 months that is similar to his previous intervention.  Will perform a nuclear stress test at this time.  Patient made aware of signs and symptoms of when to seek emergency medical attention and voices understanding.  - MYOCARDIAL PERFUSION COMPLETE; Future    2. Paroxysmal atrial fibrillation (CMS HCC)  Patient reports increased fatigue and irregular rhythm on home blood pressure cuff.  He would like to repeat a 7 day monitor.  It was noted to have rare PACs and PVCs on previous monitor.  He is on beta blocker.  He does remain on Eliquis  for thromboembolic protection denies bleeding complications  - 7 DAY EXTENDED HOLTER MONITOR; Future    3. Essential hypertension  Patient  brings in home blood pressure log that shows overall this is well controlled.  Continue current medical therapy with goal blood pressure less than 130/80 but greater than 100 systolic.    4. Mixed hyperlipidemia  Continue Repatha  for goal LDL less than 70 ideally less than 55.  Most recent LDL 1.    5. History of DVT of lower extremity  Continue Eliquis .    Return in about 6 months (around 01/26/2025) for MODI . for continued management of chronic health issues.     Electronically signed by     Chiquita Quinones, PA-C  07/29/2024, 15:33    Any information populated during pre-charting was verified with the patient at the time of the appointment.       This note may have been partially generated using MModal Fluency Direct system, and there may be some incorrect words, spellings, and punctuation that were not noted in checking the note before saving, though effort was made to avoid such errors.          [1]   Current Outpatient Medications    Medication Sig Dispense Refill    alendronate  (FOSAMAX ) 70 mg Oral Tablet TAKE 1 TABLET BY MOUTH EVERY 7 DAYS 12 Tablet 1    amLODIPine  (NORVASC ) 5 mg Oral Tablet Take 2 Tablets (10 mg total) by mouth Daily Indications: high blood pressure (Patient taking differently: Take 1 Tablet (5 mg total) by mouth Daily Indications: high blood pressure)      calamine Lotion Apply topically Three times a day as needed 177 mL 1    Calcium  Carbonate (OS-CAL) 650 mg calcium  (1,625 mg) Oral Tablet Every evening       Cholecalciferol, Vitamin D3, 50 mcg (2,000 unit) Oral Capsule Take by mouth      citalopram  (CELEXA ) 10 mg Oral Tablet Take 1 Tablet (10 mg total) by mouth Daily 90 Tablet 1    dapagliflozin  propanediol (FARXIGA ) 10 mg Oral Tablet Take 1 Tablet (10 mg total) by mouth Once a day Indications: type 2 diabetes mellitus 90 Tablet 1    ELIQUIS  5 mg Oral Tablet TAKE 1 TAB (5 MG TOTAL) BY MOUTH TWICE DAILY FOR BLOOD CLOT IN A DEEP VEIN OF THE EXTREMITIES 180 Tablet 0    evolocumab  (REPATHA  SURECLICK) 140 mg/mL Subcutaneous Pen Injector INJECT 1 ML (140 MG) SUBCUTANEOUSLY EVERY 14 DAYS 6 Each 3    famotidine  (PEPCID ) 40 mg Oral Tablet Take 1 Tablet (40 mg total) by mouth Twice daily Indications: gastroesophageal reflux disease 180 Tablet 1    fenofibrate  nanocrystallized (TRICOR ) 145 mg Oral Tablet Take 1 Tablet (145 mg total) by mouth Every morning with breakfast 90 Tablet 1    ferrous sulfate  (FERATAB) 324 mg (65 mg iron ) Oral Tablet, Delayed Release (E.C.) Take 1 Tablet (324 mg total) by mouth      fluticasone  propionate (FLONASE ) 50 mcg/actuation Nasal Spray, Suspension USE 1 SPRAY IN EACH NOSTRIL ONCE A DAY 48 g 1    folic acid  (FOLVITE ) 1 mg Oral Tablet Take 1 Tablet (1 mg total) by mouth Daily      KRILL OIL ORAL Take by mouth      losartan  (COZAAR ) 25 mg Oral Tablet TAKE 1 TABLET (25 MG TOTAL) BY MOUTH ONCE A DAY INDICATIONS: HIGH BLOOD PRESSURE 90 Tablet 1    LUTEIN ORAL Take by mouth      melatonin 1 mg Oral Tablet  As directed      metoprolol  succinate (TOPROL -XL) 25 mg Oral Tablet Sustained Release  24 hr Take 1 Tablet (25 mg total) by mouth Once a day Indications: high blood pressure 90 Tablet 3    nitroGLYCERIN  (NITROSTAT ) 0.4 mg Sublingual Tablet, Sublingual Place 1 Tablet (0.4 mg total) under the tongue Every 5 minutes as needed for Chest pain for 3 doses over 15 minutes 25 Tablet 3    semaglutide  (OZEMPIC ) 0.25 mg or 0.5 mg (2 mg/3 mL) Subcutaneous Pen Injector Inject 0.25 mg under the skin Every 7 days for 28 days, THEN 0.5 mg Every 7 days for 70 days. Indications: type 2 diabetes mellitus. (Patient not taking: No sig reported) 9 mL 0    silodosin  (RAPAFLO ) 8 mg Oral Capsule Take 1 Capsule (8 mg total) by mouth Daily Indications: enlarged prostate with urination problem 90 Capsule 1    vit B complex no.12/niacin,B3, (VITAMIN B COMPLEX NO.12-NIACIN ORAL) Take 500 mg by mouth (Patient not taking: Reported on 07/29/2024)       No current facility-administered medications for this visit.   [2]   Allergies  Allergen Reactions    Gluten      Celiac disease    Statin [Atorvastatin] Myalgia     All statin medications   [3]   Past Medical History:  Diagnosis Date    Anemia     Atelectasis 12/31/2015    Formatting of this note might be different from the original.  History: post-op  Assessment: bibasilar atelectasis on CXR L>R.  Plan: Encourage ambulation, C&DB, PEP, pain control.      Atrial fibrillation     Blockage of coronary artery of heart (CMS HCC)     BPH associated with nocturia     CAD (coronary artery disease)     Celiac disease     Chest pain 05/07/2018    Chronic depression 05/23/2018    Chronic gout 01/02/2016    Was on allopurinol  but stopped due to CKD   Never had a flare up       CKD (chronic kidney disease)     COVID-19 08/03/2020    Diet-controlled diabetes mellitus (CMS HCC) 05/23/2018    DVT (deep venous thrombosis)     Gastroesophageal reflux disease without esophagitis 05/23/2018    Insomnia 05/23/2018    Leg  cramps, sleep related 05/23/2018    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  A/P  - Previously poorly controlled with fentanyl PCA, improved when switched to dilaudid; continue scheduled lidoderm  patches and tyenol, prn oxycodone; goal pain score <4.      Pericardial effusion     Pulmonary emboli     Sleep apnea    [4]   Past Surgical History:  Procedure Laterality Date    HX CHOLECYSTECTOMY      HX CORONARY ARTERY BYPASS GRAFT      HX HEART SURGERY  12/2015    HX HERNIA REPAIR      Inguinal     HX LIPOMA RESECTION      Chest    ORIF HIP FRACTURE Left 2024    OTHER SURGICAL HISTORY Left 01/2016    Evacuation of hematoma -[facility] Atrium  at [facility]     SKIN CANCER EXCISION  06/21/2013    melanoma/ear/[facility]    TRANSURETHRAL MICROWAVE THERAPY  05/24/2018    Dr Raechel   [5]   Social History  Tobacco Use    Smoking status: Never  Smokeless tobacco: Never   Vaping Use    Vaping status: Never Used   Substance Use Topics    Alcohol use: Not Currently     Comment: rare    Drug use: Never

## 2024-07-30 ENCOUNTER — Encounter (HOSPITAL_BASED_OUTPATIENT_CLINIC_OR_DEPARTMENT_OTHER): Payer: Self-pay

## 2024-08-02 ENCOUNTER — Other Ambulatory Visit (INDEPENDENT_AMBULATORY_CARE_PROVIDER_SITE_OTHER): Payer: Self-pay | Admitting: FAMILY MEDICINE

## 2024-08-02 DIAGNOSIS — F32A Depression, unspecified: Secondary | ICD-10-CM

## 2024-08-02 NOTE — Telephone Encounter (Signed)
 Last visit with PCP was 07/15/2024.    Last office visit in the department was 06/27/2024 .  Currently scheduled future appointment in the department is 10/02/2024.    [place], [place], 08/02/2024 , 08:22

## 2024-08-07 ENCOUNTER — Other Ambulatory Visit (INDEPENDENT_AMBULATORY_CARE_PROVIDER_SITE_OTHER): Payer: Self-pay | Admitting: FAMILY MEDICINE

## 2024-08-07 ENCOUNTER — Encounter (INDEPENDENT_AMBULATORY_CARE_PROVIDER_SITE_OTHER): Payer: Self-pay | Admitting: FAMILY MEDICINE

## 2024-08-07 DIAGNOSIS — E1122 Type 2 diabetes mellitus with diabetic chronic kidney disease: Secondary | ICD-10-CM

## 2024-08-07 NOTE — Telephone Encounter (Signed)
 Continue amlodipine  5 mg daily.   We can order insulin since we are running out of options.

## 2024-08-07 NOTE — Telephone Encounter (Addendum)
 Please advise recommendations.     [address] [place], [place]  08/07/2024, 15:49      Ozempic  and sugar results  (Newest Message First)             Dustin Webster to P [employer] [name] Clinical Support (supporting [name], MD) (Selected Message)        08/07/24  3:39 PM  On my last visit you prescribed ozempic  for high A1C and requested me to take blood pressure and sugar at home results.  My sugar results improved however I had to stop taking Ozempic  because it gave me severe diarrhhea for four to five days after each injection. Imodium was not effective at all in reducing it.   Since I stopped taking ozempic  my diarrhhea is gone but my sugar has increased.  When I started taking the ozempic  I made a large diet change per our discussion and have continued that change.    A [facility] nurse contacted me for yearly medicine review and said they would send you a fax with a suggested replacement for ozempic .  Please prescribe a replacement that may not have that side effect.    Sugar and blood pressure results  (Newest Message First)             Dustin Webster to [facility] [employer] [name] Clinical Support (supporting [name], MD) (Selected Message)        08/07/24  3:56 PM  Fasting Sugar results: 1st dose: 156, 174, 185, 149,154, 143, 147 (stopped melatonin), 129, 139, 2nd dose: 135,129,121, 130, 129,121,99,116 3rd dose 127,114, 129, 112, 132, 118, 4th dose,  127, 115, 121, 126,123,138, 151,166,131,146,164,124. Also please prescribe a sleep aid that does not have the side effect of driving up sugar.      Blood pressure after going to 5 mg Amlodipine : 130/72, 117/82, 109/79, 107/66, 110/73, 112/71, 105/67, 116/84, 108/73, 111/80, 132/85, 116/82, 115/77, 109/77, 109/81, 120/80, 110/76, 117/85, 116/88, 117/83, 124/88, 109/79, 115/81, 115/80, 126/72, 113/83, 117/79,105/76, 119/64.  AVG: 114/78. My cardiologist said consider 2.5 mg instead after 100/60 in her office. Please advise.

## 2024-08-07 NOTE — Telephone Encounter (Signed)
 Pt notified by [employer]. Awaiting Pt to review.     [address] [place], [place]  08/07/2024, 16:25

## 2024-08-08 ENCOUNTER — Other Ambulatory Visit (INDEPENDENT_AMBULATORY_CARE_PROVIDER_SITE_OTHER): Payer: Self-pay | Admitting: FAMILY MEDICINE

## 2024-08-08 DIAGNOSIS — E1122 Type 2 diabetes mellitus with diabetic chronic kidney disease: Secondary | ICD-10-CM

## 2024-08-08 MED ORDER — LANCETS 26 GAUGE
11 refills | Status: AC
Start: 2024-08-08 — End: 2024-10-02

## 2024-08-08 MED ORDER — BLOOD GLUCOSE CONTROL, NORMAL SOLUTION
11 refills | Status: DC
Start: 2024-08-08 — End: 2024-09-04

## 2024-08-08 MED ORDER — LANTUS SOLOSTAR U-100 INSULIN 100 UNIT/ML (3 ML) SUBCUTANEOUS PEN
10.0000 [IU] | PEN_INJECTOR | Freq: Every evening | SUBCUTANEOUS | 0 refills | Status: DC
Start: 2024-08-08 — End: 2024-08-09

## 2024-08-08 MED ORDER — BLOOD-GLUCOSE METER
0 refills | Status: DC
Start: 2024-08-08 — End: 2024-09-04

## 2024-08-08 MED ORDER — BLOOD SUGAR DIAGNOSTIC STRIPS
ORAL_STRIP | 0 refills | Status: DC
Start: 2024-08-08 — End: 2024-08-08

## 2024-08-08 NOTE — Telephone Encounter (Signed)
Order clarification from pharmacy.

## 2024-08-08 NOTE — Telephone Encounter (Signed)
 Insulin ordered. Please check BS daily TID before meals and bring readings to the next appt. Ordered diabetic supply to check BG

## 2024-08-08 NOTE — Telephone Encounter (Signed)
 Information sent via MyChart.

## 2024-08-08 NOTE — Telephone Encounter (Signed)
 Tanda BIRCH Durden to [facility] [employer] [name] (supporting [name], MD) (Selected Message)      08/07/24  4:36 PM  I am ok with insulin. Also please see my request for a sleep aid that does not raise sugar levels.

## 2024-08-09 ENCOUNTER — Other Ambulatory Visit: Payer: Self-pay

## 2024-08-09 ENCOUNTER — Ambulatory Visit: Admission: RE | Admit: 2024-08-09 | Discharge: 2024-08-09 | Disposition: A | Payer: Self-pay | Source: Ambulatory Visit

## 2024-08-09 ENCOUNTER — Ambulatory Visit (HOSPITAL_BASED_OUTPATIENT_CLINIC_OR_DEPARTMENT_OTHER): Admission: RE | Admit: 2024-08-09 | Discharge: 2024-08-09 | Disposition: A | Payer: Self-pay | Source: Ambulatory Visit

## 2024-08-09 ENCOUNTER — Ambulatory Visit (HOSPITAL_BASED_OUTPATIENT_CLINIC_OR_DEPARTMENT_OTHER)
Admission: RE | Admit: 2024-08-09 | Discharge: 2024-08-09 | Disposition: A | Payer: Self-pay | Source: Ambulatory Visit | Admitting: Radiology

## 2024-08-09 DIAGNOSIS — I2510 Atherosclerotic heart disease of native coronary artery without angina pectoris: Secondary | ICD-10-CM | POA: Insufficient documentation

## 2024-08-09 NOTE — Telephone Encounter (Signed)
 Request from pharmacy for formulary change from Lantus to Basaglar. Ok to switch?  Ginny Ben, CMA 08/09/24 08:05

## 2024-08-11 LAB — MYOCARDIAL PERFUSION COMPLETE
% MPHR: 90 %
Angina Index: 1
Baseline HR: 72 {beats}/min
Estimated workload: 10.1 METS
Exercise duration (min): 8 min
Exercise duration (sec): 2 s
Nuc Stress EF: 52 %
Peak Diastolic BP for Stress Tests: 45 mmHg
Peak Systolic BP Stress Test: 199 mmHg
Post peak HR: 136 {beats}/min
RPP: 27064
Target HR: 128 {beats}/min

## 2024-08-12 ENCOUNTER — Ambulatory Visit (HOSPITAL_BASED_OUTPATIENT_CLINIC_OR_DEPARTMENT_OTHER): Payer: Self-pay

## 2024-08-12 NOTE — Telephone Encounter (Signed)
 Stress test results sent to [facility].  Pers[facility] Proud, CNMT

## 2024-08-13 MED ORDER — MOUNJARO 2.5 MG/0.5 ML SUBCUTANEOUS PEN INJECTOR
2.5000 mg | PEN_INJECTOR | SUBCUTANEOUS | 0 refills | Status: DC
Start: 2024-08-13 — End: 2024-09-10

## 2024-08-13 NOTE — Addendum Note (Signed)
 Addended by: AUTHER GAUZE on: 08/13/2024 03:40 PM     Modules accepted: Orders

## 2024-08-13 NOTE — Telephone Encounter (Signed)
 Okay to order mounjaro 2.5 mg weekly.

## 2024-08-13 NOTE — Telephone Encounter (Signed)
 Pt notified of recommendations and voiced understanding. Pt states that he had his yearly review with his insurance company. Insurance suggested the Pt try Mounjaro. Pt wants to know if he can try this instead of the insulin.     [address] [place], [place]  08/13/2024, 13:54

## 2024-08-13 NOTE — Telephone Encounter (Signed)
 Pt notified by [employer]. Med list updated and Rx routed for approval.     Jeoffrey Heimlich, MA  08/13/2024, 15:38

## 2024-08-15 ENCOUNTER — Ambulatory Visit (HOSPITAL_BASED_OUTPATIENT_CLINIC_OR_DEPARTMENT_OTHER): Payer: Self-pay

## 2024-08-16 ENCOUNTER — Encounter (HOSPITAL_BASED_OUTPATIENT_CLINIC_OR_DEPARTMENT_OTHER): Payer: Self-pay

## 2024-08-16 DIAGNOSIS — I480 Paroxysmal atrial fibrillation: Secondary | ICD-10-CM

## 2024-08-16 LAB — 7 DAY EXTENDED HOLTER MONITOR
Enrollment Period End: 20250922134523
Enrollment Period Start: 20250915143659
Heart rate (average): 69 {beats}/min
Isolated SVE count: 417 episodes
Isolated VE Counts: 42335 episodes
Longest bigeminy - duration: 58.9 s
Longest supraventricular tachycardia episode - duration: 3.2 s
Longest supraventricular tachycardia episode - heart rate (: 160 {beats}/min
Longest supraventricular tachycardia episode - number of be: 8 beats
Longest trigeminy - duration: 35.9 s
SVE Couplets Counts: 14 episodes
SVE Triplets Counts: 3 episodes
Supraventricular tachycardia - heart rate (average): 166 {beats}/min
Supraventricular tachycardia - number of episodes: 2
Supraventricular tachycardia with fastest heart rate - dura: 1.3 s
Supraventricular tachycardia with fastest heart rate - hear: 172 {beats}/min
Supraventricular tachycardia with fastest heart rate - numb: 4 beats
VE Couplets Counts: 369 episodes
Ventricular tachycardia - heart rate (average): -1 {beats}/min

## 2024-08-16 NOTE — Nursing Note (Signed)
 08/16/2024      Patient Dustin Webster was ordered a Holter Monitor      Monitor results were routed to Dr Erskine for interpretation      Adolph Prima, MA 08/16/24 08:36

## 2024-08-17 ENCOUNTER — Other Ambulatory Visit (INDEPENDENT_AMBULATORY_CARE_PROVIDER_SITE_OTHER): Payer: Self-pay | Admitting: FAMILY MEDICINE

## 2024-08-17 DIAGNOSIS — E782 Mixed hyperlipidemia: Secondary | ICD-10-CM

## 2024-08-19 NOTE — Telephone Encounter (Signed)
 Last scheduled appointment with you was 06/27/2024.    Currently scheduled future appointment is 10/02/2024.    [address] [place], [place]  08/19/2024, 08:54

## 2024-08-20 ENCOUNTER — Encounter (HOSPITAL_BASED_OUTPATIENT_CLINIC_OR_DEPARTMENT_OTHER): Payer: Self-pay

## 2024-08-20 ENCOUNTER — Encounter (HOSPITAL_COMMUNITY): Payer: Self-pay | Admitting: Interventional Cardiology

## 2024-08-20 DIAGNOSIS — Z01812 Encounter for preprocedural laboratory examination: Secondary | ICD-10-CM

## 2024-08-20 DIAGNOSIS — R9439 Abnormal result of other cardiovascular function study: Secondary | ICD-10-CM | POA: Insufficient documentation

## 2024-08-20 DIAGNOSIS — R5383 Other fatigue: Secondary | ICD-10-CM | POA: Insufficient documentation

## 2024-08-20 DIAGNOSIS — R9431 Abnormal electrocardiogram [ECG] [EKG]: Secondary | ICD-10-CM

## 2024-08-20 DIAGNOSIS — I2510 Atherosclerotic heart disease of native coronary artery without angina pectoris: Secondary | ICD-10-CM | POA: Insufficient documentation

## 2024-08-20 NOTE — Progress Notes (Signed)
 Patient was recommended coronary angiogram by Dr. Hayes following a stress test which showed mild ischemia in the inferior wall.    Reviewed previous records from [place] [facility]  He had a coronary angiogram in June 2019 which showed:    Totally occluded native LAD, ramus, LCX and PDA  LIMA to LAD, SVG to PDA, SVG Y-graft to ramus and OM2 are widely patent    He had normal EF and was recommended medical therapy.      Given his new symptoms and stress findings, he was recommended by Dr. Hayes to undergo coronary angiogram and will schedule him for it, after carefully assessing risks and benefits and wants to proceed with it.    Schuyler Hammers MD [facility]  08/20/2024, 10:44

## 2024-08-21 ENCOUNTER — Encounter (INDEPENDENT_AMBULATORY_CARE_PROVIDER_SITE_OTHER): Payer: Self-pay | Admitting: FAMILY MEDICINE

## 2024-08-21 ENCOUNTER — Other Ambulatory Visit (INDEPENDENT_AMBULATORY_CARE_PROVIDER_SITE_OTHER): Payer: Self-pay | Admitting: FAMILY MEDICINE

## 2024-08-21 DIAGNOSIS — I480 Paroxysmal atrial fibrillation: Secondary | ICD-10-CM

## 2024-08-21 DIAGNOSIS — E1122 Type 2 diabetes mellitus with diabetic chronic kidney disease: Secondary | ICD-10-CM

## 2024-08-21 MED ORDER — DAPAGLIFLOZIN PROPANEDIOL 10 MG TABLET
10.0000 mg | ORAL_TABLET | Freq: Every day | ORAL | 1 refills | Status: AC
Start: 2024-08-21 — End: ?

## 2024-08-21 MED ORDER — APIXABAN 5 MG TABLET
5.0000 mg | ORAL_TABLET | Freq: Two times a day (BID) | ORAL | 0 refills | Status: DC
Start: 2024-08-21 — End: 2024-08-26

## 2024-08-21 NOTE — Telephone Encounter (Signed)
 Last scheduled appointment with you was 06/27/2024.    Currently scheduled future appointment is 10/02/2024    Jeoffrey Heimlich, MA  08/21/2024, 14:07

## 2024-08-21 NOTE — Telephone Encounter (Signed)
 Last scheduled appointment with you was 06/27/2024.    Currently scheduled future appointment is 10/02/2024.    [address] [place], [place]  08/21/2024, 14:09      Farxiga   (Newest Message First)             Dustin Webster to P [employer] [name] Clinical Support (supporting [name], MD) (Selected Message)        08/21/24 11:58 AM  I am getting low on Farxiga  with no refills left. The system would not allow me to request one.  It did allow me to request Eliquis .  Please call in 90 prescriptions for both.  THANKS

## 2024-08-23 ENCOUNTER — Ambulatory Visit: Attending: Interventional Cardiology

## 2024-08-23 ENCOUNTER — Ambulatory Visit (HOSPITAL_BASED_OUTPATIENT_CLINIC_OR_DEPARTMENT_OTHER): Payer: Self-pay

## 2024-08-23 ENCOUNTER — Ambulatory Visit
Admission: RE | Admit: 2024-08-23 | Discharge: 2024-08-23 | Disposition: A | Source: Ambulatory Visit | Attending: Interventional Cardiology | Admitting: Radiology

## 2024-08-23 ENCOUNTER — Ambulatory Visit (HOSPITAL_BASED_OUTPATIENT_CLINIC_OR_DEPARTMENT_OTHER)

## 2024-08-23 ENCOUNTER — Other Ambulatory Visit: Payer: Self-pay

## 2024-08-23 DIAGNOSIS — R9439 Abnormal result of other cardiovascular function study: Secondary | ICD-10-CM | POA: Insufficient documentation

## 2024-08-23 DIAGNOSIS — I2510 Atherosclerotic heart disease of native coronary artery without angina pectoris: Secondary | ICD-10-CM

## 2024-08-23 DIAGNOSIS — Z01812 Encounter for preprocedural laboratory examination: Secondary | ICD-10-CM

## 2024-08-23 DIAGNOSIS — R5383 Other fatigue: Secondary | ICD-10-CM

## 2024-08-23 DIAGNOSIS — R9431 Abnormal electrocardiogram [ECG] [EKG]: Secondary | ICD-10-CM | POA: Insufficient documentation

## 2024-08-23 DIAGNOSIS — R079 Chest pain, unspecified: Secondary | ICD-10-CM

## 2024-08-23 LAB — CBC WITH DIFF
BASOPHIL #: <0.1 x10ˆ3/uL (ref <=0.20)
BASOPHIL %: 0.7 %
EOSINOPHIL #: 0.21 x10ˆ3/uL (ref <=0.50)
EOSINOPHIL %: 4.6 %
HCT: 49.3 % (ref 38.9–52.0)
HGB: 15.5 g/dL (ref 13.4–17.5)
IMMATURE GRANULOCYTE #: <0.1 x10ˆ3/uL (ref <0.10)
IMMATURE GRANULOCYTE %: 0.4 % (ref 0.0–1.0)
LYMPHOCYTE #: 1.61 x10ˆ3/uL (ref 1.00–4.80)
LYMPHOCYTE %: 35.2 %
MCH: 28.3 pg (ref 26.0–32.0)
MCHC: 31.4 g/dL (ref 31.0–35.5)
MCV: 90 fL (ref 78.0–100.0)
MONOCYTE #: 0.44 x10ˆ3/uL (ref 0.20–1.10)
MONOCYTE %: 9.6 %
MPV: 12.1 fL (ref 8.7–12.5)
NEUTROPHIL #: 2.27 x10ˆ3/uL (ref 1.50–7.70)
NEUTROPHIL %: 49.5 %
PLATELETS: 192 x10ˆ3/uL (ref 150–400)
RBC: 5.48 x10ˆ6/uL (ref 4.50–6.10)
RDW-CV: 14 % (ref 11.5–15.5)
WBC: 4.6 x10ˆ3/uL (ref 3.7–11.0)

## 2024-08-23 LAB — ECG 12 LEAD
Atrial Rate: 62 {beats}/min
Calculated P Axis: 43 degrees
Calculated R Axis: 21 degrees
Calculated T Axis: 101 degrees
PR Interval: 202 ms
QRS Duration: 88 ms
QT Interval: 404 ms
QTC Calculation: 410 ms
Ventricular rate: 62 {beats}/min

## 2024-08-23 LAB — URINALYSIS, MACROSCOPIC
BILIRUBIN: NOT DETECTED mg/dL
BLOOD: NOT DETECTED mg/dL
KETONES: NOT DETECTED mg/dL
LEUKOCYTES: NOT DETECTED WBCs/uL
NITRITE: NOT DETECTED
PH: 5.5 (ref 5.0–?)
PROTEIN: NOT DETECTED mg/dL
SPECIFIC GRAVITY: 1.014 (ref 1.005–?)
UROBILINOGEN: NOT DETECTED mg/dL

## 2024-08-23 LAB — PT/INR
INR: 1.29 (ref <=5.00)
PROTHROMBIN TIME: 14.7 s — ABNORMAL HIGH (ref 9.4–12.5)

## 2024-08-23 LAB — BASIC METABOLIC PANEL
ANION GAP: 8 mmol/L (ref 4–13)
BUN/CREA RATIO: 17 (ref 6–22)
BUN: 28 mg/dL — ABNORMAL HIGH (ref 8–25)
CALCIUM: 9.7 mg/dL (ref 8.6–10.3)
CHLORIDE: 107 mmol/L (ref 96–111)
CO2 TOTAL: 24 mmol/L (ref 23–31)
CREATININE: 1.67 mg/dL — ABNORMAL HIGH (ref 0.75–1.35)
GLUCOSE: 127 mg/dL — ABNORMAL HIGH (ref 65–125)
POTASSIUM: 4.5 mmol/L (ref 3.5–5.1)
SODIUM: 139 mmol/L (ref 136–145)
eGFRcr - MALE: 44 mL/min/1.73mˆ2 — ABNORMAL LOW (ref >=60)

## 2024-08-23 LAB — MAGNESIUM: MAGNESIUM: 2 mg/dL (ref 1.8–2.6)

## 2024-08-25 ENCOUNTER — Other Ambulatory Visit (INDEPENDENT_AMBULATORY_CARE_PROVIDER_SITE_OTHER): Payer: Self-pay | Admitting: FAMILY MEDICINE

## 2024-08-25 DIAGNOSIS — I480 Paroxysmal atrial fibrillation: Secondary | ICD-10-CM

## 2024-08-26 ENCOUNTER — Other Ambulatory Visit (INDEPENDENT_AMBULATORY_CARE_PROVIDER_SITE_OTHER): Payer: Self-pay | Admitting: FAMILY MEDICINE

## 2024-08-26 DIAGNOSIS — I480 Paroxysmal atrial fibrillation: Secondary | ICD-10-CM

## 2024-08-26 MED ORDER — APIXABAN 5 MG TABLET
5.0000 mg | ORAL_TABLET | Freq: Two times a day (BID) | ORAL | 0 refills | Status: AC
Start: 2024-08-26 — End: ?

## 2024-08-26 NOTE — Telephone Encounter (Signed)
 Pharmacy updated and routed for approval.     Dustin Chiang [place], MA  08/26/2024, 14:14        Dustin Webster to P [employer] Dustin Webster Clinical Support (supporting Dustin Sauer, MD)        08/26/24  1:10 PM  Dustin Webster, There is a problem with Eliquis .  The system shows I am getting that medication in [facility]  I received a message the prescription had been placed there and received a message it had been filled.. When I was in [place] earlier last summer I had contacted you guys for a one time prescription there since I was visiting there for a month and ran out.    I contacted the [place] [facility] and cancelled that Prescription.    Please send  one in to [facility] [address] [facility] [place].    It is a long drive to [place]. Please make sure the system has the [address] [place] [facility] as my Pharmacy for Eliquis . It has the correct pharmacy for all my other prescriptions.   THANKS.

## 2024-08-26 NOTE — Telephone Encounter (Signed)
 Duplicate request

## 2024-08-29 ENCOUNTER — Other Ambulatory Visit: Payer: Self-pay

## 2024-08-29 ENCOUNTER — Encounter (HOSPITAL_COMMUNITY): Payer: Self-pay

## 2024-08-29 ENCOUNTER — Ambulatory Visit
Admission: RE | Admit: 2024-08-29 | Discharge: 2024-08-29 | Disposition: A | Discharge status: Home / Self Care | Source: Ambulatory Visit | Attending: Interventional Cardiology

## 2024-08-29 HISTORY — DX: Personal history of other specified conditions: Z87.898

## 2024-08-29 HISTORY — DX: Type 2 diabetes mellitus without complications: E11.9

## 2024-08-29 HISTORY — DX: Essential (primary) hypertension: I10

## 2024-08-29 HISTORY — DX: Presence of spectacles and contact lenses: Z97.3

## 2024-08-29 NOTE — Nurses Notes (Signed)
 Notified Devere at Dr. Harolyn pt was instructed to hold Eliquis  and Farxiga  prior to procedure 09/04/24. Pt took Mounjaro today. Devere states Dr. Erskine will be ok with 6 days and can proceed

## 2024-09-04 ENCOUNTER — Encounter (HOSPITAL_COMMUNITY)
Admission: RE | Disposition: A | Discharge status: Home / Self Care | Payer: Self-pay | Source: Ambulatory Visit | Attending: Interventional Cardiology

## 2024-09-04 ENCOUNTER — Ambulatory Visit
Admission: RE | Admit: 2024-09-04 | Discharge: 2024-09-04 | Disposition: A | Discharge status: Home / Self Care | Payer: Self-pay | Source: Ambulatory Visit | Attending: Interventional Cardiology | Admitting: Interventional Cardiology

## 2024-09-04 DIAGNOSIS — R9439 Abnormal result of other cardiovascular function study: Secondary | ICD-10-CM | POA: Insufficient documentation

## 2024-09-04 DIAGNOSIS — Z951 Presence of aortocoronary bypass graft: Secondary | ICD-10-CM | POA: Insufficient documentation

## 2024-09-04 DIAGNOSIS — Z86718 Personal history of other venous thrombosis and embolism: Secondary | ICD-10-CM | POA: Insufficient documentation

## 2024-09-04 DIAGNOSIS — I2510 Atherosclerotic heart disease of native coronary artery without angina pectoris: Secondary | ICD-10-CM | POA: Insufficient documentation

## 2024-09-04 DIAGNOSIS — I10 Essential (primary) hypertension: Secondary | ICD-10-CM | POA: Insufficient documentation

## 2024-09-04 DIAGNOSIS — E785 Hyperlipidemia, unspecified: Secondary | ICD-10-CM | POA: Insufficient documentation

## 2024-09-04 DIAGNOSIS — R5383 Other fatigue: Secondary | ICD-10-CM | POA: Insufficient documentation

## 2024-09-04 DIAGNOSIS — I480 Paroxysmal atrial fibrillation: Secondary | ICD-10-CM | POA: Insufficient documentation

## 2024-09-04 DIAGNOSIS — E119 Type 2 diabetes mellitus without complications: Secondary | ICD-10-CM | POA: Insufficient documentation

## 2024-09-04 DIAGNOSIS — I2582 Chronic total occlusion of coronary artery: Secondary | ICD-10-CM | POA: Insufficient documentation

## 2024-09-04 SURGERY — CORS & LHC W/BYPASS GRAFTS W/WO LVG
Anesthesia: None (Nurse-Monitored)

## 2024-09-04 MED ORDER — BLOOD GLUCOSE CONTROL, NORMAL SOLUTION
11 refills | Status: AC
Start: 2024-09-04 — End: 2024-10-02

## 2024-09-04 MED ORDER — HEPARIN (PORCINE) 5,000 UNIT/ML INJECTION SOLUTION
INTRAMUSCULAR | Status: AC
Start: 2024-09-04 — End: 2024-09-04
  Filled 2024-09-04: qty 1

## 2024-09-04 MED ORDER — VERAPAMIL 2.5 MG/ML INTRAVENOUS SOLUTION
INTRAVENOUS | Status: AC
Start: 2024-09-04 — End: 2024-09-04
  Filled 2024-09-04: qty 2

## 2024-09-04 MED ORDER — LIDOCAINE (PF) 10 MG/ML (1 %) INJECTION SOLUTION
INTRAMUSCULAR | Status: AC
Start: 2024-09-04 — End: 2024-09-04
  Filled 2024-09-04: qty 30

## 2024-09-04 MED ORDER — HEPARIN (PORCINE) (PF) 2,000 UNIT/1,000 ML IN 0.9 % SODIUM CHLORIDE IV
INTRAVENOUS | Status: AC
Start: 2024-09-04 — End: 2024-09-04
  Filled 2024-09-04: qty 1000

## 2024-09-04 MED ORDER — SODIUM CHLORIDE 0.9 % INTRAVENOUS SOLUTION
INTRAVENOUS | Status: DC
Start: 2024-09-04 — End: 2024-09-04

## 2024-09-04 MED ORDER — LIDOCAINE (PF) 10 MG/ML (1 %) INJECTION SOLUTION
Freq: Once | INTRAMUSCULAR | Status: DC | PRN
Start: 2024-09-04 — End: 2024-09-04
  Administered 2024-09-04: 2 mL via INTRADERMAL

## 2024-09-04 MED ORDER — IOPAMIDOL 370 MG IODINE/ML (76 %) INTRAVENOUS SOLUTION
Freq: Once | INTRAVENOUS | Status: DC | PRN
Start: 2024-09-04 — End: 2024-09-04
  Administered 2024-09-04: 106 mL via INTRA_ARTERIAL

## 2024-09-04 MED ORDER — MIDAZOLAM 1 MG/ML INJECTION WRAPPER
INTRAMUSCULAR | Status: AC
Start: 2024-09-04 — End: 2024-09-04
  Filled 2024-09-04: qty 2

## 2024-09-04 MED ORDER — MIDAZOLAM 1 MG/ML INJECTION SOLUTION
Freq: Once | INTRAMUSCULAR | Status: DC | PRN
Start: 2024-09-04 — End: 2024-09-04
  Administered 2024-09-04: 2 mg via INTRAVENOUS

## 2024-09-04 MED ORDER — SODIUM CHLORIDE 0.9 % (FLUSH) INJECTION SYRINGE
3.0000 mL | INJECTION | Freq: Three times a day (TID) | INTRAMUSCULAR | Status: DC
Start: 2024-09-04 — End: 2024-09-04

## 2024-09-04 MED ORDER — DEXTROSE 5% IN WATER (D5W) FLUSH BAG - 250 ML
INTRAVENOUS | Status: DC | PRN
Start: 2024-09-04 — End: 2024-09-04

## 2024-09-04 MED ORDER — BLOOD-GLUCOSE METER
0 refills | Status: AC
Start: 2024-09-04 — End: 2024-09-05

## 2024-09-04 MED ORDER — HEPARIN (PORCINE) IN NS (PF) 2 UNIT/ML IV
INTRAVENOUS | Status: AC | PRN
Start: 2024-09-04 — End: 2024-09-04
  Administered 2024-09-04: 3 mL/h via INTRA_ARTERIAL

## 2024-09-04 MED ORDER — SODIUM CHLORIDE 0.9% FLUSH BAG - 250 ML
INTRAVENOUS | Status: DC | PRN
Start: 2024-09-04 — End: 2024-09-04

## 2024-09-04 MED ORDER — HEPARIN (PORCINE) (PF) 2,000 UNIT/1,000 ML IN 0.9 % SODIUM CHLORIDE IV
Freq: Once | INTRAVENOUS | Status: DC | PRN
Start: 2024-09-04 — End: 2024-09-04
  Administered 2024-09-04: 2000 [IU]

## 2024-09-04 MED ORDER — SODIUM CHLORIDE 0.9 % (FLUSH) INJECTION SYRINGE
3.0000 mL | INJECTION | INTRAMUSCULAR | Status: DC | PRN
Start: 2024-09-04 — End: 2024-09-04

## 2024-09-04 MED ORDER — HEPARIN (PORCINE) (PF) 2,000 UNIT/1,000 ML IN 0.9 % SODIUM CHLORIDE IV
INTRAVENOUS | Status: AC
Start: 2024-09-04 — End: 2024-09-04
  Filled 2024-09-04: qty 2000

## 2024-09-04 SURGICAL SUPPLY — 12 items
CATH ANGIO 6FR FR4 CURVE 100CM IMPULSE 2 WRE BRD STF PROX SHAFT SFT DIST SEG VENTRIC (CATHETERS) ×2 IMPLANT
DEVICE CLSR ANGIOSEAL VIP 6FR 70CM HMST BIOABS INSERTION SHEATH GW CO-PLMR CLGN VAS STRL LF  DISP (IMPLANTS CARDIOTHORACIC) ×2 IMPLANT
DISCONTINUED - CATH ANGIO 6FR FL4 CURVE 100CM EXPO WRE BRD RBST SHAFT FEM TRILON (CATHETERS) ×2 IMPLANT
DISCONTINUED - CATH ANGIO 6FR RCB CURVE 100CM EXPO FULL LGTH WRE BRD RBST SHAFT COR TRILON (CATHETERS) ×2 IMPLANT
GW .035IN 3CM 260CM FIX COR PTFE VAS J (WIRE) ×2 IMPLANT
KIT SNGL FLUSH LINE_K0801987 15EA/BX (CATHETERS) ×2 IMPLANT
KIT SURG LFT HRT STRL DISP LF (VASCULAR) ×2 IMPLANT
NEEDLE 7CM 18GA WO BASEPLATE P_ROC PERC DISP TW VAS ACCESS (MED SURG SUPPLIES) ×2 IMPLANT
PACK SURG CSTM CTH LB LF (CUSTOM TRAYS & PACK) ×2 IMPLANT
SHEATH INTROD 10CM 6FR PINN COR SS HDRPH PTFE SNAPON DIL LOCK KINK RST SMOOTH TRNS 2.5CM ACPT .038IN (INTRODUCER) ×2 IMPLANT
SHIELD RAD 17X12IN X-DRP .25MM ABS XRY CTOUT LEADFREE OVRWRAP 35X35IN STRL DISP (MED SURG SUPPLIES) ×2 IMPLANT
STOPCOCK ANGIO 400 PSI NAMIC 3W ROT ADPR LFT PORT STRL LF (IV TUBING & ACCESSORIES) ×2 IMPLANT

## 2024-09-04 NOTE — Nurses Notes (Signed)
 Patient arrived to holding area, connected to bedside monitor. Right groin site, no signs of bleeding or hematoma. Family at bedside, head of bed remains flat. Patient understand activity restrictions. IV fluids infusing.

## 2024-09-04 NOTE — H&P (Signed)
 History and Physical Note  [place]  Name: Dustin Webster                       Date of Birth: [birth year]   MRN:  [id number]                         Date of visit: 08/20/2024     PCP: [name], MD     CHIEF COMPLAINT:  No chief complaint on file.      HOPI:  Dustin Webster is a 68 y.o. year old male wwith cardiac history significant for CAD status post CABG, hypertension, hyperlipidemia, paroxysmal atrial fibrillation, deep vein thrombosis/PE and type 2 diabetes.       The patient reports he has been having increased fatigue over the past couple of months which is similar to his prior CABG.     Patient was recommended coronary angiogram by Dr. Hayes   The SPECT exercise Cardiolite rest Cardiolite scans are abnormal. There is moderate sized area involving the inferior wall that is consistent with mild inferior wall ischemia.        Reviewed previous records from [place] [facility]  He had a coronary angiogram in June 2019 which showed:     Totally occluded native LAD, ramus, LCX and PDA  LIMA to LAD, SVG to PDA, SVG Y-graft to ramus and OM2 are widely patent     He had normal EF and was recommended medical therapy.        Given his new symptoms and stress findings, he was recommended by Dr. Hayes to undergo coronary angiogram and will schedule him for it, after carefully assessing risks and benefits and wants to proceed with it.    Past Medical History:   Diagnosis Date    Anemia     Atelectasis 12/31/2015    Formatting of this note might be different from the original.  History: post-op  Assessment: bibasilar atelectasis on CXR L>R.  Plan: Encourage ambulation, C&DB, PEP, pain control.      Atrial fibrillation     Blockage of coronary artery of heart (CMS HCC)     BPH associated with nocturia     CAD (coronary artery disease)     Celiac disease     Chest pain 05/07/2018    Chronic depression 05/23/2018    Chronic gout 01/02/2016    Was on allopurinol  but stopped due to CKD   Never had  a flare up       CKD (chronic kidney disease)     COVID-19 08/03/2020    Diet-controlled diabetes mellitus (CMS HCC) 05/23/2018    DVT (deep venous thrombosis)     Left    Essential hypertension     Gastroesophageal reflux disease without esophagitis 05/23/2018    History of anesthesia complications     Hypotension    Hyperlipidemia     Insomnia 05/23/2018    Leg cramps, sleep related 05/23/2018    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  A/P  - Previously poorly controlled with fentanyl PCA, improved when switched to dilaudid; continue scheduled lidoderm  patches and tyenol, prn oxycodone; goal pain score <4.      Pericardial effusion     Pulmonary emboli     Sleep apnea  Type 2 diabetes mellitus     Wears glasses            Past Surgical History:   Procedure Laterality Date    HX CHOLECYSTECTOMY      HX CORONARY ARTERY BYPASS GRAFT      HX HEART CATHETERIZATION      X'S 2    HX HEART SURGERY  12/2015    HX HERNIA REPAIR      Inguinal     HX LIPOMA RESECTION      Chest    HX OTHER      fx right arm- hardware removed    ORIF HIP FRACTURE Left 2024    OTHER SURGICAL HISTORY Left 01/2016    Evacuation of hematoma -[facility] Atrium  at [facility]     SKIN CANCER EXCISION  06/21/2013    melanoma/ear/[facility]    TRANSURETHRAL MICROWAVE THERAPY  05/24/2018    Dr Raechel           Current Facility-Administered Medications   Medication Dose Route Frequency    NS premix infusion   Intravenous Continuous       Allergies[1]    Family Medical History:       Problem Relation (Age of Onset)    Emphysema Father    Heart Attack General Family Hx    Heart Disease Mother, Brother    Hypertension (High Blood Pressure) Mother    No Known Problems Other, Sister, Son, Son    Stroke Mother, Brother              Social History     Tobacco Use    Smoking status: Never    Smokeless tobacco: Never   Substance Use Topics    Alcohol use: Not  Currently        REVIEW OF SYSTEMS:  Constitutional: No fever.   Respiratory: No shortness of breath  Cardiovascular: No chest pain palpitations, lower extremity edema.    All other systems reviewed and are negative except as noted in HOPI.    PHYSICAL EXAMINATION:     Vitals reviewed. There were no vitals taken for this visit.      Vitals Filed    No data found in the last 1 encounters.         Constitutional: He is oriented to person, place, and time.   Cardiovascular: Normal rate and rhythm without murmurs.  No JVD.  No peripheral edema is noted.  Pulmonary/Chest: Lungs clear.    Abdominal: Soft. Nontender, non distended,no organomegaly.      LABS:     No results found for this or any previous visit (from the past 24 hours).  Labs:     Lab Results   Component Value Date    HA1C 8.5 (H) 06/24/2024    LDLCHOL 1 06/24/2024    TRIG 218 (H) 06/24/2024    HDLCHOL 33 (L) 06/24/2024    CHOLESTEROL 78 06/24/2024      Lab Results   Component Value Date    SODIUM 139 08/23/2024    POTASSIUM 4.5 08/23/2024    CHLORIDE 107 08/23/2024    CO2 24 08/23/2024    BUN 28 (H) 08/23/2024    CREATININE 1.67 (H) 08/23/2024    GFR 44 (L) 08/23/2024    CALCIUM  9.7 08/23/2024         IMAGING:  No orders to display         ASSESSMENT:  ENCOUNTER DIAGNOSES     ICD-10-CM   1.  CAD (coronary artery disease)  I25.10   2. Abnormal stress test  R94.39   3. Fatigue  R53.83         PLAN:  I explained to the patient and or family the indications, benefits, risks, alternatives of coronary angiogram, LV angiography and possible percutaneous coronary intervention .  We specifically discussed risk of death, MI, allergic reactions, stroke, bleeding, infection, dissection, perforation , contrast nephropathy, respiratory failure and vascular complication.  We also discussed about the need for emergency coronary artery bypass surgery should he have any complications.  Patient and family verbalized understanding and consented to the above procedure.  All  questions were answered.     Electronically signed by       Schuyler Hammers, MD  09/04/2024, 08:11    This note may have been partially generated using MModal Fluency Direct system, and there may be some incorrect words, spellings, and punctuation that were not noted in checking the note before saving, though effort was made to avoid such errors.         [1]   Allergies  Allergen Reactions    Gluten      Celiac disease    Statin [Atorvastatin] Myalgia     All statin medications

## 2024-09-04 NOTE — Nurses Notes (Signed)
HOB elevated at this time, right groin shows no signs of bleeding or hematoma. Provided patient with food and drink. Family remains at bedside. Call light within reach, will continue to monitor.

## 2024-09-04 NOTE — Brief Op Note (Signed)
 Coronary angiogram through right common femoral approach showed:    LVEDP 11 mmHg  Patent SVG to PDA  Widely patent Y-graft SVG to ramus and OM1  Widely patent LIMA to LAD  RCA is known to be occluded not visualized  Left main has mild disease, trifurcates into LAD ramus and LCX which are distally occluded    Plan:    Medical therapy  Continue risk factor modification for CAD      Schuyler Hammers MD [facility]  09/04/2024, 11:51

## 2024-09-04 NOTE — Nurses Notes (Signed)
Discharge instructions given to patient, patient verbalized understanding. All questions answered. Site assessed, no bleeding or hematoma noted. IV removed, catheter intact. Patient to discharge home with family.   Miral Hoopes Anderson, RN

## 2024-09-04 NOTE — Sedation Documentation (Signed)
 09/04/24  Procedure(s):  CORS & LHC W/BYPASS GRAFTS W/WO LVG  CARD US  GUIDED ACCESS    Diagnosis:     Sedation Informed Consent, pre-sedation risk assessment and evaluation completed.  History of previous adverse experiences with sedation/analgesia/anesthesia assessed.  Monitored conscious sedation was administered under my direct supervision by an appropriately trained sedation nurse.  Appropriate Facility and Equipment compliant.      Procedure time out  Timeouts       Bauserman, Abby, RTR at [facility] Sep 04, 2024 1127 EDT       Timeout Details       Timeout type: Preprocedure              Procedures       Panel 1: CORS & LHC W/BYPASS GRAFTS W/WO LVG with Erskine Churn, MD              Timeout Questions    Correct patient? Yes  Correct site? Yes  Correct side? Yes  Correct position? Yes  Correct procedure? Yes  Site marked? Yes  H&P note completed? Yes  Consents verified? Yes  Radiology studies available? Yes  Relevant lab results available? Yes  Allergies reviewed? Yes  Is there risk of high blood loss? Yes  Are all required blood products & devices for the procedure available? Yes  Is documentation verified? Yes             Staff Present       Physicians  Erskine Churn, MD Staff  Alamo, Abby, RTR  Abelina Birmingham, RN  Proctor, Kaitlin, RN  Jenel Lacks, [place]              Signing History       Staff Performed Signed    Deneice Pleasant, RTR Wed Sep 04, 2024 1127 EDT Wed Sep 04, 2024 1127 EDT                            Physician in  and out times  Physicians       Name Panel Role Time Period    Erskine Churn, MD Panel 1 Primary 09/04/2024 1124 - 09/04/2024 1148  09/04/2024 1123 - 09/04/2024 1123          Sedation Staff       Name Type Time Period    Abelina Birmingham, RN Invasive Nurse 09/04/2024 1111    Proctor, Kaitlin, RN Invasive Nurse 09/04/2024 1111            Sedation and Procedure Times:  Event Time In   CV Sedation Start 1125          Proc Name Event Type Event Time    CORS & LHC W/BYPASS GRAFTS W/WO LVG   Incision Start Wed Sep 04, 2024 11:24 AM    CORS & LHC W/BYPASS GRAFTS W/WO LVG  Incision Close Wed Sep 04, 2024 11:48 AM    CARD US  GUIDED ACCESS  Incision Start     CARD US  GUIDED ACCESS  Incision Close             Aldrete Scores    Pre Sedation  Activity: 2-->able to move 4 extremities voluntarily or on command  Respiration: 2-->able to breathe and cough freely  Circulation: 2-->BP within 20% of pre-anesthetic level  Consciousness: 2-->fully awake  O2 Saturation: 2-->able to maintain O2 saturation greater than 92% on room air  Dressing: 2-->dry and clean or not applicable  Pain: 2-->pain free  Ambulation: 2-->able to stand up and walk straight, on ordered bedrest, or performing at previous level of functioning  Fasting/Feeding: 2-->able to drink fluids or NPO, minimal nausea/ no vomiting  Urine Output: 2-->has voided, adequate urine output per device, or not applicable  Modified Aldrete Score: 20      Post Sedation                                                Hospital[facility]  Unit: [facility] Invasive Lab  IV Type: Peripheral IV  Additional Intervention needed:: No     Effects of Administration: Successful sedation w/o adverse events       Patient was continuously monitored throughout the procedure.  Provider was in attendance throughout sedation.  See Invasive Procedure Log for additional details.    Schuyler Hammers, MD

## 2024-09-06 ENCOUNTER — Ambulatory Visit (HOSPITAL_BASED_OUTPATIENT_CLINIC_OR_DEPARTMENT_OTHER): Payer: Self-pay

## 2024-09-06 NOTE — Telephone Encounter (Signed)
 Repatha  is approved, scanned under media, valid 11/15/23-09/06/25. Tonya Axe, LPN

## 2024-09-10 ENCOUNTER — Other Ambulatory Visit (INDEPENDENT_AMBULATORY_CARE_PROVIDER_SITE_OTHER): Payer: Self-pay | Admitting: FAMILY MEDICINE

## 2024-09-10 DIAGNOSIS — N1831 Type 2 diabetes mellitus with diabetic chronic kidney disease: Secondary | ICD-10-CM

## 2024-09-10 MED ORDER — MOUNJARO 2.5 MG/0.5 ML SUBCUTANEOUS PEN INJECTOR
2.5000 mg | PEN_INJECTOR | SUBCUTANEOUS | 0 refills | Status: DC
Start: 2024-09-10 — End: 2024-10-08

## 2024-09-10 NOTE — Telephone Encounter (Signed)
 Last scheduled appointment with you was 06/27/2024.    Currently scheduled future appointment is 10/02/2024.    [address] [place], [place]  09/10/2024, 12:58

## 2024-09-17 ENCOUNTER — Other Ambulatory Visit (INDEPENDENT_AMBULATORY_CARE_PROVIDER_SITE_OTHER): Payer: Self-pay | Admitting: FAMILY MEDICINE

## 2024-09-17 DIAGNOSIS — N1831 Chronic kidney disease, stage 3a: Secondary | ICD-10-CM

## 2024-09-17 NOTE — Telephone Encounter (Signed)
 Per Pharmacy request, send One touch per Welford Ginny Ben, CMA 09/17/24 15:40

## 2024-09-17 NOTE — Telephone Encounter (Signed)
 Last scheduled appointment with you was 06/27/2024, Currently scheduled future appointment is 10/02/2024,       Sherwood Castilla, MA  09/17/2024, 11:20

## 2024-09-18 NOTE — Telephone Encounter (Signed)
 duplicate

## 2024-09-24 ENCOUNTER — Encounter (INDEPENDENT_AMBULATORY_CARE_PROVIDER_SITE_OTHER): Payer: Self-pay | Admitting: FAMILY MEDICINE

## 2024-09-24 ENCOUNTER — Telehealth (INDEPENDENT_AMBULATORY_CARE_PROVIDER_SITE_OTHER): Payer: Self-pay | Admitting: FAMILY MEDICINE

## 2024-09-24 DIAGNOSIS — N1831 Chronic kidney disease, stage 3a: Secondary | ICD-10-CM

## 2024-09-24 NOTE — Telephone Encounter (Signed)
 Orders placed    Lucie Ford, RN  09/24/2024, 15:56

## 2024-09-24 NOTE — Telephone Encounter (Signed)
Okay to order A1C? 

## 2024-09-24 NOTE — Telephone Encounter (Signed)
 Please advise    Lucie Ford, RN  09/24/2024, 14:57

## 2024-09-25 ENCOUNTER — Other Ambulatory Visit: Payer: Self-pay

## 2024-09-25 ENCOUNTER — Ambulatory Visit: Payer: Self-pay

## 2024-09-25 ENCOUNTER — Encounter (HOSPITAL_BASED_OUTPATIENT_CLINIC_OR_DEPARTMENT_OTHER): Payer: Self-pay

## 2024-09-25 VITALS — BP 120/82 | HR 72 | Ht 75.25 in | Wt 222.2 lb

## 2024-09-25 DIAGNOSIS — I10 Essential (primary) hypertension: Secondary | ICD-10-CM | POA: Insufficient documentation

## 2024-09-25 DIAGNOSIS — E782 Mixed hyperlipidemia: Secondary | ICD-10-CM | POA: Insufficient documentation

## 2024-09-25 DIAGNOSIS — Z86718 Personal history of other venous thrombosis and embolism: Secondary | ICD-10-CM | POA: Insufficient documentation

## 2024-09-25 DIAGNOSIS — I480 Paroxysmal atrial fibrillation: Secondary | ICD-10-CM | POA: Insufficient documentation

## 2024-09-25 DIAGNOSIS — I2510 Atherosclerotic heart disease of native coronary artery without angina pectoris: Secondary | ICD-10-CM | POA: Insufficient documentation

## 2024-09-25 NOTE — Progress Notes (Signed)
 [facility]  [address]  [place] [address]  [phone]    Date:   09/25/2024  Name: Dustin Webster  Age: 68 y.o.    REASON FOR VISIT:   Hospital Follow Up     HISTORY OF PRESENTING ILLNESS:    Dustin Webster is a 68 y.o. male who presents today for a follow-up appointment with cardiac history significant for CAD status post CABG, hypertension, hyperlipidemia, paroxysmal atrial fibrillation, deep vein thrombosis/PE and type 2 diabetes.  The patient has last seen in the office 07/29/2024.    Patient underwent invasive coronary angiogram on 09/04/2024 showed severe three-vessel CAD with a occluded native arteries and widely patent grafts.    The patient reports well from cardiovascular standpoint.  He reports his catheterization site has healed.  Blood pressure heart rate are well controlled.  He reports feeling much improved since having better glycemic control.  The patient denies chest pain, shortness of breath, palpitations, fatigue, dizziness, near-syncope, syncope, lower extremity edema, orthopnea or PND.      Current Medications[1]  Allergies[2]  Past Medical History[3]  Past Surgical History[4]  Family Medical History:       Problem Relation (Age of Onset)    Emphysema Father    Heart Attack General Family Hx    Heart Disease Mother, Brother    Hypertension (High Blood Pressure) Mother    No Known Problems Other, Sister, Son, Son    Stroke Mother, Brother            Social History[5]    REVIEW OF SYSTEMS:  Pertinent positives are listed in HPI.     PHYSICAL EXAMINATION:     BP 120/82   Pulse 72   Ht 1.911 m (6' 3.25)   Wt 101 kg (222 lb 3.2 oz)   SpO2 91%   BMI 27.59 kg/m         Physical Exam  Vitals reviewed.   Cardiovascular:      Rate and Rhythm: Normal rate and regular rhythm.   Pulmonary:      Effort: Pulmonary effort is normal.      Breath sounds: Normal breath sounds.   Musculoskeletal:      Right lower leg: No edema.      Left lower  leg: No edema.   Skin:     General: Skin is warm and dry.   Neurological:      Mental Status: He is alert.         ASSESSMENT/PLAN:  1. Coronary artery disease involving native coronary artery of native heart without angina pectoris  Status post CABG.  Patient had a recent catheterization that showed widely patent grafts.  Continue current medical therapy and aggressive risk factor modification.  Catheterization site is healed.    2. Paroxysmal atrial fibrillation (CMS HCC)  Recent Holter monitor revealed no recurrent atrial fibrillation.  He did have 6.2% burden of PVCs.  Encouraged adequate hydration avoidance of caffeine.  Continue metoprolol .  Continue Eliquis  for thromboembolic protection.  He denies bleeding complications.  Report signs or symptoms of bleeding to the office.    3. Essential hypertension  Controlled.  Continue current medical therapy goal blood pressure less than 130/80.    4. Mixed hyperlipidemia  Continue Repatha  goal LDL less than 70.    5. History of DVT of lower extremity  Continue Eliquis .      Return in about 6 months (around 03/25/2025) for [place] . for continued  management of chronic health issues.     Electronically signed by     Chiquita Quinones, PA-C  09/25/2024, 09:37    Any information populated during pre-charting was verified with the patient at the time of the appointment.       This note may have been partially generated using MModal Fluency Direct system, and there may be some incorrect words, spellings, and punctuation that were not noted in checking the note before saving, though effort was made to avoid such errors.          [1]   Current Outpatient Medications   Medication Sig Dispense Refill    alendronate  (FOSAMAX ) 70 mg Oral Tablet TAKE 1 TABLET BY MOUTH EVERY 7 DAYS 12 Tablet 1    amLODIPine  (NORVASC ) 5 mg Oral Tablet Take 2 Tablets (10 mg total) by mouth Daily Indications: high blood pressure (Patient taking differently: Take 1 Tablet (5 mg total) by mouth Daily  Indications: high blood pressure)      apixaban  (ELIQUIS ) 5 mg Oral Tablet Take 1 Tablet (5 mg total) by mouth Twice daily 180 Tablet 0    Blood Glucose Control, Normal Solution Use as directed with glucometer. 1 Each 11    Blood-Glucose Meter (TRUE METRIX GLUCOSE METER) Misc USE TO MONITOR BLOOD SUGAR LEVEL 1 Each 0    calamine Lotion Apply topically Three times a day as needed (Patient not taking: Reported on 09/25/2024) 177 mL 1    Calcium  Carbonate (OS-CAL) 650 mg calcium  (1,625 mg) Oral Tablet Every evening       Cholecalciferol, Vitamin D3, 50 mcg (2,000 unit) Oral Capsule Take by mouth Every evening      citalopram  (CELEXA ) 10 mg Oral Tablet TAKE 1 TABLET BY MOUTH EVERY DAY 90 Tablet 1    cyanocobalamin  (VITAMIN B-12) 500 mcg Oral Tablet Take 1 Tablet (500 mcg total) by mouth Daily      dapagliflozin  propanediol (FARXIGA ) 10 mg Oral Tablet Take 1 Tablet (10 mg total) by mouth Daily Indications: type 2 diabetes mellitus 90 Tablet 1    evolocumab  (REPATHA  SURECLICK) 140 mg/mL Subcutaneous Pen Injector INJECT 1 ML (140 MG) SUBCUTANEOUSLY EVERY 14 DAYS 6 Each 3    famotidine  (PEPCID ) 40 mg Oral Tablet Take 1 Tablet (40 mg total) by mouth Twice daily Indications: gastroesophageal reflux disease 180 Tablet 1    fenofibrate  nanocrystallized (TRICOR ) 145 mg Oral Tablet TAKE 1 TABLET (145 MG TOTAL) BY MOUTH EVERY MORNING WITH BREAKFAST 90 Tablet 1    ferrous sulfate  (FERATAB) 324 mg (65 mg iron ) Oral Tablet, Delayed Release (E.C.) Take 1 Tablet (324 mg total) by mouth      fluticasone  propionate (FLONASE ) 50 mcg/actuation Nasal Spray, Suspension USE 1 SPRAY IN EACH NOSTRIL ONCE A DAY (Patient taking differently: Administer 1 Spray into each nostril Once per day as needed) 48 g 1    folic acid  (FOLVITE ) 1 mg Oral Tablet Take 1 Tablet (1 mg total) by mouth Daily      KRILL OIL ORAL Take by mouth (Patient not taking: Reported on 09/25/2024)      lancets (UNIVERSAL 1 LANCETS) 26 gauge Misc Fingersticks once daily and as  needed. 100 Each 11    losartan  (COZAAR ) 25 mg Oral Tablet TAKE 1 TABLET (25 MG TOTAL) BY MOUTH ONCE A DAY INDICATIONS: HIGH BLOOD PRESSURE 90 Tablet 1    LUTEIN ORAL Take by mouth      melatonin 1 mg Oral Tablet Take 1 Tablet (1 mg total) by mouth  Every night as needed As directed      metoprolol  succinate (TOPROL -XL) 25 mg Oral Tablet Sustained Release 24 hr Take 1 Tablet (25 mg total) by mouth Once a day Indications: high blood pressure (Patient taking differently: Take 1 Tablet (25 mg total) by mouth Every evening Indications: high blood pressure) 90 Tablet 3    nitroGLYCERIN  (NITROSTAT ) 0.4 mg Sublingual Tablet, Sublingual Place 1 Tablet (0.4 mg total) under the tongue Every 5 minutes as needed for Chest pain for 3 doses over 15 minutes 25 Tablet 3    omega-3 fatty acids/fish oil (OMEGA 3 FISH OIL ORAL) Take 1,000 mg by mouth Every evening      silodosin  (RAPAFLO ) 8 mg Oral Capsule Take 1 Capsule (8 mg total) by mouth Daily Indications: enlarged prostate with urination problem 90 Capsule 1    tirzepatide  (MOUNJARO ) 2.5 mg/0.5 mL Subcutaneous Pen Injector Inject 0.5 mL (2.5 mg total) under the skin Every 7 days 6 mL 0    vit B complex no.12/niacin,B3, (VITAMIN B COMPLEX NO.12-NIACIN ORAL) Take 500 mg by mouth (Patient not taking: Reported on 09/25/2024)       No current facility-administered medications for this visit.   [2]   Allergies  Allergen Reactions    Gluten      Celiac disease    Statin [Atorvastatin] Myalgia     All statin medications   [3]   Past Medical History:  Diagnosis Date    Anemia     Atelectasis 12/31/2015    Formatting of this note might be different from the original.  History: post-op  Assessment: bibasilar atelectasis on CXR L>R.  Plan: Encourage ambulation, C&DB, PEP, pain control.      Atrial fibrillation     Blockage of coronary artery of heart (CMS HCC)     BPH associated with nocturia     CAD (coronary artery disease)     Celiac disease     Chest pain 05/07/2018    Chronic depression  05/23/2018    Chronic gout 01/02/2016    Was on allopurinol  but stopped due to CKD   Never had a flare up       CKD (chronic kidney disease)     COVID-19 08/03/2020    Diet-controlled diabetes mellitus (CMS HCC) 05/23/2018    DVT (deep venous thrombosis)     Left    Essential hypertension     Gastroesophageal reflux disease without esophagitis 05/23/2018    History of anesthesia complications     Hypotension    Hyperlipidemia     Insomnia 05/23/2018    Leg cramps, sleep related 05/23/2018    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  A/P  - Previously poorly controlled with fentanyl PCA, improved when switched to dilaudid; continue scheduled lidoderm  patches and tyenol, prn oxycodone; goal pain score <4.      Pericardial effusion     Pulmonary emboli     Sleep apnea     Type 2 diabetes mellitus     Wears glasses    [4]   Past Surgical History:  Procedure Laterality Date    HX CHOLECYSTECTOMY      HX CORONARY ARTERY BYPASS GRAFT      HX HEART CATHETERIZATION      X'S 2    HX HEART SURGERY  12/2015    HX HERNIA REPAIR  Inguinal     HX LIPOMA RESECTION      Chest    HX OTHER      fx right arm- hardware removed    ORIF HIP FRACTURE Left 2024    OTHER SURGICAL HISTORY Left 01/2016    Evacuation of hematoma -Left At[facility]  at [facility]     SKIN CANCER EXCISION  06/21/2013    melanoma/ear/[facility]    TRANSURETHRAL MICROWAVE THERAPY  05/24/2018    Dr Raechel   [5]   Social History  Tobacco Use    Smoking status: Never    Smokeless tobacco: Never   Vaping Use    Vaping status: Never Used   Substance Use Topics    Alcohol use: Not Currently    Drug use: Never

## 2024-09-25 NOTE — Patient Instructions (Signed)
 Continue same medications    Goal BP 100/60 to 130/80    Goal heart rate 60 to 100    Call or go to the ED for chest pain, shortness of breath, or palpitations

## 2024-09-27 ENCOUNTER — Other Ambulatory Visit: Payer: Self-pay

## 2024-09-27 ENCOUNTER — Ambulatory Visit: Attending: Family Medicine

## 2024-09-27 ENCOUNTER — Ambulatory Visit (INDEPENDENT_AMBULATORY_CARE_PROVIDER_SITE_OTHER): Payer: Self-pay | Admitting: FAMILY MEDICINE

## 2024-09-27 DIAGNOSIS — N1831 Chronic kidney disease, stage 3a: Secondary | ICD-10-CM | POA: Insufficient documentation

## 2024-09-27 DIAGNOSIS — E1122 Type 2 diabetes mellitus with diabetic chronic kidney disease: Secondary | ICD-10-CM | POA: Insufficient documentation

## 2024-09-27 LAB — HGA1C (HEMOGLOBIN A1C WITH EST AVG GLUCOSE)
ESTIMATED AVERAGE GLUCOSE: 134 mg/dL
HEMOGLOBIN A1C: 6.3 % — ABNORMAL HIGH (ref <5.7)

## 2024-10-01 ENCOUNTER — Other Ambulatory Visit (INDEPENDENT_AMBULATORY_CARE_PROVIDER_SITE_OTHER): Payer: Self-pay | Admitting: FAMILY MEDICINE

## 2024-10-01 DIAGNOSIS — K219 Gastro-esophageal reflux disease without esophagitis: Secondary | ICD-10-CM

## 2024-10-01 NOTE — Telephone Encounter (Signed)
 Last scheduled appointment with you was 06/27/2024.  Currently scheduled next future appointment with you is 10/02/2024  The next office visit in this department is 10/02/2024    Clent Screws, LPN  [id number]/[id number], 09:24

## 2024-10-02 ENCOUNTER — Other Ambulatory Visit (INDEPENDENT_AMBULATORY_CARE_PROVIDER_SITE_OTHER): Payer: Self-pay | Admitting: FAMILY MEDICINE

## 2024-10-02 ENCOUNTER — Ambulatory Visit: Payer: Self-pay | Attending: FAMILY MEDICINE | Admitting: FAMILY MEDICINE

## 2024-10-02 ENCOUNTER — Encounter (INDEPENDENT_AMBULATORY_CARE_PROVIDER_SITE_OTHER): Payer: Self-pay | Admitting: FAMILY MEDICINE

## 2024-10-02 ENCOUNTER — Other Ambulatory Visit: Payer: Self-pay

## 2024-10-02 VITALS — BP 110/68 | HR 82 | Temp 97.3°F | Resp 16 | Ht 75.25 in | Wt 220.0 lb

## 2024-10-02 DIAGNOSIS — Z7984 Long term (current) use of oral hypoglycemic drugs: Secondary | ICD-10-CM

## 2024-10-02 DIAGNOSIS — Z6827 Body mass index (BMI) 27.0-27.9, adult: Secondary | ICD-10-CM

## 2024-10-02 DIAGNOSIS — R1084 Generalized abdominal pain: Secondary | ICD-10-CM

## 2024-10-02 DIAGNOSIS — E1122 Type 2 diabetes mellitus with diabetic chronic kidney disease: Secondary | ICD-10-CM | POA: Insufficient documentation

## 2024-10-02 DIAGNOSIS — Z7985 Long-term (current) use of injectable non-insulin antidiabetic drugs: Secondary | ICD-10-CM

## 2024-10-02 DIAGNOSIS — R197 Diarrhea, unspecified: Secondary | ICD-10-CM

## 2024-10-02 DIAGNOSIS — N1831 Chronic kidney disease, stage 3a: Secondary | ICD-10-CM | POA: Insufficient documentation

## 2024-10-02 DIAGNOSIS — E669 Obesity, unspecified: Secondary | ICD-10-CM

## 2024-10-02 MED ORDER — ONETOUCH VERIO TEST STRIPS
1.0000 | ORAL_STRIP | Freq: Every day | 0 refills | Status: DC
Start: 2024-10-02 — End: 2024-10-02

## 2024-10-02 NOTE — Progress Notes (Signed)
 [facility]  [place]  [address] [place] [address]  Operated by [facility]  Progress Note    Name: Dustin Webster MRN:  [id number]   Date: 10/02/2024 DOB:  [birth year] (68 y.o.)             Reason for Visit: Follow Up 3 Months ( 3 mth follow up for diabetes and other chronic conditions.)    History of Present Illness  JAYSHON Webster is a 68 y.o. male who is being seen today for diabetes follow up.     History of Present Illness  Dustin Webster is a 68 year old male with type 2 diabetes mellitus who presents for a three-month diabetic follow-up.    His recent A1c was 6.2, and three months ago it was 8.5. His current medications include Mounjaro  2.5 mg weekly and Farxiga  10 mg daily. He experiences diarrhea two to three times a week as a side effect of Mounjaro , which is tolerable compared to severe diarrhea with a previous medication. He has lost 10 pounds since starting Mounjaro  and has made significant dietary changes.    He reports longstanding abdominal pain that has been present for about a month, located in the same spot as a pain he experienced 50 years ago. The pain is described as a dull ache, rated 2-3 out of 10, and does not change with bowel movements. No constipation, fever, chills, nausea, or vomiting. 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 He started a probiotic, which has helped with diarrhea.    He mentions an issue with diabetes supplies, specifically needing OneTouch test strips for his existing meter. He has plenty of lancets and does not require a new meter.     No problems updated.    Past Medical History[1]    Past Surgical History[2]    Medications  Current Medications[3]    Allergies[4]    Family Medical History:       Problem Relation (Age of Onset)    Emphysema Father    Heart Attack General Family Hx    Heart Disease Mother, Brother    Hypertension (High Blood Pressure) Mother    No Known Problems Other,  Sister, Son, Son    Stroke Mother, Brother              Social History[5]    Review of Systems  Pertinent positives are listed in HPI    Physical Exam:  BP 110/68 (Site: Right Arm, Patient Position: Sitting, Cuff Size: Adult)   Pulse 82   Temp 36.3 C (97.3 F) (Temporal)   Resp 16   Ht 1.911 m (6' 3.25)   Wt 99.8 kg (220 lb)   SpO2 97%   BMI 27.32 kg/m       Physical Exam  Constitutional:       Appearance: Normal appearance.   Cardiovascular:      Rate and Rhythm: Normal rate and regular rhythm.      Pulses: Normal pulses.      Heart sounds: Normal heart sounds. No murmur heard.     No gallop.   Pulmonary:      Effort: Pulmonary effort is normal. No respiratory distress.      Breath sounds: Normal breath sounds. No wheezing or rales.   Abdominal:      General: Abdomen is flat. Bowel sounds are normal. There is no distension.  Palpations: Abdomen is soft.      Tenderness: There is no abdominal tenderness.   Skin:     General: Skin is warm and dry.      Coloration: Skin is not jaundiced.   Neurological:      General: No focal deficit present.      Mental Status: He is alert and oriented to person, place, and time. Mental status is at baseline.   Psychiatric:         Mood and Affect: Mood normal.         Behavior: Behavior normal.          Results:  Results  LABS  A1c: 6.2 (09/25/2024)  A1c: 8.5 (06/25/2024)                Assessment/Plan:    Problem List Items Addressed This Visit          Nephrology    Type 2 diabetes mellitus with diabetic chronic kidney disease - Primary      Assessment & Plan  Type 2 diabetes mellitus with diabetic chronic kidney disease, stable on Mounjaro  and Farxiga   Diabetes well-controlled with A1c of 6.3. Mounjaro  effective with tolerable diarrhea. Farxiga  used for cardiac benefits. Weight loss noted. No dose increase needed.  - Continue Mounjaro  2.5 mg weekly.  - Continue Farxiga  10 mg daily.  - Ordered A1c test in three months.  - Advised to stop daily blood glucose  monitoring unless symptoms suggest hypoglycemia or hyperglycemia.  - Provided OneTouch Vero test strips.    Chronic kidney disease, stage 3b  Avoid contrast agents for imaging.  - Avoid contrast agents for imaging.    Medication-related diarrhea  Diarrhea 2-3 times a week, likely from Mounjaro . Symptoms tolerable. Dietary modifications recommended.  - Advised smaller, more frequent meals to manage diarrhea.  - Continue probiotics to help with diarrhea.    Chronic, stable abdominal pain  Chronic mild abdominal pain, not typical for diverticulitis. No immediate imaging needed unless symptoms worsen.  - Monitor symptoms and report if pain worsens.  - Will consider CT scan if pain worsens or symptoms change.    Morbid obesity due to excess calories, improved  Current BMI slightly elevated. Dietary changes effective.  - Continue current dietary modifications.      Orders Placed This Encounter    Blood Sugar Diagnostic (ONETOUCH VERIO TEST STRIPS) Strip      Medications Discontinued During This Encounter   Medication Reason    calamine Lotion     KRILL OIL ORAL          Follow up: No follow-ups on file.    Patient has been seen in this clinic within the last 3 years.     Dustin Sauer, MD     This note was created with assistance from [employer] via capture of conversational audio.  Consent was obtained from the patient prior to recording.           [1]   Past Medical History:  Diagnosis Date    Anemia     Atelectasis 12/31/2015    Formatting of this note might be different from the original.  History: post-op  Assessment: bibasilar atelectasis on CXR L>R.  Plan: Encourage ambulation, C&DB, PEP, pain control.      Atrial fibrillation     Blockage of coronary artery of heart (CMS HCC)     BPH associated with nocturia     CAD (coronary artery disease)     Celiac disease  Chest pain 05/07/2018    Chronic depression 05/23/2018    Chronic gout 01/02/2016    Was on allopurinol  but stopped due to CKD   Never had a flare up        CKD (chronic kidney disease)     COVID-19 08/03/2020    Diet-controlled diabetes mellitus (CMS HCC) 05/23/2018    DVT (deep venous thrombosis)     Left    Essential hypertension     Gastroesophageal reflux disease without esophagitis 05/23/2018    History of anesthesia complications     Hypotension    Hyperlipidemia     Insomnia 05/23/2018    Leg cramps, sleep related 05/23/2018    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  A/P  - Previously poorly controlled with fentanyl PCA, improved when switched to dilaudid; continue scheduled lidoderm  patches and tyenol, prn oxycodone; goal pain score <4.      Pericardial effusion     Pulmonary emboli     Sleep apnea     Type 2 diabetes mellitus     Wears glasses    [2]   Past Surgical History:  Procedure Laterality Date    HX CHOLECYSTECTOMY      HX CORONARY ARTERY BYPASS GRAFT      HX HEART CATHETERIZATION      X'S 2    HX HEART SURGERY  12/2015    HX HERNIA REPAIR      Inguinal     HX LIPOMA RESECTION      Chest    HX OTHER      fx right arm- hardware removed    ORIF HIP FRACTURE Left 2024    OTHER SURGICAL HISTORY Left 01/2016    Evacuation of hematoma -Left Atrium  at [facility]     SKIN CANCER EXCISION  06/21/2013    melanoma/ear/[facility]    TRANSURETHRAL MICROWAVE THERAPY  05/24/2018    Dr Raechel   [3]   Current Outpatient Medications   Medication Sig Dispense Refill    alendronate  (FOSAMAX ) 70 mg Oral Tablet TAKE 1 TABLET BY MOUTH EVERY 7 DAYS 12 Tablet 1    amLODIPine  (NORVASC ) 10 mg Oral Tablet TAKE 1 TABLET BY MOUTH EVERY DAY 90 Tablet 1    apixaban  (ELIQUIS ) 5 mg Oral Tablet Take 1 Tablet (5 mg total) by mouth Twice daily 180 Tablet 0    Blood Glucose Control, Normal Solution Use as directed with glucometer. 1 Each 11    Blood Sugar Diagnostic (ONETOUCH VERIO TEST STRIPS) Strip 1 Each Daily 100 Each 0    Blood-Glucose Meter (TRUE METRIX GLUCOSE METER) Misc USE TO  MONITOR BLOOD SUGAR LEVEL 1 Each 0    Calcium  Carbonate (OS-CAL) 650 mg calcium  (1,625 mg) Oral Tablet Every evening       Cholecalciferol, Vitamin D3, 50 mcg (2,000 unit) Oral Capsule Take by mouth Every evening      citalopram  (CELEXA ) 10 mg Oral Tablet TAKE 1 TABLET BY MOUTH EVERY DAY 90 Tablet 1    cyanocobalamin  (VITAMIN B-12) 500 mcg Oral Tablet Take 1 Tablet (500 mcg total) by mouth Daily      dapagliflozin  propanediol (FARXIGA ) 10 mg Oral Tablet Take 1 Tablet (10 mg total) by mouth Daily Indications: type 2 diabetes mellitus 90 Tablet 1    evolocumab  (REPATHA  SURECLICK) 140 mg/mL Subcutaneous Pen Injector INJECT 1 ML (140 MG) SUBCUTANEOUSLY  EVERY 14 DAYS 6 Each 3    famotidine  (PEPCID ) 40 mg Oral Tablet TAKE 1 TABLET (40 MG TOTAL) BY MOUTH TWICE DAILY INDICATIONS: GASTROESOPHAGEAL REFLUX DISEASE 180 Tablet 1    fenofibrate  nanocrystallized (TRICOR ) 145 mg Oral Tablet TAKE 1 TABLET (145 MG TOTAL) BY MOUTH EVERY MORNING WITH BREAKFAST 90 Tablet 1    ferrous sulfate  (FERATAB) 324 mg (65 mg iron ) Oral Tablet, Delayed Release (E.C.) Take 1 Tablet (324 mg total) by mouth      fluticasone  propionate (FLONASE ) 50 mcg/actuation Nasal Spray, Suspension USE 1 SPRAY IN EACH NOSTRIL ONCE A DAY (Patient taking differently: Administer 1 Spray into each nostril Once per day as needed) 48 g 1    folic acid  (FOLVITE ) 1 mg Oral Tablet Take 1 Tablet (1 mg total) by mouth Daily      lancets (UNIVERSAL 1 LANCETS) 26 gauge Misc Fingersticks once daily and as needed. 100 Each 11    losartan  (COZAAR ) 25 mg Oral Tablet TAKE 1 TABLET (25 MG TOTAL) BY MOUTH ONCE A DAY INDICATIONS: HIGH BLOOD PRESSURE 90 Tablet 1    LUTEIN ORAL Take by mouth      melatonin 1 mg Oral Tablet Take 1 Tablet (1 mg total) by mouth Every night as needed As directed      metoprolol  succinate (TOPROL -XL) 25 mg Oral Tablet Sustained Release 24 hr Take 1 Tablet (25 mg total) by mouth Once a day Indications: high blood pressure (Patient taking differently: Take 1  Tablet (25 mg total) by mouth Every evening Indications: high blood pressure) 90 Tablet 3    nitroGLYCERIN  (NITROSTAT ) 0.4 mg Sublingual Tablet, Sublingual Place 1 Tablet (0.4 mg total) under the tongue Every 5 minutes as needed for Chest pain for 3 doses over 15 minutes 25 Tablet 3    omega-3 fatty acids/fish oil (OMEGA 3 FISH OIL ORAL) Take 1,000 mg by mouth Every evening      silodosin  (RAPAFLO ) 8 mg Oral Capsule Take 1 Capsule (8 mg total) by mouth Daily Indications: enlarged prostate with urination problem 90 Capsule 1    tirzepatide  (MOUNJARO ) 2.5 mg/0.5 mL Subcutaneous Pen Injector Inject 0.5 mL (2.5 mg total) under the skin Every 7 days 6 mL 0    vit B complex no.12/niacin,B3, (VITAMIN B COMPLEX NO.12-NIACIN ORAL) Take 500 mg by mouth (Patient not taking: Reported on 09/25/2024)       No current facility-administered medications for this visit.   [4]   Allergies  Allergen Reactions    Gluten      Celiac disease    Statin [Atorvastatin] Myalgia     All statin medications   [5]   Social History  Tobacco Use    Smoking status: Never    Smokeless tobacco: Never   Vaping Use    Vaping status: Never Used   Substance Use Topics    Alcohol use: Not Currently    Drug use: Never

## 2024-10-02 NOTE — Nursing Note (Signed)
 10/02/24 1301   PHQ 9 (follow up)   Little interest or pleasure in doing things. 0   Feeling down, depressed, or hopeless 0   Trouble falling or staying asleep, or sleeping too much. 1   Feeling tired or having little energy 2   Poor appetite or overeating 0   Feeling bad about yourself/ that you are a failure in the past 2 weeks? 0   Trouble concentrating on things in the past 2 weeks? 0   Moving/Speaking slowly or being fidgety or restless  in the past 2 weeks? 0   Thoughts that you would be better off DEAD, or of hurting yourself in some way. 0   If you checked off any problems, how difficult have these problems made it for you to do your work, take care of things at home, or get along with other people? Not difficult at all   PHQ 9 Total 3

## 2024-10-02 NOTE — Nursing Note (Signed)
 10/02/24 1300   Domestic Violence   Because we are aware of abuse and domestic violence today, we ask all patients: Are you being hurt, hit, or frightened by anyone at your home or in your life?  N   Basic Needs   Do you have any basic needs within your home that are not being met? (such as Food, Shelter, [employer], Tranportation, paying for bills and/or medications) N   Advanced Directives   Do you have any advanced directives? No Advance   Would you like an advanced directive packet? Accepted Packet

## 2024-10-02 NOTE — Telephone Encounter (Signed)
 Last scheduled appointment with you was 10/02/2024.  Currently scheduled future appointment is Visit date not found.      Ginny Ben, CMA  10/02/2024, 16:22

## 2024-10-08 ENCOUNTER — Other Ambulatory Visit (INDEPENDENT_AMBULATORY_CARE_PROVIDER_SITE_OTHER): Payer: Self-pay | Admitting: FAMILY MEDICINE

## 2024-10-08 ENCOUNTER — Encounter (INDEPENDENT_AMBULATORY_CARE_PROVIDER_SITE_OTHER): Payer: Self-pay | Admitting: FAMILY MEDICINE

## 2024-10-08 DIAGNOSIS — E1122 Type 2 diabetes mellitus with diabetic chronic kidney disease: Secondary | ICD-10-CM

## 2024-10-08 MED ORDER — MOUNJARO 2.5 MG/0.5 ML SUBCUTANEOUS PEN INJECTOR: 2.5000 mg | PEN_INJECTOR | SUBCUTANEOUS | 0 refills | Status: DC

## 2024-10-08 NOTE — Telephone Encounter (Signed)
 Tanda BIRCH Dornfeld to [facility] [name] Clinical Support (supporting [name], MD) (Selected Message)        10/08/24 12:57 PM  I am on my last dose of Monujaro.  The system will not let me request a refill.  Please place an prescription for 90 day supply with refills since I will be on it long term.    THANKS

## 2024-10-15 ENCOUNTER — Other Ambulatory Visit (INDEPENDENT_AMBULATORY_CARE_PROVIDER_SITE_OTHER): Payer: Self-pay | Admitting: FAMILY MEDICINE

## 2024-10-15 DIAGNOSIS — N1831 Chronic kidney disease, stage 3a: Secondary | ICD-10-CM

## 2024-10-16 ENCOUNTER — Other Ambulatory Visit (INDEPENDENT_AMBULATORY_CARE_PROVIDER_SITE_OTHER): Payer: Self-pay | Admitting: FAMILY MEDICINE

## 2024-10-16 DIAGNOSIS — E1122 Type 2 diabetes mellitus with diabetic chronic kidney disease: Secondary | ICD-10-CM

## 2024-10-16 NOTE — Telephone Encounter (Signed)
 Ozempic  was discontinued due to side effects.   Alternative was sent as Mounjaro .       This refill was denied due to information above.     Dewitte Vannice, LPN

## 2024-10-25 ENCOUNTER — Telehealth (INDEPENDENT_AMBULATORY_CARE_PROVIDER_SITE_OTHER): Payer: Self-pay | Admitting: FAMILY MEDICINE

## 2024-10-25 NOTE — Telephone Encounter (Signed)
 Prior shara was filled out by paper and faxed. Will submit prior auth for Mounjaro  through Cover My Meds.    [address] [place], [place]  10/25/2024, 15:53    Dustin Webster to [facility] Dustin Webster Clinical Support (supporting Dustin Sauer, MD) (Selected Message)        10/25/24  3:02 PM  Hi, has the insurance company replied to the preauthorization?  If not please contact them and see if the request has been misplaced.   If they are not going to approve it,  please ask Dr. Sauer to prescribe me the long lasting insulin  like she did a month or two ago before I tried the Monujaro.  THANKS.  Dustin Webster, CMA to Dustin Webster  [facility]      10/17/24  9:20 AM  I've submitted the prior authorization. We will now just have to wait for a reply from your insurance if it is approved or denied. Once we get an answer from them, we will go from there.      If you have any other questions or concerns, please let us  know.   Thank you!    Last read by Dustin BIRCH Dearth at 2:59PM on 10/25/2024.  Dustin BIRCH Dipalma to [facility] [employer] Dustin Webster Clinical Support (supporting Dustin Sauer, MD)        10/16/24  4:24 PM  OK,  I checked with the pharmacy again and they said it has to have a prior authorization.   I previously tried Ozempic   and Metforman and cannot tolerate them.  Please follow up.  Dustin Tomasa Olives, LPN to Dustin Webster  [place]      10/16/24  3:54 PM  This was sent to [facility] for you on 11/25    Last read by Dustin BIRCH Dearth at 2:59PM on 10/25/2024.  Dustin Webster to [facility] [employer] Dustin Webster Clinical Support (supporting Dustin Sauer, MD)        10/16/24  3:39 PM  Hello,  I am now behind on my Monujaro.  I contacted my pharmacy and there are no refills on my previous prescription.  Please take some action on my note above.  Dustin Webster to [facility] [employer] Dustin Webster Clinical Support (supporting Dustin Sauer, MD)        10/08/24 12:57 PM  I am on my last dose of Monujaro.  The system will not let me request a refill.  Please place an prescription for 90  day supply with refills since I will be on it long term.    THANKS

## 2024-10-29 NOTE — Telephone Encounter (Signed)
 Prior auth approved. Pt notified by [employer]. Pt reviewed MyChart message.     [address] [place], [place]  10/29/2024, 07:04    Me to RADAMES MEJORADO  AO      10/28/24  7:11 AM  Tanda Richard News!!!! I just received the approval from your insurance for the Mounjaro . Please contact your pharmacy and have them run the prescription again. Have a great day!!!     Thanks,  Hospital Doctor, CMA    Last read by Tanda JONETTA Dearth at 11:01AM on 10/28/2024.  Tanda JONETTA Likes to [facility] [name] Clinical Support (supporting [name], MD)        10/28/24  7:21 AM  Thank you. Merry Christmas

## 2024-11-16 ENCOUNTER — Other Ambulatory Visit (INDEPENDENT_AMBULATORY_CARE_PROVIDER_SITE_OTHER): Payer: Self-pay | Admitting: FAMILY MEDICINE

## 2024-11-16 DIAGNOSIS — N401 Enlarged prostate with lower urinary tract symptoms: Secondary | ICD-10-CM

## 2024-11-18 NOTE — Telephone Encounter (Signed)
 Last scheduled appointment with you was 10/02/2024.  Currently scheduled future appointment is 04/01/2025.      Ginny Ben, CMA  11/18/2024, 10:49

## 2024-11-24 ENCOUNTER — Other Ambulatory Visit (INDEPENDENT_AMBULATORY_CARE_PROVIDER_SITE_OTHER): Payer: Self-pay | Admitting: FAMILY MEDICINE

## 2024-11-24 DIAGNOSIS — N138 Other obstructive and reflux uropathy: Secondary | ICD-10-CM

## 2024-11-24 DIAGNOSIS — N1831 Chronic kidney disease, stage 3a: Secondary | ICD-10-CM

## 2024-11-25 NOTE — Telephone Encounter (Signed)
 Last scheduled appointment with you was 10/02/2024.    Currently scheduled future appointment is 04/01/2025.    [address] [place], [place]  11/25/2024, 07:53

## 2024-11-26 MED ORDER — MOUNJARO 2.5 MG/0.5 ML SUBCUTANEOUS PEN INJECTOR: 2.5000 mg | PEN_INJECTOR | SUBCUTANEOUS | 0 refills | Status: AC

## 2024-12-13 ENCOUNTER — Other Ambulatory Visit: Payer: Self-pay

## 2024-12-13 ENCOUNTER — Ambulatory Visit: Attending: FAMILY MEDICINE

## 2024-12-13 DIAGNOSIS — N1831 Chronic kidney disease, stage 3a: Secondary | ICD-10-CM | POA: Insufficient documentation

## 2024-12-13 LAB — URINALYSIS, MACROSCOPIC
BILIRUBIN: NEGATIVE mg/dL
BLOOD: NEGATIVE mg/dL
GLUCOSE: 500 mg/dL — AB
KETONES: NEGATIVE mg/dL
LEUKOCYTE ESTERASE: NEGATIVE WBCs/uL
NITRITE: NEGATIVE
PH: 6 (ref 5.0–8.0)
PROTEIN: NEGATIVE mg/dL
SPECIFIC GRAVITY: 1.025 (ref 1.005–1.030)
UROBILINOGEN: 0.2 mg/dL

## 2024-12-13 LAB — COMPREHENSIVE METABOLIC PANEL, NON-FASTING
ALBUMIN: 4.7 g/dL (ref 3.5–5.0)
ALKALINE PHOSPHATASE: 28 U/L — ABNORMAL LOW (ref 38–126)
ALT (SGPT): 27 U/L (ref <=50)
ANION GAP: 11 mmol/L
AST (SGOT): 27 U/L (ref 17–59)
BILIRUBIN TOTAL: 0.6 mg/dL (ref 0.2–1.3)
BUN/CREA RATIO: 21
BUN: 33 mg/dL — ABNORMAL HIGH (ref 9–20)
CALCIUM: 10.3 mg/dL — ABNORMAL HIGH (ref 8.4–10.2)
CHLORIDE: 105 mmol/L (ref 98–107)
CO2 TOTAL: 26 mmol/L (ref 22–30)
CREATININE: 1.57 mg/dL — ABNORMAL HIGH (ref 0.66–1.25)
ESTIMATED GFR: 47 mL/min/{1.73_m2} — ABNORMAL LOW (ref >=60)
GLUCOSE: 106 mg/dL (ref 74–106)
POTASSIUM: 4.3 mmol/L (ref 3.5–5.1)
PROTEIN TOTAL: 7.4 g/dL (ref 6.3–8.2)
SODIUM: 142 mmol/L (ref 137–145)

## 2024-12-13 LAB — URIC ACID: URIC ACID: 5.1 mg/dL (ref 3.5–8.5)

## 2024-12-13 LAB — CBC WITH DIFF
BASOPHIL #: <0.1 10*3/uL (ref <=0.20)
BASOPHIL %: 1.3 %
EOSINOPHIL #: 0.31 10*3/uL (ref <=0.50)
EOSINOPHIL %: 5.6 %
HCT: 48 % (ref 38.9–52.0)
HGB: 15.6 g/dL (ref 13.4–17.5)
IMMATURE GRANULOCYTE #: <0.1 10*3/uL (ref <0.10)
IMMATURE GRANULOCYTE %: 0.5 % (ref 0.0–1.0)
LYMPHOCYTE #: 1.96 10*3/uL (ref 1.00–4.80)
LYMPHOCYTE %: 35.2 %
MCH: 28.7 pg (ref 26.0–32.0)
MCHC: 32.5 g/dL (ref 31.0–35.5)
MCV: 88.2 fL (ref 78.0–100.0)
MONOCYTE #: 0.57 10*3/uL (ref 0.20–1.10)
MONOCYTE %: 10.2 %
MPV: 11.8 fL (ref 8.7–12.5)
NEUTROPHIL #: 2.63 10*3/uL (ref 1.50–7.70)
NEUTROPHIL %: 47.2 %
PLATELETS: 202 10*3/uL (ref 150–400)
RBC: 5.44 10*6/uL (ref 4.50–6.10)
RDW-CV: 14.3 % (ref 11.5–15.5)
WBC: 5.6 10*3/uL (ref 3.7–11.0)

## 2024-12-13 LAB — MAGNESIUM: MAGNESIUM: 2.1 mg/dL (ref 1.6–2.3)

## 2024-12-13 LAB — IRON TRANSFERRIN AND TIBC
IRON (TRANSFERRIN) SATURATION: 26 % (ref 20–55)
IRON: 102 ug/dL (ref 49–181)
TOTAL IRON BINDING CAPACITY: 392 ug/dL (ref 261–462)

## 2024-12-13 LAB — PROTEIN/CREATININE RATIO, URINE, RANDOM
CREATININE RANDOM URINE: 162 mg/dL (ref 100–200)
PROTEIN RANDOM URINE: 8 mg/dL (ref 0–12)
PROTEIN/CREATININE RATIO RANDOM URINE: 0.049 mg/mg (ref 0.000–0.150)

## 2024-12-13 LAB — VITAMIN D 25 TOTAL: VITAMIN D 25, TOTAL: 52.2 ng/mL (ref 30.00–100.00)

## 2024-12-13 LAB — PHOSPHORUS: PHOSPHORUS: 3.9 mg/dL (ref 2.5–4.5)

## 2024-12-13 LAB — MICROALBUMIN/CREATININE RATIO, URINE, RANDOM
CREATININE RANDOM URINE: 162 mg/dL (ref 100–200)
MICROALBUMIN RANDOM URINE: 0.8 mg/dL (ref 0.0–1.7)
MICROALBUMIN/CREATININE RATIO RANDOM URINE: 4.9 mg/g (ref 0.0–30.0)

## 2024-12-13 LAB — PARATHYROID HORMONE (PTH): PTH: 23.8 pg/mL (ref 7.5–53.5)

## 2024-12-13 LAB — FERRITIN: FERRITIN: 159 ng/mL (ref 18–464)

## 2024-12-19 ENCOUNTER — Ambulatory Visit (INDEPENDENT_AMBULATORY_CARE_PROVIDER_SITE_OTHER): Payer: Self-pay

## 2024-12-26 ENCOUNTER — Ambulatory Visit (INDEPENDENT_AMBULATORY_CARE_PROVIDER_SITE_OTHER): Payer: Self-pay | Admitting: Internal Medicine

## 2024-12-31 ENCOUNTER — Ambulatory Visit (INDEPENDENT_AMBULATORY_CARE_PROVIDER_SITE_OTHER)

## 2025-04-01 ENCOUNTER — Ambulatory Visit (INDEPENDENT_AMBULATORY_CARE_PROVIDER_SITE_OTHER): Payer: Self-pay | Admitting: FAMILY MEDICINE
